# Patient Record
Sex: Female | Born: 1945 | Race: Black or African American | Hispanic: No | Marital: Single | Smoking: Former smoker
Health system: Southern US, Community
[De-identification: ages and names within clinical notes are randomized; demographics above are authoritative.]

## PROBLEM LIST (undated history)

## (undated) DIAGNOSIS — N841 Polyp of cervix uteri: Secondary | ICD-10-CM

## (undated) DIAGNOSIS — R221 Localized swelling, mass and lump, neck: Secondary | ICD-10-CM

## (undated) DIAGNOSIS — I2510 Atherosclerotic heart disease of native coronary artery without angina pectoris: Secondary | ICD-10-CM

## (undated) DIAGNOSIS — Z860100 Personal history of colon polyps, unspecified: Secondary | ICD-10-CM

## (undated) DIAGNOSIS — F4310 Post-traumatic stress disorder, unspecified: Secondary | ICD-10-CM

## (undated) DIAGNOSIS — I701 Atherosclerosis of renal artery: Secondary | ICD-10-CM

## (undated) DIAGNOSIS — I10 Essential (primary) hypertension: Secondary | ICD-10-CM

## (undated) DIAGNOSIS — G4733 Obstructive sleep apnea (adult) (pediatric): Secondary | ICD-10-CM

## (undated) DIAGNOSIS — D128 Benign neoplasm of rectum: Secondary | ICD-10-CM

## (undated) DIAGNOSIS — K429 Umbilical hernia without obstruction or gangrene: Secondary | ICD-10-CM

## (undated) DIAGNOSIS — E039 Hypothyroidism, unspecified: Secondary | ICD-10-CM

## (undated) DIAGNOSIS — F329 Major depressive disorder, single episode, unspecified: Secondary | ICD-10-CM

## (undated) DIAGNOSIS — R590 Localized enlarged lymph nodes: Secondary | ICD-10-CM

## (undated) DIAGNOSIS — N390 Urinary tract infection, site not specified: Secondary | ICD-10-CM

## (undated) DIAGNOSIS — E119 Type 2 diabetes mellitus without complications: Secondary | ICD-10-CM

## (undated) DIAGNOSIS — E785 Hyperlipidemia, unspecified: Secondary | ICD-10-CM

## (undated) DIAGNOSIS — G459 Transient cerebral ischemic attack, unspecified: Secondary | ICD-10-CM

## (undated) DIAGNOSIS — M549 Dorsalgia, unspecified: Secondary | ICD-10-CM

## (undated) DIAGNOSIS — F32A Depression, unspecified: Secondary | ICD-10-CM

## (undated) DIAGNOSIS — I639 Cerebral infarction, unspecified: Secondary | ICD-10-CM

## (undated) HISTORY — DX: Umbilical hernia without obstruction or gangrene: K42.9

## (undated) HISTORY — DX: Hypothyroidism, unspecified: E03.9

## (undated) HISTORY — DX: Essential (primary) hypertension: I10

## (undated) HISTORY — PX: UMBILICAL HERNIA REPAIR: SHX196

## (undated) HISTORY — DX: Post-traumatic stress disorder, unspecified: F43.10

## (undated) HISTORY — DX: Polyp of cervix uteri: N84.1

## (undated) HISTORY — DX: Obstructive sleep apnea (adult) (pediatric): G47.33

## (undated) HISTORY — DX: Cerebral infarction, unspecified: I63.9

## (undated) HISTORY — PX: ANKLE SURGERY: SHX546

## (undated) HISTORY — PX: APPENDECTOMY: SHX54

## (undated) HISTORY — DX: Atherosclerotic heart disease of native coronary artery without angina pectoris: I25.10

## (undated) HISTORY — DX: Localized enlarged lymph nodes: R59.0

## (undated) HISTORY — DX: Urinary tract infection, site not specified: N39.0

## (undated) HISTORY — PX: CARDIAC CATHETERIZATION: SHX172

## (undated) HISTORY — PX: CORONARY ANGIOPLASTY: SHX604

## (undated) HISTORY — DX: Atherosclerosis of renal artery: I70.1

## (undated) HISTORY — DX: Localized swelling, mass and lump, neck: R22.1

## (undated) HISTORY — DX: Benign neoplasm of rectum: D12.8

## (undated) HISTORY — DX: Hyperlipidemia, unspecified: E78.5

---
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
## 1996-02-05 HISTORY — PX: CORONARY STENT PLACEMENT: SHX1402

## 1998-09-14 ENCOUNTER — Ambulatory Visit (HOSPITAL_COMMUNITY): Admission: RE | Admit: 1998-09-14 | Discharge: 1998-09-14 | Payer: Self-pay | Admitting: Family Medicine

## 1998-09-14 ENCOUNTER — Encounter: Payer: Self-pay | Admitting: Family Medicine

## 1999-07-13 ENCOUNTER — Encounter: Payer: Self-pay | Admitting: Emergency Medicine

## 1999-07-13 ENCOUNTER — Inpatient Hospital Stay (HOSPITAL_COMMUNITY): Admission: EM | Admit: 1999-07-13 | Discharge: 1999-07-15 | Payer: Self-pay | Admitting: Emergency Medicine

## 1999-07-13 ENCOUNTER — Encounter: Payer: Self-pay | Admitting: Orthopedic Surgery

## 1999-09-07 ENCOUNTER — Encounter: Admission: RE | Admit: 1999-09-07 | Discharge: 1999-10-25 | Payer: Self-pay | Admitting: Orthopedic Surgery

## 2000-06-27 ENCOUNTER — Other Ambulatory Visit: Admission: RE | Admit: 2000-06-27 | Discharge: 2000-06-27 | Payer: Self-pay | Admitting: Family Medicine

## 2003-02-11 ENCOUNTER — Ambulatory Visit (HOSPITAL_COMMUNITY): Admission: RE | Admit: 2003-02-11 | Discharge: 2003-02-11 | Payer: Self-pay | Admitting: Family Medicine

## 2003-08-19 ENCOUNTER — Inpatient Hospital Stay (HOSPITAL_COMMUNITY): Admission: EM | Admit: 2003-08-19 | Discharge: 2003-08-22 | Payer: Self-pay | Admitting: Emergency Medicine

## 2003-09-07 ENCOUNTER — Encounter: Admission: RE | Admit: 2003-09-07 | Discharge: 2003-09-07 | Payer: Self-pay | Admitting: Internal Medicine

## 2003-09-21 ENCOUNTER — Encounter: Admission: RE | Admit: 2003-09-21 | Discharge: 2003-09-21 | Payer: Self-pay | Admitting: Internal Medicine

## 2003-10-16 ENCOUNTER — Emergency Department (HOSPITAL_COMMUNITY): Admission: EM | Admit: 2003-10-16 | Discharge: 2003-10-16 | Payer: Self-pay | Admitting: Emergency Medicine

## 2004-01-26 ENCOUNTER — Ambulatory Visit: Payer: Self-pay | Admitting: Internal Medicine

## 2004-01-26 ENCOUNTER — Inpatient Hospital Stay (HOSPITAL_COMMUNITY): Admission: AD | Admit: 2004-01-26 | Discharge: 2004-01-28 | Payer: Self-pay | Admitting: Internal Medicine

## 2004-02-15 ENCOUNTER — Ambulatory Visit: Payer: Self-pay | Admitting: Internal Medicine

## 2004-03-26 ENCOUNTER — Ambulatory Visit (HOSPITAL_COMMUNITY): Admission: RE | Admit: 2004-03-26 | Discharge: 2004-03-26 | Payer: Self-pay | Admitting: Cardiovascular Disease

## 2004-08-30 ENCOUNTER — Ambulatory Visit (HOSPITAL_COMMUNITY): Admission: RE | Admit: 2004-08-30 | Discharge: 2004-08-30 | Payer: Self-pay | Admitting: Internal Medicine

## 2004-08-30 ENCOUNTER — Ambulatory Visit: Payer: Self-pay | Admitting: Internal Medicine

## 2004-09-05 ENCOUNTER — Ambulatory Visit: Payer: Self-pay | Admitting: Internal Medicine

## 2004-09-05 ENCOUNTER — Ambulatory Visit (HOSPITAL_COMMUNITY): Admission: RE | Admit: 2004-09-05 | Discharge: 2004-09-05 | Payer: Self-pay | Admitting: Internal Medicine

## 2004-09-28 ENCOUNTER — Ambulatory Visit: Payer: Self-pay | Admitting: Internal Medicine

## 2004-10-02 ENCOUNTER — Ambulatory Visit: Payer: Self-pay | Admitting: Internal Medicine

## 2004-10-05 HISTORY — PX: COLONOSCOPY: SHX174

## 2004-10-24 ENCOUNTER — Encounter (INDEPENDENT_AMBULATORY_CARE_PROVIDER_SITE_OTHER): Payer: Self-pay | Admitting: *Deleted

## 2004-10-24 ENCOUNTER — Ambulatory Visit: Payer: Self-pay | Admitting: Internal Medicine

## 2004-10-24 DIAGNOSIS — Z86010 Personal history of colonic polyps: Secondary | ICD-10-CM | POA: Insufficient documentation

## 2004-10-24 DIAGNOSIS — D128 Benign neoplasm of rectum: Secondary | ICD-10-CM

## 2004-10-24 HISTORY — DX: Benign neoplasm of rectum: D12.8

## 2005-02-04 DIAGNOSIS — I701 Atherosclerosis of renal artery: Secondary | ICD-10-CM

## 2005-02-04 HISTORY — DX: Atherosclerosis of renal artery: I70.1

## 2005-02-04 HISTORY — PX: RENAL ARTERY STENT: SHX2321

## 2005-06-05 ENCOUNTER — Ambulatory Visit: Payer: Self-pay | Admitting: Internal Medicine

## 2005-06-05 ENCOUNTER — Encounter (INDEPENDENT_AMBULATORY_CARE_PROVIDER_SITE_OTHER): Payer: Self-pay | Admitting: Internal Medicine

## 2005-06-27 ENCOUNTER — Ambulatory Visit: Payer: Self-pay | Admitting: Obstetrics & Gynecology

## 2005-07-26 ENCOUNTER — Ambulatory Visit: Payer: Self-pay | Admitting: Internal Medicine

## 2005-11-18 ENCOUNTER — Encounter (INDEPENDENT_AMBULATORY_CARE_PROVIDER_SITE_OTHER): Payer: Self-pay | Admitting: Internal Medicine

## 2005-11-18 ENCOUNTER — Ambulatory Visit: Payer: Self-pay | Admitting: Internal Medicine

## 2005-11-18 LAB — CONVERTED CEMR LAB
Bilirubin Urine: NEGATIVE
Ketones, ur: NEGATIVE mg/dL
Nitrite: NEGATIVE
Protein, ur: 30 mg/dL — AB
Specific Gravity, Urine: 1.019 (ref 1.005–1.03)
TSH: 6.046 microintl units/mL — ABNORMAL HIGH (ref 0.350–5.50)
Urine Glucose: NEGATIVE mg/dL
Urobilinogen, UA: 0.2 (ref 0.0–1.0)
pH: 5.5 (ref 5.0–8.0)

## 2005-11-29 ENCOUNTER — Ambulatory Visit (HOSPITAL_COMMUNITY): Admission: RE | Admit: 2005-11-29 | Discharge: 2005-11-29 | Payer: Self-pay | Admitting: Internal Medicine

## 2005-12-03 ENCOUNTER — Ambulatory Visit: Payer: Self-pay | Admitting: Cardiology

## 2005-12-14 DIAGNOSIS — I701 Atherosclerosis of renal artery: Secondary | ICD-10-CM | POA: Insufficient documentation

## 2005-12-14 DIAGNOSIS — E039 Hypothyroidism, unspecified: Secondary | ICD-10-CM | POA: Insufficient documentation

## 2005-12-14 DIAGNOSIS — I10 Essential (primary) hypertension: Secondary | ICD-10-CM | POA: Insufficient documentation

## 2005-12-14 DIAGNOSIS — I2510 Atherosclerotic heart disease of native coronary artery without angina pectoris: Secondary | ICD-10-CM | POA: Insufficient documentation

## 2005-12-14 DIAGNOSIS — I25119 Atherosclerotic heart disease of native coronary artery with unspecified angina pectoris: Secondary | ICD-10-CM | POA: Insufficient documentation

## 2005-12-14 DIAGNOSIS — Z87891 Personal history of nicotine dependence: Secondary | ICD-10-CM | POA: Insufficient documentation

## 2005-12-14 DIAGNOSIS — Z8679 Personal history of other diseases of the circulatory system: Secondary | ICD-10-CM | POA: Insufficient documentation

## 2005-12-14 DIAGNOSIS — E785 Hyperlipidemia, unspecified: Secondary | ICD-10-CM | POA: Insufficient documentation

## 2005-12-14 DIAGNOSIS — N390 Urinary tract infection, site not specified: Secondary | ICD-10-CM | POA: Insufficient documentation

## 2005-12-20 ENCOUNTER — Inpatient Hospital Stay (HOSPITAL_COMMUNITY): Admission: EM | Admit: 2005-12-20 | Discharge: 2005-12-24 | Payer: Self-pay | Admitting: Emergency Medicine

## 2005-12-20 ENCOUNTER — Ambulatory Visit: Payer: Self-pay | Admitting: Cardiology

## 2006-01-13 ENCOUNTER — Encounter (INDEPENDENT_AMBULATORY_CARE_PROVIDER_SITE_OTHER): Payer: Self-pay | Admitting: Internal Medicine

## 2006-01-29 ENCOUNTER — Ambulatory Visit: Payer: Self-pay | Admitting: Cardiology

## 2006-02-03 ENCOUNTER — Ambulatory Visit: Payer: Self-pay | Admitting: Cardiology

## 2006-03-05 ENCOUNTER — Ambulatory Visit (HOSPITAL_BASED_OUTPATIENT_CLINIC_OR_DEPARTMENT_OTHER): Admission: RE | Admit: 2006-03-05 | Discharge: 2006-03-05 | Payer: Self-pay | Admitting: Cardiology

## 2006-03-11 ENCOUNTER — Ambulatory Visit: Payer: Self-pay | Admitting: Pulmonary Disease

## 2006-05-28 ENCOUNTER — Ambulatory Visit: Payer: Self-pay | Admitting: *Deleted

## 2006-05-28 LAB — CONVERTED CEMR LAB
ALT: 20 units/L (ref 0–40)
AST: 18 units/L (ref 0–37)
Albumin: 3.6 g/dL (ref 3.5–5.2)
Alkaline Phosphatase: 51 units/L (ref 39–117)
BUN: 13 mg/dL (ref 6–23)
Basophils Absolute: 0 10*3/uL (ref 0.0–0.1)
Basophils Relative: 0.2 % (ref 0.0–1.0)
Bilirubin, Direct: 0.1 mg/dL (ref 0.0–0.3)
CO2: 30 meq/L (ref 19–32)
Calcium: 9 mg/dL (ref 8.4–10.5)
Chloride: 105 meq/L (ref 96–112)
Cholesterol: 212 mg/dL (ref 0–200)
Creatinine, Ser: 1 mg/dL (ref 0.4–1.2)
Direct LDL: 148.4 mg/dL
Eosinophils Absolute: 0.1 10*3/uL (ref 0.0–0.6)
Eosinophils Relative: 2.3 % (ref 0.0–5.0)
GFR calc Af Amer: 73 mL/min
GFR calc non Af Amer: 61 mL/min
Glucose, Bld: 108 mg/dL — ABNORMAL HIGH (ref 70–99)
HCT: 36.1 % (ref 36.0–46.0)
HDL: 36.9 mg/dL — ABNORMAL LOW (ref >39.0)
Hemoglobin: 12.2 g/dL (ref 12.0–15.0)
Lymphocytes Relative: 46.7 % — ABNORMAL HIGH (ref 12.0–46.0)
MCHC: 33.8 g/dL (ref 30.0–36.0)
MCV: 84.8 fL (ref 78.0–100.0)
Monocytes Absolute: 0.3 10*3/uL (ref 0.2–0.7)
Monocytes Relative: 5.2 % (ref 3.0–11.0)
Neutro Abs: 2.8 10*3/uL (ref 1.4–7.7)
Neutrophils Relative %: 45.6 % (ref 43.0–77.0)
Platelets: 272 10*3/uL (ref 150–400)
Potassium: 3.6 meq/L (ref 3.5–5.1)
RBC: 4.26 M/uL (ref 3.87–5.11)
RDW: 14.1 % (ref 11.5–14.6)
Sodium: 141 meq/L (ref 135–145)
TSH: 7.96 microintl units/mL — ABNORMAL HIGH (ref 0.35–5.50)
Total Bilirubin: 0.9 mg/dL (ref 0.3–1.2)
Total CHOL/HDL Ratio: 5.7
Total Protein: 7 g/dL (ref 6.0–8.3)
Triglycerides: 125 mg/dL (ref 0–149)
VLDL: 25 mg/dL (ref 0–40)
WBC: 6.1 10*3/uL (ref 4.5–10.5)

## 2006-06-02 ENCOUNTER — Ambulatory Visit: Payer: Self-pay | Admitting: Hospitalist

## 2006-06-04 ENCOUNTER — Encounter (INDEPENDENT_AMBULATORY_CARE_PROVIDER_SITE_OTHER): Payer: Self-pay | Admitting: Internal Medicine

## 2006-06-06 ENCOUNTER — Encounter (INDEPENDENT_AMBULATORY_CARE_PROVIDER_SITE_OTHER): Payer: Self-pay | Admitting: Internal Medicine

## 2006-06-06 ENCOUNTER — Ambulatory Visit: Payer: Self-pay

## 2006-06-06 LAB — CONVERTED CEMR LAB
BUN: 11 mg/dL (ref 6–23)
CO2: 33 meq/L — ABNORMAL HIGH (ref 19–32)
Calcium: 9.1 mg/dL (ref 8.4–10.5)
Chloride: 105 meq/L (ref 96–112)
Creatinine, Ser: 0.9 mg/dL (ref 0.4–1.2)
GFR calc Af Amer: 83 mL/min
GFR calc non Af Amer: 68 mL/min
Glucose, Bld: 125 mg/dL — ABNORMAL HIGH (ref 70–99)
Potassium: 3.2 meq/L — ABNORMAL LOW (ref 3.5–5.1)
Sodium: 143 meq/L (ref 135–145)

## 2006-06-10 ENCOUNTER — Encounter: Admission: RE | Admit: 2006-06-10 | Discharge: 2006-06-10 | Payer: Self-pay | Admitting: Internal Medicine

## 2006-06-10 ENCOUNTER — Telehealth (INDEPENDENT_AMBULATORY_CARE_PROVIDER_SITE_OTHER): Payer: Self-pay | Admitting: Pharmacy Technician

## 2006-06-16 ENCOUNTER — Encounter (INDEPENDENT_AMBULATORY_CARE_PROVIDER_SITE_OTHER): Payer: Self-pay | Admitting: Internal Medicine

## 2006-06-16 DIAGNOSIS — R599 Enlarged lymph nodes, unspecified: Secondary | ICD-10-CM | POA: Insufficient documentation

## 2006-06-18 ENCOUNTER — Ambulatory Visit: Payer: Self-pay | Admitting: Cardiovascular Disease

## 2006-06-25 ENCOUNTER — Ambulatory Visit (HOSPITAL_COMMUNITY): Admission: RE | Admit: 2006-06-25 | Discharge: 2006-06-25 | Payer: Self-pay | Admitting: Internal Medicine

## 2006-06-25 ENCOUNTER — Encounter (INDEPENDENT_AMBULATORY_CARE_PROVIDER_SITE_OTHER): Payer: Self-pay | Admitting: Interventional Radiology

## 2006-06-25 ENCOUNTER — Encounter (INDEPENDENT_AMBULATORY_CARE_PROVIDER_SITE_OTHER): Payer: Self-pay | Admitting: Internal Medicine

## 2006-06-27 ENCOUNTER — Telehealth (INDEPENDENT_AMBULATORY_CARE_PROVIDER_SITE_OTHER): Payer: Self-pay | Admitting: Hospitalist

## 2006-07-07 ENCOUNTER — Telehealth (INDEPENDENT_AMBULATORY_CARE_PROVIDER_SITE_OTHER): Payer: Self-pay | Admitting: Pharmacy Technician

## 2006-07-11 ENCOUNTER — Encounter (INDEPENDENT_AMBULATORY_CARE_PROVIDER_SITE_OTHER): Payer: Self-pay | Admitting: Internal Medicine

## 2006-07-17 ENCOUNTER — Ambulatory Visit: Payer: Self-pay | Admitting: Cardiovascular Disease

## 2006-07-17 LAB — CONVERTED CEMR LAB
BUN: 14 mg/dL (ref 6–23)
CO2: 31 meq/L (ref 19–32)
Calcium: 9.2 mg/dL (ref 8.4–10.5)
Chloride: 103 meq/L (ref 96–112)
Creatinine, Ser: 0.8 mg/dL (ref 0.4–1.2)
GFR calc Af Amer: 95 mL/min
GFR calc non Af Amer: 78 mL/min
Glucose, Bld: 125 mg/dL — ABNORMAL HIGH (ref 70–99)
Potassium: 3.1 meq/L — ABNORMAL LOW (ref 3.5–5.1)
Sodium: 140 meq/L (ref 135–145)

## 2006-08-01 ENCOUNTER — Ambulatory Visit: Payer: Self-pay | Admitting: Internal Medicine

## 2006-08-01 ENCOUNTER — Encounter (INDEPENDENT_AMBULATORY_CARE_PROVIDER_SITE_OTHER): Payer: Self-pay | Admitting: Internal Medicine

## 2006-08-01 DIAGNOSIS — R221 Localized swelling, mass and lump, neck: Secondary | ICD-10-CM

## 2006-08-01 DIAGNOSIS — R220 Localized swelling, mass and lump, head: Secondary | ICD-10-CM | POA: Insufficient documentation

## 2006-08-03 ENCOUNTER — Encounter (INDEPENDENT_AMBULATORY_CARE_PROVIDER_SITE_OTHER): Payer: Self-pay | Admitting: Internal Medicine

## 2006-08-03 LAB — CONVERTED CEMR LAB: TSH: 7.935 microintl units/mL — ABNORMAL HIGH (ref 0.350–5.50)

## 2006-08-04 ENCOUNTER — Telehealth: Payer: Self-pay | Admitting: *Deleted

## 2006-08-21 ENCOUNTER — Telehealth (INDEPENDENT_AMBULATORY_CARE_PROVIDER_SITE_OTHER): Payer: Self-pay | Admitting: Pharmacy Technician

## 2006-10-13 ENCOUNTER — Telehealth (INDEPENDENT_AMBULATORY_CARE_PROVIDER_SITE_OTHER): Payer: Self-pay | Admitting: Pharmacy Technician

## 2006-10-15 ENCOUNTER — Ambulatory Visit: Payer: Self-pay | Admitting: Cardiovascular Disease

## 2006-10-15 LAB — CONVERTED CEMR LAB
ALT: 23 units/L (ref 0–35)
AST: 18 units/L (ref 0–37)
Albumin: 3.5 g/dL (ref 3.5–5.2)
Alkaline Phosphatase: 63 units/L (ref 39–117)
Bilirubin, Direct: 0.1 mg/dL (ref 0.0–0.3)
Cholesterol: 228 mg/dL (ref 0–200)
Direct LDL: 171.1 mg/dL
HDL: 28.3 mg/dL — ABNORMAL LOW (ref >39.0)
Total Bilirubin: 0.9 mg/dL (ref 0.3–1.2)
Total CHOL/HDL Ratio: 8.1
Total Protein: 7.3 g/dL (ref 6.0–8.3)
Triglycerides: 151 mg/dL — ABNORMAL HIGH (ref 0–149)
VLDL: 30 mg/dL (ref 0–40)

## 2006-12-05 ENCOUNTER — Ambulatory Visit: Payer: Self-pay

## 2007-01-26 ENCOUNTER — Ambulatory Visit: Payer: Self-pay | Admitting: Cardiovascular Disease

## 2007-01-26 LAB — CONVERTED CEMR LAB
ALT: 30 units/L (ref 0–35)
AST: 23 units/L (ref 0–37)
Albumin: 3.6 g/dL (ref 3.5–5.2)
Alkaline Phosphatase: 59 units/L (ref 39–117)
Bilirubin, Direct: 0.1 mg/dL (ref 0.0–0.3)
Cholesterol: 228 mg/dL (ref 0–200)
Direct LDL: 180.8 mg/dL
HDL: 30.5 mg/dL — ABNORMAL LOW (ref >39.0)
Total Bilirubin: 0.7 mg/dL (ref 0.3–1.2)
Total CHOL/HDL Ratio: 7.5
Total Protein: 7.5 g/dL (ref 6.0–8.3)
Triglycerides: 150 mg/dL — ABNORMAL HIGH (ref 0–149)
VLDL: 30 mg/dL (ref 0–40)

## 2007-02-25 ENCOUNTER — Telehealth (INDEPENDENT_AMBULATORY_CARE_PROVIDER_SITE_OTHER): Payer: Self-pay | Admitting: Internal Medicine

## 2007-02-27 ENCOUNTER — Emergency Department (HOSPITAL_COMMUNITY): Admission: EM | Admit: 2007-02-27 | Discharge: 2007-02-27 | Payer: Self-pay | Admitting: Emergency Medicine

## 2007-04-02 ENCOUNTER — Telehealth (INDEPENDENT_AMBULATORY_CARE_PROVIDER_SITE_OTHER): Payer: Self-pay | Admitting: Internal Medicine

## 2007-04-02 ENCOUNTER — Ambulatory Visit: Payer: Self-pay | Admitting: Cardiovascular Disease

## 2007-04-02 LAB — CONVERTED CEMR LAB
ALT: 23 units/L (ref 0–35)
AST: 17 units/L (ref 0–37)
Albumin: 3.5 g/dL (ref 3.5–5.2)
Alkaline Phosphatase: 54 units/L (ref 39–117)
BUN: 14 mg/dL (ref 6–23)
Bilirubin, Direct: 0.1 mg/dL (ref 0.0–0.3)
CO2: 32 meq/L (ref 19–32)
Calcium: 9 mg/dL (ref 8.4–10.5)
Chloride: 103 meq/L (ref 96–112)
Cholesterol: 210 mg/dL (ref 0–200)
Creatinine, Ser: 0.9 mg/dL (ref 0.4–1.2)
Direct LDL: 164 mg/dL
GFR calc Af Amer: 82 mL/min
GFR calc non Af Amer: 68 mL/min
Glucose, Bld: 130 mg/dL — ABNORMAL HIGH (ref 70–99)
HDL: 29.5 mg/dL — ABNORMAL LOW (ref >39.0)
Potassium: 3.3 meq/L — ABNORMAL LOW (ref 3.5–5.1)
Sodium: 141 meq/L (ref 135–145)
Total Bilirubin: 0.8 mg/dL (ref 0.3–1.2)
Total CHOL/HDL Ratio: 7.1
Total Protein: 7.4 g/dL (ref 6.0–8.3)
Triglycerides: 127 mg/dL (ref 0–149)
VLDL: 25 mg/dL (ref 0–40)

## 2007-04-24 ENCOUNTER — Telehealth (INDEPENDENT_AMBULATORY_CARE_PROVIDER_SITE_OTHER): Payer: Self-pay | Admitting: Pharmacy Technician

## 2007-05-07 ENCOUNTER — Encounter (INDEPENDENT_AMBULATORY_CARE_PROVIDER_SITE_OTHER): Payer: Self-pay | Admitting: Internal Medicine

## 2007-05-07 ENCOUNTER — Ambulatory Visit: Payer: Self-pay | Admitting: Hospitalist

## 2007-05-08 ENCOUNTER — Ambulatory Visit: Payer: Self-pay

## 2007-05-08 LAB — CONVERTED CEMR LAB
ALT: 21 units/L (ref 0–35)
AST: 17 units/L (ref 0–37)
Albumin: 4.4 g/dL (ref 3.5–5.2)
Alkaline Phosphatase: 77 units/L (ref 39–117)
BUN: 13 mg/dL (ref 6–23)
CO2: 25 meq/L (ref 19–32)
Calcium: 9.2 mg/dL (ref 8.4–10.5)
Chloride: 104 meq/L (ref 96–112)
Creatinine, Ser: 0.81 mg/dL (ref 0.40–1.20)
Glucose, Bld: 142 mg/dL — ABNORMAL HIGH (ref 70–99)
Potassium: 4.4 meq/L (ref 3.5–5.3)
Sodium: 140 meq/L (ref 135–145)
TSH: 8.496 microintl units/mL — ABNORMAL HIGH (ref 0.350–5.50)
Total Bilirubin: 0.4 mg/dL (ref 0.3–1.2)
Total Protein: 7.7 g/dL (ref 6.0–8.3)

## 2007-05-14 ENCOUNTER — Ambulatory Visit (HOSPITAL_COMMUNITY): Admission: RE | Admit: 2007-05-14 | Discharge: 2007-05-14 | Payer: Self-pay | Admitting: Internal Medicine

## 2007-05-19 ENCOUNTER — Telehealth (INDEPENDENT_AMBULATORY_CARE_PROVIDER_SITE_OTHER): Payer: Self-pay | Admitting: Pharmacy Technician

## 2007-06-14 ENCOUNTER — Encounter: Admission: RE | Admit: 2007-06-14 | Discharge: 2007-06-14 | Payer: Self-pay | Admitting: General Surgery

## 2007-06-23 ENCOUNTER — Encounter (INDEPENDENT_AMBULATORY_CARE_PROVIDER_SITE_OTHER): Payer: Self-pay | Admitting: Internal Medicine

## 2007-08-04 ENCOUNTER — Telehealth (INDEPENDENT_AMBULATORY_CARE_PROVIDER_SITE_OTHER): Payer: Self-pay | Admitting: Internal Medicine

## 2007-10-01 ENCOUNTER — Telehealth (INDEPENDENT_AMBULATORY_CARE_PROVIDER_SITE_OTHER): Payer: Self-pay | Admitting: Internal Medicine

## 2007-12-11 ENCOUNTER — Telehealth (INDEPENDENT_AMBULATORY_CARE_PROVIDER_SITE_OTHER): Payer: Self-pay | Admitting: *Deleted

## 2007-12-29 ENCOUNTER — Ambulatory Visit: Payer: Self-pay | Admitting: Cardiovascular Disease

## 2007-12-29 ENCOUNTER — Encounter (INDEPENDENT_AMBULATORY_CARE_PROVIDER_SITE_OTHER): Payer: Self-pay | Admitting: *Deleted

## 2008-01-15 ENCOUNTER — Encounter (INDEPENDENT_AMBULATORY_CARE_PROVIDER_SITE_OTHER): Payer: Self-pay | Admitting: *Deleted

## 2008-01-15 ENCOUNTER — Ambulatory Visit: Payer: Self-pay

## 2008-01-18 ENCOUNTER — Ambulatory Visit: Payer: Self-pay | Admitting: Cardiovascular Disease

## 2008-05-04 ENCOUNTER — Ambulatory Visit: Payer: Self-pay | Admitting: Internal Medicine

## 2008-05-04 ENCOUNTER — Encounter (INDEPENDENT_AMBULATORY_CARE_PROVIDER_SITE_OTHER): Payer: Self-pay | Admitting: *Deleted

## 2008-05-04 DIAGNOSIS — N898 Other specified noninflammatory disorders of vagina: Secondary | ICD-10-CM | POA: Insufficient documentation

## 2008-05-04 DIAGNOSIS — N841 Polyp of cervix uteri: Secondary | ICD-10-CM | POA: Insufficient documentation

## 2008-05-04 LAB — CONVERTED CEMR LAB
ALT: 20 units/L (ref 0–35)
AST: 16 units/L (ref 0–37)
Albumin: 4.1 g/dL (ref 3.5–5.2)
Alkaline Phosphatase: 67 units/L (ref 39–117)
BUN: 9 mg/dL (ref 6–23)
CO2: 27 meq/L (ref 19–32)
Calcium: 9.3 mg/dL (ref 8.4–10.5)
Chlamydia, DNA Probe: NEGATIVE
Chloride: 104 meq/L (ref 96–112)
Cholesterol: 194 mg/dL (ref 0–200)
Creatinine, Ser: 0.98 mg/dL (ref 0.40–1.20)
GC Probe Amp, Genital: NEGATIVE
GFR calc Af Amer: >60 mL/min (ref >60)
GFR calc non Af Amer: 58 mL/min — ABNORMAL LOW (ref >60)
Glucose, Bld: 109 mg/dL — ABNORMAL HIGH (ref 70–99)
HDL: 35 mg/dL — ABNORMAL LOW (ref >39)
LDL Cholesterol: 131 mg/dL — ABNORMAL HIGH (ref 0–99)
Potassium: 3.6 meq/L (ref 3.5–5.3)
Sodium: 140 meq/L (ref 135–145)
TSH: 8.544 microintl units/mL — ABNORMAL HIGH (ref 0.350–4.500)
Total Bilirubin: 0.4 mg/dL (ref 0.3–1.2)
Total CHOL/HDL Ratio: 5.5
Total Protein: 7.7 g/dL (ref 6.0–8.3)
Triglycerides: 138 mg/dL (ref <150)
VLDL: 28 mg/dL (ref 0–40)

## 2008-05-05 ENCOUNTER — Encounter (INDEPENDENT_AMBULATORY_CARE_PROVIDER_SITE_OTHER): Payer: Self-pay | Admitting: *Deleted

## 2008-05-05 LAB — CONVERTED CEMR LAB
Candida species: NEGATIVE
Gardnerella vaginalis: POSITIVE — AB
Trichomonal Vaginitis: NEGATIVE

## 2008-05-13 ENCOUNTER — Ambulatory Visit: Payer: Self-pay

## 2008-05-13 ENCOUNTER — Encounter (INDEPENDENT_AMBULATORY_CARE_PROVIDER_SITE_OTHER): Payer: Self-pay | Admitting: *Deleted

## 2008-05-17 ENCOUNTER — Ambulatory Visit: Payer: Self-pay | Admitting: Internal Medicine

## 2008-05-17 ENCOUNTER — Ambulatory Visit (HOSPITAL_COMMUNITY): Admission: RE | Admit: 2008-05-17 | Discharge: 2008-05-17 | Payer: Self-pay | Admitting: *Deleted

## 2008-05-17 LAB — CONVERTED CEMR LAB
OCCULT 1: NEGATIVE
OCCULT 2: NEGATIVE
OCCULT 3: NEGATIVE

## 2008-05-26 ENCOUNTER — Ambulatory Visit: Payer: Self-pay | Admitting: Internal Medicine

## 2008-05-27 ENCOUNTER — Inpatient Hospital Stay (HOSPITAL_COMMUNITY): Admission: EM | Admit: 2008-05-27 | Discharge: 2008-05-27 | Payer: Self-pay | Admitting: Emergency Medicine

## 2008-05-30 ENCOUNTER — Telehealth (INDEPENDENT_AMBULATORY_CARE_PROVIDER_SITE_OTHER): Payer: Self-pay | Admitting: *Deleted

## 2008-06-06 ENCOUNTER — Encounter (INDEPENDENT_AMBULATORY_CARE_PROVIDER_SITE_OTHER): Payer: Self-pay | Admitting: *Deleted

## 2008-06-06 ENCOUNTER — Ambulatory Visit: Payer: Self-pay | Admitting: Internal Medicine

## 2008-06-06 DIAGNOSIS — R5381 Other malaise: Secondary | ICD-10-CM | POA: Insufficient documentation

## 2008-06-06 DIAGNOSIS — R5383 Other fatigue: Secondary | ICD-10-CM

## 2008-06-06 LAB — CONVERTED CEMR LAB
HCT: 37.9 % (ref 36.0–46.0)
Hemoglobin: 12.3 g/dL (ref 12.0–15.0)
MCHC: 32.5 g/dL (ref 30.0–36.0)
MCV: 88.1 fL (ref 78.0–100.0)
Platelets: 275 10*3/uL (ref 150–400)
RBC: 4.3 M/uL (ref 3.87–5.11)
RDW: 14.1 % (ref 11.5–15.5)
Vitamin B-12: 733 pg/mL (ref 211–911)
WBC: 7.2 10*3/uL (ref 4.0–10.5)

## 2008-06-13 ENCOUNTER — Encounter (INDEPENDENT_AMBULATORY_CARE_PROVIDER_SITE_OTHER): Payer: Self-pay | Admitting: *Deleted

## 2008-06-22 ENCOUNTER — Ambulatory Visit: Payer: Self-pay | Admitting: Cardiology

## 2008-06-22 DIAGNOSIS — I10 Essential (primary) hypertension: Secondary | ICD-10-CM | POA: Insufficient documentation

## 2008-07-13 ENCOUNTER — Ambulatory Visit: Payer: Self-pay | Admitting: Obstetrics and Gynecology

## 2008-10-17 ENCOUNTER — Encounter (INDEPENDENT_AMBULATORY_CARE_PROVIDER_SITE_OTHER): Payer: Self-pay | Admitting: *Deleted

## 2008-12-12 ENCOUNTER — Emergency Department (HOSPITAL_COMMUNITY): Admission: EM | Admit: 2008-12-12 | Discharge: 2008-12-12 | Payer: Self-pay | Admitting: Family Medicine

## 2008-12-12 ENCOUNTER — Telehealth: Payer: Self-pay | Admitting: Internal Medicine

## 2008-12-21 ENCOUNTER — Ambulatory Visit: Payer: Self-pay | Admitting: Internal Medicine

## 2008-12-21 DIAGNOSIS — N8111 Cystocele, midline: Secondary | ICD-10-CM | POA: Insufficient documentation

## 2008-12-22 LAB — CONVERTED CEMR LAB
ALT: 19 units/L (ref 0–35)
AST: 14 units/L (ref 0–37)
Albumin: 4.2 g/dL (ref 3.5–5.2)
Alkaline Phosphatase: 56 units/L (ref 39–117)
BUN: 12 mg/dL (ref 6–23)
CO2: 29 meq/L (ref 19–32)
Calcium: 9 mg/dL (ref 8.4–10.5)
Chloride: 101 meq/L (ref 96–112)
Cholesterol: 167 mg/dL (ref 0–200)
Creatinine, Ser: 1.06 mg/dL (ref 0.40–1.20)
Glucose, Bld: 124 mg/dL — ABNORMAL HIGH (ref 70–99)
HDL: 33 mg/dL — ABNORMAL LOW (ref >39)
LDL Cholesterol: 103 mg/dL — ABNORMAL HIGH (ref 0–99)
Potassium: 3.8 meq/L (ref 3.5–5.3)
Sodium: 139 meq/L (ref 135–145)
TSH: 9.947 microintl units/mL — ABNORMAL HIGH (ref 0.350–4.5)
Total Bilirubin: 0.5 mg/dL (ref 0.3–1.2)
Total CHOL/HDL Ratio: 5.1
Total Protein: 7.2 g/dL (ref 6.0–8.3)
Triglycerides: 153 mg/dL — ABNORMAL HIGH (ref <150)
VLDL: 31 mg/dL (ref 0–40)

## 2008-12-28 ENCOUNTER — Telehealth: Payer: Self-pay | Admitting: Cardiovascular Disease

## 2009-01-06 ENCOUNTER — Ambulatory Visit: Payer: Self-pay | Admitting: Cardiovascular Disease

## 2009-01-06 ENCOUNTER — Encounter (INDEPENDENT_AMBULATORY_CARE_PROVIDER_SITE_OTHER): Payer: Self-pay

## 2009-01-12 ENCOUNTER — Ambulatory Visit (HOSPITAL_COMMUNITY): Admission: RE | Admit: 2009-01-12 | Discharge: 2009-01-12 | Payer: Self-pay | Admitting: Cardiovascular Disease

## 2009-01-12 ENCOUNTER — Ambulatory Visit: Payer: Self-pay | Admitting: Cardiovascular Disease

## 2009-01-16 ENCOUNTER — Telehealth (INDEPENDENT_AMBULATORY_CARE_PROVIDER_SITE_OTHER): Payer: Self-pay | Admitting: *Deleted

## 2009-02-09 ENCOUNTER — Ambulatory Visit: Payer: Self-pay | Admitting: Obstetrics and Gynecology

## 2009-02-09 ENCOUNTER — Encounter: Payer: Self-pay | Admitting: Internal Medicine

## 2009-05-17 ENCOUNTER — Ambulatory Visit: Payer: Self-pay

## 2009-05-17 ENCOUNTER — Encounter: Payer: Self-pay | Admitting: Cardiovascular Disease

## 2009-08-02 ENCOUNTER — Telehealth: Payer: Self-pay | Admitting: *Deleted

## 2009-08-11 ENCOUNTER — Ambulatory Visit: Payer: Self-pay | Admitting: Internal Medicine

## 2009-08-11 DIAGNOSIS — N949 Unspecified condition associated with female genital organs and menstrual cycle: Secondary | ICD-10-CM

## 2009-08-11 DIAGNOSIS — N925 Other specified irregular menstruation: Secondary | ICD-10-CM | POA: Insufficient documentation

## 2009-08-11 DIAGNOSIS — N938 Other specified abnormal uterine and vaginal bleeding: Secondary | ICD-10-CM | POA: Insufficient documentation

## 2009-08-14 ENCOUNTER — Encounter: Payer: Self-pay | Admitting: Internal Medicine

## 2009-08-16 ENCOUNTER — Ambulatory Visit: Payer: Self-pay | Admitting: Internal Medicine

## 2009-08-16 ENCOUNTER — Encounter: Payer: Self-pay | Admitting: Licensed Clinical Social Worker

## 2009-08-17 LAB — CONVERTED CEMR LAB
BUN: 14 mg/dL (ref 6–23)
CO2: 27 meq/L (ref 19–32)
Calcium: 9.1 mg/dL (ref 8.4–10.5)
Chloride: 104 meq/L (ref 96–112)
Cholesterol: 136 mg/dL (ref 0–200)
Creatinine, Ser: 0.92 mg/dL (ref 0.40–1.20)
Glucose, Bld: 138 mg/dL — ABNORMAL HIGH (ref 70–99)
HCT: 37 % (ref 36.0–46.0)
HDL: 35 mg/dL — ABNORMAL LOW (ref >39)
Hemoglobin: 11.5 g/dL — ABNORMAL LOW (ref 12.0–15.0)
LDL Cholesterol: 77 mg/dL (ref 0–99)
MCHC: 31.1 g/dL (ref 30.0–36.0)
MCV: 87.9 fL (ref >=78.0)
Platelets: 287 10*3/uL (ref 150–400)
Potassium: 3.8 meq/L (ref 3.5–5.3)
RBC: 4.21 M/uL (ref 3.87–5.11)
RDW: 14.4 % (ref 11.5–15.5)
Sodium: 142 meq/L (ref 135–145)
TSH: 4.863 microintl units/mL — ABNORMAL HIGH (ref 0.350–4.5)
Total CHOL/HDL Ratio: 3.9
Triglycerides: 119 mg/dL (ref <150)
VLDL: 24 mg/dL (ref 0–40)
WBC: 6 10*3/uL (ref 4.0–10.5)

## 2009-09-07 ENCOUNTER — Other Ambulatory Visit: Admission: RE | Admit: 2009-09-07 | Discharge: 2009-09-07 | Payer: Self-pay | Admitting: Obstetrics and Gynecology

## 2009-09-07 ENCOUNTER — Ambulatory Visit: Payer: Self-pay | Admitting: Obstetrics and Gynecology

## 2009-09-07 LAB — CONVERTED CEMR LAB: Pap Smear: NEGATIVE

## 2009-09-19 ENCOUNTER — Ambulatory Visit (HOSPITAL_COMMUNITY): Admission: RE | Admit: 2009-09-19 | Discharge: 2009-09-19 | Payer: Self-pay | Admitting: Family Medicine

## 2009-09-19 ENCOUNTER — Encounter (INDEPENDENT_AMBULATORY_CARE_PROVIDER_SITE_OTHER): Payer: Self-pay | Admitting: *Deleted

## 2009-10-12 ENCOUNTER — Ambulatory Visit: Payer: Self-pay | Admitting: Obstetrics & Gynecology

## 2009-12-20 ENCOUNTER — Encounter (INDEPENDENT_AMBULATORY_CARE_PROVIDER_SITE_OTHER): Payer: Self-pay | Admitting: *Deleted

## 2010-02-24 ENCOUNTER — Emergency Department (HOSPITAL_COMMUNITY)
Admission: EM | Admit: 2010-02-24 | Discharge: 2010-02-24 | Disposition: A | Discharge status: Other Acute Inpatient Hospital | Payer: Self-pay | Source: Home / Self Care | Admitting: Family Medicine

## 2010-02-24 ENCOUNTER — Emergency Department (HOSPITAL_COMMUNITY)
Admission: EM | Admit: 2010-02-24 | Discharge: 2010-02-24 | Payer: Self-pay | Source: Home / Self Care | Admitting: Emergency Medicine

## 2010-03-04 LAB — CONVERTED CEMR LAB
BUN: 9 mg/dL (ref 6–23)
Basophils Absolute: 0 10*3/uL (ref 0.0–0.1)
Basophils Relative: 0.7 % (ref 0.0–3.0)
CO2: 33 meq/L — ABNORMAL HIGH (ref 19–32)
Calcium: 8.7 mg/dL (ref 8.4–10.5)
Chloride: 101 meq/L (ref 96–112)
Creatinine, Ser: 0.8 mg/dL (ref 0.4–1.2)
Eosinophils Absolute: 0.3 10*3/uL (ref 0.0–0.7)
Eosinophils Relative: 4.6 % (ref 0.0–5.0)
GFR calc non Af Amer: 93.71 mL/min (ref >60)
Glucose, Bld: 115 mg/dL — ABNORMAL HIGH (ref 70–99)
HCT: 35.6 % — ABNORMAL LOW (ref 36.0–46.0)
Hemoglobin: 12 g/dL (ref 12.0–15.0)
INR: 1 (ref 0.8–1.0)
Lymphocytes Relative: 42.9 % (ref 12.0–46.0)
Lymphs Abs: 2.7 10*3/uL (ref 0.7–4.0)
MCHC: 33.7 g/dL (ref 30.0–36.0)
MCV: 88.3 fL (ref 78.0–100.0)
Monocytes Absolute: 0.5 10*3/uL (ref 0.1–1.0)
Monocytes Relative: 8.2 % (ref 3.0–12.0)
Neutro Abs: 2.9 10*3/uL (ref 1.4–7.7)
Neutrophils Relative %: 43.6 % (ref 43.0–77.0)
Platelets: 256 10*3/uL (ref 150.0–400.0)
Potassium: 3.3 meq/L — ABNORMAL LOW (ref 3.5–5.1)
Prothrombin Time: 10.2 s (ref 9.1–11.7)
RBC: 4.04 M/uL (ref 3.87–5.11)
RDW: 12.8 % (ref 11.5–14.6)
Sodium: 142 meq/L (ref 135–145)
WBC: 6.4 10*3/uL (ref 4.5–10.5)

## 2010-03-08 NOTE — Miscellaneous (Signed)
  Clinical Lists Changes  Medications: Changed medication from SYNTHROID 25 MCG TABS (LEVOTHYROXINE SODIUM) Take 1 tablet by mouth once a day to SYNTHROID 50 MCG  TABS (LEVOTHYROXINE SODIUM) Take 1 tablet by mouth once a day - Signed Rx of SYNTHROID 50 MCG  TABS (LEVOTHYROXINE SODIUM) Take 1 tablet by mouth once a day;  #31 x 3;  Signed;  Entered by: Peggye Pitt MD;  Authorized by: Peggye Pitt MD;  Method used: Telephoned to Orders: Added new Test order of T-TSH [phone]) - Signed

## 2010-03-08 NOTE — Assessment & Plan Note (Signed)
Summary: Social Work  Social Work.  Met with 65 year old single patient.  60 minutes.  Patient looking for health insurance options and drug assistance.  She does not qualify for any programs thru [facility].  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   She has multiple medical issues including history of stroke and CAD as well as hypertension, thyroid, and hyperlipidemia, renal stent.   She has her own home but has around $60,000 in debt from medical and hospitalization.   She is just making ends meet thru the daycare operation.  She has an adult daughter who can help her with applications and further decisionmaking.  However, she lives alone.   Today we explored the federal and state [employer] Option and reviewed the various coverages, deductibles and approximate monthly premiums.  The least expensive option was $276 per month for a $4,500 out of pocket deductible. She has the 866 number to apply for coverage however the monthly premium may be a barrier for her.   We also looked at her medication list and found that [facility] had everything except the Core[employer] thru [employer]. I explained to Ms. Lueth how to apply for [employer] and submit her financial information.  She also has the application.   Ms. Frontera would be eligible for early retirement social security in September and I've suggested she explore that option since she never applied for Dis[employer].  I've asked Ms. Souder to follow up with me in one month to make sure she can access resources.

## 2010-03-08 NOTE — Miscellaneous (Signed)
  Clinical Lists Changes 

## 2010-03-08 NOTE — Miscellaneous (Signed)
Summary: Orders Update:Thyroid u/s and bx//kg  Clinical Lists Changes    Pt scheduled for thyroid ultrasound and biospy for  Tues May 6th@ 1015 at [facility]  ph# [phone].  Pt instructed to hold her ASA three days prior to procedure and she will be going to the [address] location.  Orders faxed per Dr.Gherghe...................................................................Marland KitchenKrystal Eaton (AAMA)  June 04, 2006 1:15 PM <no value>

## 2010-03-08 NOTE — Progress Notes (Signed)
----   Converted from flag ---- ---- 08/02/2009 10:07 AM, Shon Hough wrote: Called patient yesterday and she sch an appointment for 08/11/2009 with [facility] Dr. Eben Burow and to see her PCP on 08/17/2009 Dr. Baltazar Apo.  Patient was really concerned and decided to just keep both appointments, and will call if anything gets worse.  ---- 07/31/2009 10:23 AM, Chinita Pester RN wrote:   ---- 07/28/2009 11:47 AM, Vassie Loll MD wrote: Ms Ousley called on June 24, complaining of vaginal spotting, no pain, no injury and she is not sexually active. Patient has a Pap smear done in May, 2010 and demonstrated no malignancy. She was seen by OB/GYN doctors due to cystocele w/o uterine prolapse and at that time was diagnosed with a cervical polyp. Patient will need to be seen on Monday for pelvic exam and to assess if she will need another OB/GYN referral. Please call patient with appointment details ASAP.  Thanks!!!!!! ------------------------------

## 2010-03-08 NOTE — Letter (Signed)
Summary: Cardiac Catheterization Instructions- Main Lab  [facility], Main Office  [address]   [place], [place] [address]   Phone: [phone]  Fax: [phone]     01/06/2009 MRN: [id number]  [name] [address] [place], [place]  [address]  Dear [name],   You are scheduled for Cardiac Catheterization on Thursday January 12, 2009 with Dr. Excell Seltzer.  Please arrive at the [facility] of [facility] at 10:00      a.m. on the day of your procedure.  1. DIET     _X___ Nothing to eat or drink after midnight except your medications with a sip of water.   2. MAKE SURE YOU TAKE YOUR ASPIRIN.  3. __X__ YOU MAY TAKE ALL of your remaining medications with a small amount of water.  4. Plan for one night stay - bring personal belongings (i.e. toothpaste, toothbrush, etc.)  5. Bring a current list of your medications and current insurance cards.  6. Must have a responsible person to drive you home.   7. Someone must be with you for the first 24 hours after you arrive home.  8. Please wear clothes that are easy to get on and off and wear slip-on shoes.  *Special note: Every effort is made to have your procedure done on time.  Occasionally there are emergencies that present themselves at the hospital that may cause delays.  Please be patient if a delay does occur.  If you have any questions after you get home, please call the office at the number listed above.  Julieta Gutting, RN, BSN

## 2010-03-08 NOTE — Progress Notes (Signed)
Summary: please verify medication so I can notify patient   Prescriptions: NORVASC 5 MG TABS (AMLODIPINE BESYLATE) Take 1 tablet by mouth once a day  #90 x 0   Entered and Authorized by:   Concepcion Elk   Signed by:   Eliseo Gum MD on 07/01/2006   Method used:   Samples Given   RxID:   [id number]   Patient Assist Medication Verification: Medication: Norvsc 5mg  Lot#    [id number] Exp Date: 1/12 Tech approval:NLS                Appended Document: please verify medication so I can notify patient Prescription/Samples picked up by: patient

## 2010-03-08 NOTE — Progress Notes (Signed)
Summary: Patient Assistance  Application mailed today for patient's synthroid and Norvasc today. Waiting on medication to arrive...................................................................Marland KitchenConcepcion Elk  April 24, 2007 10:36 AM

## 2010-03-08 NOTE — Progress Notes (Signed)
Summary: missed appt   Phone Note Call from Patient Call back at Home Phone [phone]   Caller: Patient Reason for Call: Talk to Nurse Summary of Call: pt missed appt yesterday... she thought today was the 23rd, pt states she needs to be seen, does not want Dr Excell Seltzer to drop her as a pt Initial call taken by: Migdalia Dk,  December 28, 2008 8:54 AM  Follow-up for Phone Call        This pt can be put on Dr Earmon Phoenix 01/06/09 schedule.  The pt has no showed x2, if the pt does not come to her next appointment then she may be discharged from the practice.  Please call and schedule this appointment. Follow-up by: Julieta Gutting, RN, BSN,  December 28, 2008 9:24 AM

## 2010-03-08 NOTE — Progress Notes (Signed)
Summary: Patient Assistance    Prescriptions: NORVASC 5 MG TABS (AMLODIPINE BESYLATE) Take 1 tablet by mouth once a day  #90 x 0   Entered and Authorized by:   Ned Grace MD   Signed by:   Ned Grace MD on 10/13/2006   Method used:   Samples Given   RxID:   [id number]   Patient Assist Medication Verification: Medication: Norvasc 5mg  Lot#    [id number] Exp Date: 01-12 Tech approval:NLS                   Call placed to patient with message that assistance medications are ready for pick-up. ..................................................................Marland KitchenConcepcion Elk  October 14, 2006 9:00 AM  Prescription/Samples picked up by: patient...................................................................Marland KitchenConcepcion Elk  December 25, 2006 9:01 AM

## 2010-03-08 NOTE — Assessment & Plan Note (Signed)
Summary: CHECKUP/ SB.   Vital Signs:  Patient Profile:   65 Years Old Female Height:     66 inches (167.64 cm) Weight:      211.5 pounds (96.14 kg) BMI:     34.26 Temp:     98.0 degrees F (36.67 degrees C) Pulse rate:   62 / minute BP sitting:   149 / 89  (right arm)  Pt. in pain?   no  Vitals Entered By: Filomena Jungling (May 07, 2007 9:33 AM)              Is Patient Diabetic? No Nutritional Status BMI of 25 - 29 = overweight  Does patient need assistance? Functional Status Self care Ambulation Normal     PCP:  Ardyth Harps  Chief Complaint:  check-up.  History of Present Illness: [name] id here today for a scheduled appointment. She had been worked up for what was thought to be a thyroid nodule and ended up being a Schwanomma. She has been starting to have difficulties with swallowing and notes she is snoring more. She does have a pretty significant lower cervical mass. She is wondering what the next step is.    Prior Medication List:  HYDROCHLOROTHIAZIDE 25 MG TABS (HYDROCHLOROTHIAZIDE) Take 1 tablet by mouth once a day NORVASC 5 MG TABS (AMLODIPINE BESYLATE) Take 1 tablet by mouth once a day ASPIRIN 81 MG TABS (ASPIRIN) Take 1 tablet by mouth once a day PRAVACHOL 20 MG TABS (PRAVASTATIN SODIUM) Take 1 tablet by mouth at bedtime SYNTHROID 50 MCG  TABS (LEVOTHYROXINE SODIUM) Take 1 tablet by mouth once a day LISINOPRIL-HYDROCHLOROTHIAZIDE 20-25 MG TABS (LISINOPRIL-HYDROCHLOROTHIAZIDE) Take 1 tablet by mouth once a day   Current Allergies: No known allergies     Risk Factors: Tobacco use:  quit    Year quit:  15-16 yrs ago   Review of Systems  The patient denies fever, weight loss, chest pain, dyspnea on exhertion, abdominal pain, melena, hematochezia, and severe indigestion/heartburn.     Physical Exam  General:     Well-developed,well-nourished,in no acute distress; alert,appropriate and cooperative throughout examination Neck:     No bruits. Large  sized lower cevical  mass towards the right side. Lungs:     Normal respiratory effort, chest expands symmetrically. Lungs are clear to auscultation, no crackles or wheezes. Heart:     Normal rate and regular rhythm. S1 and S2 normal without gallop, murmur, click, rub or other extra sounds.    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 Discussed with Dr. Aundria Rud.  Problem # 2:  RENAL ARTERY STENOSIS (ICD-440.1) Followed by Dr.Cooper. She is due for her 6 month renal US...will schedule today. Will also check renal function today. She is on an ACE-I.  Orders: Ultrasound (Ultrasound)   Problem # 3:  HYPOTHYROIDISM (ICD-244.9) TSH today.  Her updated medication list for this problem includes:    Synthroid 50 Mcg Tabs (Levothyroxine sodium) .Marland Kitchen... Take 1 tablet by mouth once a day  Orders: T-TSH ([id number])  Labs Reviewed: TSH: 7.935 (08/01/2006)    Chol: 210 (04/02/2007)   HDL: 29.5 (04/02/2007)   LDL: DEL (04/02/2007)   TG: 127 (04/02/2007)   Problem # 4:  HYPERTENSION (ICD-401.9) BP remains elevated. She started taking the norvasc just 2 weeks ago. Will not make any changes today. If her BP remains elevated at her return appointment in 1 month, could consider  increasing her norvasc to 10 mg.  Her updated medication list for this problem includes:    Hydrochlorothiazide 25 Mg Tabs (Hydrochlorothiazide) .Marland Kitchen... Take 1 tablet by mouth once a day    Norvasc 5 Mg Tabs (Amlodipine besylate) .Marland Kitchen... Take 1 tablet by mouth once a day    Lisinopril-hydrochlorothiazide 20-25 Mg Tabs (Lisinopril-hydrochlorothiazide) .Marland Kitchen... Take 1 tablet by mouth once a day  Orders: T-Comprehensive Metabolic Panel ([id number])  BP today: 149/89 Prior BP: 157/87 (08/01/2006)  Labs Reviewed: Creat: 0.9 (04/02/2007) Chol: 210 (04/02/2007)   HDL: 29.5 (04/02/2007)   LDL: DEL (04/02/2007)    TG: 127 (04/02/2007)   Problem # 5:  CORONARY ARTERY DISEASE (ICD-414.00) Stable.No chest pain or SOB. Followed by Dr.Cooper.  Her updated medication list for this problem includes:    Hydrochlorothiazide 25 Mg Tabs (Hydrochlorothiazide) .Marland Kitchen... Take 1 tablet by mouth once a day    Norvasc 5 Mg Tabs (Amlodipine besylate) .Marland Kitchen... Take 1 tablet by mouth once a day    Aspirin 81 Mg Tabs (Aspirin) .Marland Kitchen... Take 1 tablet by mouth once a day    Lisinopril-hydrochlorothiazide 20-25 Mg Tabs (Lisinopril-hydrochlorothiazide) .Marland Kitchen... Take 1 tablet by mouth once a day   Problem # 6:  HYPERLIPIDEMIA (ICD-272.4) Her pravastatin was changed to simvastatin in Feb/09 by Dr.Cooper.Checl repeat FLP next visit. Her updated medication list for this problem includes:    Simvastatin 40 Mg Tabs (Simvastatin) .Marland Kitchen... Take 1 tab by mouth at bedtime  Orders: T-Comprehensive Metabolic Panel [phone])  Labs Reviewed: Chol: 210 (04/02/2007)   HDL: 29.5 (04/02/2007)   LDL: DEL (04/02/2007)   TG: 127 (04/02/2007) SGOT: 17 (04/02/2007)   SGPT: 23 (04/02/2007)   Complete Medication List: 1)  Hydrochlorothiazide 25 Mg Tabs (Hydrochlorothiazide) .... Take 1 tablet by mouth once a day 2)  Norvasc 5 Mg Tabs (Amlodipine besylate) .... Take 1 tablet by mouth once a day 3)  Aspirin 81 Mg Tabs (Aspirin) .... Take 1 tablet by mouth once a day 4)  Simvastatin 40 Mg Tabs (Simvastatin) .... Take 1 tab by mouth at bedtime 5)  Synthroid 50 Mcg Tabs (Levothyroxine sodium) .... Take 1 tablet by mouth once a day 6)  Lisinopril-hydrochlorothiazide 20-25 Mg Tabs (Lisinopril-hydrochlorothiazide) .... Take 1 tablet by mouth once a day   Patient Instructions: 1)  Please schedule a follow-up appointment in 2 months.    ]

## 2010-03-08 NOTE — Assessment & Plan Note (Signed)
Summary: ACUTE-GROWTH ON NECK DR HAWTHRONE'S OFFICE FAXING ME OV'S/CFB...   Vital Signs:  Patient Profile:   65 Years Old Female Weight:      215.2 pounds (97.82 kg) Temp:     98.9 degrees F (37.17 degrees C) oral Pulse rate:   77 / minute BP sitting:   159 / 94  (right arm)  Pt. in pain?   no  Vitals Entered By: Krystal Eaton Duncan Dull) (June 02, 2006 9:42 AM)              Is Patient Diabetic? No Nutritional Status Normal  Have you ever been in a relationship where you felt threatened, hurt or afraid?No   Does patient need assistance? Functional Status Self care Ambulation Normal   Visit Type:  acute PCP:  Ardyth Harps  Chief Complaint:  enlarged neck ?goiter.  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 Urinary tract infection Tobacco abuse, hx of c/o goiter that was observed by Dr. Excell Seltzer [facility] cardiology). She thinks the goiter appeared last year but she did not pay attention to it. She could feel it when she turned her head or when she swallowed. No radiation of her neck. No familyh/o thyroid problem that she knows of.  She has problems with sleep (sleeps too much), does not drive too much b/c she is afraid that she might fall asleep. She had a sleep study b/c she snores a lot but was not considered to use a CPAP. Pt has low tolerance to cold.     Current Allergies (reviewed today): No known allergies     Risk Factors:  Tobacco use:  quit    Year quit:  15-16 yrs ago   Review of Systems       No pain at goiter site, but uncomfortable if she pushes on it.  No fever/chills/CP/back pain/swelling of legs/dysuria/abd pain/D/C Has N/acid reflux/palpitations yesterday   Physical Exam  General:     alert and  overweight-appearing.   Head:     normocephalic.   Eyes:     vision grossly intact and pupils equal.   Mouth:     denture,no gingival abnormalities and pharynx pink and moist.   Neck:     large thyroid noduleon the right and smaller one on the left; dense, non fluctuant, non painful Lungs:     normal respiratory effort, normal breath sounds, no crackles, and no wheezes.   Heart:     normal rate, regular rhythm, and no murmur.   Abdomen:     soft, non-tender, and normal bowel sounds.   Msk:     normal ROM.   Pulses:     R radial normal.   Extremities:     no edema Neurologic:     alert & oriented X3 and gait normal.   Skin:     turgor normal and color normal.   Psych:     Oriented X3, normally interactive, and good eye contact.      Impression & Recommendations:  Problem # 1:  HYPOTHYROIDISM (ICD-244.9) Pt has hypothyroidism and was supposed to be on Synthroid but says she did not know that she had this problem and obviously taking this medicine. I am going to start a low dose, 25 micrograms now. Her TSH recently checked at [facility] was  ~  7. She was also sent over here because of macroscopic thyroid goiter/nodules. There are at least 2 nodules: a bigger one on the right and a smaller one on the left. I am going to send her for a FNA of the right nodule.  Her updated medication list for this problem includes:    Synthroid 25 Mcg Tabs (Levothyroxine sodium) .Marland Kitchen... Take 1 tablet by mouth once a day  Future Orders: T-TSH ([id number]) ... 07/11/2006   Problem # 2:  HYPERTENSION (ICD-401.9) Pt's BP is high today, 159/94. I will repeat the measurement and if still high, we'll start low dose Norvasc and recheck her BP in 1 month.  On repeat, BP=154/90, so I will start Norvasc. She will try to arrange with [facility] for getting the Norvasc.  Her updated medication list for this problem includes:    Hydrochlorothiazide 25 Mg Tabs (Hydrochlorothiazide) .Marland Kitchen... Take 1 tablet by mouth  once a day    Norvasc 5 Mg Tabs (Amlodipine besylate) .Marland Kitchen... Take 1 tablet by mouth once a day    Lisinopril-hydrochlorothiazide 20-25 Mg Tabs (Lisinopril-hydrochlorothiazide) .Marland Kitchen... Take 1 tablet by mouth once a day   Medications Added to Medication List This Visit: 1)  Norvasc 5 Mg Tabs (Amlodipine besylate) .... Take 1 tablet by mouth once a day 2)  Synthroid 25 Mcg Tabs (Levothyroxine sodium) .... Take 1 tablet by mouth once a day 3)  Lisinopril-hydrochlorothiazide 20-25 Mg Tabs (Lisinopril-hydrochlorothiazide) .... Take 1 tablet by mouth once a day   Patient Instructions: 1)  Please schedule a follow-up appointment in 6 weeks for a thyroid check. 2)  Please follow your appointment for a biopsy of your thyroid. Prescriptions: NORVASC 5 MG TABS (AMLODIPINE BESYLATE) Take 1 tablet by mouth once a day  #30 x 5   Entered and Authorized by:   Carlus Pavlov MD   Signed by:   Carlus Pavlov MD on 06/02/2006   Method used:   Print then Give to Patient   RxID:   [id number] SYNTHROID 25 MCG TABS (LEVOTHYROXINE SODIUM) Take 1 tablet by mouth once a day  #30 x 1   Entered and Authorized by:   Carlus Pavlov MD   Signed by:   Carlus Pavlov MD on 06/02/2006   Method used:   Print then Give to Patient   RxID:   [id number]    Appended Document: ACUTE-GROWTH ON NECK DR HAWTHRONE'S OFFICE FAXING ME OV'S/CFB... Received call from radiology:  Ultrasound shows that the nodules are not in the thyroid.  They request permission to do CT of neck and chest with contrast.  If lymphadenopathy is confirmed, they will proceed to arrange a biopsy.  Permission granted.  Note: Creatinine = 0.9 on May 2.  Ulyess Mort

## 2010-03-08 NOTE — Miscellaneous (Signed)
Summary: HIPAA Restrictions  HIPAA Restrictions   Imported By: Florinda Marker 05/13/2008 14:47:48  _____________________________________________________________________  External Attachment:    Type:   Image     Comment:   External Document

## 2010-03-08 NOTE — Assessment & Plan Note (Signed)
Summary: CHECKUP/SB.   Vital Signs:  Patient profile:   65 year old female Height:      66 inches (167.64 cm) Weight:      214.3 pounds (97.41 kg) BMI:     34.71 Temp:     97.7 degrees F (36.50 degrees C) oral Pulse rate:   72 / minute BP sitting:   139 / 82  (right arm) Cuff size:   regular  Vitals Entered By: Krystal Eaton Duncan Dull) (May 04, 2008 11:00 AM) Is Patient Diabetic? No Pain Assessment Patient in pain? no      Nutritional Status BMI of > 30 = obese  Have you ever been in a relationship where you felt threatened, hurt or afraid?No   Does patient need assistance? Functional Status Self care Ambulation Normal Comments pt c/o increase appetite, vaginal discharge, not sleeping well, urine a different color   Primary Care Nycole Kawahara:  Ardyth Harps   History of Present Illness: Pt is 65 yo F w/ malignant HTN/ renal artery stenosis, HLD, CAD, who was last seen in our clinic 4/09, but had been seen by Dr. Excell Seltzer of [facility]  in interim, most recently on 01/22/08. Pt had myoview on 01/15/08 that showed a gated LVEF of 67% w/ no evidence of scar or ischemia. Pt on ASA 325, Lisinopril 20/25 HCTZ, Norvasc 5, Coreg 6.25 two times a day and Zocor 80 per there last note.  Pt presents with following issues: urinary dribbling (months, improved with muscle exercises), white vaginal discharge w/ odor (x1 week), denies itching, pain. No vaginal blood. No dysuria.  Last sexual activity was  ~ 15 years ago. Last PAP smear done many years ago. No weight loss. Pt actually reports increased appetite.   Pt taking medications as prescribed by Dr. Excell Seltzer, but it does not appear that she has been taking her synthroid as she states she takes just the medicines in her bag and this is the only one that is missing from our medication list.   Preventive Screening-Counseling & Management     Smoking Status: quit     Year Quit: 15-16 yrs ago  Allergies: No Known Drug Allergies  Review of  Systems       No weight loss (actually has increased appetite), no night sweats, no vaginal bleeding, pt does report increased flatulence and occasional nausea.  Physical Exam  General:  NAD Head:  [place]/AT Lungs:  normal respiratory effort.   Abdomen:  soft and non-tender.   Genitalia:  Vaginal exam done. White discharge in vaginal vault. No external lesions. No lesions on sides of vagina.  **2-3 mm red polypoid lesion protruding from cervical os along with small ulcer at 6 o'clock on cervix.**  Wet prep, HSV, G/C, and PAP sent off.  Bimanual exam non-revealing. No cervical motion tenderness. No uterine mass palpated. Neurologic:  alert & oriented X3 and cranial nerves II-XII intact.   Psych:  Appropriate   Impression & Recommendations:  Problem # 1:  CERVICAL POLYP (ICD-622.7)  Pt has not had PAP done in a number of years. No signs/ symptoms of cervical cancer - no weight loss, no vaginal bleeding. Will send pt to OB/GYN for cervical os polyp and await results of PAP and other studies.   Orders: Gynecologic Referral (Gyn) T-Pap Smear, Thin Prep (16109) T-Herpes Culture (Routine) ([id number])  Problem # 2:  HYPERTENSION (ICD-401.9) Pt hasn't been taking Norvasc for last couple of weeks but has been taking other meds and receives them from Dr. Excell Seltzer  of [facility]. Pt is just under goal today and recommended pt start back on Norvasc and hopefully she can receive this medication from [place] in our clinic.  Will check BMET given her meds and have removed HCTZ from her list as she is taking combo lisinopril-HCTZ.  The following medications were removed from the medication list:    Hydrochlorothiazide 25 Mg Tabs (Hydrochlorothiazide) .Marland Kitchen... Take 1 tablet by mouth once a day Her updated medication list for this problem includes:    Norvasc 5 Mg Tabs (Amlodipine besylate) .Marland Kitchen... Take 1 tablet by mouth once a day    Lisinopril-hydrochlorothiazide 20-25 Mg Tabs  (Lisinopril-hydrochlorothiazide) .Marland Kitchen... Take 1 tablet by mouth once a day    Coreg 6.25 Mg Tabs (Carvedilol) .Marland Kitchen... Take 1 tablet by mouth two times a day  BP today: 139/82 Prior BP: 149/89 (05/07/2007)  Labs Reviewed: K+: 4.4 (05/07/2007) Creat: : 0.81 (05/07/2007)   Chol: 210 (04/02/2007)   HDL: 29.5 (04/02/2007)   LDL: DEL (04/02/2007)   TG: 127 (04/02/2007)  Orders: T-Comprehensive Metabolic Panel ([id number])  Problem # 3:  HYPOTHYROIDISM (ICD-244.9) Pt not taking synthroid (or at least doesn't have it with her and says that she has all the medications that she takes with her, but not 100% sure). Provided pt with Synthroid Rx and checking TSH. Going to be difficult to interpret with pt off meds and will likely check at next appt if pt able to consistently take synthroid.  Her updated medication list for this problem includes:    Synthroid 50 Mcg Tabs (Levothyroxine sodium) .Marland Kitchen... Take 1 tablet by mouth once a day  Orders: T-TSH ([id number])  Problem # 4:  HYPERLIPIDEMIA (ICD-272.4) Pt fasting, so will check FLP as we don't have any results in about one year. Her updated medication list for this problem includes:    Zocor 80 Mg Tabs (Simvastatin) .Marland Kitchen... Take 1 tablet by mouth once a day  Labs Reviewed: SGOT: 17 (05/07/2007)   SGPT: 21 (05/07/2007)   HDL:29.5 (04/02/2007), 30.5 (01/26/2007)  LDL:DEL (04/02/2007), DEL (01/26/2007)  Chol:210 (04/02/2007), 228 (01/26/2007)  Trig:127 (04/02/2007), 150 (01/26/2007)  Her updated medication list for this problem includes:    Zocor 80 Mg Tabs (Simvastatin) .Marland Kitchen... Take 1 tablet by mouth once a day  Orders: T-Lipid Profile ([id number]) T-Comprehensive Metabolic Panel ([id number])  Problem # 5:  VAGINAL DISCHARGE (ICD-623.5)  White discharge most likely BV. Will await wet prep and then treat appropriately. Will also check for G/C, but unlikely given sexual history. Will check herpes culture with cervical ulceration. Will check HIV as pt  asking for this to be done and has vaginal complaints (although no sexual contact in 15 years).  Orders: T-Wet Prep by [facility] [phone]) T-Pap Smear, Thin Prep (02725) T-Chlamydia & GC Probe, Genital ([id number]) T-Herpes Culture (Routine) (36644-03474) T-HIV-1 (Screen) (25956)  Problem # 6:  Preventive Health Care (ICD-V70.0) PAP done today. Mammogram scheduled. Pt thinks she had colonoscopy around 2-3 years ago, but not sure. Gave pt hemoccult cards.  Complete Medication List: 1)  Norvasc 5 Mg Tabs (Amlodipine besylate) .... Take 1 tablet by mouth once a day 2)  [facility]vs Aspirin 325 Mg Tabs (Aspirin) .... Take 1 tablet by mouth once a day 3)  Zocor 80 Mg Tabs (Simvastatin) .... Take 1 tablet by mouth once a day 4)  Synthroid 50 Mcg Tabs (Levothyroxine sodium) .... Take 1 tablet by mouth once a day 5)  Lisinopril-hydrochlorothiazide 20-25 Mg Tabs (Lisinopril-hydrochlorothiazide) .... Take 1 tablet by mouth once a day  6)  Coreg 6.25 Mg Tabs (Carvedilol) .... Take 1 tablet by mouth two times a day  Other Orders: Mammogram (Screening) (Mammo) Future Orders: T-Hemoccult Card-Multiple (take home) (65784) ... 05/19/2008  Patient Instructions: 1)  Please schedule a follow-up appointment in 1 month. 2)  We will notify you of the results of your test today. We would like for you to see a gynecologist and will arrange an appointment. 3)  Please take your synthroid that has been sent over to [facility] 4)  Please take your other medications as prescribed. Please pick up your norvasc and take it as prescribed.  Prescriptions: SYNTHROID 50 MCG  TABS (LEVOTHYROXINE SODIUM) Take 1 tablet by mouth once a day  #31 x 11   Entered and Authorized by:   Linward Foster MD   Signed by:   Linward Foster MD on 05/04/2008   Method used:   Electronically to        [facility] (retail)       [address]       [place], [place]  [address]       Ph: [phone]        Fax: [phone]   RxID:   [id number] COREG 6.25 MG TABS (CARVEDILOL) Take 1 tablet by mouth two times a day  #60 x 0   Entered and Authorized by:   Linward Foster MD   Signed by:   Linward Foster MD on 05/04/2008   Method used:   Electronically to        [facility] (retail)       [address]       [place], [place]  [address]       Ph: [phone]       Fax: [phone]   RxID:   [id number]

## 2010-03-08 NOTE — Progress Notes (Signed)
Summary: ? Prolapsed Bladder  Phone Note From Other Clinic   Caller: Provider Summary of Call: Call from Dr. Audria Nine from Urgent Care.  Pt is there for a URI and is complaining of fullness and pressure on urination and defecation.  Would like for pt to get an appointment in the Clinics this week if possible.  Dr. Audria Nine was routed to [place] to schedule an appointment in the Clinics. Angelina Ok RN  December 12, 2008 2:31 PM  Initial call taken by: Angelina Ok RN,  December 12, 2008 2:31 PM  Follow-up for Phone Call        OK Follow-up by: Ulyess Mort MD,  December 12, 2008 3:04 PM

## 2010-03-08 NOTE — Progress Notes (Signed)
Summary: refill request/nls  Phone Note Refill Request  on February 25, 2007 11:02 AM  Refills Requested: Medication #1:  SYNTHROID 50 MCG  TABS Take 1 tablet by mouth once a day   Last Refilled: 01/06/2007  Follow-up for Phone Call        Refill approved-nurse to complete Follow-up by: Peggye Pitt MD,  February 26, 2007 10:49 AM  Additional Follow-up for Phone Call Additional follow up Details #1::        Rx called to pharmacy Additional Follow-up by: Concepcion Elk,  February 26, 2007 11:01 AM      Prescriptions: SYNTHROID 50 MCG  TABS (LEVOTHYROXINE SODIUM) Take 1 tablet by mouth once a day  #31 x 11   Entered and Authorized by:   Peggye Pitt MD   Signed by:   Peggye Pitt MD on 02/26/2007   Method used:   Telephoned to ...         RxID:   [id number]

## 2010-03-08 NOTE — Progress Notes (Signed)
Summary: Patient Assistance-Denial   Recieved a letter  regarding [name] Synthroid , application states that patient is Medicaid elligible. Will send letter to patient's home for her to apply for medicaid...................................................................Marland KitchenConcepcion Elk  May 19, 2007 11:21 AM

## 2010-03-08 NOTE — Miscellaneous (Signed)
Summary: Orders Update  Clinical Lists Changes  Problems: Added new problem of CERVICAL LYMPHADENOPATHY (ICD-785.6) Orders: Added new Test order of Ultrasound (Ultrasound) - Signed

## 2010-03-08 NOTE — Assessment & Plan Note (Signed)
Summary: RECK/EST/VS   Vital Signs:  Patient profile:   65 year old female Height:      66 inches (167.64 cm) Weight:      214.7 pounds (97.59 kg) BMI:     34.78 Temp:     97.2 degrees F (36.22 degrees C) oral Pulse rate:   63 / minute BP sitting:   145 / 84  (right arm)  Vitals Entered By: Filomena Jungling NT II (Jun 06, 2008 10:38 AM) CC: follow-up visit Is Patient Diabetic? No Pain Assessment Patient in pain? no      Nutritional Status BMI of > 30 = obese  Have you ever been in a relationship where you felt threatened, hurt or afraid?No   Does patient need assistance? Functional Status Self care Ambulation Normal   Primary Care Provider:  Ardyth Harps  CC:  follow-up visit.  History of Present Illness: Pt is 65 yo F w/ malignant HTN/ renal artery stenosis, HLD, CAD, who was last seen in our clinic by me 05/04/08. At that time, pelvic exam done which revealed 2 cm polyp on cervix. PAP negative. I had put in for a OB/GYN consult, but pt never called about it. Pt found to be + BV and given Rx for Flagyl. She took it and had resolution of white vaginal discharge. Pt has no other vaginal complaints - no tiching, bloody discharge. Pt not sexually active x 30 years.   Pt has new complaint of feeling fatigued during day despite adequate amount of sleep. Pt feels like she has decreased energy and has come close to falling a sleep whil watching after other's children. Pt never filled prescription for Synthroid at last visit. Pt denies any depression symptoms.  Otherwise, pt doing well and has no acute complaints. Pt referred for mammography and it came back BI-RADS 1.   Preventive Screening-Counseling & Management     Smoking Status: quit     Year Quit: 15-16 yrs ago  Allergies: No Known Drug Allergies  Review of Systems       No vaginal bleeding, wt loss or vaginal discharge.  Physical Exam  General:  alert.   Lungs:  normal respiratory effort, no crackles, and no  wheezes.   Heart:  normal rate, regular rhythm, no murmur, no gallop, and no rub.   Abdomen:  Soft, obese. Neurologic:  Non-focal Psych:  Appropriate.   Impression & Recommendations:  Problem # 1:  CERVICAL POLYP (ICD-622.7) Pt not seen yet by Ob/GYN, pt has not been called yet by them. Referral was delayed, so I have asked Mamie to follow-up on referal and make sure that pt able to go. I have communicated to pt the importance of making follow-up appt. The PAP smear was negative and pt has no systemic signs concerning for malignancy, but would like pt to be seen soon by specialist.  Problem # 2:  HYPERTENSION (ICD-401.9) Will go up on Norvasc to 10 mg daily. Will recheck on F/U.  Her updated medication list for this problem includes:    Norvasc 10 Mg Tabs (Amlodipine besylate) .Marland Kitchen... Take 1 tablet by mouth once a day    Lisinopril-hydrochlorothiazide 20-25 Mg Tabs (Lisinopril-hydrochlorothiazide) .Marland Kitchen... Take 1 tablet by mouth once a day    Coreg 6.25 Mg Tabs (Carvedilol) .Marland Kitchen... Take 1 tablet by mouth two times a day  BP today: 145/84 Prior BP: 139/82 (05/04/2008)  Labs Reviewed: K+: 3.6 (05/04/2008) Creat: : 0.98 (05/04/2008)   Chol: 194 (05/04/2008)   HDL: 35 (  05/04/2008)   LDL: 131 (05/04/2008)   TG: 138 (05/04/2008)  Problem # 3:  FATIGUE (ICD-780.79)  Denies depressive symptoms and appears to be getting enough sleep at night. Pt has been off of synthroid despite being hypothyroid. Have asked pt again to fill prescription. Will also check CBC and Vitamin B12 level, but most likely related to thyroid.  Orders: T-CBC No Diff ([id number]) T-Vitamin B12 (09811-91478)  Problem # 4:  VAGINAL DISCHARGE (ICD-623.5) Resolved w/ Flagyl prescription.  Problem # 5:  HYPOTHYROIDISM (ICD-244.9) Have asked pt again to have synthroid filled.  Her updated medication list for this problem includes:    Synthroid 50 Mcg Tabs (Levothyroxine sodium) .Marland Kitchen... Take 1 tablet by mouth once a  day  Complete Medication List: 1)  Norvasc 10 Mg Tabs (Amlodipine besylate) .... Take 1 tablet by mouth once a day 2)  [facility]vs Aspirin 325 Mg Tabs (Aspirin) .... Take 1 tablet by mouth once a day 3)  Zocor 80 Mg Tabs (Simvastatin) .... Take 1 tablet by mouth once a day 4)  Synthroid 50 Mcg Tabs (Levothyroxine sodium) .... Take 1 tablet by mouth once a day 5)  Lisinopril-hydrochlorothiazide 20-25 Mg Tabs (Lisinopril-hydrochlorothiazide) .... Take 1 tablet by mouth once a day 6)  Coreg 6.25 Mg Tabs (Carvedilol) .... Take 1 tablet by mouth two times a day  Patient Instructions: 1)  Please schedule a follow-up appointment in 2 months. 2)  Please see the gynecologist. We will schedule you an appointment soon. 3)  Please fill your synthroid prescription. 4)  We are increasing your dose of Norvasc from 5 to 10 mg a day. Please take two of your 5 mg tabs until they run out. Then please get the prescription for the 10 mg tabs filled. Prescriptions: NORVASC 10 MG TABS (AMLODIPINE BESYLATE) Take 1 tablet by mouth once a day  #30 x 3   Entered and Authorized by:   Linward Foster MD   Signed by:   Linward Foster MD on 06/06/2008   Method used:   Print then Give to Patient   RxID:   [id number]

## 2010-03-08 NOTE — Assessment & Plan Note (Signed)
Summary: ROV  Medications Added LISINOPRIL-HYDROCHLOROTHIAZIDE 20-25 MG TABS (LISINOPRIL-HYDROCHLOROTHIAZIDE) Take 1 tablet by mouth once a day      Allergies Added: NKDA  Visit Type:  Follow-up Primary Provider:  Ardyth Harps  CC:  Not sleeping good- Sob with walking and chest pain relief with nitro and asa.  History of Present Illness: This is a 65 year old woman with coronary artery disease and renal artery stenosis presenting for followup evaluation. She underwent cardiac catheterization in 2007 demonstrating severe diffuse right coronary artery stenosis with left to right collaterals. This was stable from her previous catheterization. She had mild nonobstructive disease in the LAD and left circumflex. She also underwent renal stenting at that time.  The patient complains of worsening chest pain. She has substernal chest pressure with exertion. She  denies resting symptoms. her chest pain is brought on at lower level activity that in the past. She periodically has chest discomfort with walking on level ground or with emotional stress. She describes the chest pain as a "pressure." This does not radiate to the arms jaw or back. There is no associated nausea, vomiting, or diaphoresis.  Current Medications (verified): 1)  Norvasc 10 Mg Tabs (Amlodipine Besylate) .... Take 1 Tablet By Mouth Once A Day 2)  Cvs Aspirin 325 Mg Tabs (Aspirin) .... Take 1 Tablet By Mouth Once A Day 3)  Zocor 80 Mg Tabs (Simvastatin) .... Take 1 Tablet By Mouth Once A Day 4)  Lisinopril-Hydrochlorothiazide 20-25 Mg Tabs (Lisinopril-Hydrochlorothiazide) .... Take 1 Tablet By Mouth Once A Day 5)  Carvedilol 12.5 Mg Tabs (Carvedilol) .... Take One Tablet By Mouth Twice A Day 6)  Levothyroxine Sodium 75 Mcg Tabs (Levothyroxine Sodium) .... Take 1 Tablet By Mouth Once A Day  Allergies (verified): No Known Drug Allergies  Past History:  Past medical history reviewed for relevance to current acute and chronic  problems.  Past Medical History: Reviewed history from 09/28/2008 and no changes required. HYPERTENSION, MALIGNANT, UNCONTROLLED (ICD-401.0) FATIGUE (ICD-780.79) PREVENTIVE HEALTH CARE (ICD-V70.0) CERVICAL POLYP (ICD-622.7) VAGINAL DISCHARGE (ICD-623.5) NECK MASS (ICD-784.2) CERVICAL LYMPHADENOPATHY (ICD-785.6) TOBACCO ABUSE, HX OF (ICD-V15.82) URINARY TRACT INFECTION (ICD-599.0) RENAL ARTERY STENOSIS (ICD-440.1) CEREBROVASCULAR ACCIDENT, HX OF (ICD-V12.50) HYPOTHYROIDISM (ICD-244.9) HYPERTENSION (ICD-401.9) HYPERLIPIDEMIA (ICD-272.4) CORONARY ARTERY DISEASE (ICD-414.00)  Review of Systems       Negative except as per HPI   Vital Signs:  Patient profile:   65 year old female Height:      64.5 inches Weight:      220.50 pounds BMI:     37.40 Pulse rate:   75 / minute Pulse rhythm:   regular Resp:     18 per minute BP sitting:   130 / 78  (left arm) Cuff size:   large  Vitals Entered By: Vikki Ports (January 06, 2009 2:13 PM)  Physical Exam  General:  Pt is alert and oriented, African American woman,  in no acute distress. HEENT: normal Neck: normal carotid upstrokes without bruits, JVP normal Lungs: CTA CV: RRR without murmur or gallop Abd: soft, NT, positive BS, no bruit, no organomegaly Ext: no clubbing, cyanosis, or edema. peripheral pulses 2+ and equal Skin: warm and dry without rash    EKG  Procedure date:  01/06/2009  Findings:      Normal sinus rhythm, T wave abnormality consider lateral ischemia, unchanged from previous tracings, heart rate 75 beats per minute.  Impression & Recommendations:  Problem # 1:  CORONARY ARTERY DISEASE (ICD-414.00) The patient has progressive angina, now with class III symptoms.  She has known coronary artery disease with last cardiac catheter in 2007. Because of her typical symptoms and known disease, I recommend proceed with cardiac catheterization to rule out progressive CAD. Risks and indications of the procedure  were reviewed with patient in detail she agrees to proceed. She will continue on her current medical program, which includes aspirin, beta-blockade, and an ACE inhibitor.  Her updated medication list for this problem includes:    Norvasc 10 Mg Tabs (Amlodipine besylate) .Marland Kitchen... Take 1 tablet by mouth once a day    Cvs Aspirin 325 Mg Tabs (Aspirin) .Marland Kitchen... Take 1 tablet by mouth once a day    Lisinopril-hydrochlorothiazide 20-25 Mg Tabs (Lisinopril-hydrochlorothiazide) .Marland Kitchen... Take 1 tablet by mouth once a day    Carvedilol 12.5 Mg Tabs (Carvedilol) .Marland Kitchen... Take one tablet by mouth twice a day  Orders: EKG w/ Interpretation (93000) Cardiac Catheterization (Cardiac Cath) TLB-BMP (Basic Metabolic Panel-BMET) (80048-METABOL) TLB-CBC Platelet - w/Differential (85025-CBCD) TLB-PT (Protime) (85610-PTP)  Problem # 2:  RENAL ARTERY STENOSIS (ICD-440.1) We'll perform abdominal aortic angiography at the time of her catheterization to assess for stent patency.  Problem # 3:  HYPERTENSION, MALIGNANT, UNCONTROLLED (ICD-401.0) Assessment: Unchanged  Her updated medication list for this problem includes:    Norvasc 10 Mg Tabs (Amlodipine besylate) .Marland Kitchen... Take 1 tablet by mouth once a day    [facility]vs Aspirin 325 Mg Tabs (Aspirin) .Marland Kitchen... Take 1 tablet by mouth once a day    Lisinopril-hydrochlorothiazide 20-25 Mg Tabs (Lisinopril-hydrochlorothiazide) .Marland Kitchen... Take 1 tablet by mouth once a day    Carvedilol 12.5 Mg Tabs (Carvedilol) .Marland Kitchen... Take one tablet by mouth twice a day  BP today: 130/78 Prior BP: 140/84 (12/21/2008)  Labs Reviewed: K+: 3.8 (12/21/2008) Creat: : 1.06 (12/21/2008)   Chol: 167 (12/21/2008)   HDL: 33 (12/21/2008)   LDL: 103 (12/21/2008)   TG: 153 (12/21/2008)  Patient Instructions: 1)  Your physician has requested that you have a cardiac catheterization.  Cardiac catheterization is used to diagnose and/or treat various heart conditions. Doctors may recommend this procedure for a number of different  reasons. The most common reason is to evaluate chest pain. Chest pain can be a symptom of coronary artery disease (CAD), and cardiac catheterization can show whether plaque is narrowing or blocking your heart's arteries. This procedure is also used to evaluate the valves, as well as measure the blood flow and oxygen levels in different parts of your heart.  For further information please visit [email or web address]  Please follow instruction sheet, as given. Prescriptions: LISINOPRIL-HYDROCHLOROTHIAZIDE 20-25 MG TABS (LISINOPRIL-HYDROCHLOROTHIAZIDE) Take 1 tablet by mouth once a day  #90 x 3   Entered by:   Julieta Gutting, RN, BSN   Authorized by:   Norva Karvonen, MD   Signed by:   Julieta Gutting, RN, BSN on 01/06/2009   Method used:   Electronically to        [phone] (retail)       [address]       [place], [place]  [address]       Ph: [phone]       Fax: [phone]   RxID:   [id number] NORVASC 10 MG TABS (AMLODIPINE BESYLATE) Take 1 tablet by mouth once a day  #90 x 3   Entered by:   Julieta Gutting, RN, BSN   Authorized by:   Norva Karvonen, MD   Signed by:   Julieta Gutting, RN, BSN on  01/06/2009   Method used:   Electronically to        [id number] (retail)       [address]       [place], [place]  [address]       Ph: [phone]       Fax: [phone]   RxID:   [id number]

## 2010-03-08 NOTE — Progress Notes (Signed)
Summary: Patient Assistance     Patient Assist Medication Verification: Medication: NOrvasc 2.5mg  Lot# [id number] Exp Date:01-12 Tech approval:NLS  patient instructed to take two pills once daily to equal (5mg ) ..................................................................Marland KitchenConcepcion Elk  April 02, 2007 11:31 AM

## 2010-03-08 NOTE — Progress Notes (Signed)
Summary: medication picked up   Prescriptions: SYNTHROID 25 MCG TABS (LEVOTHYROXINE SODIUM) Take 1 tablet by mouth once a day  #100 x 0   Entered and Authorized by:   Margarito Liner MD   Signed by:   Margarito Liner MD on 07/07/2006   Method used:   Samples Given   RxID:   [id number]   Patient Assist Medication Verification: Medication: Synthroid 0..025mg  Lot# [id number] Exp Date: 01/15/07 Tech approval: NLS Prescription/Samples picked up by: patient ..................................................................Marland KitchenConcepcion Elk  July 09, 2006 2:22 PM  Prescription/Samples picked up by: patient

## 2010-03-08 NOTE — Miscellaneous (Signed)
Summary: update med  Clinical Lists Changes  Medications: Added new medication of NITROSTAT 0.4 MG SUBL (NITROGLYCERIN) 1 tablet under tongue at onset of chest pain; you may repeat every 5 minutes for up to 3 doses. 

## 2010-03-08 NOTE — Consult Note (Signed)
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

## 2010-03-08 NOTE — Assessment & Plan Note (Signed)
Summary: ACUTE-VAGINAL SPOTTING /PER DR MADERA/[facility]   Vital Signs:  Patient profile:   65 year old female Height:      64.5 inches Weight:      216.1 pounds BMI:     36.65 Temp:     97.8 degrees F oral Pulse rate:   65 / minute BP sitting:   156 / 89  (right arm) Cuff size:   regular  Vitals Entered By: Theotis Barrio NT II (August 11, 2009 10:29 AM) CC: PATIENT STATES THAT SHE HAS HAD SOME VAGINAL BLEED-SPOTTING SINCE LAST WEEK. /MEDICATION REFILL Is Patient Diabetic? No Pain Assessment Patient in pain? no      Nutritional Status BMI of > 30 = obese  Does patient need assistance? Functional Status Self care Ambulation Normal Comments VAGINAL SPOTTING LAST WEEK /  MEDICATION REFILL   Primary Care Provider:  Melida Quitter MD  CC:  PATIENT STATES THAT SHE HAS HAD SOME VAGINAL BLEED-SPOTTING SINCE LAST WEEK. /MEDICATION REFILL.  History of Present Illness: Ms shader is 65 year old relatively healthy women with PMH as described in EMR is here today with cheif c/o vaginal spotting  She started noticing blood 2 weeks ago, small in amount, she does not have change any pads during a day, stopped spontaneously 2 days ago but she is scared. No change in appetite, weight loss, abdominal pain, change in bowel or urinary habits. She still has her uterus intact and this is first time since menopause that she has noticed bleeding.  her BP is somewhat high today but repeat BP was 142/88.  She needs her meds refilled.  No other complaints.  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 Take 1 Tablet By Mouth Once A Day 2)  Cvs Aspirin 325 Mg Tabs (Aspirin) .... Take 1 Tablet By Mouth Once A Day 3)  Zocor 80 Mg Tabs (Simvastatin) .... Take 1 Tablet By Mouth Once A Day 4)  Lisinopril-Hydrochlorothiazide 20-25 Mg Tabs (Lisinopril-Hydrochlorothiazide) .... Take 1 Tablet By Mouth Once A Day 5)  Carvedilol 12.5 Mg Tabs (Carvedilol) .... Take One Tablet By Mouth Twice A Day 6)  Levothyroxine Sodium 75 Mcg Tabs (Levothyroxine Sodium) .... Take 1 Tablet By Mouth Once A Day 7)  Potassium Chloride Cr 10 Meq Cr-Caps (Potassium Chloride) .... Take One Tablet By Mouth Daily  Current Medications (verified): 1)  Norvasc 10 Mg Tabs (Amlodipine Besylate) .... Take 1 Tablet By Mouth Once A Day 2)  Cvs Aspirin 325 Mg  Tabs (Aspirin) .... Take 1 Tablet By Mouth Once A Day 3)  Lisinopril-Hydrochlorothiazide 20-25 Mg Tabs (Lisinopril-Hydrochlorothiazide) .... Take 1 Tablet By Mouth Once A Day 4)  Carvedilol 12.5 Mg Tabs (Carvedilol) .... Take One Tablet By Mouth Twice A Day 5)  Levothyroxine Sodium 75 Mcg Tabs (Levothyroxine Sodium) .... Take 1 Tablet By Mouth Once A Day 6)  Potassium Chloride Cr 10 Meq Cr-Caps (Potassium Chloride) .... Take One Tablet By Mouth Daily 7)  Simvastatin 40 Mg Tabs (Simvastatin) .... Take 1 Tablet By Mouth Once A Day  Allergies (verified): No Known Drug Allergies  Past History:  Past Medical  History: Last updated: 10/10/2008 HYPERTENSION, MALIGNANT, UNCONTROLLED (ICD-401.0) FATIGUE (ICD-780.79) PREVENTIVE HEALTH CARE (ICD-V70.0) CERVICAL POLYP (ICD-622.7) VAGINAL DISCHARGE (ICD-623.5) NECK MASS (ICD-784.2) CERVICAL LYMPHADENOPATHY (ICD-785.6) TOBACCO ABUSE, HX OF (ICD-V15.82) URINARY TRACT INFECTION (ICD-599.0) RENAL ARTERY STENOSIS (ICD-440.1) CEREBROVASCULAR ACCIDENT, HX OF (ICD-V12.50) HYPOTHYROIDISM (ICD-244.9) HYPERTENSION (ICD-401.9) HYPERLIPIDEMIA (ICD-272.4) CORONARY ARTERY DISEASE (ICD-414.00)  Past Surgical History: Last updated: 10/10/08 Cardiac Cath [phone]  Family History: Last updated: 2008-10-10   Father died of Alzheimer disease. Mother has hypertension,  osteoarthritis, and is otherwise well. There is no history of stroke or cancer in the family.  Social History: Last updated: Oct 10, 2008  She lives in [place].  She does not smoke.      Risk Factors: Exercise: yes (08/11/2009)  Risk Factors: Smoking Status: quit (08/11/2009)  Family History: Reviewed history from October 10, 2008 and no changes required.   Father died of Alzheimer disease. Mother has hypertension,  osteoarthritis, and is otherwise well. There is no history of stroke or cancer in the family.  Social History: Reviewed history from 2008/10/10 and no changes required.  She lives in [place].  She does not smoke.    Does Patient Exercise:  yes  Review of Systems      See HPI  Physical Exam  Additional Exam:  Gen: AOx3, in no acute distress Eyes: PERRL, EOMI ENT:MMM, No erythema noted in posterior pharynx Neck: No JVD, No LAP Chest: CTAB with  good respiratory effort CVS: regular rhythmic rate, NO M/R/G, S1 S2 normal Abdo: soft,ND, BS+x4, Non tender and No hepatosplenomegaly EXT: No odema noted Neuro: Non focal, gait is normal Skin: no rashes noted.    Impression & Recommendations:  Problem # 1:  DYSFUNCTIONAL UTERINE BLEEDING (ICD-626.8) Assessment  New Gyn referral, transvaginal ultrasound to evaluate her uterus and sourse of bleeding. I will also check CBC to evaluate her HBG level. Orders: Gynecologic Referral (Gyn) Ultrasound (Ultrasound)Future Orders: T-CBC No Diff ([id number]) ... 08/14/2009  Problem # 2:  HYPERTENSION, MALIGNANT, UNCONTROLLED (ICD-401.0) Assessment: Unchanged Patient's repeat BP was 142/88. I will not change her meds today. Her updated medication list for this problem includes:    Norvasc 10 Mg Tabs (Amlodipine besylate) .Marland Kitchen... Take 1 tablet by mouth once a day    Lisinopril-hydrochlorothiazide 20-25 Mg Tabs (Lisinopril-hydrochlorothiazide) .Marland Kitchen... Take 1 tablet by mouth once a day    Carvedilol 12.5 Mg Tabs (Carvedilol) .Marland Kitchen... Take one tablet by mouth twice a day  BP today: 156/89 Prior BP: 130/78 (01/06/2009)  Labs Reviewed: K+: 3.3 (01/06/2009) Creat: : 0.8 (01/06/2009)   Chol: 167 (12/21/2008)   HDL: 33 (12/21/2008)   LDL: 103 (12/21/2008)   TG: 153 (12/21/2008)  Problem # 3:  PREVENTIVE HEALTH CARE (ICD-V70.0) Assessment: Comment Only Will check LDL.  Problem # 4:  HYPERLIPIDEMIA (ICD-272.4) Assessment: Comment Only Change simvastatin 80 to 40 mg according to the latest recs from FDA. The following medications were removed from  the medication list:    Zocor 80 Mg Tabs (Simvastatin) .Marland Kitchen... Take 1 tablet by mouth once a day Her updated medication list for this problem includes:    Simvastatin 40 Mg Tabs (Simvastatin) .Marland Kitchen... Take 1 tablet by mouth once a day  Future Orders: T-Lipid Profile ([id number]) ... 08/14/2009  Labs Reviewed: SGOT: 14 (12/21/2008)   SGPT: 19 (12/21/2008)   HDL:33 (12/21/2008), 35 (05/04/2008)  LDL:103 (12/21/2008), 131 (05/04/2008)  Chol:167 (12/21/2008), 194 (05/04/2008)  Trig:153 (12/21/2008), 138 (05/04/2008)  Complete Medication List: 1)  Norvasc 10 Mg Tabs (Amlodipine besylate) .... Take 1 tablet by mouth once a day 2)  Cvs Aspirin 325 Mg Tabs (Aspirin) .... Take 1  tablet by mouth once a day 3)  Lisinopril-hydrochlorothiazide 20-25 Mg Tabs (Lisinopril-hydrochlorothiazide) .... Take 1 tablet by mouth once a day 4)  Carvedilol 12.5 Mg Tabs (Carvedilol) .... Take one tablet by mouth twice a day 5)  Levothyroxine Sodium 75 Mcg Tabs (Levothyroxine sodium) .... Take 1 tablet by mouth once a day 6)  Potassium Chloride Cr 10 Meq Cr-caps (Potassium chloride) .... Take one tablet by mouth daily 7)  Simvastatin 40 Mg Tabs (Simvastatin) .... Take 1 tablet by mouth once a day  Other Orders: Future Orders: T-Basic Metabolic Panel [id number]) ... 08/14/2009 T-TSH [id number]) ... 08/14/2009  Patient Instructions: 1)  Please schedule a follow-up appointment in 3 months. 2)  Please schedule a follow-up appointment as needed. 3)  Limit your Sodium (Salt). 4)  It is important that you exercise regularly at least 20 minutes 5 times a week. If you develop chest pain, have severe difficulty breathing, or feel very tired , stop exercising immediately and seek medical attention. 5)  You need to lose weight. Consider a lower calorie diet and regular exercise.  6)  Take an Aspirin every day. 7)  Check your Blood Pressure regularly. If it is above:140/90 you should make an appointment. Prescriptions: SIMVASTATIN 40 MG TABS (SIMVASTATIN) Take 1 tablet by mouth once a day  #31 x 11   Entered and Authorized by:   Lars Mage MD   Signed by:   Lars Mage MD on 08/11/2009   Method used:   Print then Give to Patient   RxID:   [id number] CARVEDILOL 12.5 MG TABS (CARVEDILOL) Take one tablet by mouth twice a day  #60 x 6   Entered and Authorized by:   Lars Mage MD   Signed by:   Lars Mage MD on 08/11/2009   Method used:   Print then Give to Patient   RxID:   [id number] LEVOTHYROXINE SODIUM 75 MCG TABS (LEVOTHYROXINE SODIUM) Take 1 tablet by mouth once a day  #90 Not Speci x 3   Entered and Authorized by:   Lars Mage MD   Signed by:   Lars Mage MD on 08/11/2009    Method used:   Print then Give to Patient   RxID:   [id number] LISINOPRIL-HYDROCHLOROTHIAZIDE 20-25 MG TABS (LISINOPRIL-HYDROCHLOROTHIAZIDE) Take 1 tablet by mouth once a day  #90 Tablet x 11   Entered and Authorized by:   Lars Mage MD   Signed by:   Lars Mage MD on 08/11/2009   Method used:   Print then Give to Patient   RxID:   [id number] NORVASC 10 MG TABS (AMLODIPINE BESYLATE) Take 1 tablet by mouth once a day  #90 x 3   Entered and Authorized by:   Lars Mage MD   Signed by:   Lars Mage MD on 08/11/2009  Method used:   Print then Give to Patient   RxID:   [id number]  Process Orders Check Orders Results:     [facility]: ABN not required for this insurance Tests Sent for requisitioning (August 11, 2009 1:17 PM):     08/14/2009: [facility] -- T-Basic Metabolic Panel [id number] (signed)     08/14/2009: [facility] -- T-Lipid Profile [id number] (signed)     08/14/2009: [facility] -- T-CBC No Diff [[id number]] (signed)     08/14/2009: [facility] -- T-TSH [id number] (signed)    Prevention & Chronic Care Immunizations   Influenza vaccine: Not documented   Influenza vaccine deferral: Deferred  (08/11/2009)    Tetanus booster: Not documented   Td booster deferral: Deferred  (08/11/2009)    Pneumococcal vaccine: Not documented    H. zoster vaccine: Not documented   H. zoster vaccine deferral: Deferred  (08/11/2009)  Colorectal Screening   Hemoccult: Not documented   Hemoccult action/deferral: Deferred  (08/11/2009)    Colonoscopy: Not documented   Colonoscopy action/deferral: Deferred  (08/11/2009)  Other Screening   Pap smear: NEGATIVE FOR INTRAEPITHELIAL LESIONS OR MALIGNANCY.  (05/04/2008)    Mammogram: ASSESSMENT: Negative - BI-RADS 1^MS DIGITAL SCREENING  (05/17/2008)   Mammogram action/deferral: Deferred-2 yr interval  (08/11/2009)    DXA bone density  scan: Not documented   Smoking status: quit  (08/11/2009)  Lipids   Total Cholesterol: 167  (12/21/2008)   LDL: 103  (12/21/2008)   LDL Direct: 164.0  (04/02/2007)   HDL: 33  (12/21/2008)   Triglycerides: 153  (12/21/2008)    SGOT (AST): 14  (12/21/2008)   SGPT (ALT): 19  (12/21/2008)   Alkaline phosphatase: 56  (12/21/2008)   Total bilirubin: 0.5  (12/21/2008)    Lipid flowsheet reviewed?: Yes   Progress toward LDL goal: Unchanged  Hypertension   Last Blood Pressure: 156 / 89  (08/11/2009)   Serum creatinine: 0.8  (01/06/2009)   Serum potassium 3.3  (01/06/2009)    Hypertension flowsheet reviewed?: Yes   Progress toward BP goal: At goal  Self-Management Support :   Personal Goals (by the next clinic visit) :      Personal blood pressure goal: 140/90  (08/11/2009)     Personal LDL goal: 100  (08/11/2009)    Patient will work on the following items until the next clinic visit to reach self-care goals:     Medications and monitoring: take my medicines every day, weigh myself weekly  (08/11/2009)     Eating: drink diet soda or water instead of juice or soda, eat more vegetables, eat foods that are low in salt, eat baked foods instead of fried foods  (08/11/2009)     Activity: take a 30 minute walk every day  (08/11/2009)    Hypertension self-management support: Resources for patients handout, Written self-care plan  (08/11/2009)   Hypertension self-care plan printed.    Lipid self-management support: Resources for patients handout, Written self-care plan  (08/11/2009)   Lipid self-care plan printed.      Resource handout printed.   Process Orders Check Orders Results:     [facility]: ABN not required for this insurance Tests Sent for requisitioning (August 11, 2009 1:17 PM):     08/14/2009: [facility] -- T-Basic Metabolic Panel [id number] (signed)     08/14/2009: [facility] -- T-Lipid Profile [id number]  (signed)     08/14/2009: [facility] -- T-CBC No Diff [[id number]] (signed)  08/14/2009: [facility] -- T-TSH [phone] (signed)

## 2010-03-08 NOTE — Assessment & Plan Note (Signed)
Summary: urgent care-abd pain-defecating on herself per Dr. Mcpherson/[facility]b   Vital Signs:  Patient profile:   65 year old female Height:      64.5 inches Weight:      2220.3 pounds BMI:     376.58 Temp:     97.4 degrees F oral Pulse rate:   62 / minute BP sitting:   140 / 84  (right arm)  Vitals Entered By: Theotis Barrio NT II (December 21, 2008 10:24 AM) CC: nEED REFILLS, URGENT CARE  FOLLOWUP ? FEELS SLEEPY AND DROWINESS COULD MEDICINES BE THE CAUSE Is Patient Diabetic? No Pain Assessment Patient in pain? yes     Location: LEG Intensity: 0 Type: HURT Onset of pain  Intermittent Nutritional Status BMI of > 30 = obese  Have you ever been in a relationship where you felt threatened, hurt or afraid?No   Does patient need assistance? Functional Status Self care Ambulation Normal   Primary Care Provider:  Ardyth Harps  CC:  nEED REFILLS and URGENT CARE  FOLLOWUP ? FEELS SLEEPY AND DROWINESS COULD MEDICINES BE THE CAUSE.  History of Present Illness: 65 yo woman with PMH as detailed below who presents after an acute care visit where she was seen for some bladder/urinary complaints. The patient says that she has had the sensation of having to "hold" her lower abdomen with she urinates. I ask what happens if she does not hold it and she says she gets a "funny" feeling. She also says that she has pain with bowel movements. She also tells me that she felt something firm at the entrance of her vagina with wiping a few days ago but that it went away after that.   She says that she has also been having 2 weeks of "leaky" urine. She says that she has to wear a pad daily because she has dribbling when she walks, when she coughs, when she laughs. She says that she does not have an OBGYN that she follows with currently, but she did have 3 vaginal deliveries in the past.    Although patient presents with acute complaint, I notice that she was last seen 06-2008 in clinic and is followed  for:  Hypothyroidism: TSH was 8.5 in 04/2008. She tells me that she has been taking synthroid 50/day.   HTN: Taking meds as listed in med list and followed by Dr. Excell Seltzer of cardiology. Has a fu with Dr. Excell Seltzer 12-27-2008.  Hyperlipidemia: Lipid profile showed HDL 35, LDL 131 04/2008. At that time simvastatin was increased from 40MG  to 80MG /day.    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 Take 1 Tablet By Mouth Once A Day 2)  Cvs Aspirin 325 Mg Tabs (Aspirin) .... Take 1 Tablet By Mouth Once A Day  3)  Zocor 80 Mg Tabs (Simvastatin) .... Take 1 Tablet By Mouth Once A Day 4)  Synthroid 50 Mcg  Tabs (Levothyroxine Sodium) .... Take 1 Tablet By Mouth Once A Day 5)  Lisinopril-Hydrochlorothiazide 20-25 Mg Tabs (Lisinopril-Hydrochlorothiazide) .... Take 1 Tablet By Mouth Once A Day 6)  Carvedilol 12.5 Mg Tabs (Carvedilol) .... Take One Tablet By Mouth Twice A Day  Allergies (verified): No Known Drug Allergies  Review of Systems  The patient denies anorexia, fever, weight loss, weight gain, vision loss, decreased hearing, hoarseness, chest pain, syncope, dyspnea on exertion, peripheral edema, prolonged cough, headaches, hemoptysis, abdominal  pain, melena, hematochezia, severe indigestion/heartburn, hematuria, genital sores, muscle weakness, suspicious skin lesions, transient blindness, difficulty walking, depression, unusual weight change, abnormal bleeding, and enlarged lymph nodes.         See HPI regarding urinary incontinence and feeling of fullness.   Physical Exam  General:  alert, well-developed, well-nourished, and well-hydrated.   Head:  normocephalic and atraumatic.   Eyes:  vision grossly intact, pupils equal, pupils round, and pupils reactive to light.   Ears:  R ear normal and L ear normal.   Nose:  no external deformity.   Lungs:  no intercostal retractions, no accessory muscle use, normal breath sounds, no crackles, and no wheezes.   Heart:  normal rate, regular rhythm, no murmur, no gallop, and no rub.   Abdomen:  soft, non-tender, and normal bowel sounds.   Genitalia:  normal introitus, no external lesions, and no vaginal discharge.    Patient has small amount of urine that has leaked from urethra. Speculum exam shows an area of bulging in the anterior vaginal wall. Palpation of this area reveals that bladder is prolapsing through area of weakness in vaginal wall.  Neurologic:  alert & oriented X3, cranial nerves II-XII intact, and strength normal in all extremities.   Psych:  Oriented X3, memory intact for recent and remote, normally interactive, good eye contact, not anxious appearing, and not depressed appearing.     Impression & Recommendations:  Problem # 1:  CYSTOCELE WITHOUT MENTION UTERINE PROLAPSE MIDLN (ICD-618.01) Patient has bladder prolapsing into area of weakness in vaginal wall. This is causing discomfort as well as urinary incontinence. I have referred to gynecology to have this further evaluated and treated.   Orders: Gynecologic Referral (Gyn)  Problem # 2:  HYPERTENSION, MALIGNANT, UNCONTROLLED (ICD-401.0) She is seeing her cardiologist in a few days, and she tells me that he is primarily  managing her HTN. I will not make any changes today.   **Creatinine, K+ WNL today.  Her updated medication list for this problem includes:    Norvasc 10 Mg Tabs (Amlodipine besylate) .Marland Kitchen... Take 1 tablet by mouth once a day    Lisinopril-hydrochlorothiazide 20-25 Mg Tabs (Lisinopril-hydrochlorothiazide) .Marland Kitchen... Take 1 tablet by mouth once a day    Carvedilol 12.5 Mg Tabs (Carvedilol) .Marland Kitchen... Take one tablet by mouth twice a day  Problem # 3:  HYPOTHYROIDISM (ICD-244.9)  TSH was elevated at last check. I do not see where this was rechecked. Checking TSH today.  **TSH has increased slightly. I will ask patient to increase synthroid to 75/day and repeat thyroid function in 6-8 weeks.   Her updated medication list for this problem includes:    Synthroid 50 Mcg Tabs (Levothyroxine sodium) .Marland Kitchen... Take 1 tablet by mouth once a day  Orders: T-TSH ([id number])  Problem # 4:  HYPERLIPIDEMIA (ICD-272.4) Zocor increased from 40 to 80 at last visit. Rechecking lipid  panel and Cmet today. Patient is fasting.   **LDL has decreased from 131 to 103. LFTs WNL. Will continue current regimen for now and recheck in future.   Her updated medication list for this problem includes:    Zocor 80 Mg Tabs (Simvastatin) .Marland Kitchen... Take 1 tablet by mouth once a day  Orders: T-Comprehensive Metabolic Panel [id number]) T-Lipid Profile ([id number])  Complete Medication List: 1)  Norvasc 10 Mg Tabs (Amlodipine besylate) .... Take 1 tablet by mouth once a day 2)  Cvs Aspirin 325 Mg Tabs (Aspirin) .... Take 1 tablet by mouth once a day 3)  Zocor 80 Mg Tabs (Simvastatin) .... Take 1 tablet by mouth once a day 4)  Synthroid 50 Mcg Tabs (Levothyroxine sodium) .... Take 1 tablet by mouth once a day 5)  Lisinopril-hydrochlorothiazide 20-25 Mg Tabs (Lisinopril-hydrochlorothiazide) .... Take 1 tablet by mouth once a day 6)  Carvedilol 12.5 Mg Tabs (Carvedilol) .... Take one tablet by mouth twice a day  Patient  Instructions: 1)  We will give you an appointment with the OBGYN clinic and will contact you with this. 2)  We will contact you with yuor labs results. 3)  Please call the clinic with any questions or concerns.  Process Orders Check Orders Results:     [facility]: ABN not required for this insurance Tests Sent for requisitioning (December 22, 2008 4:09 PM):     12/21/2008: [facility] -- T-Comprehensive Metabolic Panel [[id number]] (signed)     12/21/2008: [facility] -- T-Lipid Profile [id number] (signed)     12/21/2008: [facility] -- T-TSH [phone] (signed)    Prevention & Chronic Care Immunizations   Influenza vaccine: Not documented    Tetanus booster: Not documented    Pneumococcal vaccine: Not documented    H. zoster vaccine: Not documented  Colorectal Screening   Hemoccult: Not documented    Colonoscopy: Not documented  Other Screening   Pap smear: NEGATIVE FOR INTRAEPITHELIAL LESIONS OR MALIGNANCY.  (05/04/2008)    Mammogram: ASSESSMENT: Negative - BI-RADS 1^MS DIGITAL SCREENING  (05/17/2008)    DXA bone density scan: Not documented   Smoking status: quit  (12/21/2008)  Lipids   Total Cholesterol: 194  (05/04/2008)   LDL: 131  (05/04/2008)   LDL Direct: 164.0  (04/02/2007)   HDL: 35  (05/04/2008)   Triglycerides: 138  (05/04/2008)    SGOT (AST): 16  (05/04/2008)   SGPT (ALT): 20  (05/04/2008) CMP ordered    Alkaline phosphatase: 67  (05/04/2008)   Total bilirubin: 0.4  (05/04/2008)  Hypertension   Last Blood Pressure: 140 / 84  (12/21/2008)   Serum creatinine: 0.98  (05/04/2008)   Serum potassium 3.6  (05/04/2008) CMP ordered   Self-Management Support :    Patient will work on the following items until the next clinic visit to reach self-care goals:     Medications and monitoring: take my medicines every day, bring all of my medications to every visit  (12/21/2008)      Eating: eat more vegetables, eat foods that are low in salt, eat baked foods instead of fried foods, eat fruit for snacks and desserts  (12/21/2008)     Activity: take a 30 minute walk every day  (12/21/2008)    Hypertension self-management support: Not documented    Lipid self-management support: Not documented

## 2010-03-08 NOTE — Miscellaneous (Signed)
Summary: [employer]  [employer]   Imported By: Florinda Marker 06/13/2008 14:23:18  _____________________________________________________________________  External Attachment:    Type:   Image     Comment:   External Document

## 2010-03-08 NOTE — Progress Notes (Signed)
Summary: Synthroid increase  Phone Note Outgoing Call   Call placed by: Angelina Ok RN,  August 04, 2006 9:32 AM Call placed to: Patient Summary of Call: Call to pt to inform of need to increase dosage of Synthroid from to 50 micrograms daily per oredre of Dr. Ardyth Harps.  Pt voiced understanding of.  Prescription for Synthroid 50 micrograms 1 by mouth daily  # 31 with 3 refills called to [facility] at [phone] per order of Dr. Ardyth Harps. Initial call taken by: Angelina Ok RN,  August 04, 2006 9:35 AM

## 2010-03-08 NOTE — Progress Notes (Signed)
Summary: patient assistance  Phone Note Refill Request        Prescriptions: NORVASC 5 MG TABS (AMLODIPINE BESYLATE) Take 1 tablet by mouth once a day  #90 x 0   Entered and Authorized by:   Concepcion Elk   BA.,CPht II   Signed by:   Linward Foster MD on 06/02/2008   Method used:   Samples Given   RxID:   [id number]   Patient Assist Medication Verification: Medication: Norvasc 5mg  Lot#  [id number] Exp Date:02/13 Tech approval:NLS

## 2010-03-08 NOTE — Progress Notes (Signed)
   Patient Assistance forms for Norvasc 5mg  and Synthroid mailed today...................................................................Marland KitchenConcepcion Elk  Jun 10, 2006 4:12 PM

## 2010-03-08 NOTE — Consult Note (Signed)
Summary: EN & T : Dr. Ezzard Standing  EN & T : Dr. Ezzard Standing   Imported By: Florinda Marker 07/22/2007 14:58:52  _____________________________________________________________________  External Attachment:    Type:   Image     Comment:   External Document

## 2010-03-08 NOTE — Progress Notes (Signed)
Summary: Patient Assistance    Prescriptions: NORVASC 5 MG TABS (AMLODIPINE BESYLATE) Take 1 tablet by mouth once a day  #90 x 0   Entered and Authorized by:   Chauncey Reading DO   Signed by:   Chauncey Reading DO on 10/01/2007   Method used:   Samples Given   RxID:   [id number]   Patient Assist Medication Verification: Medication: NOrvasc 5mg  Lot# [id number] Exp Date:01-12 Tech approval:NLS

## 2010-03-08 NOTE — Letter (Signed)
Summary: Appointment - Missed  [facility], Main Office  [address]   [place], [place] [address]   Phone: [phone]  Fax: [phone]     October 17, 2008 MRN: [id number]   HAIDE KLINKER [address] [place], [place]  [address]   Dear Ms. SELVY,  Our records indicate you missed your appointment on September 29, 2008 with Dr. Excell Seltzer. It is very important that we reach you to reschedule this appointment. We look forward to participating in your health care needs. Please contact us at the number listed above at your earliest convenience to reschedule this appointment. Any scheduler answering the phone will be able to help you with this appointment.     Sincerely,  Lela Laverta [place] Scheduling Team

## 2010-03-08 NOTE — Progress Notes (Signed)
Summary: Patient Assistance   Prescriptions: NORVASC 5 MG TABS (AMLODIPINE BESYLATE) Take 1 tablet by mouth once a day  #90 x 0   Entered and Authorized by:   Manning Charity MD   Signed by:   Manning Charity MD on 08/21/2006   Method used:   Samples Given   RxID:   [id number]   Patient Assist Medication Verification: Medication: NOrvasc 5mg  Lot# [id number] Exp Date:01/12 Tech approval:NLS               Call placed to patient with message that assistance medications are ready for pick-up. ..................................................................Marland KitchenConcepcion Elk  September 02, 2006 2:52 PM  Prescription/Samples picked up by: patient (Daughter)...................................................................Marland KitchenConcepcion Elk  October 14, 2006 10:12 AM

## 2010-03-08 NOTE — Miscellaneous (Signed)
Summary: [employer]  [employer]   Imported By: Florinda Marker 06/07/2008 14:35:17  _____________________________________________________________________  External Attachment:    Type:   Image     Comment:   External Document

## 2010-03-08 NOTE — Progress Notes (Signed)
Summary: Patient Assistance  Phone Note Refill Request        Prescriptions: NORVASC 5 MG TABS (AMLODIPINE BESYLATE) Take 1 tablet by mouth once a day  #90 x 0   Entered and Authorized by:   Concepcion Elk   A.A.S.,CPht II   Signed by:   Peggye Pitt MD on 08/04/2007   Method used:   Samples Given   RxID:   [id number]   Patient Assist Medication Verification: Medication: Norvasc 5mg  Lot#  [id number] Exp Date:01-12 Tech approval:NLS

## 2010-03-08 NOTE — Progress Notes (Signed)
Summary: refills  Phone Note Refill Request Call back at [phone] Message from:  Pharmacy on [facility]  Pharmacy received a message from patient stating prescriptions were to be called in over the weekend.  They have not received a request and do not know what medications are needed  Initial call taken by: Burnard Leigh,  January 16, 2009 11:39 AM  Follow-up for Phone Call        Pt. states she doesn't need any med to be refilled. Pharmacy notified. Follow-up by: Vikki Ports,  January 17, 2009 4:02 PM

## 2010-03-08 NOTE — Miscellaneous (Signed)
  Clinical Lists Changes  Medications: Added new medication of SYNTHROID 25 MCG TABS (LEVOTHYROXINE SODIUM) Take 1 tablet by mouth once a day

## 2010-03-08 NOTE — Miscellaneous (Signed)
Summary: Orders Update  Clinical Lists Changes  Orders: Added new Test order of Renal Artery Duplex (Renal Artery Duplex) - Signed 

## 2010-03-08 NOTE — Assessment & Plan Note (Signed)
Summary: ACUTE-TO DISCUSS TEST RESULTS OKAY TO OVERBOOK IF ??? PER DR ...   Vital Signs:  Patient Profile:   65 Years Old Female Height:     66 inches Weight:      218.04 pounds BMI:     35.32 Temp:     97.8 degrees F oral Pulse rate:   72 / minute BP sitting:   157 / 87  (right arm)  Pt. in pain?   no  Vitals Entered By: Angelina Ok RN (August 01, 2006 9:38 AM)              Is Patient Diabetic? No Nutritional Status BMI of > 30 = obese  Have you ever been in a relationship where you felt threatened, hurt or afraid?No   Does patient need assistance? Functional Status Self care Ambulation Normal   PCP:  Ardyth Harps  Chief Complaint:  Results.  History of Present Illness: Mrs. Oborn is a 65 y/o woman here to discuss results of her biopsy. It has been quite difficult to trace why and when the biopsy was done as I have not seen her in quite some time. It appears she saw Dr. Elvera Lennox in clinic who believed she a thyroid nodule and sent her for an FNA; when radiology performed the [place], they discovered it was actually in her cervical area. A subsequent CT scan has shown a 1.7 cm nodule in the lower cervical/upper mediastinal area. This was biopsied and path returned a Schwanomma. She does have significant enlargement in her neck area that causes her to have some difficulty swallowing. No hearing problems or respiratory difficulty.  Current Allergies: No known allergies      Review of Systems  The patient denies fever, weight loss, chest pain, dyspnea on exhertion, abdominal pain, and melena.     Physical Exam  General:     Well-developed,well-nourished,in no acute distress; alert,appropriate and cooperative throughout examination Neck:     Enlargement of lower neck area, mostly on the right side. Lungs:     Normal respiratory effort, chest expands symmetrically. Lungs are clear to auscultation, no crackles or wheezes. Heart:     Normal rate and regular rhythm. S1 and S2  normal without gallop, murmur, click, rub or other extra sounds.    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 Given her issues with swallowing, I believe surgical resection is indicated. However, patient does not have insurance. Have asked her to speak with Jaynee Eagles today to see if she qualifies for [employer]. If so will write a referral letter for her.  Problem # 2:  HYPOTHYROIDISM (ICD-244.9) Will check TSH today and adjust as necessary.  Her updated medication list for this problem includes:    Synthroid 25 Mcg Tabs (Levothyroxine sodium) .Marland Kitchen... Take 1 tablet by mouth once a day  Orders: T-TSH ([id number]) Surgical Referral (Surgery)  Labs Reviewed: TSH: 7.96 (05/28/2006)    Chol: 212 (05/28/2006)   HDL: 36.9 (05/28/2006)   LDL: DEL (05/28/2006)   TG: 125 (05/28/2006)   Problem # 3:  HYPERTENSION (ICD-401.9) Managed by Dr. Copper at [facility] cardiology.  Her updated medication list for this problem includes:    Hydrochlorothiazide 25 Mg Tabs (Hydrochlorothiazide) .Marland Kitchen... Take 1 tablet by mouth once a day    Norvasc 5 Mg Tabs (Amlodipine besylate) .Marland Kitchen... Take 1 tablet by mouth once a day    Lisinopril-hydrochlorothiazide 20-25 Mg Tabs (Lisinopril-hydrochlorothiazide) .Marland Kitchen... Take 1 tablet by mouth  once a day   Complete Medication List: 1)  Hydrochlorothiazide 25 Mg Tabs (Hydrochlorothiazide) .... Take 1 tablet by mouth once a day 2)  Norvasc 5 Mg Tabs (Amlodipine besylate) .... Take 1 tablet by mouth once a day 3)  Aspirin 81 Mg Tabs (Aspirin) .... Take 1 tablet by mouth once a day 4)  Pravachol 20 Mg Tabs (Pravastatin sodium) .... Take 1 tablet by mouth at bedtime 5)  Synthroid 25 Mcg Tabs (Levothyroxine sodium) .... Take 1 tablet by mouth once a day 6)  Lisinopril-hydrochlorothiazide 20-25 Mg Tabs (Lisinopril-hydrochlorothiazide) .... Take 1 tablet by mouth once a day   Patient Instructions: 1)  Please schedule a  follow-up appointment in 1 month. 2)  You will see Jaynee Eagles today for assistance with [employer]. 3)  After you have been approved, we will send you to see a surgeon for removal of your mass.

## 2010-03-08 NOTE — Progress Notes (Signed)
Summary: patient Assistance  Phone Note Refill Request      Additional Follow-up for Phone Call Additional follow up Details #2::    viewed refill request, meds given from samples. No further action needed.     Prescriptions: NORVASC 5 MG TABS (AMLODIPINE BESYLATE) Take 1 tablet by mouth once a day  #90 x 0   Entered and Authorized by:   Concepcion Elk   BA.,CPht II   Signed by:   Linward Foster MD on 12/14/2007   Method used:   Samples Given   RxID:   [id number]   Patient Assist Medication Verification: Medication:norvasc 5mg  Lot# [id number] Exp Date:02/12 Tech approval:NLS

## 2010-03-08 NOTE — Assessment & Plan Note (Signed)
Summary: eph  Medications Added CARVEDILOL 12.5 MG TABS (CARVEDILOL) Take one tablet by mouth twice a day        Visit Type:  Follow-up Primary Provider:  Ardyth Harps  CC:  no cardiac complaints.  History of Present Illness: This is a 65 year old African American female patient, who was recently hospitalized for recurrent chest pain, felt secondary to musculoskeletal problems. She was treated with nonsteroidal anti-inflammatories, and is doing better.  The she's been home she denies any recurrent chest pain, shortness of breath, and dyspnea on exertion, dizziness, or presyncope.  Current Medications (verified): 1)  Norvasc 10 Mg Tabs (Amlodipine Besylate) .... Take 1 Tablet By Mouth Once A Day 2)  Cvs Aspirin 325 Mg Tabs (Aspirin) .... Take 1 Tablet By Mouth Once A Day 3)  Zocor 80 Mg Tabs (Simvastatin) .... Take 1 Tablet By Mouth Once A Day 4)  Synthroid 50 Mcg  Tabs (Levothyroxine Sodium) .... Take 1 Tablet By Mouth Once A Day 5)  Lisinopril-Hydrochlorothiazide 20-25 Mg Tabs (Lisinopril-Hydrochlorothiazide) .... Take 1 Tablet By Mouth Once A Day 6)  Coreg 6.25 Mg Tabs (Carvedilol) .... 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    Urinary tract infection    Tobacco abuse, hx of     (12/14/2005)  Review of Systems       she is having knee problems in which she has trouble walking, after she's been sitting for a long time. This is her right knee. All other systems negative  Vital Signs:  Patient profile:   65 year old female Height:      64.5 inches Weight:      215 pounds BMI:     36.47 Pulse rate:   60 / minute Pulse rhythm:   regular BP sitting:   150 / 90  (left  arm)  Vitals Entered By: Jacquelin Hawking, CMA (Jun 22, 2008 9:46 AM)  Physical Exam  General:   Well-nournished, in no acute distress. Neck: No JVD, HJR, Bruit, or thyroid enlargement Lungs: No tachypnea, clear without wheezing, rales, or rhonchi Cardiovascular: RRR, PMI not displaced, heart sounds normal, no murmurs, gallops, bruit, thrill, or heave. Abdomen: BS normal. Soft without organomegaly, masses, lesions or tenderness. Extremities: without cyanosis, clubbing or edema. Good distal pulses bilateral SKin: Warm, no lesions or rashes  Musculoskeletal: No deformities Neuro: no focal signs    Impression & Recommendations:  Problem # 1:  CORONARY ARTERY DISEASE (ICD-414.00)  patient had recent admission for chest pain, which was felt to be musculoskeletal. Cardiac enzymes were negative. She has not had any recurrence. Her updated medication list for this problem includes:    Norvasc 10 Mg Tabs (Amlodipine besylate) .Marland Kitchen... Take 1 tablet by mouth once a day    Cvs Aspirin 325 Mg Tabs (Aspirin) .Marland Kitchen... Take 1 tablet by mouth once a day    Lisinopril-hydrochlorothiazide 20-25 Mg Tabs (Lisinopril-hydrochlorothiazide) .Marland Kitchen... Take 1 tablet by mouth once a day    Coreg 6.25 Mg Tabs (Carvedilol) .Marland Kitchen... Take 1 tablet by mouth two times a day  Her updated medication list for this problem includes:    Norvasc 10 Mg Tabs (Amlodipine besylate) .Marland KitchenMarland KitchenMarland KitchenMarland Kitchen  Take 1 tablet by mouth once a day    Cvs Aspirin 325 Mg Tabs (Aspirin) .Marland Kitchen... Take 1 tablet by mouth once a day    Lisinopril-hydrochlorothiazide 20-25 Mg Tabs (Lisinopril-hydrochlorothiazide) .Marland Kitchen... Take 1 tablet by mouth once a day    Carvedilol 12.5 Mg Tabs (Carvedilol) .Marland Kitchen... 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 I will increase her Coreg to 12.5 mg b.i.d. Her updated medication list for this problem includes:     Norvasc 10 Mg Tabs (Amlodipine besylate) .Marland Kitchen... Take 1 tablet by mouth once a day    Cvs Aspirin 325 Mg Tabs (Aspirin) .Marland Kitchen... Take 1 tablet by mouth once a day    Lisinopril-hydrochlorothiazide 20-25 Mg Tabs (Lisinopril-hydrochlorothiazide) .Marland Kitchen... Take 1 tablet by mouth once a day    Coreg 6.25 Mg Tabs (Carvedilol) .Marland Kitchen... Take 1 tablet by mouth two times a day  Her updated medication list for this problem includes:    Norvasc 10 Mg Tabs (Amlodipine besylate) .Marland Kitchen... Take 1 tablet by mouth once a day    Cvs Aspirin 325 Mg Tabs (Aspirin) .Marland Kitchen... Take 1 tablet by mouth once a day    Lisinopril-hydrochlorothiazide 20-25 Mg Tabs (Lisinopril-hydrochlorothiazide) .Marland Kitchen... Take 1 tablet by mouth once a day    Carvedilol 12.5 Mg Tabs (Carvedilol) .Marland Kitchen... Take one tablet by mouth twice a day  Patient Instructions: 1)  She is take Coreg 6.25 mg 2 twice a day until she runs out and then will prescribe Coreg 12.5 mg , one tablet twice a day.  2)  She should make an appointment to see Dr. Excell Seltzer back in 3-4 months. Prescriptions: CARVEDILOL 12.5 MG TABS (CARVEDILOL) Take one tablet by mouth twice a day  #60 x 6   Entered by:   Dossie Arbour, RN, BSN   Authorized by:   Marletta Lor, PA-C   Signed by:   Dossie Arbour, RN, BSN on 06/22/2008   Method used:   Electronically to        [facility] (retail)       [address]       [place], [place]  [address]       Ph: [phone]       Fax: [phone]   RxID:   [id number]

## 2010-03-08 NOTE — Letter (Signed)
Summary: Colonoscopy Letter  [facility]  [address] [place], [place] [address]   Phone: [phone]  Fax: [phone]      September 19, 2009 MRN: [id number]   [facility] [address] [place], [place]  [address]   Dear Ms. HEAD,   According to your medical record, it is time for you to schedule a Colonoscopy. The American Cancer Society recommends this procedure as a method to detect early colon cancer. Patients with a family history of colon cancer, or a personal history of colon polyps or inflammatory bowel disease are at increased risk.  This letter has beeen generated based on the recommendations made at the time of your procedure. If you feel that in your particular situation this may no longer apply, please contact our office.  Please call our office at [phone] to schedule this appointment or to update your records at your earliest convenience.  Thank you for cooperating with [place] to provide you with the very best care possible.   Sincerely,   Iva Boop, M.D.  [facility] [phone]

## 2010-03-08 NOTE — Letter (Signed)
Summary: [facility]: Appt[facility] Notice  [facility]   Imported By: Shon Hough 08/25/2009 16:00:17  _____________________________________________________________________  External Attachment:    Type:   Image     Comment:   External Document

## 2010-04-02 ENCOUNTER — Telehealth: Payer: Self-pay | Admitting: Cardiovascular Disease

## 2010-04-12 NOTE — Progress Notes (Signed)
Summary:  leg pain  Phone Note Call from Patient   Caller: Patient [phone] Reason for Call: Talk to Nurse Summary of Call: pt having having with r leg, x 1 month, seen in er had ekg                          Follow-up for Phone Call        Pt seen in ER 02/24/10 Diagnosed with Sciatica and given Medrol and Meloxicam.  Venous duplex was neg for DVT.  Pt was instructed to f/u with outpatient clinic.  Pt last seen by Dr Excell Seltzer 01/06/09.  I spoke with the pt and when she sits down she has trouble getting back up.  The pt can barely stand on her right leg.  The pt does c/o pain in her thigh and calf of right leg and swelling below her knee.  I made the pt aware that she needs to contact the [facility] for further evaluation of these symptoms.  Pt agreed with plan.  I also made the pt aware that she is overdue for a cardiac check-up.  The pt has no cardiac complaints.  Appt arranged with Dr Excell Seltzer on 04/30/10 at 2:30.  Follow-up by: Julieta Gutting, RN, BSN,  April 02, 2010 1:24 PM

## 2010-04-14 ENCOUNTER — Encounter: Payer: Self-pay | Admitting: Cardiovascular Disease

## 2010-04-20 LAB — POCT PREGNANCY, URINE: Preg Test, Ur: NEGATIVE

## 2010-04-22 LAB — POCT URINALYSIS DIP (DEVICE)
Bilirubin Urine: NEGATIVE
Glucose, UA: NEGATIVE mg/dL
Ketones, ur: NEGATIVE mg/dL
Nitrite: NEGATIVE
Protein, ur: NEGATIVE mg/dL
Specific Gravity, Urine: <=1.005 (ref 1.005–1.030)
Urobilinogen, UA: 0.2 mg/dL (ref 0.0–1.0)
pH: 5 (ref 5.0–8.0)

## 2010-04-30 ENCOUNTER — Ambulatory Visit: Payer: Self-pay | Admitting: Cardiovascular Disease

## 2010-05-09 LAB — POCT URINALYSIS DIP (DEVICE)
Bilirubin Urine: NEGATIVE
Glucose, UA: NEGATIVE mg/dL
Ketones, ur: NEGATIVE mg/dL
Nitrite: NEGATIVE
Specific Gravity, Urine: 1.01 (ref 1.005–1.030)
Urobilinogen, UA: 0.2 mg/dL (ref 0.0–1.0)
pH: 6 (ref 5.0–8.0)

## 2010-05-16 LAB — POCT I-STAT, CHEM 8
BUN: 12 mg/dL (ref 6–23)
Calcium, Ion: 1.09 mmol/L — ABNORMAL LOW (ref 1.12–1.32)
Chloride: 104 mEq/L (ref 96–112)
Creatinine, Ser: 0.9 mg/dL (ref 0.4–1.2)
Glucose, Bld: 121 mg/dL — ABNORMAL HIGH (ref 70–99)
HCT: 37 % (ref 36.0–46.0)
Hemoglobin: 12.6 g/dL (ref 12.0–15.0)
Potassium: 3.1 mEq/L — ABNORMAL LOW (ref 3.5–5.1)
Sodium: 141 mEq/L (ref 135–145)
TCO2: 26 mmol/L (ref 0–100)

## 2010-05-16 LAB — CBC
HCT: 36.4 % (ref 36.0–46.0)
Hemoglobin: 12 g/dL (ref 12.0–15.0)
MCHC: 33 g/dL (ref 30.0–36.0)
MCV: 86.6 fL (ref 78.0–100.0)
Platelets: 278 10*3/uL (ref 150–400)
RBC: 4.2 MIL/uL (ref 3.87–5.11)
RDW: 13.4 % (ref 11.5–15.5)
WBC: 8.2 10*3/uL (ref 4.0–10.5)

## 2010-05-16 LAB — DIFFERENTIAL
Basophils Absolute: 0.1 10*3/uL (ref 0.0–0.1)
Basophils Relative: 1 % (ref 0–1)
Eosinophils Absolute: 0.2 10*3/uL (ref 0.0–0.7)
Eosinophils Relative: 3 % (ref 0–5)
Lymphocytes Relative: 38 % (ref 12–46)
Lymphs Abs: 3.2 10*3/uL (ref 0.7–4.0)
Monocytes Absolute: 0.6 10*3/uL (ref 0.1–1.0)
Monocytes Relative: 7 % (ref 3–12)
Neutro Abs: 4.2 10*3/uL (ref 1.7–7.7)
Neutrophils Relative %: 51 % (ref 43–77)

## 2010-05-16 LAB — CK TOTAL AND CKMB (NOT AT ARMC)
CK, MB: 2.8 ng/mL (ref 0.3–4.0)
Relative Index: 1.7 (ref 0.0–2.5)
Total CK: 164 U/L (ref 7–177)

## 2010-05-16 LAB — POCT CARDIAC MARKERS
CKMB, poc: 1.4 ng/mL (ref 1.0–8.0)
Myoglobin, poc: 47.7 ng/mL (ref 12–200)
Troponin i, poc: <0.05 ng/mL (ref 0.00–0.09)

## 2010-05-16 LAB — CARDIAC PANEL(CRET KIN+CKTOT+MB+TROPI)
CK, MB: 2.6 ng/mL (ref 0.3–4.0)
Relative Index: 2 (ref 0.0–2.5)
Total CK: 131 U/L (ref 7–177)
Troponin I: 0.01 ng/mL (ref 0.00–0.06)

## 2010-05-16 LAB — TROPONIN I: Troponin I: 0.02 ng/mL (ref 0.00–0.06)

## 2010-05-16 LAB — PROTIME-INR
INR: 1 (ref 0.00–1.49)
Prothrombin Time: 13 seconds (ref 11.6–15.2)

## 2010-06-19 NOTE — Assessment & Plan Note (Signed)
[facility]                            CARDIOLOGY OFFICE NOTE   HEILEY, SHAIKH                        MRN:          [id number]  DATE:01/18/2008                            DOB:          1945-10-22    REASON FOR VISIT:  Followup CAD and recent stress test.   HISTORY OF PRESENT ILLNESS:  [name] was seen in followup at the  [facility]iology Office on January 18, 2008.  She is a 65 year old  woman with malignant hypertension, renal artery stenosis, and coronary  artery disease.  She was seen on January 07, 2008 and at that time  complained of increased substernal chest pressure with physical activity  as well as emotional stress.  I was concerned about her symptoms  representing myocardial ischemia and she therefore underwent a Myoview  stress scan.  Of note, at that time her blood pressure was out of  control because of medications non-adherence.  Her Myoview scan was  completed on January 15, 2008 and showed a gated LVEF of 67% with no  evidence of scar or ischemia.  She has subsequently improved now back on  her medications.  She has had a few occasions of chest pain, but they  have been much less than before.  She denies dyspnea, edema, orthopnea,  or PND.   CURRENT MEDICATIONS:  Include  1. Aspirin 325 mg daily.  2. Lisinopril HCT 20/25 mg daily.  3. Norvasc 5 mg daily.  4. Carvedilol 6.25 mg twice daily.  5. Simvastatin 80 mg daily.   ALLERGIES:  NKDA.   PHYSICAL EXAMINATION:  GENERAL:  She is alert and oriented, in no acute  distress.  VITAL SIGNS:  Weight is 218 pounds, blood pressure 140/90, heart rate  68, and respiratory rate 12.  HEENT:  Normal.  NECK:  Normal carotid upstrokes.  No bruits.  JVP normal.  LUNGS:  Clear bilaterally.  HEART:  Regular rate and rhythm.  No murmurs or gallops.  ABDOMEN:  Soft, nontender, and no organomegaly.  EXTREMITIES:  No clubbing, cyanosis, or edema.   ASSESSMENT:  1. Coronary artery  disease with class II angina at present.  Continue      medical therapy.  Normal stress nuclear study as above.  I am going      to increase her carvedilol to 12.5 mg twice daily with hopes of      better blood pressure control and less angina.  2. Hypertension.  Blood pressure control is much better.  We will      double Coreg to 12.5 mg twice daily.  3. Renal artery stenosis as per previous note, her a last renal      ultrasound in April 2009 showed patent stents.  We will followup in      next Spring.   Overall, [name] is improved.  I would like to see her back in 6  months.     Veverly Fells. Excell Seltzer, MD  Electronically Signed    MDC/MedQ  DD: 01/22/2008  DT: 01/22/2008  Job #: [id number]   cc:   Patrcia Dolly  [facility]

## 2010-06-19 NOTE — Assessment & Plan Note (Signed)
[facility]                            CARDIOLOGY OFFICE NOTE   Teresa, [name]                        MRN:          [id number]  DATE:04/02/2007                            DOB:          09/13/1945    [name] was seen in follow-up at the [facility] Cardiology office on  April 02, 2007.  [name] is a very nice 65 year old woman with  hypertension and renal artery stenosis as well as coronary artery  disease.  She had bilateral renal stenting in 2007.  She has been doing  relatively well.  [name] has joined the [facility] and has been trying to  exercise more frequently.  She has also been walking regularly.  She has  had no chest pain, dyspnea, edema, orthopnea, PND or palpitations.  She  was unable to get Norvasc from her pharmacy so she has not been taking  this for the last 3 weeks.  Otherwise, she has been doing better about  taking her medications regularly.   MEDICATIONS:  1. Aspirin 325 mg daily.  2. Lisinopril/HCT 20/25 mg daily.  3. K-Dur 20 mEq daily (not currently taking).  4. Coreg 6.25 mg b.i.d.  5. Norvasc 5 mg daily (not currently taking).  6. Levothyroxine 50 mcg daily.  7. Simvastatin 40 mg at bedtime.   ALLERGIES:  No known drug allergies.   EXAM:  The patient is alert and oriented.  She is in no acute distress.  Weight is 208, blood pressure 160/90, heart rate 54, respiratory rate  16.  HEENT:  Normal.  NECK:  Normal carotid upstrokes with bilateral carotid bruits.  JVP is  normal.  LUNGS:  Clear to auscultation bilaterally.  HEART:  Regular rate and rhythm without murmurs or gallops.  ABDOMEN:  Soft, nontender, no organomegaly.  EXTREMITIES:  No clubbing, cyanosis or edema.  Peripheral pulses are 2+  and equal throughout   EKG shows sinus bradycardia with first-degree AV block and a nonspecific  T-wave abnormality.   ASSESSMENT:  1. Renal artery stenosis, status post bilateral renal stenting.  Most      recent  renal ultrasound from October 2008 showed normal bilateral      kidney size with stable renal artery stenosis in the range of 1-59%      status post stenting.  Continue surveillance with 65-month      ultrasound in April or May of this year.  If this ultrasound is      stable, she can follow up yearly thereafter.  2. Malignant hypertension.  Her blood pressure has been difficult to      control despite multiple medications.  Her blood pressure of late      has been better-controlled and I think with resuming Norvasc she      should be able to achieve goal blood pressure once again.  We have      given her a prescription for this and she is going to try to get      this filled at [facility] and should continue her Coreg, lisinopril  and hydrochlorothiazide as well.  We will check her basic metabolic      panel since she is on multiple antihypertensive medications.  3. Coronary artery disease remains asymptomatic with no angina.      Continue aspirin and simvastatin as well as medical therapy as      outlined above.   For follow-up, I would like to see [name] back in 6 months.     Veverly Fells. Excell Seltzer, MD  Electronically Signed    MDC/MedQ  DD: 04/02/2007  DT: 04/03/2007  Job #: [id number]   cc:   Peggye Pitt, M.D.

## 2010-06-19 NOTE — Assessment & Plan Note (Signed)
[facility]                            CARDIOLOGY OFFICE NOTE   SHANAVIA, MAKELA                        MRN:          [id number]  DATE:07/17/2006                            DOB:          [birth year]    [name] returns for followup at th [facility] Cardiology office on July 17, 2006.  She is a 65 year old woman with malignant hypertension, renal  artery stenosis and coronary artery disease.  I saw her last on May 14th  and increased her antihypertensive regimen at that point.  At the time  of her office visit she had a blood pressure of 170/90, which was  actually improved from her previous visit where she was greater than  200/100.  She had been started on a combination of carvedilol,  lisinopril and hydrochlorothiazide.  I elected to increase her  medications with a doubling of her carvedilol dose and the addition of  Norvasc.  She has done well with this change and is feeling better  symptomatically.  She has a better energy level.  She continues to have  some nausea with walking but as she continues to walk her symptoms  improve.  She is not having any typical angina.  She denies chest pain.  She does have some exertional dyspnea that is chronic.  She is very  worried about a thyroid biopsy that was recently done and has not gotten  the results from that study yet.   CURRENT MEDICINES:  1. Aspirin 325 mg daily.  2. Lisinopril/HCT 20/25 mg daily.  3. Pravastatin 25 mg at bedtime which she has run out of.  4. Coreg 6.25 mg twice daily.  5. Norvasc 5 mg daily.   ALLERGIES:  NKDA.   PHYSICAL EXAMINATION:  The patient is alert and oriented.  She is in no  acute distress.  Weight is 214 pounds, blood pressure 142/70, heart rate  64, respiratory rate is 16.  HEENT:  Normal.  NECK:  Normal carotid upstrokes without bruits, jugular venous pressure  is normal.  LUNGS:  Clear to auscultation bilaterally.  HEART:  Regular rate and rhythm without  murmurs or gallops.  ABDOMEN:  Soft, nontender.  No abdominal bruits, no masses, no rebound  or guarding.  EXTREMITIES:  No cyanosis, clubbing or edema.  Peripheral pulses are 2+  and equal throughout.   ASSESSMENT:  Ms. Melder is currently stable from a cardiovascular  standpoint.  Her cardiac problems are as follows:  1. Malignant hypertension.  Her blood pressure is much better      controlled on her current therapy.  Will continue without changes      at this point.  There is some room to increase both the Coreg and      her Norvasc for improvement in blood pressure control.  2. Renal artery stenosis.  She underwent renal ultrasound and it was a      difficult study.  She has had bilateral renal artery stenting.  She      will require regular followup of her creatine, which has been  normal in the past and was recently checked in May and was 0.9.      Will also follow her blood pressure carefully.  As long as we are      able to control her blood pressure I would not be inclined to      restudy her with angiography unless she has problems.  3. Coronary artery disease.  She has diffuse small vessel disease.      She was recently cathed in November of 2007.  Medical therapy was      recommended and she continues with that medical regimen.  4. Dyslipidemia.  We had increased her Pravachol from 20 to 40 mg      daily, although she is not currently taking it.  We have asked her      to restart Pravachol and will recheck lipids and LFTs in 12 weeks.      I would like to see her back in 12 weeks for followup as well.     Veverly Fells. Excell Seltzer, MD  Electronically Signed    MDC/MedQ  DD: 07/17/2006  DT: 07/17/2006  Job #: [id number]   cc:   D. Oley Balm III, M.D.

## 2010-06-19 NOTE — Discharge Summary (Signed)
[name], [name]                 ACCOUNT NO.:  [id number]   MEDICAL RECORD NO.:  [id number]          PATIENT TYPE:  INP   LOCATION:  3731                         FACILITY:  [facility]   PHYSICIAN:  Veverly Fells. Excell Seltzer, MD  DATE OF BIRTH:  [birth year]   DATE OF ADMISSION:  05/26/2008  DATE OF DISCHARGE:  05/27/2008                               DISCHARGE SUMMARY   PRIMARY CARE PHYSICIAN:  Peggye Pitt, MD   DISCHARGING DIAGNOSES:  1. Chest pain, negative cardiac workup this admission by EKG and      serial cardiac markers, chest pain felt to be musculoskeletal.  The      patient being treated with NSAID therapy.  Follow up outpatient.  2. Malignant hypertension.  The patient on good control on medical      regimen, blood pressure stable here.   PAST MEDICAL HISTORY:  1. Coronary artery disease, status post bare-metal stent to the      circumflex in 1998.  2. Renal artery stenosis, status post bilateral stents.  3. Hypertension.  4. Lipids.  5. CHF with preserved EF.  6. Stroke versus TIA in 2005.   [name] is a 65 year old African American female with past medical  history as stated above, who presented with complaints of chest pain x2  days.  The patient presented to [facility].  CT of the  abdomen, chest, and pelvis was __________ and that was not helpful in  resolving her pain.  EKG showed sinus rhythm with nonspecific ST-T wave  abnormalities.  Cardiac markers, however, were negative.  The patient  was admitted for observation.  She was found to be mildly hypokalemic  with potassium of 3.1, which was treated.  Continued her home  medications.  Dr. Excell Seltzer in to see the patient on the same date.  Later  in the morning, the patient without further complaints of discomfort,  being discharged home to follow up outpatient.   She will continue her medications including:  1. Lisinopril and HCTZ 20/25.  2. Norvasc 5.  3. Carvedilol 6.25 b.i.d.  4. Simvastatin 80.  5. Aspirin 325.  6. Nitroglycerin p.r.n.  7. We instructed her to use Motrin or ibuprofen 600 mg every 6 hours      x3 days.  She can take this with food.   Follow up with her primary care physician if no improvement.  I have  scheduled her to follow up with Dr. Earmon Phoenix PA or nurse practitioner on  Jun 22, 2008, at 10 a.m.   DURATION OF DISCHARGE/ENCOUNTER:  Less than 30 minutes.       Dorian Pod, ACNP      Veverly Fells. Excell Seltzer, MD  Electronically Signed    MB/MEDQ  D:  05/27/2008  T:  05/27/2008  Job:  [id number]   cc:   Peggye Pitt, M.D.

## 2010-06-19 NOTE — H&P (Signed)
[name], [name]                 ACCOUNT NO.:  [id number]   MEDICAL RECORD NO.:  [id number]          PATIENT TYPE:  INP   LOCATION:  3731                         FACILITY:  [facility]   PHYSICIAN:  Wendi Snipes, MD DATE OF BIRTH:  07/08/1945   DATE OF ADMISSION:  05/26/2008  DATE OF DISCHARGE:                              HISTORY & PHYSICAL   CARDIOLOGIST:  Veverly Fells. Excell Seltzer, M.D.   PRIMARY CARE PHYSICIAN:  Peggye Pitt, M.D.   CHIEF COMPLAINTS:  Chest pain.   HISTORY OF PRESENT ILLNESS:  This is a 65 year old African American  female with history of coronary artery disease, status post PCI in 1998,  here with chest pain the last 2 days.  The patient stated that her  symptoms started the day before admission in the afternoon while at  rest.  Pain is described as a severe ache in the middle upper chest that  is associated with deep breaths.  She also reports a dry cough and one  episode of nausea and vomiting in the afternoon yesterday.  She denies  any recent exertional angina and nitroglycerin was not helpful in  resolving this pain.  She came to the ED for further evaluation as her  pain began to radiate to her back.  While in the ED, she received a CT  of the abdomen, chest and pelvis which was unremarkable and her pain  improved greatly.   PAST MEDICAL HISTORY:  1. Coronary artery disease, status post bare metal stent to the      circumflex artery in 1998.  2. For renal artery stenosis, status post bilateral stents.  3. Hypertension.  4. Hyperlipidemia.  5. History of congestive heart failure with preserved ejection      fraction.  6. Stroke versus TIA in 2005.   ALLERGIES:  NO KNOWN DRUG ALLERGIES.   MEDICATIONS ON ADMISSION:  1. Lisinopril/hydrochlorothiazide 20/25 mg daily.  2. Norvasc 5 mg daily.  3. Zocor 40 mg daily.  4. Aspirin 325 mg daily.  5. Coreg 6.25 mg twice daily.  6. Nitroglycerin as needed.   SOCIAL HISTORY:  She lives in [place].  She  does not smoke.   FAMILY HISTORY:  Family history is reviewed, noncontributory.   REVIEW OF SYSTEMS:  All 14 systems reviewed were negative except as  mentioned in detail in HPI.   PHYSICAL EXAMINATION:  VITAL SIGNS:  Blood pressure is 119/63,  respiratory rate 16, pulse 90.  She is afebrile.  Oxygen saturation  98%  on 2 liters nasal cannula.  GENERAL:  She is a 65 year old obese African American female appearing  stated age, in no acute distress.  HEENT: Moist mucous membranes.  Pupils equal, round, react to light and  accommodation.  Anicteric sclerae.  NECK:  No jugular venous distention.  No thyromegaly.  CARDIOVASCULAR:  Regular rate and rhythm.  No murmurs, rubs or gallops.  LUNGS:  Clear to auscultation bilaterally.  ABDOMEN:  Nontender, nondistended.  Positive bowel sounds.  No masses.  EXTREMITIES:  No clubbing, cyanosis, edema.  NEUROLOGIC:  Alert and oriented x3.  Cranial nerves  II-XII are grossly  intact.  No focal neurologic deficits.  SKIN:  Warm, dry and intact.  No  rashes.  PSYCHIATRIC:  Psych and affect are appropriate.   RADIOLOGY:  A CT of chest, abdomen and pelvis showed no acute aortic  vascular abnormality with patent renal artery stents and iliofemoral  atherosclerosis.  EKG showed normal sinus rhythm with a rate of 86 with  nonspecific ST-T wave abnormalities.   LABORATORY DATA:  Hematocrit 37.  Her potassium is 3.1.  Her creatinine  0.9.  Her troponin is 0.02.  Her CK-MB is 2.8.   ASSESSMENT/PLAN:  This is a 65 year old African American female with  coronary disease here with pleuritic chest pain.  1. Chest pain is very atypical for angina and may represent some form      of chest wall inflammation.  Will admit her to rule out myocardial      infarction with serial enzymes.  Will continue current medications      and check serial EKGs in the event that she has more chest pain.  I      believe that if she does rule out, she likely can be discharged       home without any further risk stratification and close followup.      Will consider treating her symptoms with NSAIDs for a very short      course.  Otherwise will continue her current medication.  2. Hypertension.  Her blood pressures currently at goal.  Will      continue her current hypertensive regimen.  3. Hyperlipidemia.  Will continue current statin therapy.      Wendi Snipes, MD  Electronically Signed     BHH/MEDQ  D:  05/27/2008  T:  05/27/2008  Job:  [id number]

## 2010-06-19 NOTE — Assessment & Plan Note (Signed)
NAME:  [name], [name]                 ACCOUNT NO.:  [id number]   MEDICAL RECORD NO.:  [id number]          PATIENT TYPE:  WOC   LOCATION:  [facility]         FACILITY:  [facility]   PHYSICIAN:  Caren Griffins, CNM       DATE OF BIRTH:  08/03/1945   DATE OF SERVICE:  09/07/2009                                  CLINIC NOTE   REASON FOR VISIT:  Referral from [facility] for  postmenopausal bleeding.   HISTORY:  This is a 65 year old G3, P3 who has been menopausal for 13  years and first had vaginal bleeding about 1-1/2 months ago, which she  describes as spotting.  Then in early July 2011, she had spotting which  developed into what she thought was menstrual-like bleeding for a couple  of days, needing a pad and then some more spotting.  She is not sexually  active.  She does not remember when her last Pap smear was, but more  than a year ago and as far as she knows, had no abnormal Pap smears.  She was evaluated by Dr. Garfield Cornea on August 11, 2009, and it is noted that she  was to get a pelvic ultrasound, but she did not get a pelvic ultrasound.  Also of note, on her exam in January 2011, she was thought to have a  cervical polyp at 7 o'clock position and plan was to attempt removal of  that at a followup visit, but there was no followup visit until today.  Her other issues are combination stress and urge urinary incontinence  has been ongoing and that she is needing to wear a pad for.  She has  tried Kegel exercises with very little benefit.  She denies any weight  loss, general malaise, they are systemic symptoms.  She does have  chronic hypertension, hyperlipidemia, and hypothyroidism which are being  followed at [facility].   OBJECTIVE:  VITAL SIGNS:  Temperature 98.6, pulse 61, blood pressure  130/80, weight 213.7.  GENERAL:  WN, WD, in NAD.  Appears stated age.  PELVIC:  No external genitalia lesions.  Cystocele is present without  evidence of  prolapse.  Vagina is pale and smooth.  There is scant  mucousy discharge.  Bimanual exam reveals posterior cervix with  nodularity on the posterior lip.  Due to body habitus, unable to  determine contour and mobility of uterus and there does not seem to be  any uterine or adnexal tenderness or masses.   PROCEDURE NOTE:  After explaining the purpose of procedure endometrial  biopsy, complications, and side effects, the patient agrees to proceed.  Time-out was done.  She was then placed in dorsal lithotomy position and  cervix brought into the view with speculum.  There is apparently fixed,  smooth, pink nodule, protruding from the external os, about 1 cm in  size, also some nodularity at 5-6 o'clock, and this is friable during  the Pap smear.  Uterus was cleansed with Betadine and a single tooth  tenaculum was placed on the anterior cervix to stabilize the uterus.  The uterus sounded to 8.5 cm and 2 passes with a  Pipelle were done to  obtain endometrial tissue, and the patient tolerated that procedure very  well though she did have some cramping and was given ibuprofen 800 mg.   IMPRESSION AND PLAN:  Postmenopausal bleeding and cervical lesion,  possible polyp.  Pap smear and endometrial biopsy done.  She is  scheduled for a pelvic ultrasound and when she comes back in 2 weeks, we  will  review the results of both the ultrasound and the Pap and biopsy and  also reevaluate the cervical lesion that may be a polyp.  In addition,  she is considering whether she may be interested in surgical anterior,  posterior repair.           ______________________________  Caren Griffins, CNM     DP/MEDQ  D:  09/07/2009  T:  09/08/2009  Job:  [id number]

## 2010-06-19 NOTE — Assessment & Plan Note (Signed)
[facility]                            CARDIOLOGY OFFICE NOTE   [name], [name]                        MRN:          [id number]  DATE:12/29/2007                            DOB:          1945/05/16    REASON FOR VISIT:  Hypertension and coronary artery disease.   HISTORY OF PRESENT ILLNESS:  [name] is a 65 year old woman with  malignant hypertension, renal artery stenosis, and coronary artery  disease.  She has severe stenosis throughout a small but dominant right  coronary artery that is collateralized from the left coronary artery.  She has nonobstructive stenosis in the left main LAD and left  circumflex.  Her coronary findings have been previously stable over  several years and her last cardiac catheterization was in November 2007.  She also has undergone bilateral renal stenting for severe renal artery  stenosis and her most recent renal ultrasound in April 2009 showed  stable velocities with less than 60% stenosis bilaterally.   [name] has run out of her medications and has not been able to obtain  them because of cost issues.  She has experienced substernal chest  pressure with certain activities and with emotional stress.  She has had  no resting chest pressure in the absence of emotional stressors.  She  denies edema, headache, vision changes, dyspnea, orthopnea, or PND.   MEDICATIONS:  Aspirin 325 mg daily and Norvasc 5 mg daily.  She has  stopped taking simvastatin, Coreg, lisinopril, and hydrochlorothiazide,  all of which she ran out of last month.   ALLERGIES:  NKDA.   PHYSICAL EXAMINATION:  GENERAL:  The patient is alert and oriented.  No  acute distress.  VITAL SIGNS:  Weight is 215 pounds, blood pressure 118/110, heart rate  66, respiratory rate 16.  HEENT:  Normal.  NECK:  Normal carotid upstrokes.  No bruits.  JVP normal.  LUNGS:  Clear bilaterally.  HEART:  Regular rate and rhythm with an S4 gallop.  ABDOMEN:  Soft,  obese, nontender.  No organomegaly.  EXTREMITIES:  No clubbing, cyanosis. or edema.  SKIN:  Warm and dry without rash.   EKG shows sinus rhythm with LVH and T-wave abnormality suggestive of  anterolateral ischemia.   ASSESSMENT:  1. Coronary artery disease with class II-III angina.  I am concerned      about her increased angina.  It may very well be related to her      chronic coronary artery disease in the setting of elevated blood      pressure.  We will start her back on all of her home medications.      She was given prescriptions for Coreg, lisinopril, and      hydrochlorothiazide, all of which can be obtained in generic form      for low cost.  Also, we will check an exercise Myoview stress scan      to rule out significant ischemia.  2. Malignant hypertension.  Discussion as above.  The patient will be      placed back on her medications and we  will reassess her blood      pressure at the time of her stress test.  3. Renal artery stenosis.  Stable findings but patent stents      bilaterally.  She will be due for a renal ultrasound next spring.  4. Followup.  I would like to see her back in 6 months.  We will      follow up with her after this as soon as the results of her stress      study her back for review.     Veverly Fells. Excell Seltzer, MD  Electronically Signed    MDC/MedQ  DD: 01/07/2008  DT: 01/08/2008  Job #: [id number]   cc:   Chauncey Reading, D.O.

## 2010-06-19 NOTE — Group Therapy Note (Signed)
NAME:  [name], [name] NO.:  [id number]   MEDICAL RECORD NO.:  [id number]          PATIENT TYPE:  WOC   LOCATION:  [facility]                   FACILITY:  [facility]   PHYSICIAN:  Allie Bossier, MD        DATE OF BIRTH:  1945/07/10   DATE OF SERVICE:                                  CLINIC NOTE   Ms. Smet is a 65 year old lady who was referred here by the [facility] for evaluation of a cervical lesion.  They recently  saw her and did a Pap smear.  Those results are not available yet.  Looking back in her records here, she was evaluated for this same  problem by Dr. Perlie Gold in 2007.  At that time he described nabothian  cyst at the 7 o'clock position.  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  She also has a tinier 5 mm  cyst at the 12 o'clock position.  These are both benign, and I have  counseled the patient to just please keep in mind and remind any  providers that she sees that she has the cysts and that they are benign.  Thank you very much for your referral.      Allie Bossier, MD     MCD/MEDQ  D:  07/13/2008  T:  07/13/2008  Job:  [id number]

## 2010-06-19 NOTE — Assessment & Plan Note (Signed)
[facility]                            CARDIOLOGY OFFICE NOTE   LASHAYA, KIENITZ                        MRN:          [id number]  DATE:10/15/2006                            DOB:          [birth year]    Chrishonda Hesch was seen in followup at [facility] cardiology office on  October 15, 2006.  Ms Antigua is a 65 year old woman with malignant  hypertension, renal artery stenosis and coronary artery disease.  She  has had prior bilateral renal stenting.  Her blood pressure has improved  with adherence to antihypertensive therapy.  Overall, Ms Sitton is doing  relatively well at present.  Her activity level is good but she is not  participating in regular exercise.  She has had occasional fleeting  chest pain but does not have any exertional symptoms.  She has had no  symptoms of congestive heart failure as she specifically denies  orthopnea, PND or edema.   CURRENT MEDICATIONS:  1. Aspirin 325 mg daily.  2. Lisinopril/hydrochlorothiazide 20/25 mg daily.  3. K-Dur 20 mEq daily.  4. Pravastatin 40 mg at bedtime.  5. Coreg 6.25 mg twice daily.  6. Norvasc 5 mg daily.  7. Levothyroxine 50 mcg daily.   PHYSICAL EXAMINATION:  She is alert and oriented in no acute distress.  Weight is 215 pounds.  Blood pressure is 140/70, heart rate 64,  respiratory rate 16.  HEENT:  Normal.  NECK:  Normal carotid upstrokes without bruits.  Jugular venous pressure  is normal.  LUNGS:  Clear to auscultation bilaterally.  HEART:  Regular rate and rhythm without murmurs or gallops.  ABDOMEN:  Soft, nontender.  No organomegaly.  No bruits.  BACK:  There is no CVA tenderness.  EXTREMITIES:  No clubbing, cyanosis or edema.  Peripheral pulses 2+ and  equal throughout.   EKG shows normal sinus rhythm with LVH and T wave changes likely  secondary to hypertrophy.   ASSESSMENT:  Ms Veno is currently stable from a cardiovascular  standpoint.  Her cardiac histories are as follows:  1.  Coronary artery disease.  She is stable without angina.  Should      continue on her current therapy without changes.  She remains on      antiplatelet therapy with aspirin.  See below for discussion of      secondary risk reduction.  2. Hypertension.  She is on multi-drug therapy with good control at      present.  Will continue without changes.  3. Renal artery stenosis status post renal stenting.  Her last renal      ultrasound was of difficult quality.  Will follow her renal      arteries by watching her blood pressure closely and monitoring      creatinine every 6 months.  4. Dyslipidemia.  She has been on and off of pravastatin since it was      increased back in May.  At her followup visit in 3 months, will      repeat her LFTs and lipid panel.   FOLLOWUP:  I would like to  see Ms Kupfer back in 3 months for lipids and  LFTs as well as a basic metabolic panel.     Veverly Fells. Excell Seltzer, MD  Electronically Signed    MDC/MedQ  DD: 10/19/2006  DT: 10/20/2006  Job #: [id number]

## 2010-06-19 NOTE — Assessment & Plan Note (Signed)
[facility]                                 ON-CALL NOTE   CHARISH, SCHROEPFER                        MRN:          [id number]  DATE:06/06/2006                            DOB:          [birth year]    I received a call concerning abnormal lab work on [name].  Potassium  of 3.2.  I tried several times to get in touch with [name] by phone,  eventually reaching her at 8:00 p.m. on Jun 06, 2006.  Discussed her low  potassium count with her.  Evidently, she has been recently started on a  diuretic for blood pressure control.  I informed her that we needed to  get some potassium started on her.  She stated she had some old  potassium at home that she had been on in the past but had been  discontinued.  We checked the expiration date; the potassium was still  good.  It was Klor-Con 20 mEq.  I asked her to take two of the 20 mEq  tablets tonight and then resume one tablet daily, 20 mEq.  She is  already scheduled for blood work on May 6, per patient's report, at  which time she will need to have a BMET drawn.  She states she would  keep this appointment.      Dorian Pod, ACNP       Teresa Buckles. Bensimhon, MD    MB/MedQ  DD: 06/06/2006  DT: 06/06/2006  Job #: [id number]

## 2010-06-19 NOTE — Assessment & Plan Note (Signed)
[facility]                            CARDIOLOGY OFFICE NOTE   MALAISHA, SILLIMAN                        MRN:          [id number]  DATE:06/18/2006                            DOB:          1945-09-11    SUBJECTIVE:  [name] presents today to establish care.  She is a  65 year old woman who has been followed in the past by Dr. Rollene Rotunda, and was recently seen by Tereso Newcomer, P.A.-C.  She presents  for followup of malignant hypertension, coronary artery disease and  renovascular disease, status post bilateral renal artery stenting in  November 2007, by Dr. Salvadore Farber.  [name] has undergone a renal  ultrasound study to follow up her stents.  This study was technically  difficult but it demonstrated normal and symmetric kidney size.  She has  at least 1%-59% bilateral renal artery stenosis, based on the Doppler  velocities.  The renal ostia were difficult to visualize bilaterally.   She continues to have elevated blood pressure readings at home; however,  they are better than they had been in the past.  Mr. Tereso Newcomer had  started her on medical therapy for her hypertension, and she seems to be  doing better overall.  She has not had any recent chest pain, dyspnea,  edema or other complaints.  She has also undergone a recent Myoview  stress study that demonstrated normal perfusion without any areas of  inducible ischemia.   CURRENT MEDICATIONS:  1. Aspirin 325 mg daily.  2. Carvedilol 3.125 mg twice daily.  3. Lisinopril/hydrochlorothiazide 20/25 mg daily.  4. K-Dur 20 mEq daily.  5. Pravastatin 20 mg at bedtime.   ALLERGIES:  No known drug allergies.   PHYSICAL EXAMINATION:  GENERAL:  The patient is alert and oriented.  She  is in no acute distress.  VITAL SIGNS:  Weight 215 pounds, blood pressure 170/90, heart rate 64.  HEENT:  Normal.  NECK:  Normal carotid upstrokes without bruits.  Jugular venous pressure  is  normal.  LUNGS:  Clear to auscultation bilaterally.  HEART:  A regular rate and rhythm without murmurs or gallops.  ABDOMEN:  Soft, nontender.  No organomegaly.  EXTREMITIES:  No clubbing, cyanosis or edema.  Peripheral pulses are 2+  and equal throughout.   ASSESSMENT:  [name] is currently stable from a cardiovascular  standpoint.  Her cardiac issues are as follows:  1. Coronary artery disease:  She is on a reasonable regimen with      aspirin, Pravachol and now multiple antihypertensive medications.      We will recheck her lipids and liver function tests in eight to 12      weeks, and make medication adjustments accordingly.  2. Renal artery stenosis:  She has at least mild to moderate recurrent      renal artery stenosis, based on her Doppler velocities and renal to      aortic ratios.  I think a reasonable plan would be to increase her      antihypertensive medication and see her back in one  month, to see      how her blood pressure is doing.  If she has persistently elevated      blood pressure, we may need to perform a repeat aortogram and      selective renal angiogram.  3. Hypertension:  I will increase her Coreg to 6.25 mg b.i.d. and      start her on Norvasc 5 mg daily.  She should continue on her      Lisinopril/hydrochlorothiazide at her current dose.  I will be      hopeful that we can get her blood pressure under good control with      a combination of these medicines.  If she remains hypertensive, I      think she will require a re-look renal angiogram, because her renal      arterial ultrasound was technically challenging.   FOLLOWUP:  I will see Ms. Kaas back in one month or sooner if any new  problems arise.     Veverly Fells. Excell Seltzer, MD     MDC/MedQ  DD: 06/18/2006  DT: 06/18/2006  Job #: [id number]

## 2010-06-22 NOTE — Assessment & Plan Note (Signed)
[facility]                            CARDIOLOGY OFFICE NOTE   [name], [name]                        MRN:          [id number]  DATE:01/29/2006                            DOB:          Aug 18, 1945    REASON FOR PRESENTATION:  Evaluate patient with coronary disease and  hypertension.   HISTORY OF PRESENT ILLNESS:  The patient is a 65 year old [place]  American female who presents for followup of the above. She has had some  atypical chest discomfort. Some of this happened just last night. It was  a right-sided fleeting discomfort. She could pick up her right breast  and the pain was improved. She has not had any new substernal chest  pressure, neck discomfort, or arm discomfort. She has not had any  palpitations, or having pre-syncope or syncope. She has not had any PND  or orthopnea. She is fatigued. I questioned her grandson and she snores  loudly, though he has not witnessed apnea.   PAST MEDICAL HISTORY:  1. Hypertension.  2. Hyperlipidemia.  3. Coronary artery disease (status post stenting of a circumflex      vessel 10 years ago. The most recent catheterization in 2005,      demonstrated the left main 30%, distal stenosis; the left anterior      descending artery had 60% focal stenosis before small second      diagonal, 50% after a small third diagonal. The circumflex had a      previously placed stent with 30% to 40% in-stent re-stenosis. The      right coronary artery is diffusely diseased with the long 95%      proximal mid-stenosis. This was managed medically. The ejection      fraction of 60%.)  4. Renal artery stenosis (90% left renal artery stenosis, 80% right      renal artery stenosis. The patient had bilateral stenting by Dr.      Samule Ohm with 0% residual in November of 2007.)   ALLERGIES:  None.   MEDICATIONS:  1. Pravastatin 20 mg daily.  2. Aspirin 325 mg daily.  3. Metoprolol 25 mg b.i.d.  4. Hydrochlorothiazide 25 mg  daily.   REVIEW OF SYSTEMS:  As stated in the HPI and otherwise negative for  other systems.   PHYSICAL EXAMINATION:  The patient is in no distress. Blood pressure is  160/100, heart rate 56 and regular.  HEENT: Eyes unremarkable. Pupils equal, round, and reactive to light.  Fundi not visualized. Oral mucosa is unremarkable.  NECK: No jugular venous distention, waveform within normal limits.  Carotid upstroke brisk and symmetrical. No bruits, no thyromegaly.  LYMPHATICS: No cervical, axillary or inguinal adenopathy.  LUNGS: Clear to auscultation bilaterally.  BACK: No costovertebral angle tenderness.  CHEST: Unremarkable.  HEART: PMI not displaced or sustained. S1, S2 within normal limits. No  S3. No S4. No murmurs.  ABDOMEN: Flat, positive bowel sounds, normal in frequency and pitch. No  bruits. No rebounds. No guarding. No midline pulsatile mass. No  hepatomegaly, splenomegaly.  SKIN: No rashes, no nodules.  EXTREMITIES: 2+ pulses  throughout, bilateral femoral bruits.  NEURO: Oriented to person, place and time. Cranial nerves II-XII grossly  intact. Motor grossly intact.   ASSESSMENT/PLAN:  1. Hypertension. Her blood pressure is still difficult to control,      though she has had stenting of her renal arteries. At this point, I      am going to add lisinopril/hydrochlorothiazide and stop the      hydrochlorothiazide. This will be 20/25. She is going to get a BMET      on the 31st. She has had normal renal function prior to this. She      understands angioedema or cough side effects.  2. In addition, I am going to set her up for a sleep study as she may      have sleep apnea. She is fatigued. This could contribute to her      difficult to control hypertension.  3. Coronary disease. We are going to continue with secondary risk      reduction. She is back on the Plavix, which she was given at the      time of her stent. She will continue with this. She needs fasting      lipid  profile, but will defer her to her primary care doctors. If I      find that she has not had one, I will go ahead and order it the      next time I see her.  4. Followup. Again, will be checking her renal function. I would like      to see her back in 6 months or sooner if there are any problems.     Rollene Rotunda, MD, [facility]  Electronically Signed    JH/MedQ  DD: 01/29/2006  DT: 01/29/2006  Job #: [id number]

## 2010-06-22 NOTE — Assessment & Plan Note (Signed)
[facility]                              CARDIOLOGY OFFICE NOTE   KIMBRELY, BUCKEL                        MRN:          [id number]  DATE:12/03/2005                            DOB:          1945/11/08    PRIMARY:  Peggye Pitt, M.D.   REASON FOR PRESENTATION:  Evaluate patient with coronary disease and ongoing  cardiovascular risk factors.   HISTORY OF PRESENT ILLNESS:  The patient is a 65 year old African-American  female with coronary disease.  She was recently followed by [facility] but  was discharged from their practice because of financial matters.  She has  been followed at [facility] in the outpatient clinic.  She gets along fairly  well.  She recently stopped Plavix because of cost.  She is referred now to  discuss whether she needs this medication and to consider other therapies.  She was recently given a prescription for Pravastatin but has yet to get  this filled.  Her anginal symptom 10 years ago and in 2005, when she had her  last catheterization, seemed to be nausea with exertion.  She is not getting  that.  She will occasionally get nausea at rest with a water brash  sensation.  She gets some heartburn as well.  She does get some shortness of  breath with activities such as walking with garbage cans to the curb.  This  seems to be a slowly progressive thing rather than an acute problem.  There  is no resting shortness of breath, no PND or orthopnea.  She does not have  any palpitations, presyncope or syncope.   It is clear that the patient is pleasant and wants to think about lifestyle  changes, but currently she does not watch her diet or exercise.   PAST MEDICAL HISTORY:  1. Hypertension since 1989.  2. Hyperlipidemia.  3. Coronary artery disease (Status post stenting to a circumflex vessel      about 10 years ago.  A most recent catheterization in 2005 demonstrated      the left main 30% distal stenosis, the LAD had a  60% focal stenosis      before a small second diagonal, a 50% after a small third diagonal,      circumflex had a previously placed stent with 30-40% in stent      restenosis.  The right coronary artery was diffusely diseased with long      95% proximal and mid stenosis.  This was managed medically.  The EF was      60%.  An abdominal aortogram demonstrated 70-80% bilateral renal artery      stenosis.  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   ALLERGIES:  NONE.   MEDICATIONS:  1. Norvasc 10 mg a day.  2. Hydrochlorothiazide.  3. Aspirin 325 mg daily.   SOCIAL HISTORY:  She works as a Patent examiner.  She has 3 children.  She  lives with her grandson.  She stopped smoking cigarettes after a 2 pack per  day 15 year history.  She quit in 1994.   FAMILY HISTORY:  Noncontributory for early coronary artery disease.   REVIEW OF SYSTEMS:  As stated in the HPI, positive for mild cough, mild  joint pains.  Negative for other systems.   PHYSICAL EXAMINATION:  The patient is in no distress.  Blood pressure  176/100, heart rate 84 and regular, weight 218 pounds, body mass index 37.  HEENT:  Eyelids unremarkable, pupils equal, round and react to light, fundi  within normal limits, oral mucosa unremarkable.  NECK:  No jugular venous distention, wave form within normal limits.  Carotid upstroke brisk and symmetric, no bruits, no thyromegaly.  LYMPHATICS:  No cervical, axillary or inguinal adenopathy.  LUNGS:  Clear to auscultation bilaterally.  BACK:  No costovertebral angle tenderness.  CHEST:  Unremarkable.  HEART:  The PMI not displaced or sustained, S1 and S2 within normal limits.  No S3, no S4, no murmurs.  ABDOMEN:  Obese, positive bowel sounds normal in frequency and pitch, no  bruits, no rebound, no guarding, no midline pulses or masses, no  hepatomegaly, no splenomegaly.  SKIN:  No rashes, no nodules.  EXTREMITIES:  Pulses 2+  throughout, no edema, no cyanosis, no clubbing.  NEURO:  Oriented to person, place and time.  Cranial nerves II-XII grossly  intact, motor grossly intact.   EKG:  Sinus rhythm, rate 84, axis within normal limits, intervals within  normal limits, diffuse T wave flattening nonspecific.   ASSESSMENT AND PLAN:  1. Coronary disease.  The patient has coronary disease as described below.      However, she does not have any active symptoms consistent with unstable      angina.  She needs very aggressive medical management and risk      reduction.  We had a long discussion (greater than 1/2 hour) regarding      these things.  I would suggest the Plavix.  I would suggest the other      medicines as listed.  I will also add metoprolol as below.  2. Obesity.  I had a long discussion regarding diet and exercise and      hopefully she can comply with this.  We gave her some information.  3. Dyslipidemia.  I agree with the Pravastatin with a goal LDL less than      100 and HDL in the 50's.  4. Hypertension.  I am going to add metoprolol 25 mg twice daily.  This is      a $4 drug as well.      Therefore, the only expensive medication she would have would be      Plavix.  5. Follow up.  I would like to see her back in about 3 months or sooner if      needed.    ______________________________  Rollene Rotunda, MD, [facility]    JH/MedQ  DD: 12/03/2005  DT: 12/03/2005  Job #: [id number]   cc:   Peggye Pitt, M.D.

## 2010-06-22 NOTE — Discharge Summary (Signed)
NAME:  [name], [name]                           ACCOUNT NO.:  [id number]   MEDICAL RECORD NO.:  [id number]                   PATIENT TYPE:  INP   LOCATION:  5524                                 FACILITY:  [facility]   PHYSICIAN:  Pramod P. Pearlean Brownie, MD                 DATE OF BIRTH:  1945/07/31   DATE OF ADMISSION:  08/19/2003  DATE OF DISCHARGE:  08/22/2003                                 DISCHARGE SUMMARY   DISCHARGE DIAGNOSES:  1. Small left posterior frontal infarction, secondary to small vessel     disease.  2. Hypertension.  3. Hypercholesterolemia.  4. Elevated hemoglobin A1c, question type 2 diabetes.  5. History of cardiac stent.  6. History of open reduction, internal fixation of left ankle fracture.   DISCHARGE MEDICATIONS:  1. Aspirin 325 mg daily.  2. Lotrel 10/5 mg daily.  3. Hydrochlorothiazide 25 mg daily.   LABORATORY DATA:  Homocystine 6.8, cholesterol 182, triglycerides 178, HDL  28, LDL 118.  Hemoglobin A1c of 6.5.  CBC normal.  Differential normal.  Coagulation studies normal.  Chemistry normal except for a glucose elevated  at 110.  Liver function tests normal.   STUDIES PERFORMED:  1. A CT of the head on admission showed a small bilateral basal ganglia     lacune which appears old.  2. A chest x-ray shows mildly prominent heart size.  No active disease.  3. MRI of the brain shows a 1.0 to 1.5 cm acute stroke in the high left     parietal cortical region near the vertex.  It is felt to be embolic in     nature per the radiologist.  4. MRA of the brain shows normal intracranial MRA of the large and medium-     sized vessels.  5. MRA of the neck shows carotid bifurcations widely patent with only     minimal atherosclerotic irregularity.  No dissection or lesion that would     expect to result in embolic disease.  The brachiocephalic vessel origins     are not very well evaluated.  6. Carotid Doppler is normal.  7. A 2-D echocardiogram has been completed.  The  results are pending.  8. A trans-cranial Doppler was cancelled.  9. Electrocardiogram shows a normal sinus rhythm with nonspecific T-wave     abnormality.  No old tracing to compare.   HISTORY OF PRESENT ILLNESS:  The patient is a 65 year old right-handed  African-American female who awoke on the morning of admission at 6:30 a.m.  At 7 a.m. she noted the sudden onset of right leg numbness and weakness.  She notes that she was dragging her leg for most of the day.  At 9:30 a.m.  she had the onset of right arm heaviness.  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  with the complaints of chest pain.  Her symptoms in the  morning began with brief episodes of substernal chest pain that did not  persist.  Her true complaint was not listed until the emergency room  physician saw her at 9 p.m.  Neurology was consulted at 9:35 p.m. for a  possible stroke.  A CT of the head showed an old stroke with no acute  hemorrhage or acute abnormalities.  She was admitted for further workup.   HOSPITAL COURSE:  An MRI did reveal an acute infarction.  It was felt to be  secondary to small vessel  disease per Dr. Janalyn Shy P. Sethi.  She has a  history of hypertension and her blood glucose and cholesterol were found to  be borderline.  She has a history of smoking, but quit five years ago.  Other workup was negative for possible etiology.  Her symptoms resolved, and  the patient was stable to return home.  She will need followup for risk factor disease modification.   CONDITION ON DISCHARGE:  Alert and oriented x3.  No cranial nerve deficits.  There is some mild right leg weakness, but can walk safely.  She can balance  on each foot individually.  Her sensation is normal.  Her chest is clear.  Her heart rate is regular.  No aphasia.   DISCHARGE PLAN:  1. Discharge home.  May return to work.  2. Aspirin 325 mg for secondary stroke  prophylaxis.  3. Consider starting a statin.  Her total LDL is less than 100.  Will adjust     her diet and follow up the clinic and add medicine if needed.  4. Elevated hemoglobin A1c needs followup for evaluation for type 2 diabetes     and treatment as appropriate.  5. Continue hypertension control.  6. Follow up with Dr. Pearlean Brownie in two to three months.      [name], N.P.                         Pramod P. Pearlean Brownie, MD    SB/MEDQ  D:  08/22/2003  T:  08/22/2003  Job:  [id number]   cc:   Pramod P. Pearlean Brownie, MD  Fax: [phone]   [facility]   Internal Medicine Teaching Service - [facility]

## 2010-06-22 NOTE — Discharge Summary (Signed)
[name], [name]                 ACCOUNT NO.:  [id number]   MEDICAL RECORD NO.:  [id number]          PATIENT TYPE:  INP   LOCATION:  6524                         FACILITY:  [facility]   PHYSICIAN:  Rollene Rotunda, MD, FACCDATE OF BIRTH:  [birth year]   DATE OF ADMISSION:  12/20/2005  DATE OF DISCHARGE:  12/24/2005                           DISCHARGE SUMMARY - REFERRING   DISCHARGE DIAGNOSIS:  1. Acute coronary syndrome without evidence of progressive coronary artery      disease by cardiac catheterization.  2. Progressive renal artery stenosis status post bilateral renal artery      stenting.  3. Hypertension.  4. Hypokalemia.  5. History ass noted below.   PROCEDURE PERFORMED:  Cardiac catheterization with selective renal angiogram  with bilateral renal artery stenting by Dr. Samule Ohm on December 23, 2005.   SUMMARY OF HISTORY:  [name] is a 65 year old African American female who  presents with a 7-day history of exertional shortness of breath and nausea  and mid scapular discomfort.  These were also associated with diaphoresis  and vomiting.  The symptoms have been occurring after meals but also with  walking and lasts about 5 minutes, resolving with rest.  She feels it is  similar to her angina equivalent.  On the day of admission she took  sublingual nitroglycerin with immediate relief and she fell off to sleep.  However, when she woke up she called our office and was told to come to the  emergency room.  EKG showed some new changes.  It was felt that she should  be admitted and undergo cardiac catheterization.   PAST MEDICAL HISTORY:  Her past medical history is notable for hypertension,  hyperlipidemia and known bilateral renal artery stenosis with peripheral  vascular disease, obesity, remote tobacco use, CVA versus TIA in 2005,  coronary artery disease with stenting to the circumflex in 1998.  Last cath  in December 2005.   LABORATORY:  Chest x-ray on the 16th showed mild  cardiac prominence, no  acute abnormalities.  The admission weight was 213.9 pounds.  H&H 13.2 and  38.4, normal indices, platelets of 316, WBCs 7.5. Prior to discharge H&H was  11.8, 35.0, normal indices, platelets 282, WBCs 7.6.  Admission PTT was 77,  PT 13.3, INR 1.0, I-stat  showed a sodium of 141, potassium 3.0, BUN 13,  creatinine 1.0. C-met showed a potassium of 2.8, normal LFTs.  Chemistry  prior to discharge showed a sodium 140, potassium 4.4, BUN 10, creatinine  0.9, glucose 122.  CK MBs and relative index and troponins were negative for  myocardial infarction x3.  Fasting lipids showed a total cholesterol 165,  triglycerides 174, HDL 32, LDL 98.  TSH was elevated 11.941.  However, free  T4 was 6.7 and T3 was 154.2.  EKG show sinus bradycardia, normal sinus  rhythm, normal axis, nonspecific ST-T wave changes as well as lateral T-wave  inversion.   HOSPITAL COURSE:  [name] was admitted to [facility] by Dr.  Antoine Poche and Ward Givens, nurse practitioner.  She was placed on her home  medications as  well as IV heparin. Overnight she felt better without any  further discomfort.  She had ruled out for myocardial infarction.  Her  potassium had been replaced.  It was felt that she should undergo cardiac  catheterization for further evaluation of her symptoms.  Pastoral care  offered support.  Cardiac catheterization performed on December 23, 2005 by  Dr. Gala Romney revealed an old proximal 90% RCA with good left-to-right  collaterals.  No in-stent restenosis to the circumflex, 30% distal left  main, 50% mid LAD.  EF was 65% without wall motion abnormalities.  Dr.  Samule Ohm took a look at the renal.  She had a 90% left renal artery stenosis,  80% right renal artery stenosis.  She received bilateral stenting reducing  this to 0%.  Dr. Samule Ohm recommended Plavix for 30 days, aspirin for life and  to add an ACE inhibitor the day after the procedure and to follow-up blood  pressures  at home.  Postprocedure Dr. Samule Ohm felt that her catheterization  site was intact.  By December 24, 2005 Dr. Antoine Poche felt that the patient  was stable for discharge.  Catheterization site was intact.  Kidney function  was okay.  Blood pressure remained elevated between 140s and 150s.  However,  Dr. Antoine Poche noted per his palm there was one erroneous reading of 109  systolically.  These were to be all where he was   DISPOSITION:  Ms. Haese is asked to maintain low salt fat cholesterol diet.  Activities and wound care per supplemental discharge sheet.  She was asked  to check her blood pressure twice a day, write these down and to bring all  her medications and blood pressure readings to all appointments.  Her  Norvasc was decreased to 5 mg daily.  She is asked to continue on HCTZ 25 mg  daily, aspirin 325 daily, Plavix 75 mg daily for 30 days, then discontinue.  Lopressor 25 mg b.i.d., Pravachol 20 mg nightly, nitroglycerin 0.4 as  needed.  She was also given potassium supplement at 20 mg daily given her  hypokalemia and being on HCTZ.  She will follow up with Dr. Antoine Poche in a  week on December 31, 2005 at 1:30.  Just prior to this appointment she will  have a B-met drawn at 1 o'clock to reassess her kidney function and  potassium.  Discharge time greater than 30 minutes.      Joellyn Rued, PA-C      Rollene Rotunda, MD, [facility]  Electronically Signed    EW/MEDQ  D:  12/24/2005  T:  12/24/2005  Job:  [id number]   cc:   Rollene Rotunda, MD, [facility]  Salvadore Farber, MD  Peggye Pitt, M.D.

## 2010-06-22 NOTE — H&P (Signed)
[place]  Patient:    [name], [name]                       MRN: [id number] Adm. Date:  07/13/99 Attending:  Kennieth Rad, M.D.                         History and Physical  CHIEF COMPLAINT:  Painfully deformed left ankle.  HISTORY OF PRESENT ILLNESS:  This is a 65 year old who had turned her ankle outside but caught herself since without falling and developed severe pain and swelling of her left ankle.  The patient came to [facility] for treatment.  PAST MEDICAL HISTORY:   High blood pressure and angina.  Surgery for placement of a stent.  HABITS:  Last smoked three years ago.  FAMILY HISTORY:  High blood pressure and diabetes in her mother.  REVIEW OF SYSTEMS:  The patient has been doing quite well up until today.  MEDICATIONS:  The patient had been on antihypertensive medication but ran out several weeks ago and has not had it since.  Also, she had been using sublingual nitroglycerin of which she also ran out.  ALLERGIES:  None known.  PHYSICAL EXAMINATION:  VITAL SIGNS:  Temperature 98.5, pulse 85, respirations 18, blood pressure 162/90.  HEENT:  Normocephalic.  Eyes: Conjunctivae and sclerae clear.  NECK:  Supple.  CHEST:  Clear.  CARDIAC:  S1 and S2 regular.  EXTREMITIES:  Left ankle diffusely swollen and deformed.  Pulses intact. Tender medial and lateral compartment of the ankle, sluggish toe movement.  LABORATORY DATA:  X-ray revealed bimalleolar fracture of the left ankle.  IMPRESSION:  Bimalleolar fracture of the left ankle. DD:  07/13/99 TD:  07/13/99 Job: [id number]

## 2010-06-22 NOTE — Cardiovascular Report (Signed)
NAME:  [name], [name]                 ACCOUNT NO.:  [id number]   MEDICAL RECORD NO.:  [id number]          PATIENT TYPE:  INP   LOCATION:  6524                         FACILITY:  [facility]   PHYSICIAN:  Salvadore Farber, MD  DATE OF BIRTH:  1945-10-25   DATE OF PROCEDURE:  12/23/2005  DATE OF DISCHARGE:                              CARDIAC CATHETERIZATION   PROCEDURE:  Left heart catheterization, left ventriculography, coronary  angiography, selective bilateral renal angiography, stenting of both the  left and the right renal artery.   INDICATIONS:  Ms. Cranmer is a  65 year old woman with coronary artery disease  status post bare metal stenting of the circumflex in 1998 and long segment  disease of a small right coronary artery.  She also has bilateral renal  artery stenoses which were 60% in February 2006.  She now presents with  diastolic heart failure, manifested as 7 days of exertional dyspnea,  accompanied by mid scapular pain.  On admission, she was profoundly  hypertensive with a blood pressure 188/182 despite compliance with  substantial doses of three medications.  During hospitalization, she is  remained profoundly hypertensive with blood pressure as high as 220/110.  She was referred for diagnostic angiography and renal angiography and  potential coronary and/or renal intervention.   PROCEDURAL TECHNIQUE:  Informed consent was obtained.  Specifically,  informed consent was obtained for possible renal intervention depending on  findings of the study.  Under 1% Lidocaine local anesthesia, a 5-French  sheath was placed in the right common femoral artery using modified  Seldinger technique.  Diagnostic angiography and ventriculography were  performed using JL-4 and AL-1 catheters for the native coronaries, and  pigtail catheter for left heart catheterization and ventriculography.  The  pigtail catheter was then pulled back down to the suprarenal abdominal  aorta.  Abdominal  aortography was performed by power injection.  This  demonstrated critical stenosis of the ostium of the left renal artery and a  severe stenosis of the right renal artery.  Decision was made to proceed to  bilateral renal inner revascularization with a goal of improving her  hypertension and in turn improving her diastolic heart failure.   Additional heparin was given to achieve an ACT of greater than 225 seconds.  Sheath was up sized to 6-French.  A 6-French LIMA guide was advanced over  wire and engaged in the left renal artery using a no-touch technique.  The  lesion was dilated using a 5x 15 mm aviator balloon.  After this dilation,  it was clear that the vessel was more than 6 mm in diameter.  I therefore  removed guide and wire and up sized the sheath to 7-French over a wire.  I  then used a 7-French LIMA guide and selectively engaged the left renal  artery.  I rewired it with the stabilizer wire without difficulty.  I then  stented it using a 6.5x15 mm Herculink stent deployed at 12 atmospheres.  I  pulled it back to the ostium and further inflated it to 14 atmospheres.  Repeat angiography demonstrated no residual stenosis,  no dissection, and  normal flow to the renal parenchyma.   Attention was then turned to the right renal artery.  The guide was  positioned just outside the right renal artery and the stabilizer wire  passed beyond the stenosis without difficulty.  I then directly stented the  lesion using a 7x15 mm Herculink stent deployed a 14 atmospheres.  Repeat  angiography demonstrated no residual stenosis, no dissection, and normal  flow to the renal parenchyma.   The arteriotomy was then closed using an AngioSeal device.  Complete  hemostasis was obtained.  The patient was then transferred to the holding  room in stable condition having tolerated the procedure well.   COMPLICATIONS:  None.   FINDINGS:  1. LV:  170/4/10.  EF 65% without regional wall motion  abnormality.  2. No aortic stenosis or mitral regurgitation.  3. Left main:  30% stenosis distally.  4. LAD:  Moderate-sized vessel giving rise to three small diagonals.      There is a focal 50% stenosis at in the LAD at the site of origin of      the second diagonal.  5. Circumflex:  Fairly large vessel giving rise to a large OM.  There is a      widely patent stent in the mid vessel with no in-stent restenosis.  6. RCA:  Small though dominant vessel.  There is a long 90% stenosis      proximally and a 2 mm vessel.  The distal vessel was small and felt      fairly robust left-to-right collaterals.  This is unchanged from 2005.  7. Renal arteries:  Single vessels bilaterally.  The right had an 80%      stenosis stented to no residual.  The left had a 90% stenosis stented      to no residual.   IMPRESSION/PLAN:  The patient has moderate nonobstructive coronary disease  and preserved left ventricular systolic function.  Due to her diastolic  heart failure in the setting of malignant hypertension, I proceeded to  stenting of both renal arteries.  She should be maintained on Plavix for 30  days.  Aspirin should be continued for life.  Will plan on initiation of ACE  inhibitor tomorrow assuming renal function remained stable.  She should  check her blood pressure daily at home.      Salvadore Farber, MD  Electronically Signed     WED/MEDQ  D:  12/23/2005  T:  12/23/2005  Job:  [id number]   cc:   Peggye Pitt, M.D.  Rollene Rotunda, MD, [facility]

## 2010-06-22 NOTE — Discharge Summary (Signed)
[name], [name]                 ACCOUNT NO.:  [id number]   MEDICAL RECORD NO.:  [id number]          PATIENT TYPE:  INP   LOCATION:  4735                         FACILITY:  [facility]   PHYSICIAN:  Nanetta Batty, M.D.   DATE OF BIRTH:  September 13, 1945   DATE OF ADMISSION:  01/26/2004  DATE OF DISCHARGE:  01/28/2004                                 DISCHARGE SUMMARY   DISCHARGE DIAGNOSES:  1.  Coronary artery disease, status post catheterization done by Dr. Gery Pray      at [facility].  2.  Hypertension.  3.  Hyperlipidemia.  4.  Hypothyroidism.  5.  Obesity.  6.  Diabetes mellitus type 2.  7.  History of tobacco abuse.   DISCHARGE MEDICATIONS:  1.  Protonix 40 mg p.o. daily.  2.  Aspirin 81 mg p.o. daily.  3.  Zocor 80 mg p.o. daily.  4.  HCTZ 25 mg p.o. daily.  5.  Metoprolol 25 mg p.o. b.i.d.  6.  Potassium chloride 30 mEq p.o. daily.  7.  Nitroglycerin 0.4 mg p.o. every 5 minutes x3 p.r.n. for chest pain.  8.  Plavix 75 mg p.o. daily.  9.  Zetia 10 mg p.o. daily.   PROCEDURES:  Cardiac catheterization on January 28, 2004.   IMPRESSION:  Cardiac catheterization was hemodynamic with aortic systolic  pressure 227, diastolic pressure 125, less than __________, systolic  pressure 227, and diastolic pressure 23.  Elective coronary angiography  showed one left main with 30% distal stenosis.  To LAD, several small  diagonal branches 60% with a focal stenosis just before the second small  diagonal branch and a 50% just after the third small diagonal branch.  In  left circumflex, previously placed stent was widely patent with at most 30  or 40% in-stent restenosis and right coronary artery appeared to be  diffusely diseased vessel with a long 95% stenosis in the proximal mid-  portion, as well as in the distal portion and the crux and in the PD and  PLA.  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  Distal abdominal  aortography showed that her renal artery stenosis moderate infrarenal  abdominal aortic atherosclerotic changes, now with no aneurysm dilatation or  significant stenosis.   HISTORY AND PHYSICAL:  [name] is a 65 year old African-American lady with  a past medical history significant for coronary artery disease, status post  stent placement in 1998 by Dr. Nicki Guadalajara secondary to 95% stenosis of  circumflex artery and also at that time the patient had RCA with 30-40%  narrowing.   PAST MEDICAL HISTORY:  The patient had CVA, hypertension, and  hyperlipidemia.  She presented to the [facility] because of one episode of chest  pain, 5/10 in intensity, substernal, radiated to the back, associated with  nausea and palpitations.  The patient denied any diaphoresis or shortness of  breath.  She had a similar episode of palpitations and chest pain in 1998  when she underwent previous catheterization and stent of the circumflex  artery.   PHYSICAL EXAMINATION:  VITAL SIGNS:  Heart rate 68, blood pressure 189/103  on the right and 187/105 on the left, temperature 98.4, respiratory rate 20;  O2 saturation 98% on room air.  GENERAL:  In no acute distress.  EYES:  PERRLA, extraocular movements intact.  ENT:  Clear oropharynx.  NECK:  No jugular venous distention, no bruits.  RESPIRATORY:  Clear to auscultation bilaterally.  CARDIOVASCULAR:  Regular rate and rhythm, no murmurs, no rubs, no gallops.  GASTROINTESTINAL:  Bowel sounds present.  ABDOMEN:  Diffusely tender to palpation.  EXTREMITIES:  No edema.  MUSCULOSKELETAL:  Intact.  NEUROLOGIC:  Nonfocal neurologic deficits and psychologically oriented x3.   LABORATORY DATA ON ADMISSION:  Sodium 140, potassium 3.4, chloride 103,  bicarb 30, BUN 8, creatinine 0.9, glucose 118.  White blood cells 6,  hemoglobin 13.2, hematocrit 38.9, platelets 309,000.  EKG showed normal  sinus rhythm, normal axis, PR interval 208, QRS 88,  T-wave inversion in V4-  V6.   IMPRESSION:  First degree heart block and lateral ischemia.   HOSPITAL COURSE:  Problem 1. Chest pain, palpitations, and inferior ischemia  on EKG.  The patient received aspirin, was placed on nitroglycerin drip, and  we consulted cardiology at [facility], Dr. Allyson Sabal.  The patient underwent  cardiac catheterization.  Please see the cardiac catheterization report.   Problem 2. Hypertension.  The patient was on HCTZ at home.  Because of high  blood pressure, she was started on nitroglycerin drip.  Her blood pressure  improved and she was discharged to home on HCTZ, and metoprolol, to followup  with her primary care doctor in [facility] for further assessment and plan.  She  would probably need stent placement on the renal artery for bilateral renal  artery stenosis.   Problem 3. Hyperlipidemia.  The patient was previously on Zocor.  Fasting  lipid panel on admission showed levels of total cholesterol 213,  triglycerides 163, HDL 36, LDL 144.  She will continue Zocor at 80 mg p.o.  daily.  She will follow up in [facility] with primary care doctor for further  assessment and plan.   Problem 4. First degree heart block.  Was stable and asymptomatic.  The  patient was admitted to telemetry.  She will follow up with primary care  doctor for further assessment and plan.   Problem 5. History of hypothyroidism.  We checked the TSH at this admission  which showed a value of 4.6, 24, and free T4 1.01.  Both of them being  within normal limits, we did not see that the patient needs treatment.  She  will follow up with primary care physician.   Problem 6. Hypercoagulable state.  Because of multiple atherosclerotic  plaques, both on coronaries and aorta with past history of CVA, we checked  homocysteine levels which were low at 4.88.  We also checked  antiphospholipid antibodies which were negative and anticardiolipin antibodies which were negative.  The patient will follow up  with primary  care physician for further assessment and plan.   Problem 7. Diabetes mellitus.  During hospitalization, we checked the  hemoglobin A1C and it was 6.5.  She is relatively well controlled on diet.  She will follow up with primary care physician for further assessment and  treatment.   Problem 8. History of smoking.  We encouraged the patient to continue  abstaining from smoking and she will follow up with her primary care doctor.   DISCHARGE LABORATORY DATA:  Hemoglobin  11.1, hematocrit 32.6, platelets  290,000, white blood cells 6.9.  Sodium 139, potassium 3.5, chloride 106,  BUN 9, creatinine 0.9, glucose 124.      DA/MEDQ  D:  02/14/2004  T:  02/14/2004  Job:  [id number]

## 2010-06-22 NOTE — Op Note (Signed)
[place]. [facility]  Patient:    [name], [name]                        MRN: [id number] Proc. Date: 07/13/99 Adm. Date:  [id number] Disc. Date: [id number] Attending:  Wende Mott                           Operative Report  PREOPERATIVE DIAGNOSIS:  Bimalleolar fracture, left ankle.  POSTOPERATIVE DIAGNOSIS:  Bimalleolar fracture, left ankle.  PROCEDURE:  Open reduction and internal fixation of bimalleolar fracture, left ankle, and application of short-leg cast.  SURGEON:  Kennieth Rad, M.D.  ASSISTANT:  ANESTHESIA:  General.  DESCRIPTION OF PROCEDURE:  The patient was taken to the operating room after given adequate preoperative medication and given general anesthesia and intubated.  The left lower leg and ankle were prepped with DuraPrep and draped in a sterile manner.  Tourniquet and Bovie used for hemostasis.  A C-arm was used to visualize fracture reduction.  A lateral incision was made over the left ankle, going through the skin and subcutaneous tissues down to the fracture site.  Copious irrigation was done.  The subperiosteum was elevated from the fracture site.  The fracture was reduced, followed by placement of a seven-hole tubular plate and placement of six screws across the fracture site. Anatomic reduction was possible and the fracture was good and stable.  next, a cochlear incision was made over the medial malleolus, going through the skin and subcutaneous tissues down through the soft tissue.  The soft tissue was periosteal elevated from the fracture site.  The fracture site was explored. The ankle joint was explored.  Copious irrigation was done.  Reduction of the medial malleolar fracture was done and stabilized with a towel clamp.  Two cannulated screws were placed across the fracture site, holding it in anatomic and stable position.  Further copious irrigation was done.  Wound closure was then done.  Wound closure was then  done with 2-0 Vicryl for the subcutaneous and skin staples for the skin for both medial and lateral wounds.  A compressive dressing was applied and application of a short-leg cast.  The patient tolerated the procedure quite well and went to the recovery room in stable and satisfactory condition. DD:  07/13/99 TD:  07/17/99 Job: [id number]

## 2010-06-22 NOTE — Procedures (Signed)
NAME:  [name], [name]                 ACCOUNT NO.:  [id number]   MEDICAL RECORD NO.:  [id number]          PATIENT TYPE:  OUT   LOCATION:  SLEEP CENTER                 [facility]ILITY[facility]  [facility]   PHYSICIAN:  Barbaraann Share, MD,FCCPDATE OF BIRTH:  1945-09-04   DATE OF STUDY:                            NOCTURNAL POLYSOMNOGRAM   LOCATION:  Sleep lab.   REFERRING PHYSICIAN:  Rollene Rotunda, MD, [facility].   INDICATIONS FOR THE STUDY:  Hypersomnia with sleep apnea.   EPWORTH SLEEPINESS SCORE:  21.   SLEEP ARCHITECTURE:  The patient had a total sleep time of 434 minutes  with no slow wave sleep and decreased REM.  Sleep onset latency was  normal at 3.5 minutes, and REM onset was normal at 70 minutes.  Sleep  efficiency was excellent at 94%.   RESPIRATORY DATA:  The patient was found to have 134 hypopneas and 72  obstructive apneas for a Respiratory Disturbance Index of 29 events per  hour.  The events were clearly worse during REM and were associated with  moderate snoring throughout.  The patient did not meet split-night  criteria secondary to the majority of her events occurring after 12  midnight.   OXYGEN DATA:  There was O2 desaturation as low as 86% with the patient's  obstructive events.   CARDIAC DATA:  Occasional PVCs was noted, but nothing of clinical  significance.   MOVEMENT-PARASOMNIA:  Rare leg jerks without clinical significance.   IMPRESSIONS-RECOMMENDATIONS:  1. Moderate obstructive sleep apnea/hypopnea syndrome with a      Respiratory Disturbance Index of 29 events per hour and O2      desaturation as low as 86%.  The events were worse during REM and      moderate snoring noted throughout.  The patient did not meet split-      night criteria secondary to the majority of her events occurring      after 12 midnight.  Treatment for this degree of sleep apnea should      focus primarily on weight loss alone, if      applicable, upper airway surgery, oral appliance, and also  CPAP.  2. Occasional premature ventricular contraction noted.      Barbaraann Share, MD,FCCP  Diplomate, American Board of Sleep  Medicine  Electronically Signed     [facility]/MEDQ  D:  03/12/2006 16:11:55  T:  03/12/2006 21:21:05  Job:  [id number]

## 2010-06-22 NOTE — H&P (Signed)
NAME:  [name], [name]                           ACCOUNT NO.:  [id number]   MEDICAL RECORD NO.:  [id number]                   PATIENT TYPE:  EMS   LOCATION:  [facility]                                 FACILITY:  [facility]   PHYSICIAN:  Deanna Artis. Sharene Skeans, M.D.           DATE OF BIRTH:  Jan 07, 1946   DATE OF ADMISSION:  08/19/2003  DATE OF DISCHARGE:                                HISTORY & PHYSICAL   CHIEF COMPLAINT:  Dragging right leg.   HISTORY OF PRESENT ILLNESS:  This 65 year old right-handed African American  woman awakened at 06:30 with onset of right leg numbness and weakness at  07:00. She was dragging her leg most of the day. Around 9:30 she had right  arm heaviness. She resisted coming to the emergency room until her daycare  was closed and then arrived around 19:23 with complaints of chest pain. Her  symptoms this morning began with brief episodes of substernal chest pain  that did not persist. Therefore, her true complaint was not elicited until  the emergency physician saw her at 21:00. I was consulted at 21:35 to  evaluate her for possible stroke. A cranial CT was done at 21:42.  The  patient's symptoms largely subsided during the past couple of hours.   PAST MEDICAL HISTORY:  A sixteen year history of hypertension. No diabetes  or cholesterol problems. The patient quit smoking five years ago. She had a  cardiac stent placed at that time for angina.   PAST SURGICAL HISTORY:  Open reduction, internal fixation of left ankle  fracture.   MEDICATIONS:  1. Lotrel 5/10 one per day.  2. Hydrochlorothiazide 25 mg per day.  3. Aspirin 325 mg per day.   DRUG ALLERGIES:  None.   FAMILY HISTORY:  Father died of Alzheimer disease. Mother has hypertension,  osteoarthritis, and is otherwise well. There is no history of stroke or  cancer in the family.   SOCIAL HISTORY:  The patient lives alone. She has a daycare center. There  are many family members, including siblings and children in  this area. She  does not use alcohol or drugs.   PHYSICAL EXAMINATION:  VITAL SIGNS: Blood pressure 190/107, resting pulse  65, respirations 24, temperature 98.7, O2 saturation 100%.  ENT: No bruits, meningismus, or infection.  LUNGS: Clear.  HEART: No murmurs. Pulses normal.  ABDOMEN: Soft, bowel sounds normal. The abdomen is somewhat protuberant. No  hepatosplenomegaly.  EXTREMITIES: Unremarkable for edema, cyanosis, or altered tone.  NEUROLOGIC: Mental status--awake and alert. No dysphagia or dyspraxia.  Cranial nerves--round and reactive pupils. Fundi normal. Visual fields full  to double simultaneous stimuli. Symmetrical facial strength. Midline tongue  and uvula. Air conduction greater than bone conduction bilaterally. Motor  exam--normal strength, tone, and mass. Good fine motor movements. No  pronator drift. Sensory--intact to primary and cortical modalities.  Cerebellar exam--good finger-to-nose and rapid repetitive movements. Gait is  slightly broad based, slightly  externally rotated, her right foot but she  did not drag it.  Deep tendon reflexes are diminished. The patient has  bilateral flexor plantar responses.   IMPRESSION:  1. Transient ischemic attack, left brain, 435.8. CT scan of the brain is     normal.  2. Hypertension, severe, 441.00.  3. Obesity, 278.00.  4. Status post cardiac stent.   PLAN:  MRI of brain, MRA intracranial and extracranial. Labs to look for  diabetes, hypercholesterolemia, homocystinuria. Carotid Doppler, 2-D  echocardiogram which will have to be done next week. We will gently treat  her blood pressure, continue her aspirin, consider switching to Aggrenox.  She may be able to go home within a day.                                                Deanna Artis. Sharene Skeans, M.D.    [facility]  D:  08/19/2003  T:  08/19/2003  Job:  [id number]

## 2010-06-22 NOTE — Assessment & Plan Note (Signed)
[name]                            CARDIOLOGY OFFICE NOTE   Teresa, [name]                        MRN:          [id number]  DATE:05/28/2006                            DOB:          1945/04/24    CARDIOLOGIST:  She used to be followed by Dr. Antoine Poche, however he has  discharged her from his practice.  She will be new to Dr. Tonny Bollman.   PRIMARY CARE PHYSICIAN:  She is trying to re-establish herself at [facility].   HISTORY OF PRESENT ILLNESS:  [name] is a 65 year old female patient  previously followed by Dr. Antoine Poche with a history of coronary artery  disease, status post bare metal stenting in the circumflex in 1998.  Her  last cardiac catheterization was done in November 2007 secondary to  chest pain in the setting of profound hypertension.  At that time she  had an EF of 65%, 30% distal left main stenosis, 50% stenosis of the LAD  at the origin of the second diagonal, circumflex with a widely patent  stent in the mid vessel, RCA with long 90% stenosis proximally in a 2-mm  vessel and the distal vessel filled with robust left-to-right  collaterals.  This was unchanged from 2005.  She had bilateral renal  artery stenosis at that time with 80% on the right and 90% on the left.  Both of these renal arteries were stented with no residual stenosis.  Her coronary disease was treated medically.  She has actually seen Dr.  Antoine Poche back once since that admission.  He saw her in December 2007.  At that time he was going to switch her to Lisinopril/HCT 20 mg/25 mg a  day.  The patient has missed several appointments since that time and  due to that she has been discharged from Dr. Jenene Slicker practice.  She  did finally make a follow-up appointment today with me.   The patient notes a recent complaint of nausea with walking.  She said  she had this prior to her stenting some 10 years ago, however at  that  time she would walk and get nauseated and symptoms became so severe that  she had to stop and rest and it improved with rest.  Currently she  describes symptoms of nausea with walking when she first starts to walk.  She actually just got back to walking recently.  The nausea goes away  within the first minute or two of walking and she feels fine throughout  the rest of her walk.  By the time she finishes her walk, she feels  great.  She does note some shortness of breath when she first starts  out.  She denies any chest discomfort, arm or jaw pain.  She denies any  syncope or near syncope.  She denies orthopnea or paroxysmal nocturnal  dyspnea.  She does note some palpitations with initiation of her  activity.  She denies any edema.   MEDICATIONS:  She is unsure about what she is taking at home really.  Our list includes-  1. Hydrochlorothiazide 25 mg daily.  2. Metoprolol 25 mg twice daily.  3. Aspirin 325 mg twice daily.  4. Prilosec 20 mg daily.  5. Nitroglycerin p.r.n. chest pain.   ALLERGIES:  NO KNOWN DRUG ALLERGIES.   SOCIAL HISTORY:  She denies any tobacco abuse.  She does drink quite a  bit of caffeine.  She says that she does not eat much salt but does  admit to eating out at fast food restaurants from time to time.   FAMILY HISTORY:  Noncontributory for early coronary artery disease.   REVIEW OF SYSTEMS:  Please see HPI.  She apparently has a history of  hemorrhoids.  She says that she had colonoscopy done a couple of months  ago.  I have a listing in the chart that she had a colonoscopy done two  years ago with a polyp removal.  She cannot really remember if her  colonoscopy was two months or two years ago.  She does note some  hemorrhoidal bleeding at times.  This is blood just on the tissue.  She  denies any black, tarry stools.  She denies headache or blurry vision.  She denies any facial numbness, facial drooping, monocular blindness,  unilateral weakness,  or difficulty with speech.  The rest of the review  of systems were negative.   PHYSICAL EXAMINATION:  GENERAL:  She is a well-developed, well-nourished  female in no acute distress.  VITAL SIGNS:  Blood pressure 189/105.  Repeat blood pressure by me is  220/110 in the left, 208/108 on the right.  HEAD:  Normocephalic, atraumatic.  EYES:  PERRLA, EOMI.  Sclerae clear.  NECK:  Without JVD.  Without lymphadenopathy.  ENDOCRINE:  With positive thyromegaly noted.  CARDIAC:  Normal S1, S2.  Regular rate and rhythm without murmurs.  LUNGS:  Clear to auscultation without wheezing, rhonchi, or rales.  ABDOMEN:  Soft and nontender with normal bowel sounds.  No  hepatosplenomegaly.  EXTREMITIES:  Without edema.  Calves are soft and nontender.  SKIN:  Warm and dry.  NEUROLOGIC:  She is alert and oriented x3.  Cranial nerves II through  XII grossly intact.   Electrocardiogram reveals sinus bradycardia with a heart rate of 55,  normal axis, nonspecific ST/T wave changes.  She does have suggestion of  T-wave inversion in leads 5 and 6 but when compared to previous tracings  it appears that those changes were there previously, although slightly  more prominent on today's EKG.   IMPRESSION:  1. Malignant hypertension.  2. Exertional shortness of breath and nausea.  3. Coronary artery disease.      a.     Status post stenting to the circumflex 1998.      b.     Stable anatomy at recent cardiac catheterization November       2007 with chronic 90% stenosis in the proximal RCA with left-to-       right collaterals.  4. Renal vascular disease, status post bilateral renal artery stenting      November 2007 by Dr. Samule Ohm.  5. Preserved LV function with an EF of 65%.  6. Chronic diastolic congestive heart failure.  7. Hyperlipidemia.  8. Goiter.      a.     I do note that she had a TSH of 11.941 when she was      hospitalized in November 2007.  Free T4 was 6.7, T3 was 154.2.  9. Ex-smoker.   10.Obesity.  11.History of CVA versus TIA  in 2005.   PLAN:  I spent approximately an hour and a half to two hours in the  office with the patient today trying to get her blood pressure down.  We  gave her Clonidine 0.1 mg x2.  Eventually by the time she left the  office her blood pressure was 172/100.  I question her compliance with  her medications, really, especially given her history of missing several  appointments.  I have gone over with her today extensively how to take  her medicines.  At this point in time I have discontinued her  hydrochlorothiazide and her metoprolol.  I have prescribed to her  Carvedilol 3.125 mg twice a day as well as Lisinopril/HCT 20 mg/25 mg a  day.  I also want her to continue on aspirin 325 mg daily.  She is  unsure whether or not she is taking Pravastatin.  I prescribed for her  Pravastatin 20 mg nightly.  In looking through the notes, it looked as  though she was going to stay on Plavix.  I am not exactly sure why she  is off of this.  The discharge notes indicated she was to take Plavix  for 30 days but when she saw Dr. Antoine Poche in mid-December it appears as  though he wanted her to stay on the Plavix.  She has a hard time  affording her medications.  We will not restart that medication at this  time.  I have written out for her a list of the medications she should  take on a daily basis.  She did note to me today that she thought that  that was a lot of medications.  I explained to her that it is going to  take probably 4 medications to treat her blood pressure to get it down  to where it should be, and I have explained to her the dangers of not  treating this.   She does describe to me some exertional nausea and shortness of breath.  She says that she just got back to exercising which she has not done for  quite some time.  I wonder if some of this may be secondary to her  hypertension that is uncontrolled.  She does have a history of coronary   disease.  To start with, I have recommended we set her up for an  adenosine Myoview to rule out significant ischemia.  I expect that she  is probably going to have some inferior ischemia given her history of  90% proximal stenosis with left-to-right collaterals.  We will make sure  that she does not have any other significant ischemia or high-risk scan.   As noted above, her hypertension is uncontrolled.  It has been over six  months since her bilateral renal artery intervention.  I think at this  point in time we will proceed with renal artery ultrasound to ensure  that her renal arteries remain patent.   In addition to the above workup, I have recommended we get a CMET, CBC,  and TSH level today.  She appears to have a goiter on exam.  I have  asked her to follow up with her primary care physician further for this  and to notify them that we did drew a TSH today.  We will get a BMET in a week to follow up on her renal function and potassium given the change  in her blood pressure medication.  Will also get follow-up lipids and  LFTs in  about six weeks.   I discussed with Dr. Excell Seltzer the possibility of him following this  patient in the future and he agreed to accept her.  I will bring her  back in follow up with Dr. Excell Seltzer in the next 2-3 weeks for  further recommendations after the above testing is completed.  The  patient will return to the office in one week for blood pressure check  with the nurse.      Tereso Newcomer, PA-C  Electronically Signed      Veverly Fells. Excell Seltzer, MD  Electronically Signed   SW/MedQ  DD: 05/28/2006  DT: 05/28/2006  Job #: [id number]

## 2010-06-22 NOTE — Discharge Summary (Signed)
[facility]  Patient:    [name], [name]                        MRN: [id number] Adm. Date:  [id number] Disc. Date: [id number] Attending:  Wende Mott                           Discharge Summary  ADMITTING DIAGNOSES: 1. Bimalleolar fracture, left ankle. 2. History of heart disease, high blood pressure.  DISCHARGE DIAGNOSES: 1. Bimalleolar fracture, left ankle. 2. History of heart disease, high blood pressure.  COMPLICATIONS:  None.  INFECTIONS:  None.  OPERATION:  Left ankle open reduction and internal fixation.  HISTORY OF PRESENT ILLNESS:  This is a 65 year old who had twisted her ankle on the day of admission and has sustained an injury to her left ankle, came to [facility] emergency room for treatment, and was found to have bimalleolar fracture of the left ankle.  PHYSICAL EXAMINATION:  Pertinent physical exam of the left ankle reveals it to be tender, swollen, and slightly deformed.  Pulses intact.  LABORATORY DATA:  X-rays revealed bimalleolar fracture of the left ankle.  HOSPITAL COURSE:  Patient underwent ORIF of the left ankle and application of short leg cast, started on physical therapy postoperative and was able to be nonweightbearing on the left side.  Pain was brought under control.  Patient was able to be discharged on Percocet one p.o. q.4h. p.r.n. for pain, ice packs, elevation, nonweightbearing with the use of crutches, left leg.  She is to see Dr. Daphine Deutscher, her medical doctor, in two weeks, and also continue her Norvasc 10 mg daily and K-Dur 20 mEq daily.  Patient was able to be discharged in stable and satisfactory condition and to return to the office in a two-week period. DD:  08/02/99 TD:  08/03/99 Job: [id number]

## 2010-06-22 NOTE — Cardiovascular Report (Signed)
NAMEALLYN, BARTELSON                 ACCOUNT NO.:  [id number]   MEDICAL RECORD NO.:  [id number]          PATIENT TYPE:  OIB   LOCATION:  2899                         FACILITY:  [facility]   PHYSICIAN:  Nanetta Batty, M.D.   DATE OF BIRTH:  27-May-1945   DATE OF PROCEDURE:  DATE OF DISCHARGE:                              CARDIAC CATHETERIZATION   ABDOMINAL AORTOGRAM AND SELECTIVE BILATERAL RENAL ANGIOGRAMS   [name] is a 64 year old mildly overweight African-American female with  history of CAD and PCOD.  Other problems include uncontrolled hypertension,  hyperlipidemia.  Renal Dopplers did not suggest renal artery stenosis which  was physiologically systemic, however, the patient does have resistant  hypertension and moderate bilateral renal artery stenosis by angiogram  during her cath in December 2005.  She presents now for selective  angiography and pressure measurements to determine whether or not she is a  PTA and stent candidate in order to better control her blood pressure and  allow treatment with an ACE or an ARB.   PROCEDURE DESCRIPTION:  The patient was brought to the sixth floor [facility] Angiographic suite in the postabsorptive state.  She was premedicated with p.o. Valium, and IV Nubain.  Her right groin was  prepped and shaved in the usual sterile fashion.  One percent Xylocaine was  used for local anesthesia.  A #6 French sheath was inserted into the right  femoral artery using standard Seldinger technique.  A #6 [place] Tennis  Racquet catheter was used for midstream and distal abdominal aortography.  A  #6 [place] IMA guide catheter was used for selective right renal artery  angiography with simultaneous pressure measurements using an #014 stabilizer  wire and a #4 French 90 cm long inflow catheter.  The patient did receive  2500 units of heparin intravenously prior to crossing the origin of the  renal artery.  A double renal curve #6 French short  guide catheter was used  to perform the same procedure on the left renal artery.  Visipaque dye was  used for the entirety of the case.  Retrograde aortic pressure was monitored  in the case.   ANGIOGRAPHIC RESULTS:  1.  Abdominal aortogram.      1.  Renal arteries--60% bilateral renal artery stenosis.  There was a 15          mm simultaneous gradient measured across both renal arteries.      2.  Infrarenal abdominal aorta; moderate atherosclerotic changes.  2.  Right lower extremity;      1.  50-70% long segmental proximal right common iliac artery stenosis.  3.  Left lower extremity;      1.  40% segmental proximal left common iliac artery stenosis.   IMPRESSION:  [name] has moderate, though not severe bilateral renal artery  stenosis.  I do not think that these are physiologically significant given  the low gradient.  I suspect that it will be okay to treat her with an ACE  or an ARB for blood pressure control.  The angiographic findings correlate  with the Doppler  findings and thus we will follow her with serial duplex  ultrasound of her renal arteries.   __________  measured at 160.  The patient received 20 mg of Apresoline  intravenously for blood pressure control prior to sheath removal.  Pressure  was held in the groin to achieve hemostasis.  The patient left the lab in  stable  condition.  She will be discharged home on and we will see her as an  outpatient.  She will started on Diovan 160 mg daily and her renal function  will be assessed in approximately one week after which I will see her back  in the office in followup.  She left the lab in stable condition.      JB/MEDQ  D:  03/26/2004  T:  03/26/2004  Job:  [id number]   cc:   Sixth Floor Emergency Angiographic Suite   [facility]  [address] [place] [place], [place] [address]   Nicki Guadalajara, M.D.  [address]  [place], [place] [address]  Fax: [phone]

## 2010-06-22 NOTE — Group Therapy Note (Signed)
NAME:  [name], SIMCOE NO.:  [id number]   MEDICAL RECORD NO.:  [id number]          PATIENT TYPE:  WOC   LOCATION:  [facility]                   FACILITY:  [facility]   PHYSICIAN:  Dorthula Perfect, MD     DATE OF BIRTH:  1945-12-16   DATE OF SERVICE:  06/27/2005                                    CLINIC NOTE   HISTORY OF PRESENT ILLNESS:  This 65 year old black female, gravida 3, para  3, whose last menstrual period was approximately two years ago was referred  from the [facility] for evaluation of a possible  lesion on her cervix.  She was seen by the internal medicine folks Jun 05, 2005 complaining of lower abdominal and suprapubic pain.  This has  completely gone away.  While there, a pelvic exam revealed some small  nodules on the cervix.  She was thought to have some uterine prolapse.  She  was treated with metronidazole for bacterial vaginosis.  She had a Pap smear  done, and the report I have seen shows it to be negative.  There was no  evidence of atypia.  She also had a GC and Chlamydia culture, and I have  been told that the report in the computer is negative.   PAST MEDICAL HISTORY:  1.  Approximately five years ago or so she had one stent placed in a      coronary artery.  2.  She was thought to have had a mini stroke last year sometime and was      hospitalized for a week.  Sometime after that, she was started on daily      Plavix which she continues to take.   REVIEW OF SYSTEMS:  She has minimal genitourinary symptoms.  She does have  occasional leakage of urine when she has a full bladder, but this is pretty  much controlled if she remembers to go to the bathroom on a much more  regular basis.  She has no GI complaints.   PHYSICAL EXAMINATION:  ABDOMEN:  Somewhat obese, soft, and nontender.  No  masses are palpable.  PELVIC:  External genitalia and BUS glands were normal.  Vaginal vault was  epithelialized as was the cervix.   She did not have any significant  descensus, and as a matter of fact, during the exam, the cervix did not  descend.  The cervix was slightly retroflexed.  Exposing the cervix with the  speculum reveals a 9 to 10-mm cyst at approximately the 7 o'clock position.  She also has a couple of very small endocervical hypertrophic areas, each of  which was no more than a millimeter in size.  Because her Pap smear is  completely negative, I think these are benign findings on the cervix.   DIAGNOSIS:  Cervical cyst.   DISPOSITION:  The patient is reassured.  If the cervical cyst would get  larger (which I doubt) and then it needs to be removed with, say  electrocautery, the patient would have to have her Plavix discontinued for a  week.  I have  explained  the reasons why to the patient.  I believe the potential problem  by coming off Plavix for a week would be far greater than removing this  small benign cyst.           ______________________________  Dorthula Perfect, MD     ER/MEDQ  D:  06/27/2005  T:  06/28/2005  Job:  [id number]   cc:   [facility]

## 2010-06-22 NOTE — H&P (Signed)
NAMEJAYCI, [name]                 ACCOUNT NO.:  [id number]   MEDICAL RECORD NO.:  [id number]          PATIENT TYPE:  INP   LOCATION:  3742                         FACILITY:  [facility]   PHYSICIAN:  Nicolasa Ducking, ANP DATE OF BIRTH:  09/08/1945   DATE OF ADMISSION:  12/20/2005  DATE OF DISCHARGE:                                HISTORY & PHYSICAL   PRIMARY CARE PHYSICIAN:  Dr. Peggye Pitt.   PRIMARY CARDIOLOGIST:  Dr. Rollene Rotunda.   PATIENT PROFILE:  A 65 year old African American female with a prior history  of CAD, who presents with a 7-day history of exertional dyspnea, nausea, and  mid scapular discomfort.   PROBLEMS:  1. Coronary artery disease.      a.     Status post percutaneous transluminal angioplasty on November 01, 1996, with placement of a 3.0 x 25-mm NIR bare metal stent.      b.     On January 26, 2004, cardiac catheterization.  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  Ejection fraction was 60% at that time.  The patient was       medically managed.  2. Hypertension since 1989.  3. Hyperlipidemia.  4. History of bilateral renal artery stenosis and peripheral vascular      disease.      a.     In February of 2006, peripheral angiography revealing 60%       bilateral renal artery stenosis.  There was 60% to 70% stenosis of the       proximal right common iliac artery.  5. Obesity.  6. Remote tobacco abuse, previous 30-pack-year history, quitting in 1994.  7. History of cardiovascular accident versus transient ischemic attack      (the patient says mini stroke) in 2005.   HISTORY OF PRESENT ILLNESS:  A 65 year old [place] American female with a  history of CAD status post PTA of left circumflex on November 01, 1996.  She last saw Dr.  Antoine Poche on December 03, 2005, and was doing well at that  time and continued to do well until approximately 7 days ago, when she began  to notice dyspnea on exertion associated with nausea, as well as daily 5 out  of 10 mid scapular back pain associated with nausea, dyspnea, and 1 episode  with diaphoresis and vomiting.  The symptoms have generally been occurring  after meals, but also occurred 1 day while walking across her living room,  generally lasted about 5 minutes, and resolved with rest.  Her symptoms are  exactly similar to her previous anginal equivalent, which includes shortness  of breath and nausea.  Today, she was at home doing homework (she is  studying for an early childhood education certificate) and developed 7 out  of 10  mid scapular pain with nausea and shortness of breath, similar to her  angina in 1998.  She took sublingual nitroglycerin with immediate relief.  She apparently fell off to sleep, but when she woke up, she called our  office around 5:00 and was advised to come in to the ED.  She is currently  pain free; however, ECG shows new Q-wave inversion in leads 2, 3, AFV, V5,  and V6.   ALLERGIES:  NO KNOWN DRUG ALLERGIES.   CURRENT HOME MEDICATIONS:  1. Norvasc 10 mg daily.  2. Hydrochlorothiazide 25 mg daily.  3. Aspirin 325 mg daily.  4. Lopressor 25 mg b.i.d.  5. Plavix 75 mg daily.  6. Pravachol 20 mg daily.   FAMILY HISTORY:  Noncontributory for early CAD.   SOCIAL HISTORY:  She lives in [place] with her grandson.  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  She had 3 children.  One died at age 60 from a  homicide.  She has a 30-pack-year history of tobacco abuse, quitting in  1994, previously smoking approximately 2 packs a day for 15 years.  She  rarely drinks alcohol on the holidays.  She denies any drug use.  She does  not routinely exercise.   REVIEW OF SYSTEMS:  Positive for  mid scapular discomfort, as well as dyspnea  on exertion, nausea, vomiting on 1 episode, as well as 1 episode of  diaphoresis in association with the other symptoms.  Otherwise, all systems  reviewed and negative.   PHYSICAL EXAMINATION:  VITAL SIGNS:  Temperature 98.0, heart rate 60,  respirations 18, blood pressure 188/102, pulse oximeter 99% on room air.  GENERAL:  Pleasant African American female in no acute distress.  Awake,  alert, and oriented x3.  NECK:  Obese, no bruits.  LUNGS:  Respirations regular and unlabored.  Clear to auscultation.  CARDIAC:  Regular, S1, S2.  No S3, S4, or murmurs.  ABDOMEN:  Obese, soft, nontender, and nondistended.  Bowel sounds present  x4.  EXTREMITIES:  Warm and dry without clubbing, cyanosis, or edema.  Dorsalis  pedis and posterior tibial pulses 1+ and equal bilaterally.  No femoral  bruits are appreciated.  HEENT:  Atraumatic, normocephalic.  NEURO:  Grossly intact, nonfocal.  SKIN:  Warm and dry without lesions or masses.   LABORATORY DATA:  Sodium 141, potassium 3.0, chloride 102, CO2 33.9, BUN 13,  creatinine 1.0, glucose 99.  Other lab work is pending.  EKG shows sinus  bradycardia rate of 56 beats per minute.  She has new Q-wave inversion in  leads 2, 3, AVF, V5, and V6.   ASSESSMENT/PLAN:  1. Unstable angina/coronary artery disease.  The patient presents with a 7-      day history of rest and exertional symptoms including mid scapular back      discomfort, nausea, and dyspnea, as well as 1 episode of vomiting and      diaphoresis, all similar to her previous anginal equivalent.  We plan      to admit her and cycle cardiac markers.  We will continue her aspirin,      beta blocker, statin, and Plavix.  We will add IV heparin and      nitroglycerin paste, as she has had some symptoms at rest at home.  I      will plan on left heart cardiac catheterization on Monday. 2. Hypertension.  Blood pressure is markedly  elevated.  We will  continue      her home medications and add hydralazine 25 mg t.i.d.  No ACE      inhibitors secondary to documented bilateral renal artery stenosis.  We      will discuss with Dr. Samule Ohm or Dr. Excell Seltzer, whoever performs her      catheterization, the possibility of renal angiography during her heart      catheterization to reevaluate renal artery stenosis, which was last      evaluated in February of 2006.  3. Hyperlipidemia.  Continue Pravachol.  Check lipids and liver function      tests.  4. Obesity.  We will obtain a nutrition consult.  5. Hypokalemia.  The patient is on hydrochlorothiazide at home.  We will      replace her potassium tonight and also add 40 mEq daily.      Nicolasa Ducking, ANP     CB/MEDQ  D:  12/20/2005  T:  12/21/2005  Job:  [id number]

## 2010-06-22 NOTE — Cardiovascular Report (Signed)
[name], [name]                 ACCOUNT NO.:  [id number]   MEDICAL RECORD NO.:  [id number]          PATIENT TYPE:  INP   LOCATION:                               FACILITY:  [facility]   PHYSICIAN:  Nanetta Batty, M.D.   DATE OF BIRTH:  12-Oct-1945   DATE OF PROCEDURE:  DATE OF DISCHARGE:                              CARDIAC CATHETERIZATION   [name] is a 65 year old, moderately overweight, African American female  who underwent PCI stenting of her circ obtuse marginal using a NIR 3.0 x 25  stent, November 01, 1996.  She had noncritical disease in her other  vessels.  Her other problems include hypertension, hyperlipidemia, and  diabetes.  She has had recurrent chest pain similar to her pre PCI symptoms  and was placed on IV heparin and nitro.  Her point-of-care markers were  negative.  She presents now for diagnostic coronary angiography.   PROCEDURE DESCRIPTION:  The patient was brought to the second floor [facility] in the post absorptive state.  She was premedicated  with p.o. Valium, IV Versed, and morphine.  Her right groin was prepped and  shaved in the usual sterile fashion.  Xylocaine 1% was used for local  anesthesia.  A 6-French sheath was inserted into the right femoral artery  using standard Seldinger technique.  A 6-French right and left Judkins  diagnostic catheters as well as a 6-French pigtail catheter were used for  selective coronary angiography, left ventriculography, distal abdominal  aortography, selective right and left renal artery angiography.  Visipaque  dye was used for the entirety blood pressure well recorded.   HEMODYNAMICS:  1.  Aortic systolic pressure 227, diastolic pressure 125.  2.  Left ventricle systolic pressure 227 and diastolic pressure 23.   SELECTIVE CORONARY ANGIOGRAPHY:  1.  Left main:  There is a 30% tapered distal stenosis.  2.  LAD:  The LAD gave off several small diagonal branches.  There is a 60%      fairly focal  stenosis just before the second small diagonal branch and a      50% just after the third small diagonal branch.  3.  Left circumflex:  The previously placed stent was widely patent with at      most 30 or 40% in-stent restenosis.  4.  Right coronary artery:  This appeared to be a diffusely diseased vessel      with a long 95% stenosis in the proximal mid portion as well as in the      distal portion at the crux and in the PD and PLA.  It did give off an      anomalous circumflex with three small posterolateral branches that had      80% proximal stenosis.   LEFT VENTRICULOGRAPHY:  ROA left ventriculogram was performed using 25 cc of  Visipaque dye at 12 cc/sec.  The overall LVEF was estimated at greater than  60% without wall motion abnormalities.   DISTAL ABDOMINAL AORTOGRAPHY:  Distal abdominal aortogram was performed  using 25 cc of Visipaque dye at  20 cc/sec.  There appeared to be bilateral  renal artery stenosis.  There was moderate infrarenal abdominal aortic  atherosclerotic changes; however, there was no aneurysmal dilatation or  significant stenosis.   SELECTIVE RIGHT AND LEFT RENAL ARTERY ANGIOGRAPHY:  This was performed using  a long right Judkins catheter.  There is approximately 70-80% bilateral  renal artery stenosis with some post-stenotic dilatation in the right renal  artery.   IMPRESSION:  1.  [name] has widely patent marginal stent with high grade disease and an      anomalous circumflex and what appears to be a co-dominant right.  2.  She also has systemic hypertension most likely multifactorial but renal      vascular etiology can not be ruled out given her bilateral renal artery      stenosis.   She will most likely require bilateral renal artery PT and stenting.  Her  medicines will be adjusted and her films will be reviewed with my  colleagues.  There is the possibility that she may require complex RCA  intervention.  The ACT was measured and the sheaths  were removed.  Pressure  on the groin achieved hemostasis.  The patient left the lab in a stable  condition.      JB/MEDQ  D:  01/27/2004  T:  01/28/2004  Job:  [id number]   cc:   Second Floor, Cardiac Cath Lab [facility]

## 2010-07-06 ENCOUNTER — Ambulatory Visit (INDEPENDENT_AMBULATORY_CARE_PROVIDER_SITE_OTHER): Payer: Self-pay | Admitting: Cardiovascular Disease

## 2010-07-06 ENCOUNTER — Encounter: Payer: Self-pay | Admitting: Cardiovascular Disease

## 2010-07-06 DIAGNOSIS — E785 Hyperlipidemia, unspecified: Secondary | ICD-10-CM

## 2010-07-06 DIAGNOSIS — E7800 Pure hypercholesterolemia, unspecified: Secondary | ICD-10-CM

## 2010-07-06 DIAGNOSIS — I2510 Atherosclerotic heart disease of native coronary artery without angina pectoris: Secondary | ICD-10-CM

## 2010-07-06 DIAGNOSIS — I701 Atherosclerosis of renal artery: Secondary | ICD-10-CM

## 2010-07-06 NOTE — Patient Instructions (Signed)
Your physician wants you to follow-up in: 6 months with  Dr Theodoro Parma will receive a reminder letter in the mail two months in advance. If you don't receive a letter, please call our office to schedule the follow-up appointment.  Your physician recommends that you return for lab work --fasting BMP/CBC/LIVER/LIPID/HGBA1C   414.01  401.1

## 2010-07-06 NOTE — Progress Notes (Signed)
HPI:  This is a 65 year old woman presented for followup evaluation of coronary artery disease and renal artery stenosis. She has undergone stenting of the left circumflex is noted to have chronic total occlusion of the small right coronary artery. The patient underwent cardiac catheterization in December 2004 recurrent angina with class III symptoms at that time. Her cardiac catheterization demonstrated patency of the left mainstem, total occlusion of the right coronary artery with left to right collaterals, diffuse nonobstructive disease of the LAD, and patency of the stent in the left circumflex with no obstructive disease in that region. Her left ventricular function is normal with an ejection fraction estimated at 65-70% by catheterization. Medical therapy was recommended.  The patient is doing well at present. She has occasional fleeting chest pains but no recurrent exertional chest tightness or discomfort. She also has mild dyspnea with exertion, unchanged over time. The patient denies edema, orthopnea, PND, or palpitations. She reports compliance with her medical program.  Outpatient Encounter Prescriptions as of 07/06/2010  Medication Sig Dispense Refill  . amLODipine (NORVASC) 10 MG tablet Take 10 mg by mouth daily.        Marland Kitchen aspirin 325 MG tablet Take 325 mg by mouth daily.        . carvedilol (COREG) 12.5 MG tablet Take 12.5 mg by mouth 2 (two) times daily with a meal.        . lisinopril-hydrochlorothiazide (PRINZIDE,ZESTORETIC) 20-12.5 MG per tablet Take 1 tablet by mouth daily.        . nitroGLYCERIN (NITROSTAT) 0.4 MG SL tablet Place 0.4 mg under the tongue every 5 (five) minutes as needed.        . simvastatin (ZOCOR) 40 MG tablet Take 40 mg by mouth at bedtime.        Marland Kitchen levothyroxine (SYNTHROID, LEVOTHROID) 75 MCG tablet Take 75 mcg by mouth daily.        . potassium chloride (KLOR-CON) 10 MEQ CR tablet Take 10 mEq by mouth daily.          No Known Allergies  Past Medical History    Diagnosis Date  . Hypertension   . Cervical polyp   . Vaginal discharge   . Neck mass   . Cervical lymphadenopathy   . Urinary tract infection   . Renal artery stenosis   . Cerebrovascular accident   . Hypothyroidism   . Hypertension   . Hyperlipidemia   . Coronary artery disease     ROS: Negative except as per HPI  BP 159/84  Pulse 84  Ht 5\' 4"  (1.626 m)  Wt 212 lb (96.163 kg)  BMI 36.39 kg/m2  PHYSICAL EXAM: Pt is alert and oriented, overweight woman in NAD HEENT: normal Neck: JVP - normal, carotids 2+= without bruits Lungs: CTA bilaterally CV: RRR without murmur or gallop Abd: soft, NT, Positive BS, no hepatomegaly Ext: no C/C/E, distal pulses intact and equal Skin: warm/dry no rash  ASSESSMENT AND PLAN:

## 2010-07-09 NOTE — Assessment & Plan Note (Addendum)
The patient is stable without angina. I have recommended continuation of her current medical program without changes.I reviewed the patient's lab work and she has had no recent laboratory studies performed. She complains of generalized fatigue and also is concerned about diabetes as she has been noted to have elevated blood glucose in the past and has a very strong family history of diabetes. Will check a CBC, metabolic panel, lipid panel, and hemoglobin A1c.

## 2010-07-09 NOTE — Assessment & Plan Note (Signed)
Her latest renal duplex was reviewed from April 2011. This demonstrated wide patency of the right renal artery stent. She has normal bilateral kidney size. Will continue with clinical followup and she is now several years out from stenting and has patency of her stent. I do not think she requires further duplex evaluations unless there is a change in her clinical status.

## 2010-07-09 NOTE — Assessment & Plan Note (Signed)
Last LDL was 77. The patient is on simvastatin 40 mg. We'll follow up lipids.

## 2010-07-11 ENCOUNTER — Other Ambulatory Visit (INDEPENDENT_AMBULATORY_CARE_PROVIDER_SITE_OTHER): Payer: Self-pay | Admitting: *Deleted

## 2010-07-11 DIAGNOSIS — I2510 Atherosclerotic heart disease of native coronary artery without angina pectoris: Secondary | ICD-10-CM

## 2010-07-11 DIAGNOSIS — I10 Essential (primary) hypertension: Secondary | ICD-10-CM

## 2010-07-11 LAB — BASIC METABOLIC PANEL
BUN: 12 mg/dL (ref 6–23)
CO2: 33 mEq/L — ABNORMAL HIGH (ref 19–32)
Calcium: 8.9 mg/dL (ref 8.4–10.5)
Chloride: 101 mEq/L (ref 96–112)
Creatinine, Ser: 1 mg/dL (ref 0.4–1.2)
GFR: 72.92 mL/min (ref >60.00)
Glucose, Bld: 116 mg/dL — ABNORMAL HIGH (ref 70–99)
Potassium: 3.3 mEq/L — ABNORMAL LOW (ref 3.5–5.1)
Sodium: 140 mEq/L (ref 135–145)

## 2010-07-11 LAB — HEPATIC FUNCTION PANEL
ALT: 20 U/L (ref 0–35)
AST: 18 U/L (ref 0–37)
Albumin: 4 g/dL (ref 3.5–5.2)
Alkaline Phosphatase: 59 U/L (ref 39–117)
Bilirubin, Direct: 0.1 mg/dL (ref 0.0–0.3)
Total Bilirubin: 0.4 mg/dL (ref 0.3–1.2)
Total Protein: 7.4 g/dL (ref 6.0–8.3)

## 2010-07-11 LAB — LIPID PANEL
Cholesterol: 250 mg/dL — ABNORMAL HIGH (ref 0–200)
HDL: 39.4 mg/dL (ref >39.00)
Total CHOL/HDL Ratio: 6
Triglycerides: 181 mg/dL — ABNORMAL HIGH (ref 0.0–149.0)
VLDL: 36.2 mg/dL (ref 0.0–40.0)

## 2010-07-11 LAB — CBC WITH DIFFERENTIAL/PLATELET
Basophils Absolute: 0 10*3/uL (ref 0.0–0.1)
Basophils Relative: 0.7 % (ref 0.0–3.0)
Eosinophils Absolute: 0.2 10*3/uL (ref 0.0–0.7)
Eosinophils Relative: 4 % (ref 0.0–5.0)
HCT: 35.9 % — ABNORMAL LOW (ref 36.0–46.0)
Hemoglobin: 12 g/dL (ref 12.0–15.0)
Lymphocytes Relative: 48.8 % — ABNORMAL HIGH (ref 12.0–46.0)
Lymphs Abs: 2.7 10*3/uL (ref 0.7–4.0)
MCHC: 33.4 g/dL (ref 30.0–36.0)
MCV: 86.4 fl (ref 78.0–100.0)
Monocytes Absolute: 0.3 10*3/uL (ref 0.1–1.0)
Monocytes Relative: 6.3 % (ref 3.0–12.0)
Neutro Abs: 2.2 10*3/uL (ref 1.4–7.7)
Neutrophils Relative %: 40.2 % — ABNORMAL LOW (ref 43.0–77.0)
Platelets: 263 10*3/uL (ref 150.0–400.0)
RBC: 4.15 Mil/uL (ref 3.87–5.11)
RDW: 14.1 % (ref 11.5–14.6)
WBC: 5.5 10*3/uL (ref 4.5–10.5)

## 2010-07-11 LAB — LDL CHOLESTEROL, DIRECT: Direct LDL: 196.8 mg/dL

## 2010-07-11 LAB — HEMOGLOBIN A1C: Hgb A1c MFr Bld: 7.4 % — ABNORMAL HIGH (ref 4.6–6.5)

## 2010-07-23 ENCOUNTER — Telehealth: Payer: Self-pay | Admitting: Cardiovascular Disease

## 2010-07-23 NOTE — Telephone Encounter (Signed)
Pt aware of results by phone.  The pt said she ran out of simvastatin about a month ago.  I instructed the pt to get this medication filled and restart for cholesterol management.  I reviewed the foods that the pt should avoid (carbohydrates) to improve her glucose levels. The pt is followed by the outpatient clinic at [facility] and will call and arrange an appointment to follow-up on her labs.  The pt also has a potassium supplement listed on her medication list but she does not take this medication and does not want to restart.  The pt will increase her potassium rich foods in the diet.  A copy of labs will be mailed to the pt to follow-up with PCP.

## 2010-07-23 NOTE — Telephone Encounter (Signed)
Test results

## 2010-10-25 ENCOUNTER — Telehealth: Payer: Self-pay | Admitting: Cardiovascular Disease

## 2010-10-25 ENCOUNTER — Other Ambulatory Visit: Payer: Self-pay | Admitting: Internal Medicine

## 2010-10-25 LAB — INFLUENZA A AND B ANTIGEN (CONVERTED LAB)
Inflenza A Ag: NEGATIVE
Influenza B Ag: NEGATIVE

## 2010-10-25 NOTE — Telephone Encounter (Signed)
hctz 20-12.5, amlodipine 10mg , carvedilol 12.5, simvastatin 40mg , [facility]

## 2010-10-26 ENCOUNTER — Telehealth: Payer: Self-pay | Admitting: Cardiovascular Disease

## 2010-10-26 MED ORDER — LISINOPRIL-HYDROCHLOROTHIAZIDE 20-12.5 MG PO TABS: 1.0000 | ORAL_TABLET | Freq: Every day | ORAL | Status: DC

## 2010-10-26 MED ORDER — SIMVASTATIN 40 MG PO TABS: 40.0000 mg | ORAL_TABLET | Freq: Every day | ORAL | Status: DC

## 2010-10-26 MED ORDER — AMLODIPINE BESYLATE 10 MG PO TABS: 10.0000 mg | ORAL_TABLET | Freq: Every day | ORAL | Status: DC

## 2010-10-26 MED ORDER — CARVEDILOL 12.5 MG PO TABS: 12.5000 mg | ORAL_TABLET | Freq: Two times a day (BID) | ORAL | Status: DC

## 2010-10-26 NOTE — Telephone Encounter (Signed)
Pharmacy calling wanting to clarify the dosage of pt lisinopril. Please return call to discuss/clarify.

## 2010-10-30 MED ORDER — LISINOPRIL-HYDROCHLOROTHIAZIDE 20-12.5 MG PO TABS: 1.0000 | ORAL_TABLET | Freq: Every day | ORAL | Status: DC

## 2010-12-16 ENCOUNTER — Emergency Department (HOSPITAL_COMMUNITY)
Admission: EM | Admit: 2010-12-16 | Discharge: 2010-12-16 | Disposition: A | Discharge status: Home / Self Care | Payer: No Typology Code available for payment source | Attending: Emergency Medicine | Admitting: Emergency Medicine

## 2010-12-16 ENCOUNTER — Emergency Department (HOSPITAL_COMMUNITY): Payer: No Typology Code available for payment source

## 2010-12-16 ENCOUNTER — Encounter (HOSPITAL_COMMUNITY): Payer: Self-pay | Admitting: Emergency Medicine

## 2010-12-16 ENCOUNTER — Emergency Department (HOSPITAL_COMMUNITY): Payer: Self-pay

## 2010-12-16 DIAGNOSIS — M542 Cervicalgia: Secondary | ICD-10-CM | POA: Insufficient documentation

## 2010-12-16 DIAGNOSIS — Y92410 Unspecified street and highway as the place of occurrence of the external cause: Secondary | ICD-10-CM | POA: Insufficient documentation

## 2010-12-16 DIAGNOSIS — I2510 Atherosclerotic heart disease of native coronary artery without angina pectoris: Secondary | ICD-10-CM | POA: Insufficient documentation

## 2010-12-16 DIAGNOSIS — R51 Headache: Secondary | ICD-10-CM | POA: Insufficient documentation

## 2010-12-16 DIAGNOSIS — S161XXA Strain of muscle, fascia and tendon at neck level, initial encounter: Secondary | ICD-10-CM

## 2010-12-16 DIAGNOSIS — I10 Essential (primary) hypertension: Secondary | ICD-10-CM | POA: Insufficient documentation

## 2010-12-16 DIAGNOSIS — S0100XA Unspecified open wound of scalp, initial encounter: Secondary | ICD-10-CM | POA: Insufficient documentation

## 2010-12-16 DIAGNOSIS — E039 Hypothyroidism, unspecified: Secondary | ICD-10-CM | POA: Insufficient documentation

## 2010-12-16 DIAGNOSIS — S139XXA Sprain of joints and ligaments of unspecified parts of neck, initial encounter: Secondary | ICD-10-CM | POA: Insufficient documentation

## 2010-12-16 DIAGNOSIS — S7000XA Contusion of unspecified hip, initial encounter: Secondary | ICD-10-CM | POA: Insufficient documentation

## 2010-12-16 DIAGNOSIS — S7002XA Contusion of left hip, initial encounter: Secondary | ICD-10-CM

## 2010-12-16 DIAGNOSIS — R079 Chest pain, unspecified: Secondary | ICD-10-CM | POA: Insufficient documentation

## 2010-12-16 MED ORDER — OXYCODONE-ACETAMINOPHEN 5-325 MG PO TABS
1.0000 | ORAL_TABLET | ORAL | Status: DC | PRN
Start: 2010-12-16 — End: 2010-12-25

## 2010-12-16 MED ORDER — DIAZEPAM 5 MG PO TABS: 5.0000 mg | ORAL_TABLET | Freq: Four times a day (QID) | ORAL | Status: DC | PRN

## 2010-12-16 MED ORDER — ONDANSETRON HCL 4 MG/2ML IJ SOLN
4.0000 mg | Freq: Once | INTRAMUSCULAR | Status: AC
  Administered 2010-12-16: 4 mg via INTRAVENOUS
  Filled 2010-12-16: qty 2

## 2010-12-16 MED ORDER — FENTANYL CITRATE 0.05 MG/ML IJ SOLN
50.0000 ug | Freq: Once | INTRAMUSCULAR | Status: AC
  Administered 2010-12-16: 50 ug via INTRAVENOUS
  Filled 2010-12-16: qty 2

## 2010-12-16 MED ORDER — SODIUM CHLORIDE 0.9 % IV SOLN
INTRAVENOUS | Status: DC
  Administered 2010-12-16: 15:00:00 via INTRAVENOUS
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
## 2010-12-16 NOTE — ED Provider Notes (Signed)
7 PM patient alert ambulatory Glasgow Coma Score 15 feels ready to go home.  Doug Sou, MD 12/16/10 1902

## 2010-12-16 NOTE — ED Notes (Signed)
Pt transported to and from xray via stretcher with tech. c collar remains intact. C/o nausea after pain meds, MD notified
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
## 2010-12-16 NOTE — ED Notes (Signed)
Pt given information about medication and told to follow up with medical MD.
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
## 2010-12-16 NOTE — ED Notes (Signed)
Pt c/o nausea, given gingerale and crackers. Tolerating without difficulty. Awaiting disposition, no acute distress noted

## 2010-12-18 ENCOUNTER — Encounter: Payer: Self-pay | Admitting: Internal Medicine

## 2010-12-18 ENCOUNTER — Ambulatory Visit (INDEPENDENT_AMBULATORY_CARE_PROVIDER_SITE_OTHER): Payer: No Typology Code available for payment source | Admitting: Internal Medicine

## 2010-12-18 ENCOUNTER — Telehealth: Payer: Self-pay | Admitting: Cardiovascular Disease

## 2010-12-18 DIAGNOSIS — Z0000 Encounter for general adult medical examination without abnormal findings: Secondary | ICD-10-CM | POA: Insufficient documentation

## 2010-12-18 DIAGNOSIS — E1151 Type 2 diabetes mellitus with diabetic peripheral angiopathy without gangrene: Secondary | ICD-10-CM | POA: Insufficient documentation

## 2010-12-18 DIAGNOSIS — E785 Hyperlipidemia, unspecified: Secondary | ICD-10-CM

## 2010-12-18 DIAGNOSIS — Z8679 Personal history of other diseases of the circulatory system: Secondary | ICD-10-CM

## 2010-12-18 DIAGNOSIS — I2510 Atherosclerotic heart disease of native coronary artery without angina pectoris: Secondary | ICD-10-CM

## 2010-12-18 DIAGNOSIS — E119 Type 2 diabetes mellitus without complications: Secondary | ICD-10-CM

## 2010-12-18 DIAGNOSIS — I10 Essential (primary) hypertension: Secondary | ICD-10-CM

## 2010-12-18 DIAGNOSIS — E039 Hypothyroidism, unspecified: Secondary | ICD-10-CM

## 2010-12-18 DIAGNOSIS — I701 Atherosclerosis of renal artery: Secondary | ICD-10-CM

## 2010-12-18 LAB — COMPLETE METABOLIC PANEL WITH GFR
ALT: 13 U/L (ref 0–35)
AST: 13 U/L (ref 0–37)
Albumin: 4.1 g/dL (ref 3.5–5.2)
Alkaline Phosphatase: 54 U/L (ref 39–117)
BUN: 13 mg/dL (ref 6–23)
CO2: 28 mEq/L (ref 19–32)
Calcium: 9.2 mg/dL (ref 8.4–10.5)
Chloride: 103 mEq/L (ref 96–112)
Creat: 0.94 mg/dL (ref 0.50–1.10)
GFR, Est African American: 75 mL/min/{1.73_m2}
GFR, Est Non African American: 65 mL/min/{1.73_m2}
Glucose, Bld: 102 mg/dL — ABNORMAL HIGH (ref 70–99)
Potassium: 3.6 mEq/L (ref 3.5–5.3)
Sodium: 141 mEq/L (ref 135–145)
Total Bilirubin: 0.4 mg/dL (ref 0.3–1.2)
Total Protein: 6.9 g/dL (ref 6.0–8.3)

## 2010-12-18 LAB — POCT GLYCOSYLATED HEMOGLOBIN (HGB A1C): Hemoglobin A1C: 6.5

## 2010-12-18 NOTE — Assessment & Plan Note (Signed)
S/P stent right renal artery, which shown to be patent as of April 2011. Check renal function today, and no further duplex imaging at this time.

## 2010-12-18 NOTE — Patient Instructions (Signed)
Please follow up in 3 months. Please continue to take all medications as prescribed.

## 2010-12-18 NOTE — Assessment & Plan Note (Signed)
Lipids are at goal. Continue statin medication and check LFTs today. No side effects reported from medication.
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
## 2010-12-18 NOTE — Assessment & Plan Note (Signed)
Lab Results  Component Value Date   HGBA1C 6.5 12/18/2010   HGBA1C 7.4* 07/11/2010   CREATININE 1.0 07/11/2010   CHOL 250* 07/11/2010   HDL 39.40 07/11/2010   TRIG 454.0* 07/11/2010    Last eye exam and foot exam: No results found for this basename: HMDIABEYEEXA, HMDIABFOOTEX    Assessment: Diabetes control: controlled Progress toward goals: at goal Barriers to meeting goals: no barriers identified  Plan: Diabetes treatment: Diet and lifestyle modification.Recheck A1C in 3 months to re-evaluate and determine if patient needs to be started on medication such as metformin. Refer to: none Instruction/counseling given: discussed the need for weight loss and discussed diet

## 2010-12-18 NOTE — Assessment & Plan Note (Signed)
Lab Results  Component Value Date   NA 140 07/11/2010   K 3.3* 07/11/2010   CL 101 07/11/2010   CO2 33* 07/11/2010   BUN 12 07/11/2010   CREATININE 1.0 07/11/2010    BP Readings from Last 3 Encounters:  12/18/10 133/79  12/16/10 109/62  07/06/10 159/84    Assessment: Hypertension control:  controlled  Progress toward goals:  unchanged Barriers to meeting goals:  no barriers identified  Plan: Hypertension treatment:  continue current medications
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
## 2010-12-18 NOTE — Telephone Encounter (Signed)
Left message to call back  

## 2010-12-18 NOTE — Assessment & Plan Note (Addendum)
Flu shot today if not already done Mammogram scheduled Defer PAP smear until next office visit Colonoscopy deferred until patient gets orange card

## 2010-12-18 NOTE — Assessment & Plan Note (Addendum)
Stable without angina. Continue current medication with ASA, Bblocker, Statin, and control risk factors such as HTN and DM.

## 2010-12-18 NOTE — Assessment & Plan Note (Signed)
Lab Results  Component Value Date   TSH 4.863* 08/16/2009   Slightly elevated, will recheck thyroid function tests and adjust medication as needed. Currently asymptomatic.

## 2010-12-19 LAB — TSH: TSH: 4.424 u[IU]/mL (ref 0.350–4.500)

## 2010-12-19 LAB — T4, FREE: Free T4: 1.05 ng/dL (ref 0.80–1.80)

## 2010-12-21 ENCOUNTER — Encounter: Payer: Self-pay | Admitting: Nurse Practitioner

## 2010-12-21 ENCOUNTER — Ambulatory Visit (INDEPENDENT_AMBULATORY_CARE_PROVIDER_SITE_OTHER): Payer: No Typology Code available for payment source | Admitting: Nurse Practitioner

## 2010-12-21 DIAGNOSIS — I2510 Atherosclerotic heart disease of native coronary artery without angina pectoris: Secondary | ICD-10-CM

## 2010-12-21 DIAGNOSIS — I10 Essential (primary) hypertension: Secondary | ICD-10-CM

## 2010-12-21 NOTE — Assessment & Plan Note (Signed)
Blood pressure is ok today. No change in her medicines.

## 2010-12-21 NOTE — Patient Instructions (Addendum)
Stay on your current medicines. Use your pain medicine as needed. Use tylenol as needed. May try Alleve very sparingly.  Call us for any problems.   You need to have your regular follow up appointment with Dr. Excell Seltzer in December.  Call the [facility] office at [phone] if you have any questions, problems or concerns.
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
## 2010-12-25 ENCOUNTER — Encounter: Payer: Self-pay | Admitting: Internal Medicine

## 2010-12-25 ENCOUNTER — Ambulatory Visit (INDEPENDENT_AMBULATORY_CARE_PROVIDER_SITE_OTHER): Payer: No Typology Code available for payment source | Admitting: Internal Medicine

## 2010-12-25 DIAGNOSIS — M79609 Pain in unspecified limb: Secondary | ICD-10-CM

## 2010-12-25 MED ORDER — BACLOFEN 10 MG PO TABS: 10.0000 mg | ORAL_TABLET | Freq: Three times a day (TID) | ORAL | Status: DC

## 2010-12-25 MED ORDER — OXYCODONE-ACETAMINOPHEN 5-325 MG PO TABS: 1.0000 | ORAL_TABLET | ORAL | Status: AC | PRN
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
## 2011-01-01 ENCOUNTER — Telehealth: Payer: Self-pay | Admitting: Licensed Clinical Social Worker

## 2011-01-01 ENCOUNTER — Other Ambulatory Visit: Payer: Self-pay | Admitting: Internal Medicine

## 2011-01-01 DIAGNOSIS — Z1231 Encounter for screening mammogram for malignant neoplasm of breast: Secondary | ICD-10-CM

## 2011-01-01 NOTE — Telephone Encounter (Signed)
Patient inquiring about physical therapy referral.

## 2011-01-01 NOTE — Telephone Encounter (Signed)
See previous phone note.  

## 2011-01-01 NOTE — Telephone Encounter (Signed)
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  She told me she had already called [facility] and they encouraged her to visit there during walk-in hours.  She said that she was planning to go this afternoon.  Encouraged her to go there for assessment.   Also inquiring about physical therapy and I told her I would remind nursing about referral and that you are waiting for call on that.

## 2011-01-05 ENCOUNTER — Emergency Department (HOSPITAL_COMMUNITY): Payer: No Typology Code available for payment source

## 2011-01-05 ENCOUNTER — Encounter: Payer: Self-pay | Admitting: Internal Medicine

## 2011-01-05 ENCOUNTER — Encounter (HOSPITAL_COMMUNITY): Payer: Self-pay | Admitting: *Deleted

## 2011-01-05 ENCOUNTER — Emergency Department (HOSPITAL_COMMUNITY)
Admission: EM | Admit: 2011-01-05 | Discharge: 2011-01-05 | Disposition: A | Discharge status: Home / Self Care | Payer: No Typology Code available for payment source | Attending: Emergency Medicine | Admitting: Emergency Medicine

## 2011-01-05 DIAGNOSIS — I2510 Atherosclerotic heart disease of native coronary artery without angina pectoris: Secondary | ICD-10-CM | POA: Insufficient documentation

## 2011-01-05 DIAGNOSIS — Z8673 Personal history of transient ischemic attack (TIA), and cerebral infarction without residual deficits: Secondary | ICD-10-CM | POA: Insufficient documentation

## 2011-01-05 DIAGNOSIS — M62838 Other muscle spasm: Secondary | ICD-10-CM | POA: Insufficient documentation

## 2011-01-05 DIAGNOSIS — M549 Dorsalgia, unspecified: Secondary | ICD-10-CM

## 2011-01-05 DIAGNOSIS — I10 Essential (primary) hypertension: Secondary | ICD-10-CM | POA: Insufficient documentation

## 2011-01-05 MED ORDER — CYCLOBENZAPRINE HCL 10 MG PO TABS: 5.0000 mg | ORAL_TABLET | Freq: Two times a day (BID) | ORAL | Status: AC | PRN

## 2011-01-05 MED ORDER — CYCLOBENZAPRINE HCL 10 MG PO TABS: 5.0000 mg | ORAL_TABLET | Freq: Once | ORAL | Status: DC

## 2011-01-05 MED ORDER — CYCLOBENZAPRINE HCL 10 MG PO TABS
5.0000 mg | ORAL_TABLET | Freq: Once | ORAL | Status: AC
  Administered 2011-01-05: 10 mg via ORAL
  Filled 2011-01-05: qty 1

## 2011-01-05 MED ORDER — OXYCODONE-ACETAMINOPHEN 5-325 MG PO TABS: 1.0000 | ORAL_TABLET | ORAL | Status: AC | PRN

## 2011-01-05 MED ORDER — OXYCODONE-ACETAMINOPHEN 5-325 MG PO TABS
1.0000 | ORAL_TABLET | Freq: Once | ORAL | Status: AC
  Administered 2011-01-05: 1 via ORAL
  Filled 2011-01-05: qty 1

## 2011-01-05 MED ORDER — OXYCODONE-ACETAMINOPHEN 5-325 MG PO TABS: 1.0000 | ORAL_TABLET | Freq: Once | ORAL | Status: DC
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
## 2011-01-05 NOTE — ED Notes (Signed)
ZOX:[id number]<AV> Expected date:01/05/11<BR> Expected time: 3:58 PM<BR> Means of arrival:Ambulance<BR> Comments:<BR> Swollen neck

## 2011-01-05 NOTE — ED Provider Notes (Signed)
Medical screening examination/treatment/procedure(s) were performed by non-physician practitioner and as supervising physician I was immediately available for consultation/collaboration.  Donnetta Hutching, MD 01/05/11 2234

## 2011-01-05 NOTE — Progress Notes (Signed)
Patient was seen in [facility] ER on 01/05/11. PA paged me with a request for [facility] follow up appointment. Pt was seen for severe back pain- but wasn't needed to be admitted.  Please set an appt for her as soon as possible.

## 2011-01-07 NOTE — Progress Notes (Signed)
Appointment set for 12/5

## 2011-01-09 ENCOUNTER — Ambulatory Visit (INDEPENDENT_AMBULATORY_CARE_PROVIDER_SITE_OTHER): Payer: No Typology Code available for payment source | Admitting: Internal Medicine

## 2011-01-09 ENCOUNTER — Encounter: Payer: Self-pay | Admitting: Internal Medicine

## 2011-01-09 DIAGNOSIS — M5380 Other specified dorsopathies, site unspecified: Secondary | ICD-10-CM

## 2011-01-09 DIAGNOSIS — E119 Type 2 diabetes mellitus without complications: Secondary | ICD-10-CM

## 2011-01-09 DIAGNOSIS — I10 Essential (primary) hypertension: Secondary | ICD-10-CM

## 2011-01-09 DIAGNOSIS — M62830 Muscle spasm of back: Secondary | ICD-10-CM | POA: Insufficient documentation

## 2011-01-09 NOTE — Assessment & Plan Note (Signed)
Patient was confused that she was told that she has diabetes was and then she was told she does not have diabetes. I looked at the chart and discussed with her that since her hemoglobin A1c got back to normal without any diabetic medications- she is not defined is a diabetic as of now. Although we will recheck her and will see as planned in 3 months from last check on 12/18/2010- and if her hemoglobin A1c is less than 6.5 there will be reassuring.

## 2011-01-09 NOTE — Assessment & Plan Note (Signed)
Lab Results  Component Value Date   NA 141 12/18/2010   K 3.6 12/18/2010   CL 103 12/18/2010   CO2 28 12/18/2010   BUN 13 12/18/2010   CREATININE 0.94 12/18/2010   CREATININE 1.0 07/11/2010    BP Readings from Last 3 Encounters:  01/09/11 160/104  01/05/11 169/84  12/25/10 120/72    Assessment: Hypertension control:  moderately elevated  Progress toward goals:  deteriorated Barriers to meeting goals:  Pain and stress.  Plan: Hypertension treatment:  continue current medications. Reevaluate once her pain is resolved.
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
## 2011-01-09 NOTE — Patient Instructions (Signed)
Please make followup appointment as needed.  Followup with physical therapy and follow their recommendations for exercise. For now take Flexeril and Percocet in minimal amounts as do interfere with your work. While you are working, if you have more pain- try to use Tylenol and Aleve together 2-3 times a day. Try to move around as much as you can- but don't do any extreme physical activity. If you pain gets really bad call us to make an early appointment.

## 2011-01-09 NOTE — Assessment & Plan Note (Signed)
As described in history of present illness, she is on Percocet which she takes about one pill a day, Flexeril which she takes one pill at night, and baclofen which she's not sure about the dosing. She says that Percocet makes her sleepy and so she was to avoid using more of it. She does not request further prescription today. I advised her to take when necessary Tylenol and Aleve when she is working- as she works at [employer]- so that it would not make her sleepy, and use Percocet when she is home. I advised her not to take Aleve for long period of time considering her CAD and hypertension. She will follow with physical therapy with whom she has last appointment on 01/23/2011. She verbalized understanding of the plan. She will make an appointment as needed.

## 2011-01-23 ENCOUNTER — Emergency Department (HOSPITAL_COMMUNITY)
Admission: EM | Admit: 2011-01-23 | Discharge: 2011-01-23 | Disposition: A | Discharge status: Home / Self Care | Payer: No Typology Code available for payment source | Attending: Emergency Medicine | Admitting: Emergency Medicine

## 2011-01-23 ENCOUNTER — Emergency Department (HOSPITAL_COMMUNITY): Payer: No Typology Code available for payment source

## 2011-01-23 ENCOUNTER — Telehealth: Payer: Self-pay | Admitting: *Deleted

## 2011-01-23 ENCOUNTER — Encounter (HOSPITAL_COMMUNITY): Payer: Self-pay | Admitting: Emergency Medicine

## 2011-01-23 ENCOUNTER — Ambulatory Visit
Payer: No Typology Code available for payment source | Attending: Internal Medicine | Admitting: Rehabilitative and Restorative Service Providers"

## 2011-01-23 DIAGNOSIS — M545 Low back pain, unspecified: Secondary | ICD-10-CM | POA: Insufficient documentation

## 2011-01-23 DIAGNOSIS — Z79899 Other long term (current) drug therapy: Secondary | ICD-10-CM | POA: Insufficient documentation

## 2011-01-23 DIAGNOSIS — E876 Hypokalemia: Secondary | ICD-10-CM | POA: Insufficient documentation

## 2011-01-23 DIAGNOSIS — M2550 Pain in unspecified joint: Secondary | ICD-10-CM | POA: Insufficient documentation

## 2011-01-23 DIAGNOSIS — Z87891 Personal history of nicotine dependence: Secondary | ICD-10-CM | POA: Insufficient documentation

## 2011-01-23 DIAGNOSIS — M25569 Pain in unspecified knee: Secondary | ICD-10-CM | POA: Insufficient documentation

## 2011-01-23 DIAGNOSIS — I2510 Atherosclerotic heart disease of native coronary artery without angina pectoris: Secondary | ICD-10-CM | POA: Insufficient documentation

## 2011-01-23 DIAGNOSIS — E039 Hypothyroidism, unspecified: Secondary | ICD-10-CM | POA: Insufficient documentation

## 2011-01-23 DIAGNOSIS — T502X5A Adverse effect of carbonic-anhydrase inhibitors, benzothiadiazides and other diuretics, initial encounter: Secondary | ICD-10-CM | POA: Insufficient documentation

## 2011-01-23 DIAGNOSIS — M549 Dorsalgia, unspecified: Secondary | ICD-10-CM

## 2011-01-23 DIAGNOSIS — IMO0001 Reserved for inherently not codable concepts without codable children: Secondary | ICD-10-CM | POA: Insufficient documentation

## 2011-01-23 DIAGNOSIS — M79606 Pain in leg, unspecified: Secondary | ICD-10-CM

## 2011-01-23 LAB — POCT I-STAT, CHEM 8
BUN: 7 mg/dL (ref 6–23)
Calcium, Ion: 1.14 mmol/L (ref 1.12–1.32)
Chloride: 102 mEq/L (ref 96–112)
Creatinine, Ser: 1 mg/dL (ref 0.50–1.10)
Glucose, Bld: 108 mg/dL — ABNORMAL HIGH (ref 70–99)
HCT: 39 % (ref 36.0–46.0)
Hemoglobin: 13.3 g/dL (ref 12.0–15.0)
Potassium: 3.1 mEq/L — ABNORMAL LOW (ref 3.5–5.1)
Sodium: 143 mEq/L (ref 135–145)
TCO2: 30 mmol/L (ref 0–100)

## 2011-01-23 LAB — D-DIMER, QUANTITATIVE: D-Dimer, Quant: 0.8 ug/mL-FEU — ABNORMAL HIGH (ref 0.00–0.48)

## 2011-01-23 MED ORDER — ONDANSETRON 4 MG PO TBDP
8.0000 mg | ORAL_TABLET | Freq: Once | ORAL | Status: AC
  Administered 2011-01-23: 8 mg via ORAL

## 2011-01-23 MED ORDER — POTASSIUM CHLORIDE 20 MEQ/15ML (10%) PO LIQD
40.0000 meq | Freq: Once | ORAL | Status: AC
  Administered 2011-01-23: 40 meq via ORAL
  Filled 2011-01-23: qty 30

## 2011-01-23 MED ORDER — ENOXAPARIN SODIUM 100 MG/ML ~~LOC~~ SOLN
100.0000 mg | SUBCUTANEOUS | Status: AC
  Administered 2011-01-23: 100 mg via SUBCUTANEOUS
  Filled 2011-01-23: qty 1

## 2011-01-23 MED ORDER — ONDANSETRON 4 MG PO TBDP
ORAL_TABLET | ORAL | Status: AC
  Administered 2011-01-23: 8 mg via ORAL
  Filled 2011-01-23: qty 2

## 2011-01-23 MED ORDER — IBUPROFEN 800 MG PO TABS
800.0000 mg | ORAL_TABLET | Freq: Once | ORAL | Status: AC
  Administered 2011-01-23: 800 mg via ORAL
  Filled 2011-01-23: qty 1

## 2011-01-23 NOTE — ED Notes (Signed)
Pt ambulated with a steady gait; VSS; no signs of distress; skin warm and dry; respirations even and unlabored. Pt reports she will follow d/c instructions and has no questions at this time.

## 2011-01-23 NOTE — Progress Notes (Signed)
ANTICOAGULATION CONSULT NOTE - Initial Consult  Pharmacy Consult for  lovenox Indication: r/o DVT  No Known Allergies  Patient Measurements: Height: 5\' 5"  (165.1 cm) Weight: 211 lb (95.709 kg) IBW/kg (Calculated) : 57   Vital Signs: Temp: 98.6 F (37 C) (12/19 1706) Temp src: Oral (12/19 1706) BP: 179/75 mmHg (12/19 1706) Pulse Rate: 62  (12/19 1706)  Labs:  [facility] 01/23/11 2124  HGB 13.3  HCT 39.0  PLT --  APTT --  LABPROT --  INR --  HEPARINUNFRC --  CREATININE 1.00  CKTOTAL --  CKMB --  TROPONINI --   Estimated Creatinine Clearance: 65.9 ml/min (by C-G formula based on Cr of 1).  Medical History: Past Medical History  Diagnosis Date  . Hypertension   . Cervical polyp   . Vaginal discharge   . Neck mass   . Cervical lymphadenopathy   . Urinary tract infection   . Renal artery stenosis   . Cerebrovascular accident   . Hypothyroidism   . Hypertension   . Hyperlipidemia   . Coronary artery disease     Prior stent of the lCX with chronic total occlusion of a small RCA. Last cath in 2004 showing patent stent, nonobstructive disease and total RCA. EF is 65 to 70%. She has been managed medically.   . Renal artery stenosis     Assessment: 65 yo female with R leg swelling and pain and concern for DVT to start lovenox   Plan:  -Will give lovenox 100mg   q12h and follow CBC every 3 days  Benny Lennert 01/23/2011,10:21 PM

## 2011-01-23 NOTE — ED Notes (Signed)
Reassessed pt.  States pain is increaseing d/t lack of pain meds.

## 2011-01-23 NOTE — Telephone Encounter (Signed)
Agree that it is not in pts best interest to order MRI or Doppler without eval first . To UC or ER.

## 2011-01-23 NOTE — ED Notes (Signed)
Returned from xray
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
## 2011-01-23 NOTE — ED Notes (Signed)
Patient transported to X-ray 
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
## 2011-01-23 NOTE — Telephone Encounter (Signed)
[name] at [facility] calls to say pt is c/o pain in R leg along with, swelling of R leg and foot, warmer to touch than L, very tender to touch, denies discoloration. She states she feels pt needs to be evaluated for DVT and possibly needs mri to R leg/ hip. Spoke w/ dr Midwife, will advise that pt be sent to ED or urg care asap. Do not wait until tomorrow. She repeats back instructions and call is ended

## 2011-01-24 ENCOUNTER — Ambulatory Visit (HOSPITAL_COMMUNITY)
Admission: RE | Admit: 2011-01-24 | Discharge: 2011-01-24 | Disposition: A | Discharge status: Home / Self Care | Payer: No Typology Code available for payment source | Source: Ambulatory Visit | Attending: Internal Medicine | Admitting: Internal Medicine

## 2011-01-24 DIAGNOSIS — M7989 Other specified soft tissue disorders: Secondary | ICD-10-CM

## 2011-01-24 DIAGNOSIS — M79609 Pain in unspecified limb: Secondary | ICD-10-CM | POA: Insufficient documentation

## 2011-01-24 DIAGNOSIS — R609 Edema, unspecified: Secondary | ICD-10-CM

## 2011-01-24 NOTE — Progress Notes (Signed)
*  PRELIMINARY RESULTS*  RLEV Duplex has been performed.  No obvious evidence of deep vein thrombosis involving the right lower extremity. No obvious evidence of a Baker's Cyst on the right.   [name] 01/24/2011, 10:41 AM

## 2011-01-25 ENCOUNTER — Ambulatory Visit: Payer: Self-pay | Admitting: Cardiovascular Disease

## 2011-01-25 ENCOUNTER — Ambulatory Visit (INDEPENDENT_AMBULATORY_CARE_PROVIDER_SITE_OTHER): Payer: No Typology Code available for payment source | Admitting: Internal Medicine

## 2011-01-25 ENCOUNTER — Encounter: Payer: Self-pay | Admitting: Internal Medicine

## 2011-01-25 VITALS — BP 150/74 | HR 56 | Temp 98.0°F | Wt 209.8 lb

## 2011-01-25 DIAGNOSIS — F32A Depression, unspecified: Secondary | ICD-10-CM | POA: Insufficient documentation

## 2011-01-25 DIAGNOSIS — I2510 Atherosclerotic heart disease of native coronary artery without angina pectoris: Secondary | ICD-10-CM

## 2011-01-25 DIAGNOSIS — F329 Major depressive disorder, single episode, unspecified: Secondary | ICD-10-CM

## 2011-01-25 MED ORDER — ROSUVASTATIN CALCIUM 10 MG PO TABS: 10.0000 mg | ORAL_TABLET | Freq: Every day | ORAL | Status: DC

## 2011-01-25 MED ORDER — FLUOXETINE HCL 10 MG PO CAPS: 10.0000 mg | ORAL_CAPSULE | Freq: Every day | ORAL | Status: DC
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
## 2011-01-25 NOTE — Patient Instructions (Addendum)
Please, start medication for depression : Fluoxetine 10 mg one tablet before bedtime. Please, call Ms. Georges Mouse, Child psychotherapist as was discussed. Please, call with any questions/concerns. Please, follow up in 4-6 weeks.  Depression is a strong emotion of feeling unhappy that can last for weeks, months, or even longer. Depression causes problems with the ability to function in life. It upsets your:   Relationships.   Sleep.   Eating habits.   Work habits.  HOME CARE  Take all medicine as told by your doctor.   Talk with a therapist, counselor, or friend.   Eat a healthy diet.   Exercise regularly.   Do not drink alcohol or use drugs.  GET HELP RIGHT AWAY IF: You start to have thoughts about hurting yourself or others. MAKE SURE YOU:  Understand these instructions.   Will watch your condition.   Will get help right away if you are not doing well or get worse.  Document Released: 02/23/2010 Document Revised: 10/03/2010 Document Reviewed: 02/23/2010 [facility] Patient Information 2012 [place], [place]  Merry Christmas and Happy New Year!!!!

## 2011-01-30 ENCOUNTER — Ambulatory Visit: Payer: No Typology Code available for payment source | Admitting: Physical Therapy

## 2011-01-30 NOTE — ED Provider Notes (Signed)
Medical screening examination/treatment/procedure(s) were performed by non-physician practitioner and as supervising physician I was immediately available for consultation/collaboration.   Manuelita Moxon E Jaques Mineer, MD 01/30/11 0750 

## 2011-02-01 ENCOUNTER — Ambulatory Visit (HOSPITAL_COMMUNITY): Payer: No Typology Code available for payment source

## 2011-02-04 ENCOUNTER — Ambulatory Visit: Payer: No Typology Code available for payment source | Admitting: Rehabilitative and Restorative Service Providers"

## 2011-02-06 ENCOUNTER — Ambulatory Visit: Payer: No Typology Code available for payment source | Attending: Internal Medicine | Admitting: Physical Therapy

## 2011-02-06 DIAGNOSIS — M545 Low back pain, unspecified: Secondary | ICD-10-CM | POA: Insufficient documentation

## 2011-02-06 DIAGNOSIS — IMO0001 Reserved for inherently not codable concepts without codable children: Secondary | ICD-10-CM | POA: Insufficient documentation

## 2011-02-06 DIAGNOSIS — M2550 Pain in unspecified joint: Secondary | ICD-10-CM | POA: Insufficient documentation

## 2011-02-08 ENCOUNTER — Telehealth: Payer: Self-pay | Admitting: Licensed Clinical Social Worker

## 2011-02-08 NOTE — Telephone Encounter (Signed)
Patient said she was doing well on medication and reported no side effects. Patient was referred to me back in November and I had given her information for [facility].  She promised to f/u but she never did. Called patient again today and explained options for mental health counseling.  Explained that she can go via walk-in basis to either [facility] or [facility].  She has a choice and they will serve uninsureds.  The patient said she knew where both places were and would follow-up and call me as to how she did.   In the meantime, Dr. Denton Meek has prescribed Prozac to address Depression.

## 2011-02-13 ENCOUNTER — Ambulatory Visit: Payer: No Typology Code available for payment source | Admitting: Rehabilitation

## 2011-02-20 ENCOUNTER — Ambulatory Visit: Payer: No Typology Code available for payment source | Admitting: Rehabilitative and Restorative Service Providers"

## 2011-02-21 ENCOUNTER — Encounter: Payer: Self-pay | Admitting: Cardiovascular Disease

## 2011-02-21 ENCOUNTER — Ambulatory Visit (INDEPENDENT_AMBULATORY_CARE_PROVIDER_SITE_OTHER): Payer: No Typology Code available for payment source | Admitting: Cardiovascular Disease

## 2011-02-21 DIAGNOSIS — E785 Hyperlipidemia, unspecified: Secondary | ICD-10-CM

## 2011-02-21 DIAGNOSIS — I2510 Atherosclerotic heart disease of native coronary artery without angina pectoris: Secondary | ICD-10-CM

## 2011-02-21 DIAGNOSIS — I10 Essential (primary) hypertension: Secondary | ICD-10-CM

## 2011-02-21 MED ORDER — CARVEDILOL 25 MG PO TABS: 25.0000 mg | ORAL_TABLET | Freq: Two times a day (BID) | ORAL | Status: DC

## 2011-02-21 MED ORDER — NITROGLYCERIN 0.4 MG SL SUBL: 0.4000 mg | SUBLINGUAL_TABLET | SUBLINGUAL | Status: DC | PRN

## 2011-02-21 NOTE — Assessment & Plan Note (Signed)
She was recently started on Crestor 10 mg but continues to take simvastatin. I recommended that she discontinue the simvastatin and stay on a Crestor. She should have repeat lipids and LFTs in about 3 months. She is being followed by her primary care physician.
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
## 2011-02-21 NOTE — Assessment & Plan Note (Signed)
Blood pressure control is suboptimal. 2 of her antihypertensive medication bottles are empty today, but she tells me she just ran out of them. I recommended that she increase her carvedilol 25 mg twice daily and continue on her current dose of lisinopril/hydrochlorothiazide.

## 2011-02-21 NOTE — Patient Instructions (Signed)
Your physician wants you to follow-up in: 6 MONTHS.  You will receive a reminder letter in the mail two months in advance. If you don't receive a letter, please call our office to schedule the follow-up appointment.  Your physician has recommended you make the following change in your medication: STOP Simvastatin, INCREASE Carvedilol to 25mg  take one by mouth twice a day  Dr Excell Seltzer recommends that you have an x-ray to evaluate your chest and ribs.  This should be done in the Radiology department at [facility].

## 2011-02-21 NOTE — Assessment & Plan Note (Addendum)
The patient is stable without angina the present time. She will continue on antiplatelet therapy with aspirin. See below for adjustments in her blood pressure medication.  With respect to the bony prominence on her physical exam, will check a chest x-ray with focus on the ribs.

## 2011-02-25 ENCOUNTER — Ambulatory Visit: Payer: No Typology Code available for payment source

## 2011-02-26 ENCOUNTER — Encounter (HOSPITAL_COMMUNITY): Payer: Self-pay | Admitting: Emergency Medicine

## 2011-02-26 ENCOUNTER — Emergency Department (HOSPITAL_COMMUNITY): Payer: No Typology Code available for payment source

## 2011-02-26 ENCOUNTER — Emergency Department (HOSPITAL_COMMUNITY)
Admission: EM | Admit: 2011-02-26 | Discharge: 2011-02-26 | Disposition: A | Discharge status: Home / Self Care | Payer: No Typology Code available for payment source | Attending: Emergency Medicine | Admitting: Emergency Medicine

## 2011-02-26 DIAGNOSIS — E039 Hypothyroidism, unspecified: Secondary | ICD-10-CM | POA: Insufficient documentation

## 2011-02-26 DIAGNOSIS — I2510 Atherosclerotic heart disease of native coronary artery without angina pectoris: Secondary | ICD-10-CM | POA: Insufficient documentation

## 2011-02-26 DIAGNOSIS — R222 Localized swelling, mass and lump, trunk: Secondary | ICD-10-CM

## 2011-02-26 DIAGNOSIS — R220 Localized swelling, mass and lump, head: Secondary | ICD-10-CM | POA: Insufficient documentation

## 2011-02-26 DIAGNOSIS — Z8679 Personal history of other diseases of the circulatory system: Secondary | ICD-10-CM | POA: Insufficient documentation

## 2011-02-26 DIAGNOSIS — M25519 Pain in unspecified shoulder: Secondary | ICD-10-CM | POA: Insufficient documentation

## 2011-02-26 DIAGNOSIS — I10 Essential (primary) hypertension: Secondary | ICD-10-CM | POA: Insufficient documentation

## 2011-02-26 DIAGNOSIS — Z79899 Other long term (current) drug therapy: Secondary | ICD-10-CM | POA: Insufficient documentation

## 2011-02-26 NOTE — ED Notes (Signed)
Pt c/o knot on right side of collar bone noticed about a week ago
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
## 2011-02-26 NOTE — ED Provider Notes (Signed)
Medical screening examination/treatment/procedure(s) were performed by non-physician practitioner and as supervising physician I was immediately available for consultation/collaboration.  Graesyn Schreifels R. Santhosh Gulino, MD 02/26/11 2328 

## 2011-02-27 ENCOUNTER — Encounter: Payer: Self-pay | Admitting: Rehabilitative and Restorative Service Providers"

## 2011-02-28 ENCOUNTER — Ambulatory Visit (HOSPITAL_COMMUNITY)
Admission: RE | Admit: 2011-02-28 | Discharge: 2011-02-28 | Disposition: A | Discharge status: Home / Self Care | Payer: No Typology Code available for payment source | Source: Ambulatory Visit | Attending: Internal Medicine | Admitting: Internal Medicine

## 2011-02-28 DIAGNOSIS — Z1231 Encounter for screening mammogram for malignant neoplasm of breast: Secondary | ICD-10-CM

## 2011-04-16 ENCOUNTER — Ambulatory Visit (INDEPENDENT_AMBULATORY_CARE_PROVIDER_SITE_OTHER): Payer: No Typology Code available for payment source | Admitting: Internal Medicine

## 2011-04-16 ENCOUNTER — Encounter: Payer: Self-pay | Admitting: Internal Medicine

## 2011-04-16 DIAGNOSIS — E785 Hyperlipidemia, unspecified: Secondary | ICD-10-CM

## 2011-04-16 DIAGNOSIS — I10 Essential (primary) hypertension: Secondary | ICD-10-CM

## 2011-04-16 DIAGNOSIS — Z79899 Other long term (current) drug therapy: Secondary | ICD-10-CM

## 2011-04-16 DIAGNOSIS — E039 Hypothyroidism, unspecified: Secondary | ICD-10-CM

## 2011-04-16 DIAGNOSIS — E119 Type 2 diabetes mellitus without complications: Secondary | ICD-10-CM

## 2011-04-16 LAB — POCT GLYCOSYLATED HEMOGLOBIN (HGB A1C): Hemoglobin A1C: 6.5

## 2011-04-16 LAB — GLUCOSE, CAPILLARY: Glucose-Capillary: 140 mg/dL — ABNORMAL HIGH (ref 70–99)

## 2011-04-16 MED ORDER — ROSUVASTATIN CALCIUM 10 MG PO TABS: 10.0000 mg | ORAL_TABLET | Freq: Every day | ORAL | Status: DC

## 2011-04-16 MED ORDER — LISINOPRIL-HYDROCHLOROTHIAZIDE 20-12.5 MG PO TABS: 1.0000 | ORAL_TABLET | Freq: Two times a day (BID) | ORAL | Status: DC

## 2011-04-16 NOTE — Assessment & Plan Note (Signed)
Last LDL was at goal, check fasting lipid today

## 2011-04-16 NOTE — Assessment & Plan Note (Signed)
Patient still complains of right thigh pain, would like another referral to physical therapy to continue work on her gait. Referral provided

## 2011-04-16 NOTE — Assessment & Plan Note (Signed)
Patient's blood pressures elevated this is secondary to noncompliance with lisinopril/hydrochlorothiazide, prescription given we'll reevaluate blood pressure at next visit

## 2011-04-16 NOTE — Patient Instructions (Signed)
A prescription for your medication lisinopril-hydrochlorothiazide has been sent to the pharmacy please take as instructed for your blood pressure

## 2011-04-16 NOTE — Assessment & Plan Note (Signed)
Patient is borderline diabetic with an A1c of 6.5 today, will start medical therapy until her A1c is above 7, will recheck A1c in 3 months, and encourage diet and exercise for now

## 2011-04-16 NOTE — Assessment & Plan Note (Addendum)
Taking Synthroid, check TSH today  Addendum: TSH came back elevated above 35, will increase Synthroid to 125 mcg, and recheck TSH at next visit
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
## 2011-04-18 ENCOUNTER — Other Ambulatory Visit: Payer: Self-pay

## 2011-04-22 ENCOUNTER — Other Ambulatory Visit (INDEPENDENT_AMBULATORY_CARE_PROVIDER_SITE_OTHER): Payer: No Typology Code available for payment source

## 2011-04-22 DIAGNOSIS — E039 Hypothyroidism, unspecified: Secondary | ICD-10-CM

## 2011-04-22 DIAGNOSIS — E119 Type 2 diabetes mellitus without complications: Secondary | ICD-10-CM

## 2011-04-22 LAB — LIPID PANEL
Cholesterol: 152 mg/dL (ref 0–200)
HDL: 30 mg/dL — ABNORMAL LOW (ref >39)
LDL Cholesterol: 102 mg/dL — ABNORMAL HIGH (ref 0–99)
Total CHOL/HDL Ratio: 5.1 Ratio
Triglycerides: 101 mg/dL (ref <150)
VLDL: 20 mg/dL (ref 0–40)

## 2011-04-22 LAB — BASIC METABOLIC PANEL
BUN: 13 mg/dL (ref 6–23)
CO2: 30 mEq/L (ref 19–32)
Calcium: 9 mg/dL (ref 8.4–10.5)
Chloride: 104 mEq/L (ref 96–112)
Creat: 0.99 mg/dL (ref 0.50–1.10)
Glucose, Bld: 114 mg/dL — ABNORMAL HIGH (ref 70–99)
Potassium: 3.7 mEq/L (ref 3.5–5.3)
Sodium: 141 mEq/L (ref 135–145)

## 2011-04-23 LAB — TSH: TSH: 31.676 u[IU]/mL — ABNORMAL HIGH (ref 0.350–4.500)

## 2011-04-23 MED ORDER — LEVOTHYROXINE SODIUM 125 MCG PO TABS: 125.0000 ug | ORAL_TABLET | Freq: Every day | ORAL | Status: DC

## 2011-04-23 NOTE — Progress Notes (Signed)
Addended by: Darnelle Maffucci on: 04/23/2011 11:30 AM   Modules accepted: Orders

## 2011-04-30 ENCOUNTER — Ambulatory Visit
Payer: No Typology Code available for payment source | Attending: Internal Medicine | Admitting: Rehabilitative and Restorative Service Providers"

## 2011-04-30 DIAGNOSIS — M545 Low back pain, unspecified: Secondary | ICD-10-CM | POA: Insufficient documentation

## 2011-04-30 DIAGNOSIS — M2550 Pain in unspecified joint: Secondary | ICD-10-CM | POA: Insufficient documentation

## 2011-04-30 DIAGNOSIS — IMO0001 Reserved for inherently not codable concepts without codable children: Secondary | ICD-10-CM | POA: Insufficient documentation

## 2011-06-10 NOTE — Progress Notes (Signed)
Addended by: Neomia Dear on: 06/10/2011 04:05 PM   Modules accepted: Orders

## 2011-06-14 ENCOUNTER — Other Ambulatory Visit: Payer: Self-pay | Admitting: *Deleted

## 2011-06-14 DIAGNOSIS — E039 Hypothyroidism, unspecified: Secondary | ICD-10-CM

## 2011-06-18 MED ORDER — LEVOTHYROXINE SODIUM 125 MCG PO TABS: 125.0000 ug | ORAL_TABLET | Freq: Every day | ORAL | Status: DC

## 2011-09-19 ENCOUNTER — Ambulatory Visit (INDEPENDENT_AMBULATORY_CARE_PROVIDER_SITE_OTHER): Payer: Self-pay | Admitting: Internal Medicine

## 2011-09-19 ENCOUNTER — Encounter: Payer: Self-pay | Admitting: Internal Medicine

## 2011-09-19 VITALS — BP 179/93 | HR 61 | Temp 97.6°F | Ht 65.0 in | Wt 211.1 lb

## 2011-09-19 DIAGNOSIS — E785 Hyperlipidemia, unspecified: Secondary | ICD-10-CM

## 2011-09-19 DIAGNOSIS — Z8719 Personal history of other diseases of the digestive system: Secondary | ICD-10-CM | POA: Insufficient documentation

## 2011-09-19 DIAGNOSIS — Z79899 Other long term (current) drug therapy: Secondary | ICD-10-CM

## 2011-09-19 DIAGNOSIS — K469 Unspecified abdominal hernia without obstruction or gangrene: Secondary | ICD-10-CM

## 2011-09-19 DIAGNOSIS — I10 Essential (primary) hypertension: Secondary | ICD-10-CM

## 2011-09-19 DIAGNOSIS — K429 Umbilical hernia without obstruction or gangrene: Secondary | ICD-10-CM | POA: Insufficient documentation

## 2011-09-19 DIAGNOSIS — N390 Urinary tract infection, site not specified: Secondary | ICD-10-CM

## 2011-09-19 LAB — GLUCOSE, CAPILLARY: Glucose-Capillary: 104 mg/dL — ABNORMAL HIGH (ref 70–99)

## 2011-09-19 LAB — POCT GLYCOSYLATED HEMOGLOBIN (HGB A1C): Hemoglobin A1C: 6.5

## 2011-09-19 MED ORDER — NITROFURANTOIN MACROCRYSTAL 100 MG PO CAPS: 100.0000 mg | ORAL_CAPSULE | Freq: Two times a day (BID) | ORAL | Status: AC

## 2011-09-19 NOTE — Progress Notes (Signed)
Patient ID: TALISHA ERBY, female   DOB: November 04, 1945, 66 y.o.   MRN: [id number]  Subjective:   Patient ID: BURNETTA KOHLS female   DOB: 02/10/1945 66 y.o.   MRN: [id number]  HPI: Ms.Krystalynn L Tramontana is a 66 y.o. woman with PMH of HTN, HLD, CVA and Renal artery stenosis,  Who presents to the clinic for follow up visit.  1. UTI Patient reports dysuria, urinary frequency and urgency for a few days. Denies fever, chills and night sweats. Denies any other discomfort.   2. Abdominal hernia  Patient reports that she has had abdominal hernia evaluated by her surgeon 3-4 years ago. No intervention suggested. She reports that she noticed more prominence of hernia at times especially around her umbilical area with mild intermittent abdominal pain. Denies bowel changes or bloody stools. She states that her hernia is actually better today.  3. HTN Patient is noted to have high BP at 179/93 today. She reports medical compliance with all of her medications.  She does not check her BP at home and does not know her baseline.  Review of Systems:  Constitutional:  Denies fever, chills, diaphoresis, appetite change and fatigue.   HEENT:  Denies congestion, sore throat, rhinorrhea, sneezing, mouth sores, trouble swallowing, neck pain   Respiratory:  Denies SOB, DOE, cough, and wheezing.   Cardiovascular:  Denies palpitations and leg swelling.   Gastrointestinal:  Denies nausea, vomiting, abdominal pain, diarrhea, constipation, blood in stool and abdominal distention.   Genitourinary:  Positive for dysuria, urgency, frequency, denies hematuria, flank pain and difficulty urinating.   Musculoskeletal:  Denies myalgias, back pain, joint swelling, arthralgias and gait problem.   Skin:  Denies pallor, rash and wound.   Neurological:  Denies dizziness, seizures, syncope, weakness, light-headedness, numbness and headaches.    .     Past Medical History  Diagnosis Date  . Hypertension   . Cervical polyp   . Vaginal  Discharge   . Neck mass   . Cervical lymphadenopathy   . Urinary tract infection   . Renal artery stenosis   . Cerebrovascular accident   . Hypothyroidism   . Hypertension   . Hyperlipidemia   . Coronary artery disease     Prior stent of the lCX with chronic total occlusion of a small RCA. Last cath in 2004 showing patent stent, nonobstructive disease and total RCA. EF is 65 to 70%. She has been managed medically.   . Renal artery stenosis    Current Outpatient Prescriptions  Medication Sig Dispense Refill  . amLODipine (NORVASC) 10 MG tablet Take 1 tablet (10 mg total) by mouth daily.  30 tablet  12  . aspirin 325 MG tablet Take 325 mg by mouth daily.        . baclofen (LIORESAL) 10 MG tablet Take 10 mg by mouth 3 (three) times daily.       . carvedilol (COREG) 25 MG tablet Take 25 mg by mouth 2 (two) times daily with a meal.      . FLUoxetine (PROZAC) 10 MG capsule Take 10 mg by mouth daily.      Marland Kitchen levothyroxine (SYNTHROID, LEVOTHROID) 125 MCG tablet Take 1 tablet (125 mcg total) by mouth daily.  30 tablet  0  . lisinopril-hydrochlorothiazide (PRINZIDE) 20-12.5 MG per tablet Take 1 tablet by mouth 2 (two) times daily.  60 tablet  11  . nitroGLYCERIN (NITROSTAT) 0.4 MG SL tablet Place 0.4 mg under the tongue every 5 (five) minutes as needed. Chest  pain      . rosuvastatin (CRESTOR) 10 MG tablet Take 1 tablet (10 mg total) by mouth at bedtime.  30 tablet  10  . nitrofurantoin (MACRODANTIN) 100 MG capsule Take 1 capsule (100 mg total) by mouth 2 (two) times daily.  14 capsule  0   Family History  Problem Relation Age of Onset  . Hypertension Mother   . Osteoarthritis Mother   . Alzheimer's disease Father   . Cancer Neg Hx   . Stroke Neg Hx    History   Social History  . Marital Status: Single    Spouse Name: N/A    Number of Children: N/A  . Years of Education: N/A   Social History Main Topics  . Smoking status: Former Smoker    Quit date: 02/04/1993  . Smokeless tobacco:  None  . Alcohol Use: Yes     Wine on special occasions  . Drug Use: No  . Sexually Active: None   Other Topics Concern  . None   Social History Narrative  . None   Review of Systems: See HPI  Objective:  Physical Exam: Filed Vitals:   09/19/11 1604  BP: 179/93  Pulse: 61  Temp: 97.6 F (36.4 C)  TempSrc: Oral  Height: 5\' 5"  (1.651 m)  Weight: 211 lb 1.6 oz (95.754 kg)   General: alert, well-developed, and cooperative to examination.  Head: normocephalic and atraumatic.  Eyes: vision grossly intact, pupils equal, pupils round, pupils reactive to light, no injection and anicteric.  Mouth: pharynx pink and moist, no erythema, and no exudates.  Neck: supple, full ROM, no thyromegaly, no JVD, and no carotid bruits.  Lungs: normal respiratory effort, no accessory muscle use, normal breath sounds, no crackles, and no wheezes. Heart: normal rate, regular rhythm, no murmur, no gallop, and no rub.  Abdomen: soft, non-tender, normal bowel sounds, no distention, no guarding, no rebound tenderness, no hepatomegaly, and no splenomegaly.  No obvious hernia noted. Msk: no joint swelling, no joint warmth, and no redness over joints.  Pulses: 2+ DP/PT pulses bilaterally Extremities: No cyanosis, clubbing, edema Neurologic: alert & oriented X3, cranial nerves II-XII intact, strength normal in all extremities, sensation intact to light touch, and gait normal.  Skin: turgor normal and no rashes.  Psych: Oriented X3, memory intact for recent and remote, normally interactive, good eye contact, not anxious appearing, and not depressed appearing.   Assessment & Plan:

## 2011-09-19 NOTE — Patient Instructions (Addendum)
1. Please pick up your antibiotics for urinary tract infection 2. Follow up in one week

## 2011-09-19 NOTE — Assessment & Plan Note (Signed)
The clinic manifestation is consistent with the UTI.  Small leukocyte noted in urine dipstick at the office.  - will send for UA - treat her with Macrodantin

## 2011-09-19 NOTE — Assessment & Plan Note (Signed)
Self reported abdominal hernia with ? Surgical evaluation 3 years ago with no intervention suggested.  No s/s incarceration noted.   - patient states that she can find her surgeon's information at home - instructed her to call her surgeon's office for a follow up visit. -patient is instructed to go to ED or come back to the clinic of she experience severe abdominal pain or bloody stools.

## 2011-09-19 NOTE — Assessment & Plan Note (Addendum)
High BP at the clinic today. I have checked her BP trend and notice that she has had gradual elevated BP for months. She is already on multiple antihypertensive medications including HCTZ, ACEI, Coreg and CCB.  I would need more time to figure out why her BP are elevated. 1. I will need to call her Pharmacy to check her medical compliance and ask her to bring all of her bottles as well during next visit.  2. If she is indeed compliant, given her known history of renal artery stenosis, I will check her BMP and try to max her medical treatment.  3. If she remains hypertensive, we will need further evaluation of her renal artery stenosis.   Addendum: Discussed with Dr. Kem Kays  1. Will keep her on BB and CCB, and stop her ACEI/HCTZ combo if her BP remains high in one week. Will consider  Chlorthalidone 25 mg po daily for diuretics, and continue her lisinopril at 20 mg daily 2. If above med does not achieve optimal BP control, will consider change BB from coreg to Labetalol since the latter has more Alpha than Beta Blockade effect, we can start from 100 mg po BID and titrate up to 300 mg po BID if  needed to achieve optimal effect.  3. If above medications can not achieve optimal BP control, will consider Clonidine.  4. Patient will  Need close follow up with BMP checks during the period of medication titration.    4. I will instruct her to buy a BP monitor and record her BP at least daily at home ,and follow up with me in one week.  5. She will need to bring all of her medications bottle with her.

## 2011-09-20 LAB — URINALYSIS, ROUTINE W REFLEX MICROSCOPIC
Bilirubin Urine: NEGATIVE
Glucose, UA: NEGATIVE mg/dL
Hgb urine dipstick: NEGATIVE
Ketones, ur: NEGATIVE mg/dL
Nitrite: NEGATIVE
Protein, ur: NEGATIVE mg/dL
Specific Gravity, Urine: 1.012 (ref 1.005–1.030)
Urobilinogen, UA: 0.2 mg/dL (ref 0.0–1.0)
pH: 6 (ref 5.0–8.0)

## 2011-09-20 LAB — URINALYSIS, MICROSCOPIC ONLY
Bacteria, UA: NONE SEEN
Casts: NONE SEEN
Crystals: NONE SEEN
Squamous Epithelial / LPF: NONE SEEN

## 2011-10-28 ENCOUNTER — Ambulatory Visit (INDEPENDENT_AMBULATORY_CARE_PROVIDER_SITE_OTHER): Payer: Self-pay | Admitting: Cardiovascular Disease

## 2011-10-28 ENCOUNTER — Encounter: Payer: Self-pay | Admitting: Cardiovascular Disease

## 2011-10-28 VITALS — BP 103/70 | HR 67 | Ht 64.5 in | Wt 211.1 lb

## 2011-10-28 DIAGNOSIS — I10 Essential (primary) hypertension: Secondary | ICD-10-CM

## 2011-10-28 DIAGNOSIS — I2510 Atherosclerotic heart disease of native coronary artery without angina pectoris: Secondary | ICD-10-CM

## 2011-10-28 NOTE — Progress Notes (Signed)
HPI:  66 year-old woman presenting for followup evaluation. She has hypertension, renal atherosclerosis, and coronary artery disease status post PCI of the left circumflex and known chronic occlusion of the right coronary artery.  Overall she is doing well. She denies chest pain, chest pressure, edema, palpitations, lightheadedness, or syncope. She has stable exertional dyspnea with moderate level activity. She has no dyspnea with her normal activities.  Outpatient Encounter Prescriptions as of 10/28/2011  Medication Sig Dispense Refill  . amLODipine (NORVASC) 10 MG tablet Take 1 tablet (10 mg total) by mouth daily.  30 tablet  12  . aspirin 325 MG tablet Take 325 mg by mouth daily.        . carvedilol (COREG) 25 MG tablet Take 25 mg by mouth 2 (two) times daily with a meal.      . FLUoxetine (PROZAC) 10 MG capsule Take 10 mg by mouth daily.      Marland Kitchen lisinopril-hydrochlorothiazide (PRINZIDE) 20-12.5 MG per tablet Take 1 tablet by mouth 2 (two) times daily.  60 tablet  11  . nitroGLYCERIN (NITROSTAT) 0.4 MG SL tablet Place 0.4 mg under the tongue every 5 (five) minutes as needed. Chest pain      . rosuvastatin (CRESTOR) 10 MG tablet Take 1 tablet (10 mg total) by mouth at bedtime.  30 tablet  10  . levothyroxine (SYNTHROID, LEVOTHROID) 125 MCG tablet Take 1 tablet (125 mcg total) by mouth daily.  30 tablet  0  . DISCONTD: baclofen (LIORESAL) 10 MG tablet Take 10 mg by mouth 3 (three) times daily.         No Known Allergies  Past Medical History  Diagnosis Date  . Hypertension   . Cervical polyp   . Vaginal Discharge   . Neck mass   . Cervical lymphadenopathy   . Urinary tract infection   . Renal artery stenosis   . Cerebrovascular accident   . Hypothyroidism   . Hypertension   . Hyperlipidemia   . Coronary artery disease     Prior stent of the lCX with chronic total occlusion of a small RCA. Last cath in 2004 showing patent stent, nonobstructive disease and total RCA. EF is 65 to  70%. She has been managed medically.   . Renal artery stenosis     ROS: Negative except as per HPI  BP 103/70  Pulse 67  Ht 5' 4.5" (1.638 m)  Wt 95.763 kg (211 lb 1.9 oz)  BMI 35.68 kg/m2  PHYSICAL EXAM: Pt is alert and oriented, NAD HEENT: normal Neck: JVP - normal, carotids 2+= without bruits Lungs: CTA bilaterally CV: RRR without murmur or gallop Abd: soft, NT, Positive BS, no hepatomegaly Ext: no C/C/E, distal pulses intact and equal Skin: warm/dry no rash  EKG:  Sinus rhythm 59 beats per minute, T-wave abnormality consider lateral ischemia. No significant change from previous tracing 12/21/2010.  ASSESSMENT AND PLAN: 1. Coronary atherosclerosis, native vessel. She remains on aspirin for antiplatelet therapy, beta blocker, and statin drug. She will continue her same medications. I would like to see her back in 12 months. She's having no anginal symptoms.  2. Essential hypertension. This is by far the best blood pressure reading I have seen on Ms. Bracamonte. She will continue her same medications. She reports compliance with her antihypertensive drugs.  3. Hyperlipidemia. Lipids from earlier this year showed cholesterol 152, LDL 102, and HDL 30. She remains on Crestor 10 mg daily.  Tonny Bollman 10/28/2011 4:21 PM

## 2011-10-28 NOTE — Patient Instructions (Addendum)
Your physician wants you to follow-up in: 1 YEAR.  You will receive a reminder letter in the mail two months in advance. If you don't receive a letter, please call our office to schedule the follow-up appointment.  Your physician recommends that you continue on your current medications as directed. Please refer to the Current Medication list given to you today.  

## 2011-11-01 ENCOUNTER — Encounter: Payer: Self-pay | Admitting: Internal Medicine

## 2011-11-12 ENCOUNTER — Other Ambulatory Visit: Payer: Self-pay | Admitting: Cardiovascular Disease

## 2011-11-13 NOTE — Telephone Encounter (Signed)
..   Requested Prescriptions   Pending Prescriptions Disp Refills  . amLODipine (NORVASC) 10 MG tablet [Pharmacy Med Name: AMLODIPINE 10MG      TAB] 30 tablet 6    Sig: TAKE ONE TABLET BY MOUTH EVERY DAY

## 2011-12-10 ENCOUNTER — Encounter: Payer: Self-pay | Admitting: Radiation Oncology

## 2012-01-16 ENCOUNTER — Ambulatory Visit: Payer: Self-pay | Admitting: Radiation Oncology

## 2012-01-22 ENCOUNTER — Encounter: Payer: Self-pay | Admitting: Radiation Oncology

## 2012-01-22 ENCOUNTER — Ambulatory Visit (INDEPENDENT_AMBULATORY_CARE_PROVIDER_SITE_OTHER): Payer: Self-pay | Admitting: Radiation Oncology

## 2012-01-22 VITALS — BP 142/80 | HR 60 | Temp 97.1°F | Ht 65.0 in | Wt 214.4 lb

## 2012-01-22 DIAGNOSIS — Z79899 Other long term (current) drug therapy: Secondary | ICD-10-CM

## 2012-01-22 DIAGNOSIS — I10 Essential (primary) hypertension: Secondary | ICD-10-CM

## 2012-01-22 DIAGNOSIS — J069 Acute upper respiratory infection, unspecified: Secondary | ICD-10-CM

## 2012-01-22 DIAGNOSIS — E119 Type 2 diabetes mellitus without complications: Secondary | ICD-10-CM

## 2012-01-22 DIAGNOSIS — E039 Hypothyroidism, unspecified: Secondary | ICD-10-CM

## 2012-01-22 LAB — TSH: TSH: 3.77 u[IU]/mL (ref 0.350–4.500)

## 2012-01-22 LAB — POCT GLYCOSYLATED HEMOGLOBIN (HGB A1C): Hemoglobin A1C: 6.8

## 2012-01-22 LAB — GLUCOSE, CAPILLARY: Glucose-Capillary: 131 mg/dL — ABNORMAL HIGH (ref 70–99)

## 2012-01-22 MED ORDER — LEVOTHYROXINE SODIUM 125 MCG PO TABS: 125.0000 ug | ORAL_TABLET | Freq: Every day | ORAL | Status: DC

## 2012-01-22 MED ORDER — METFORMIN HCL 500 MG PO TABS: 500.0000 mg | ORAL_TABLET | Freq: Every day | ORAL | Status: DC

## 2012-01-22 NOTE — Assessment & Plan Note (Addendum)
   Lab Results  Component Value Date   HGBA1C 6.8 01/22/2012   HGBA1C 6.5 09/19/2011   HGBA1C 6.5 04/16/2011     Assessment:  Diabetes control: good control (HgbA1C at goal)  Progress toward A1C goal:     Comments: Patient's A1c has been 6.5 for approximately the last year, however it has since increased as of today's visit, and oral anti-hypoglycemic therapy will be initiated at this time.   Plan:  Medications:  Metformin 500 mg every morning  Other plans: repeat A1c in 3 months and make any necessary changes to anti-hyperglycemic regimen at that time

## 2012-01-22 NOTE — Assessment & Plan Note (Addendum)
Patient's signs and symptoms today (weight gain, fatigue, bradycardia, etc.) are consistent with hypothyroidism. Patient admits to noncompliance with her Synthroid therapy. Suspect TSH will be elevated, however no changes in Synthroid dose are warranted at this time as patient has been noncompliant. -Check TSH -Synthroid 125 mcg every morning before breakfast -If TSH elevated, Followup in one month (otherwise followup in 3 months)

## 2012-01-22 NOTE — Assessment & Plan Note (Signed)
BP Readings from Last 3 Encounters:  01/22/12 142/80  10/28/11 103/70  09/19/11 179/93    Lab Results  Component Value Date   NA 141 04/22/2011   K 3.7 04/22/2011   CREATININE 0.99 04/22/2011    Assessment:  Blood pressure control: controlled  Progress toward BP goal:  at goal  Comments: approximately at goal, and although borderline-high no changes in medications are warranted today as patient is acutely ill with viral URI. Suspect patient's lack of compliance with synthroid may also be contributory.  Plan:  Medications:  continue current medications  Educational resources provided: brochure  Self management tools provided: home blood pressure logbook

## 2012-01-22 NOTE — Progress Notes (Signed)
   Patient: [name]   MRN: [id number]  DOB: March 01, 1945  PCP: Elfredia Nevins, MD   Subjective:    HPI: [name] is a 66 y.o. female with a PMHx of hypothyroidism, type 2 diabetes mellitus, who presents to clinic today for routine followup. The patient complains of recent weight gain, fatigue, dry skin, and mild depressive symptoms. She admits to not regularly taking her Synthroid, and expresses some confusion as to what this medication is for. She states she's been taking all of her other medications as prescribed. Her only other complaints today are of nasal congestion and rhinorrhea for the last few days. She denies fever or chills. Denies chest pain or shortness of breath. Denies any other complaints, states she's otherwise been feeling well.  Review of Systems: Per HPI.   Current Outpatient Medications: Current Outpatient Prescriptions  Medication Sig Dispense Refill  . amLODipine (NORVASC) 10 MG tablet TAKE ONE TABLET BY MOUTH EVERY DAY  30 tablet  6  . aspirin 325 MG tablet Take 325 mg by mouth daily.        . carvedilol (COREG) 25 MG tablet Take 25 mg by mouth 2 (two) times daily with a meal.      . FLUoxetine (PROZAC) 10 MG capsule Take 10 mg by mouth daily.      Marland Kitchen levothyroxine (SYNTHROID, LEVOTHROID) 125 MCG tablet Take 1 tablet (125 mcg total) by mouth daily.  30 tablet  0  . lisinopril-hydrochlorothiazide (PRINZIDE) 20-12.5 MG per tablet Take 1 tablet by mouth 2 (two) times daily.  60 tablet  11        . nitroGLYCERIN (NITROSTAT) 0.4 MG SL tablet Place 0.4 mg under the tongue every 5 (five) minutes as needed. Chest pain      . rosuvastatin (CRESTOR) 10 MG tablet Take 1 tablet (10 mg total) by mouth at bedtime.  30 tablet  10     No Known Allergies  Past Medical History  Diagnosis Date  . Hypertension   . Cervical polyp   . Vaginal discharge   . Neck mass   . Cervical lymphadenopathy   . Urinary tract infection   . Renal artery stenosis   . Cerebrovascular  accident   . Hypothyroidism   . Hypertension   . Hyperlipidemia   . Coronary artery disease     Prior stent of the lCX with chronic total occlusion of a small RCA. Last cath in 2004 showing patent stent, nonobstructive disease and total RCA. EF is 65 to 70%. She has been managed medically.   . Renal artery stenosis     Past Surgical History  Procedure Date  . Cardiac catheterization 2005/2006/2007     Objective:    Physical Exam: Filed Vitals:   01/22/12 1023  BP: 142/80  Pulse: 60  Temp: 97.1 F (36.2 C)      General: Vital signs reviewed and noted. Well-developed, well-nourished, in no acute distress; alert, appropriate and cooperative throughout examination.  Head: Normocephalic, atraumatic.   Lungs:  Normal respiratory effort. Clear to auscultation BL without crackles or wheezes.  Heart: Slightly bradycardic, ~50-55bpm. S1 and S2 normal without gallop, rubs.  Abdomen:  BS normoactive. Soft, Nondistended, non-tender.  No masses or organomegaly.  Extremities: No pretibial edema. Dry skin on bilateral LE.     Assessment/ Plan:

## 2012-01-22 NOTE — Assessment & Plan Note (Signed)
Patient complaints regarding this issue are minimal. Denies fever or chills. She was instructed to return to clinic if her symptoms worsen, and otherwise to address the issue with supportive care.

## 2012-01-22 NOTE — Patient Instructions (Addendum)
.Levothyroxine tablets What is this medicine? LEVOTHYROXINE (lee voe thye ROX een) is a thyroid hormone. This medicine can improve symptoms of thyroid deficiency such as slow speech, lack of energy, weight gain, hair loss, dry skin, and feeling cold. It also helps to treat goiter (an enlarged thyroid gland). It is also used to treat some kinds of thyroid cancer along with surgery and other medicines. This medicine may be used for other purposes; ask your health care provider or pharmacist if you have questions. What should I tell my health care provider before I take this medicine? They need to know if you have any of these conditions: -angina -blood clotting problems -diabetes -dieting or on a weight loss program -fertility problems -heart disease -high levels of thyroid hormone -pituitary gland problem -previous heart attack -an unusual or allergic reaction to levothyroxine, thyroid hormones, other medicines, foods, dyes, or preservatives -pregnant or trying to get pregnant -breast-feeding How should I use this medicine? Take this medicine by mouth with plenty of water. It is best to take on an empty stomach, at least 30 minutes before or 2 hours after food. Follow the directions on the prescription label. Take at the same time each day. Do not take your medicine more often than directed. Contact your pediatrician regarding the use of this medicine in children. While this drug may be prescribed for children and infants as young as a few days of age for selected conditions, precautions do apply. For infants, you may crush the tablet and place in a small amount of (5-10 ml or 1 to 2 teaspoonfuls) of water, breast milk, or non-soy based infant formula. Do not mix with soy-based infant formula. Give as directed. Overdosage: If you think you have taken too much of this medicine contact a poison control center or emergency room at once. NOTE: This medicine is only for you. Do not share this  medicine with others. What if I miss a dose? If you miss a dose, take it as soon as you can. If it is almost time for your next dose, take only that dose. Do not take double or extra doses. What may interact with this medicine? -amiodarone -antacids -anti-thyroid medicines -calcium supplements -carbamazepine -cholestyramine -colestipol -digoxin -female hormones, including contraceptive or birth control pills -iron supplements -ketamine -liquid nutrition products like Ensure -medicines for colds and breathing difficulties -medicines for diabetes -medicines for mental depression -medicines or herbals used to decrease weight or appetite -phenobarbital or other barbiturate medications -phenytoin -prednisone or other corticosteroids -rifabutin -rifampin -soy isoflavones -sucralfate -theophylline -warfarin This list may not describe all possible interactions. Give your health care provider a list of all the medicines, herbs, non-prescription drugs, or dietary supplements you use. Also tell them if you smoke, drink alcohol, or use illegal drugs. Some items may interact with your medicine. What should I watch for while using this medicine? Be sure to take this medicine with plenty of fluids. Some tablets may cause choking, gagging, or difficulty swallowing from the tablet getting stuck in your throat. Most of these problems disappear if the medicine is taken with the right amount of water or other fluids. Do not switch brands of this medicine unless your health care professional agrees with the change. Ask questions if you are uncertain. You will need regular exams and occasional blood tests to check the response to treatment. If you are receiving this medicine for an underactive thyroid, it may be several weeks before you notice an improvement. Check with your doctor or  health care professional if your symptoms do not improve. It may be necessary for you to take this medicine for the rest  of your life. Do not stop using this medicine unless your doctor or health care professional advises you to. This medicine can affect blood sugar levels. If you have diabetes, check your blood sugar as directed. You may lose some of your hair when you first start treatment. With time, this usually corrects itself. If you are going to have surgery, tell your doctor or health care professional that you are taking this medicine. What side effects may I notice from receiving this medicine? Side effects that you should report to your doctor or health care professional as soon as possible: -allergic reactions like skin rash, itching or hives, swelling of the face, lips, or tongue -chest pain -excessive sweating or intolerance to heat -fast or irregular heartbeat -nervousness -skin rash or hives -swelling of ankles, feet, or legs -tremors Side effects that usually do not require medical attention (report to your doctor or health care professional if they continue or are bothersome): -changes in appetite -changes in menstrual periods -diarrhea -hair loss -headache -trouble sleeping -weight loss This list may not describe all possible side effects. Call your doctor for medical advice about side effects. You may report side effects to FDA at 1-800-FDA-1088. Where should I keep my medicine? Keep out of the reach of children. Store at room temperature between 15 and 30 degrees C (59 and 86 degrees F). Protect from light and moisture. Keep container tightly closed. Throw away any unused medicine after the expiration date. NOTE: This sheet is a summary. It may not cover all possible information. If you have questions about this medicine, talk to your doctor, pharmacist, or health care provider.  2013, [employer] Standard. (04/29/2008 2:28:07 PM)  Hypothyroidism The thyroid is a large gland located in the lower front of your neck. The thyroid gland helps control metabolism. Metabolism is how your  body handles food. It controls metabolism with the hormone thyroxine. When this gland is underactive (hypothyroid), it produces too little hormone.  CAUSES These include:   Absence or destruction of thyroid tissue.  Goiter due to iodine deficiency.  Goiter due to medications.  Congenital defects (since birth).  Problems with the pituitary. This causes a lack of TSH (thyroid stimulating hormone). This hormone tells the thyroid to turn out more hormone. SYMPTOMS  Lethargy (feeling as though you have no energy)  Cold intolerance  Weight gain (in spite of normal food intake)  Dry skin  Coarse hair  Menstrual irregularity (if severe, may lead to infertility)  Slowing of thought processes Cardiac problems are also caused by insufficient amounts of thyroid hormone. Hypothyroidism in the newborn is cretinism, and is an extreme form. It is important that this form be treated adequately and immediately or it will lead rapidly to retarded physical and mental development. DIAGNOSIS  To prove hypothyroidism, your caregiver may do blood tests and ultrasound tests. Sometimes the signs are hidden. It may be necessary for your caregiver to watch this illness with blood tests either before or after diagnosis and treatment. TREATMENT  Low levels of thyroid hormone are increased by using synthetic thyroid hormone. This is a safe, effective treatment. It usually takes about four weeks to gain the full effects of the medication. After you have the full effect of the medication, it will generally take another four weeks for problems to leave. Your caregiver may start you on low doses. If you  have had heart problems the dose may be gradually increased. It is generally not an emergency to get rapidly to normal. HOME CARE INSTRUCTIONS   Take your medications as your caregiver suggests. Let your caregiver know of any medications you are taking or start taking. Your caregiver will help you with dosage  schedules.  As your condition improves, your dosage needs may increase. It will be necessary to have continuing blood tests as suggested by your caregiver.  Report all suspected medication side effects to your caregiver. SEEK MEDICAL CARE IF: Seek medical care if you develop:  Sweating.  Tremulousness (tremors).  Anxiety.  Rapid weight loss.  Heat intolerance.  Emotional swings.  Diarrhea.  Weakness. SEEK IMMEDIATE MEDICAL CARE IF:  You develop chest pain, an irregular heart beat (palpitations), or a rapid heart beat. MAKE SURE YOU:   Understand these instructions.  Will watch your condition.  Will get help right away if you are not doing well or get worse. Document Released: 01/21/2005 Document Revised: 04/15/2011 Document Reviewed: 09/11/2007 [facility] Patient Information 2013 [place], [place].

## 2012-03-08 ENCOUNTER — Other Ambulatory Visit: Payer: Self-pay | Admitting: Cardiovascular Disease

## 2012-04-08 ENCOUNTER — Telehealth: Payer: Self-pay | Admitting: *Deleted

## 2012-04-08 NOTE — Telephone Encounter (Signed)
Message from [facility] asking for authorization to change from Mylan ( generic for levothyroxin ) to Sandoz.  This med is a Narrow Therapeutic Index drug so needs your approval. You will also need to inform the patient of the change.

## 2012-04-15 NOTE — Telephone Encounter (Signed)
I authorize this change. Spoke with pt by telephone this AM and informed her of this change as well.

## 2012-05-16 ENCOUNTER — Other Ambulatory Visit: Payer: Self-pay | Admitting: Internal Medicine

## 2012-05-28 ENCOUNTER — Ambulatory Visit: Payer: Self-pay | Admitting: Radiation Oncology

## 2012-06-02 ENCOUNTER — Ambulatory Visit (INDEPENDENT_AMBULATORY_CARE_PROVIDER_SITE_OTHER): Payer: No Typology Code available for payment source | Admitting: Radiation Oncology

## 2012-06-02 ENCOUNTER — Encounter: Payer: Self-pay | Admitting: Radiation Oncology

## 2012-06-02 VITALS — BP 136/83 | HR 56 | Temp 97.6°F | Ht 65.5 in | Wt 214.0 lb

## 2012-06-02 DIAGNOSIS — I10 Essential (primary) hypertension: Secondary | ICD-10-CM

## 2012-06-02 DIAGNOSIS — E039 Hypothyroidism, unspecified: Secondary | ICD-10-CM

## 2012-06-02 DIAGNOSIS — E785 Hyperlipidemia, unspecified: Secondary | ICD-10-CM

## 2012-06-02 DIAGNOSIS — E119 Type 2 diabetes mellitus without complications: Secondary | ICD-10-CM

## 2012-06-02 LAB — GLUCOSE, CAPILLARY: Glucose-Capillary: 124 mg/dL — ABNORMAL HIGH (ref 70–99)

## 2012-06-02 LAB — BASIC METABOLIC PANEL
BUN: 8 mg/dL (ref 6–23)
CO2: 29 mEq/L (ref 19–32)
Calcium: 9.1 mg/dL (ref 8.4–10.5)
Chloride: 104 mEq/L (ref 96–112)
Creat: 0.73 mg/dL (ref 0.50–1.10)
Glucose, Bld: 118 mg/dL — ABNORMAL HIGH (ref 70–99)
Potassium: 3.6 mEq/L (ref 3.5–5.3)
Sodium: 141 mEq/L (ref 135–145)

## 2012-06-02 LAB — LIPID PANEL
Cholesterol: 181 mg/dL (ref 0–200)
HDL: 31 mg/dL — ABNORMAL LOW (ref >39)
LDL Cholesterol: 107 mg/dL — ABNORMAL HIGH (ref 0–99)
Total CHOL/HDL Ratio: 5.8 Ratio
Triglycerides: 217 mg/dL — ABNORMAL HIGH (ref <150)
VLDL: 43 mg/dL — ABNORMAL HIGH (ref 0–40)

## 2012-06-02 LAB — POCT GLYCOSYLATED HEMOGLOBIN (HGB A1C): Hemoglobin A1C: 6.4

## 2012-06-02 MED ORDER — CARVEDILOL 25 MG PO TABS: 12.5000 mg | ORAL_TABLET | Freq: Two times a day (BID) | ORAL | Status: DC

## 2012-06-02 MED ORDER — LISINOPRIL-HYDROCHLOROTHIAZIDE 20-12.5 MG PO TABS: 1.0000 | ORAL_TABLET | Freq: Two times a day (BID) | ORAL | Status: DC

## 2012-06-02 NOTE — Patient Instructions (Addendum)
Begin taking your new dose of coreg. Continue all other medications as previously prescribed.   Have a great day. We will have you return in 6 months for follow-up.

## 2012-06-02 NOTE — Assessment & Plan Note (Signed)
Patient has not been regularly taking a statin medication. Checking lipid panel today.

## 2012-06-02 NOTE — Progress Notes (Signed)
Subjective:    [name], female    DOB: 1945/04/16, 67 y.o.   MRN: [id number]  HPI Patient is a 67 year old woman with PMH significant for type 2 diabetes mellitus, hypertension, hypothyroidism, who presents to clinic today for followup on her diabetes mellitus after starting low-dose metformin on her previous visit.   T2DM: No symptoms of hypoglycemia after starting metformin therapy. Patient denies any abdominal pain or other side effects.  HTN: Patient states that she ran out of her lisinopril-HCTZ approximately one month ago, but has been taking her Norvasc and Coreg daily as prescribed.  Hypothyroidism: Patient has been fully compliant with Synthroid therapy. Patient admits to continued fatigue, however denies any other symptoms concerning for depression.  No other symptoms concerning for hyper/hypothyroidism.  Review of Systems  Constitutional: Positive for fatigue. Negative for fever, chills, activity change and appetite change.  HENT: Negative.   Respiratory: Negative for cough and shortness of breath.   Cardiovascular: Positive for leg swelling. Negative for chest pain.  Gastrointestinal: Negative for nausea, vomiting, abdominal pain and diarrhea.  Endocrine: Negative.   Genitourinary: Negative for dysuria and hematuria.  Musculoskeletal: Negative.   Skin: Negative.   Allergic/Immunologic: Negative.   Neurological: Negative.   Hematological: Negative.   Psychiatric/Behavioral: Negative for suicidal ideas, sleep disturbance and dysphoric mood.  All other systems reviewed and are negative.   Current  is aOutpatient Medications: Current Outpatient Prescriptions  Medication Sig Dispense Refill  . amLODipine (NORVASC) 10 MG tablet TAKE ONE TABLET BY MOUTH EVERY DAY  30 tablet  6  . aspirin 325 MG tablet Take 325 mg by mouth daily.        . carvedilol (COREG) 25 MG tablet Take 25 mg by mouth 2 (two) times daily with a meal.      . carvedilol (COREG) 25 MG  tablet TAKE ONE TABLET BY MOUTH TWICE DAILY WITH MEALS  60 tablet  11  . FLUoxetine (PROZAC) 10 MG capsule Take 10 mg by mouth daily.      Marland Kitchen levothyroxine (SYNTHROID, LEVOTHROID) 125 MCG tablet Take 1 tablet (125 mcg total) by mouth daily.  30 tablet  5  . lisinopril-hydrochlorothiazide (PRINZIDE,ZESTORETIC) 20-12.5 MG per tablet TAKE ONE TABLET BY MOUTH TWICE DAILY  60 tablet  0  . metFORMIN (GLUCOPHAGE) 500 MG tablet Take 1 tablet (500 mg total) by mouth daily with breakfast.  60 tablet  11  . nitroGLYCERIN (NITROSTAT) 0.4 MG SL tablet Place 0.4 mg under the tongue every 5 (five) minutes as needed. Chest pain      . rosuvastatin (CRESTOR) 10 MG tablet Take 1 tablet (10 mg total) by mouth at bedtime.  30 tablet  10   No current facility-administered medications for this visit.    Allergies: No Known Allergies   Past Medical History: Past Medical History  Diagnosis Date  . Hypertension   . Cervical polyp   . Vaginal discharge   . Neck mass   . Cervical lymphadenopathy   . Urinary tract infection   . Renal artery stenosis   . Cerebrovascular accident   . Hypothyroidism   . Hypertension   . Hyperlipidemia   . Coronary artery disease     Prior stent of the lCX with chronic total occlusion of a small RCA. Last cath in 2004 showing patent stent, nonobstructive disease and total RCA. EF is 65 to 70%. She has been managed medically.   . Renal artery stenosis     Past Surgical History: Past  Surgical History  Procedure Laterality Date  . Cardiac catheterization  2005/2006/2007    Family History: Family History  Problem Relation Age of Onset  . Hypertension Mother   . Osteoarthritis Mother   . Alzheimer's disease Father   . Cancer Neg Hx   . Stroke Neg Hx     Social History: History   Social History  . Marital Status: Single    Spouse Name: N/A    Number of Children: N/A  . Years of Education: N/A   Occupational History  . Not on file.   Social History Main Topics   . Smoking status: Former Smoker    Quit date: 02/04/1993  . Smokeless tobacco: Not on file  . Alcohol Use: Yes     Comment: Wine on special occasions  . Drug Use: No  . Sexually Active: Not on file   Other Topics Concern  . Not on file   Social History Narrative  . No narrative on file     Vital Signs: Blood pressure 136/83, pulse 56, temperature 97.6 F (36.4 C), temperature source Oral, height 5' 5.5" (1.664 m), weight 214 lb (97.07 kg), SpO2 99.00%.      Objective:   Physical Exam  Constitutional: She is oriented to person, place, and time. She appears well-developed and well-nourished.  HENT:  Head: Normocephalic and atraumatic.  Eyes: Conjunctivae are normal. Pupils are equal, round, and reactive to light.  Neck: Normal range of motion. Neck supple.  Cardiovascular: Normal rate and regular rhythm.   Pulmonary/Chest: Effort normal.  Abdominal: Soft. Bowel sounds are normal.  Musculoskeletal: She exhibits edema (1+ bilateral pitting edema in lower extremities).  Neurological: She is alert and oriented to person, place, and time.  Skin: Skin is warm and dry.  Psychiatric: She has a normal mood and affect. Her behavior is normal.          Assessment & Plan:

## 2012-06-02 NOTE — Assessment & Plan Note (Signed)
Lab Results  Component Value Date   HGBA1C 6.4 06/02/2012   HGBA1C 6.8 01/22/2012   HGBA1C 6.5 09/19/2011     Assessment: Diabetes control: good control (HgbA1C at goal) Progress toward A1C goal:  at goal Comments: Well-controlled on low-dose metformin.  Plan: Medications:  continue current medications Home glucose monitoring: Frequency:   Timing:   Instruction/counseling given: discussed the need for weight loss Educational resources provided: brochure;handout Self management tools provided:   Other plans:

## 2012-06-02 NOTE — Assessment & Plan Note (Addendum)
Well controlled on Synthroid. The patient's symptoms of fatigue and bradycardia are more likely secondary to carvedilol given her normal TSH on her previous visit in 01/2012. - Continue Synthroid 125 mcg daily  Lab Results  Component Value Date   TSH 3.770 01/22/2012

## 2012-06-02 NOTE — Assessment & Plan Note (Addendum)
BP Readings from Last 3 Encounters:  06/02/12 136/83  01/22/12 142/80  10/28/11 103/70    Lab Results  Component Value Date   NA 141 04/22/2011   K 3.7 04/22/2011   CREATININE 0.99 04/22/2011    Assessment: Blood pressure control: controlled Progress toward BP goal:  at goal Comments: Patient's complaints of fatigue and bradycardia are likely secondary to carvedilol given her normal TSH and the temporal course of her complaints coinciding with carvedilol being increased to 25 mg twice a day. Depression unlikely as patient denies any other depressive symptoms. Pt's lower extremity edema has been present for approximately the same period of time that she has been noncompliant with her HCTZ, which is the likely etiology of the issue. Norvasc could be contributing, but pt has been on this med consistently for at least 2 years without previous symptoms of peripheral edema, thus it will be continued at this time.   Plan: Medications:  Decrease carvedilol to 12.5 mg twice a day. Resume lisinopril-HCTZ 20-12.5 mg. Cont norvasc 10mg  daily.  Educational resources provided:   Self management tools provided: home blood pressure logbook Other plans: Check BMP prior to having patient resume lisinopril-HCTZ.

## 2012-06-03 LAB — MICROALBUMIN / CREATININE URINE RATIO
Creatinine, Urine: 156.9 mg/dL
Microalb Creat Ratio: 11.9 mg/g (ref 0.0–30.0)
Microalb, Ur: 1.86 mg/dL (ref 0.00–1.89)

## 2012-06-08 NOTE — Progress Notes (Signed)
Case discussed with Dr. Emory McTyre  at the time of the visit, immediately after the resident saw the patient.  I reviewed the resident's history and exam and pertinent patient test results.  I agree with the assessment, diagnosis and plan of care documented in the resident's note. 

## 2012-08-13 ENCOUNTER — Other Ambulatory Visit: Payer: Self-pay

## 2012-10-06 ENCOUNTER — Ambulatory Visit: Payer: Self-pay | Admitting: Internal Medicine

## 2012-10-13 ENCOUNTER — Other Ambulatory Visit: Payer: Self-pay | Admitting: *Deleted

## 2012-10-13 DIAGNOSIS — J069 Acute upper respiratory infection, unspecified: Secondary | ICD-10-CM

## 2012-10-13 DIAGNOSIS — E039 Hypothyroidism, unspecified: Secondary | ICD-10-CM

## 2012-10-13 DIAGNOSIS — E119 Type 2 diabetes mellitus without complications: Secondary | ICD-10-CM

## 2012-10-13 DIAGNOSIS — I10 Essential (primary) hypertension: Secondary | ICD-10-CM

## 2012-10-13 MED ORDER — LEVOTHYROXINE SODIUM 125 MCG PO TABS: 125.0000 ug | ORAL_TABLET | Freq: Every day | ORAL | Status: DC

## 2012-10-13 NOTE — Telephone Encounter (Signed)
Patient has upcoming appointment on Friday 10/16/12.  I called her to see if she has enough to get to that appointment and she reports she is out.  I told her I would refill the medication for a one month supply but that I really need her to come in and see me so we can recheck her and adjust the medication as necessary.

## 2012-10-16 ENCOUNTER — Encounter: Payer: Self-pay | Admitting: Internal Medicine

## 2012-10-16 ENCOUNTER — Ambulatory Visit (INDEPENDENT_AMBULATORY_CARE_PROVIDER_SITE_OTHER): Payer: Medicare Other | Admitting: Internal Medicine

## 2012-10-16 VITALS — BP 165/90 | HR 59 | Temp 97.6°F | Ht 65.5 in | Wt 209.7 lb

## 2012-10-16 DIAGNOSIS — F32A Depression, unspecified: Secondary | ICD-10-CM

## 2012-10-16 DIAGNOSIS — I10 Essential (primary) hypertension: Secondary | ICD-10-CM

## 2012-10-16 DIAGNOSIS — E119 Type 2 diabetes mellitus without complications: Secondary | ICD-10-CM

## 2012-10-16 DIAGNOSIS — F3289 Other specified depressive episodes: Secondary | ICD-10-CM

## 2012-10-16 DIAGNOSIS — Z23 Encounter for immunization: Secondary | ICD-10-CM

## 2012-10-16 DIAGNOSIS — G4733 Obstructive sleep apnea (adult) (pediatric): Secondary | ICD-10-CM

## 2012-10-16 DIAGNOSIS — E785 Hyperlipidemia, unspecified: Secondary | ICD-10-CM

## 2012-10-16 DIAGNOSIS — Z0000 Encounter for general adult medical examination without abnormal findings: Secondary | ICD-10-CM

## 2012-10-16 DIAGNOSIS — R0683 Snoring: Secondary | ICD-10-CM | POA: Insufficient documentation

## 2012-10-16 DIAGNOSIS — E039 Hypothyroidism, unspecified: Secondary | ICD-10-CM

## 2012-10-16 DIAGNOSIS — F329 Major depressive disorder, single episode, unspecified: Secondary | ICD-10-CM

## 2012-10-16 HISTORY — DX: Obstructive sleep apnea (adult) (pediatric): G47.33

## 2012-10-16 LAB — GLUCOSE, CAPILLARY: Glucose-Capillary: 124 mg/dL — ABNORMAL HIGH (ref 70–99)

## 2012-10-16 LAB — POCT GLYCOSYLATED HEMOGLOBIN (HGB A1C): Hemoglobin A1C: 6.2

## 2012-10-16 LAB — TSH: TSH: 6.061 u[IU]/mL — ABNORMAL HIGH (ref 0.350–4.500)

## 2012-10-16 MED ORDER — FLUOXETINE HCL 10 MG PO CAPS: 10.0000 mg | ORAL_CAPSULE | Freq: Every day | ORAL | Status: DC

## 2012-10-16 MED ORDER — ROSUVASTATIN CALCIUM 10 MG PO TABS: 10.0000 mg | ORAL_TABLET | Freq: Every day | ORAL | Status: DC

## 2012-10-16 NOTE — Assessment & Plan Note (Signed)
Patient reports depression, anhendonia, lack of concentration, lack of energy, and sleep disturbances.  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  She denies any SI. Will also check TSH and refer for splint night eval of OSA.

## 2012-10-16 NOTE — Assessment & Plan Note (Addendum)
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 Addendum 10/21/12: last colonoscopy in 2006, patient needs colonoscopy and will refer to GI, on last colonoscopy Dr. Leone Payor reported she may be a candidate for CT colonoscopy. Patient has had history of neg pap smears in 2007, 2011. Is now 67 years of age.  Pap screening was discussed and patient instructed she is at a much lower risk of cervical cancer at her age with no reported history of positives, although she is a former smoker.  She declined Pap.

## 2012-10-16 NOTE — Assessment & Plan Note (Signed)
Lab Results  Component Value Date   HGBA1C 6.2 10/16/2012   HGBA1C 6.4 06/02/2012   HGBA1C 6.8 01/22/2012     Assessment: Diabetes control: good control (HgbA1C at goal) Progress toward A1C goal:  at goal Comments: Well controlled.  Plan: Medications:  continue current medications Metformin 500mg  QAM. Home glucose monitoring: Frequency:   Timing:   Instruction/counseling given: reminded to get eye exam, discussed foot care and discussed the need for weight loss Educational resources provided:   Self management tools provided:   Other plans: Patient is doing very well on Metformin, reports she has never had DM eye exam-will refer.

## 2012-10-16 NOTE — Patient Instructions (Signed)
1.  Continue to take your medications as prescribed. Remember to break the Coreg dose in half and take half in the AM and half in the PM. 2.  Start taking Prozac once a day 2.  Make sure you bring your medications and take them before your next visit, we will also review the records I requested and talk about Colonoscopy and Sleep Study.  Fluoxetine capsules or tablets (Depression/Mood Disorders) What is this medicine? FLUOXETINE (floo OX e teen) belongs to a class of drugs known as selective serotonin reuptake inhibitors (SSRIs). It helps to treat mood problems such as depression, obsessive compulsive disorder, and panic attacks. It can also treat certain eating disorders. This medicine may be used for other purposes; ask your health care provider or pharmacist if you have questions. What should I tell my health care provider before I take this medicine? They need to know if you have any of these conditions: -bipolar disorder or mania -diabetes -glaucoma -liver disease -psychosis -seizures -suicidal thoughts or history of attempted suicide -an unusual or allergic reaction to fluoxetine, other medicines, foods, dyes, or preservatives -pregnant or trying to get pregnant -breast-feeding How should I use this medicine? Take this medicine by mouth with a glass of water. Follow the directions on the prescription label. You can take this medicine with or without food. Take your medicine at regular intervals. Do not take it more often than directed. Do not stop taking this medicine suddenly except upon the advice of your doctor. Stopping this medicine too quickly may cause serious side effects or your condition may worsen. A special MedGuide will be given to you by the pharmacist with each prescription and refill. Be sure to read this information carefully each time. Talk to your pediatrician regarding the use of this medicine in children. While this drug may be prescribed for children as young as 7  years for selected conditions, precautions do apply. Overdosage: If you think you have taken too much of this medicine contact a poison control center or emergency room at once. NOTE: This medicine is only for you. Do not share this medicine with others. What if I miss a dose? If you miss a dose, skip the missed dose and go back to your regular dosing schedule. Do not take double or extra doses. What may interact with this medicine? Do not take fluoxetine with any of the following medications: -other medicines containing fluoxetine, like Sarafem or Symbyax -cisapride -linezolid -MAOIs like Carbex, Eldepryl, Marplan, Nardil, and Parnate -methylene blue (injected into a vein) -pimozide -thioridazine This medicine may also interact with the following medications: -alcohol -aspirin and aspirin-like medicines -carbamazepine -certain medicines for depression, anxiety, or psychotic disturbances -certain medicines for migraine headaches like almotriptan, eletriptan, frovatriptan, naratriptan, rizatriptan, sumatriptan, zolmitriptan -digoxin -diuretics -fentanyl -flecainide -furazolidone -isoniazid -lithium -medicines for sleep -medicines that treat or prevent blood clots like warfarin, enoxaparin, and dalteparin -NSAIDs, medicines for pain and inflammation, like ibuprofen or naproxen -phenytoin -procarbazine -propafenone -rasagiline -ritonavir -supplements like St. John's wort, kava kava, valerian -tramadol -tryptophan -vinblastine This list may not describe all possible interactions. Give your health care provider a list of all the medicines, herbs, non-prescription drugs, or dietary supplements you use. Also tell them if you smoke, drink alcohol, or use illegal drugs. Some items may interact with your medicine. What should I watch for while using this medicine? Tell your doctor if your symptoms do not get better or if they get worse. Visit your doctor or health care professional for  regular checks on your progress. Because it may take several weeks to see the full effects of this medicine, it is important to continue your treatment as prescribed by your doctor. Patients and their families should watch out for new or worsening thoughts of suicide or depression. Also watch out for sudden changes in feelings such as feeling anxious, agitated, panicky, irritable, hostile, aggressive, impulsive, severely restless, overly excited and hyperactive, or not being able to sleep. If this happens, especially at the beginning of treatment or after a change in dose, call your health care professional. Bonita Quin may get drowsy or dizzy. Do not drive, use machinery, or do anything that needs mental alertness until you know how this medicine affects you. Do not stand or sit up quickly, especially if you are an older patient. This reduces the risk of dizzy or fainting spells. Alcohol may interfere with the effect of this medicine. Avoid alcoholic drinks. Your mouth may get dry. Chewing sugarless gum or sucking hard candy, and drinking plenty of water may help. Contact your doctor if the problem does not go away or is severe. This medicine may affect blood sugar levels. If you have diabetes, check with your doctor or health care professional before you change your diet or the dose of your diabetic medicine. What side effects may I notice from receiving this medicine? Side effects that you should report to your doctor or health care professional as soon as possible: -allergic reactions like skin rash, itching or hives, swelling of the face, lips, or tongue -breathing problems -confusion -fast or irregular heart rate, palpitations -flu-like fever, chills, cough, muscle or joint aches and pains -seizures -suicidal thoughts or other mood changes -tremors -trouble sleeping -unusual bleeding or bruising -unusually tired or weak -vomiting Side effects that usually do not require medical attention (report to  your doctor or health care professional if they continue or are bothersome): -blurred vision -change in sex drive or performance -diarrhea -dry mouth -flushing -headache -increased or decreased appetite -nausea -sweating This list may not describe all possible side effects. Call your doctor for medical advice about side effects. You may report side effects to FDA at 1-800-FDA-1088. Where should I keep my medicine? Keep out of the reach of children. Store at room temperature between 15 and 30 degrees C (59 and 86 degrees F). Throw away any unused medicine after the expiration date. NOTE: This sheet is a summary. It may not cover all possible information. If you have questions about this medicine, talk to your doctor, pharmacist, or health care provider.  2013, [employer]. (06/07/2011 7:31:49 PM)

## 2012-10-16 NOTE — Assessment & Plan Note (Signed)
Patient reports fatigue, daytime somnolence, reports she is scared to drive long distances by herself.  Admits to snoring.  Reports she had a sleep study in the past but does not know results.  Records were requested from the sleep center.  Records returned after patient had left which revealed Moderate OSA.  Will refer to splint night sleep study.

## 2012-10-16 NOTE — Assessment & Plan Note (Signed)
Patient with LDL slightly above goal, has not been taking crestor regularly and prescription had expired. Will recheck in a few months and patient encouraged to take Crestor as prescribed.

## 2012-10-16 NOTE — Progress Notes (Signed)
Patient ID: BRITANEE VANBLARCOM, female   DOB: 1945/04/22, 67 y.o.   MRN: [id number]   Subjective:   Patient ID: PALMER SHOREY female   DOB: October 22, 1945 67 y.o.   MRN: [id number]  HPI: Ms.Khamora L Jolley is a 67 y.o. female with a PMH of well-controlled DM, HTN, HLD, CAD with stent placement, hypothyroidism, depression. She presents today for regular followup. DM- she does not check her blood sugars at home. She has been well controlled with metformin 500 mg every morning. She denies any symptoms of hypoglycemia. HTN-she reports that she ran out of some of her medications and requested refills but has not picked them up. She reports she only took Norvasc this morning. She denies any problems with her medications. She does not have a working blood pressure cuff at home.  Patient reports she's been taking her Synthroid as prescribed. She does admit increased fatigue, daytime somnolence, increased depression, anhedonia, lack of energy, decreased concentration, occasional insomnia. She reports she does not drive long distances for fear of falling asleep. She admits a prior sleep study but does not know the results.  She has an appointment with Dr. Excell Seltzer for followup of CAD on October 1.     Past Medical History  Diagnosis Date  . Hypertension   . Cervical polyp   . Vaginal discharge   . Neck mass   . Cervical lymphadenopathy   . Urinary tract infection   . Renal artery stenosis   . Cerebrovascular accident   . Hypothyroidism   . Hypertension   . Hyperlipidemia   . Coronary artery disease     Prior stent of the lCX with chronic total occlusion of a small RCA. Last cath in 2004 showing patent stent, nonobstructive disease and total RCA. EF is 65 to 70%. She has been managed medically.   . Renal artery stenosis    Current Outpatient Prescriptions  Medication Sig Dispense Refill  . amLODipine (NORVASC) 10 MG tablet TAKE ONE TABLET BY MOUTH EVERY DAY  30 tablet  6  . aspirin 325 MG tablet Take 325  mg by mouth daily.        Marland Kitchen levothyroxine (SYNTHROID, LEVOTHROID) 125 MCG tablet Take 1 tablet (125 mcg total) by mouth daily.  30 tablet  0  . lisinopril-hydrochlorothiazide (PRINZIDE,ZESTORETIC) 20-12.5 MG per tablet Take 1 tablet by mouth 2 (two) times daily.  60 tablet  5  . metFORMIN (GLUCOPHAGE) 500 MG tablet Take 1 tablet (500 mg total) by mouth daily with breakfast.  60 tablet  11  . carvedilol (COREG) 25 MG tablet Take 0.5 tablets (12.5 mg total) by mouth 2 (two) times daily with a meal.  60 tablet  5  . FLUoxetine (PROZAC) 10 MG capsule Take 10 mg by mouth daily.      . nitroGLYCERIN (NITROSTAT) 0.4 MG SL tablet Place 0.4 mg under the tongue every 5 (five) minutes as needed. Chest pain      . rosuvastatin (CRESTOR) 10 MG tablet Take 1 tablet (10 mg total) by mouth at bedtime.  30 tablet  10   No current facility-administered medications for this visit.   Family History  Problem Relation Age of Onset  . Hypertension Mother   . Osteoarthritis Mother   . Alzheimer's disease Father   . Cancer Neg Hx   . Stroke Neg Hx    History   Social History  . Marital Status: Single    Spouse Name: N/A    Number of Children: N/A  .  Years of Education: N/A   Social History Main Topics  . Smoking status: Former Smoker    Quit date: 02/04/1993  . Smokeless tobacco: None  . Alcohol Use: Yes     Comment: Wine on special occasions  . Drug Use: No  . Sexual Activity: None   Other Topics Concern  . None   Social History Narrative  . None   Review of Systems: Review of Systems  Constitutional: Negative for fever, chills and weight loss.  HENT: Negative for congestion.   Eyes: Negative for blurred vision.  Respiratory: Negative for cough and shortness of breath.   Cardiovascular: Negative for chest pain (has not needed NTG in over a month) and leg swelling.  Gastrointestinal: Negative for heartburn, nausea, vomiting, diarrhea and constipation.  Genitourinary: Negative for dysuria.   Musculoskeletal: Negative for myalgias and falls.  Skin: Negative for rash.  Neurological: Negative for dizziness, weakness and headaches.  Psychiatric/Behavioral: Positive for depression. Negative for suicidal ideas and memory loss. The patient has insomnia. The patient is not nervous/anxious.     Objective:  Physical Exam: Filed Vitals:   10/16/12 1029 10/16/12 1114  BP: 169/87 165/90  Pulse: 60 59  Temp: 97.6 F (36.4 C)   TempSrc: Oral   Height: 5' 5.5" (1.664 m)   Weight: 209 lb 11.2 oz (95.119 kg)   SpO2: 99%   Physical Exam  Nursing note and vitals reviewed. Constitutional: She is oriented to person, place, and time and well-developed, well-nourished, and in no distress. No distress.  HENT:  Head: Normocephalic and atraumatic.  Eyes: Conjunctivae are normal.  Cardiovascular: Normal rate, regular rhythm, normal heart sounds and intact distal pulses.   No murmur heard. Pulmonary/Chest: Effort normal and breath sounds normal. No respiratory distress. She has no wheezes. She has no rales.  Abdominal: Soft. Bowel sounds are normal. She exhibits no distension. There is no tenderness.  Musculoskeletal: She exhibits no edema.  Neurological: She is alert and oriented to person, place, and time.  Skin: Skin is warm and dry. She is not diaphoretic.  Psychiatric: Affect and judgment normal.     Assessment & Plan:   See Problem Based Assessment and Plan

## 2012-10-16 NOTE — Assessment & Plan Note (Addendum)
BP Readings from Last 3 Encounters:  10/16/12 165/90  06/02/12 136/83  01/22/12 142/80    Lab Results  Component Value Date   NA 141 06/02/2012   K 3.6 06/02/2012   CREATININE 0.73 06/02/2012    Assessment: Blood pressure control: mildly elevated Progress toward BP goal:  deteriorated Comments: Patient only took amlodipine this morning.  Of note she has history of RAS, stented in 2012.  Plan: Medications:  continue current medications Amlodipine 10mg  QD, Coreg 12.5 mg BID, Lisinopril 20-12.5mg  BID. Educational resources provided:   Self management tools provided:   Other plans: Patient has been taking Coreg as single dose, instructed to break in half and take in morning and at night.  Encouraged to buy replacement BP monitoring and check at night QOD. Has upcoming appointment with Dr. Excell Seltzer at first of the month, but will have patient return in 2-3 weeks to recheck BP, and follow up screening issues.

## 2012-10-16 NOTE — Assessment & Plan Note (Signed)
Patient reports compliance with Synthroid, also having increased fatigue, depression, and daytime somnolence.  This could be due to depression/OSA but will recheck TSH.

## 2012-10-18 NOTE — Progress Notes (Signed)
I saw and evaluated the patient.  I personally confirmed the key portions of the history and exam documented by Dr. Hoffman and I reviewed pertinent patient test results.  The assessment, diagnosis, and plan were formulated together and I agree with the documentation in the resident's note. 

## 2012-10-21 NOTE — Addendum Note (Signed)
Addended by: Carlynn Purl C on: 10/21/2012 04:41 PM   Modules accepted: Orders

## 2012-10-22 ENCOUNTER — Encounter: Payer: Self-pay | Admitting: Internal Medicine

## 2012-10-29 ENCOUNTER — Ambulatory Visit: Payer: Self-pay | Admitting: Internal Medicine

## 2012-10-30 ENCOUNTER — Ambulatory Visit: Payer: Self-pay | Admitting: Internal Medicine

## 2012-11-02 ENCOUNTER — Ambulatory Visit: Payer: Self-pay | Admitting: Internal Medicine

## 2012-11-04 ENCOUNTER — Ambulatory Visit: Payer: Self-pay | Admitting: Cardiovascular Disease

## 2012-11-08 ENCOUNTER — Other Ambulatory Visit: Payer: Self-pay | Admitting: Internal Medicine

## 2012-11-09 NOTE — Telephone Encounter (Signed)
Pt has an appt scheduled 10/23 per [facility].

## 2012-11-11 ENCOUNTER — Other Ambulatory Visit: Payer: Self-pay | Admitting: Internal Medicine

## 2012-11-12 ENCOUNTER — Other Ambulatory Visit: Payer: Self-pay | Admitting: Cardiovascular Disease

## 2012-11-12 ENCOUNTER — Telehealth: Payer: Self-pay | Admitting: *Deleted

## 2012-11-12 NOTE — Telephone Encounter (Signed)
Dr Mikey Bussing wants pt to come in to clinic to check urine spec - pt has requested Macrodantin x 2 and was denied. Talked with pt - pt is requesting NTG SL tab which Dr Excell Seltzer does Rx. Pt will call Dr Earmon Phoenix office for refills on NTG. Dr Mikey Bussing aware. Stanton Kidney Lekia Nier RN 11/12/12 3:10PM

## 2012-11-13 ENCOUNTER — Encounter (HOSPITAL_BASED_OUTPATIENT_CLINIC_OR_DEPARTMENT_OTHER): Payer: Self-pay

## 2012-11-17 ENCOUNTER — Other Ambulatory Visit: Payer: Self-pay | Admitting: Internal Medicine

## 2012-11-25 ENCOUNTER — Ambulatory Visit: Payer: Self-pay | Admitting: Internal Medicine

## 2012-11-26 ENCOUNTER — Ambulatory Visit: Payer: Self-pay | Admitting: Internal Medicine

## 2012-12-08 ENCOUNTER — Ambulatory Visit (HOSPITAL_BASED_OUTPATIENT_CLINIC_OR_DEPARTMENT_OTHER): Payer: No Typology Code available for payment source | Attending: Internal Medicine

## 2012-12-25 ENCOUNTER — Other Ambulatory Visit: Payer: Self-pay | Admitting: Internal Medicine

## 2012-12-28 ENCOUNTER — Ambulatory Visit: Payer: Self-pay | Admitting: Internal Medicine

## 2012-12-28 NOTE — Addendum Note (Signed)
Addended by: Neomia Dear on: 12/28/2012 07:13 PM   Modules accepted: Orders

## 2013-01-20 ENCOUNTER — Ambulatory Visit (INDEPENDENT_AMBULATORY_CARE_PROVIDER_SITE_OTHER): Payer: Medicare Other | Admitting: Cardiovascular Disease

## 2013-01-20 ENCOUNTER — Encounter: Payer: Self-pay | Admitting: Cardiovascular Disease

## 2013-01-20 VITALS — BP 160/80 | HR 61 | Ht 64.0 in | Wt 201.0 lb

## 2013-01-20 DIAGNOSIS — I10 Essential (primary) hypertension: Secondary | ICD-10-CM

## 2013-01-20 DIAGNOSIS — I2510 Atherosclerotic heart disease of native coronary artery without angina pectoris: Secondary | ICD-10-CM

## 2013-01-20 DIAGNOSIS — E785 Hyperlipidemia, unspecified: Secondary | ICD-10-CM

## 2013-01-20 DIAGNOSIS — R0989 Other specified symptoms and signs involving the circulatory and respiratory systems: Secondary | ICD-10-CM

## 2013-01-20 MED ORDER — ROSUVASTATIN CALCIUM 10 MG PO TABS: 10.0000 mg | ORAL_TABLET | Freq: Every day | ORAL | Status: DC

## 2013-01-20 MED ORDER — AMLODIPINE BESYLATE 10 MG PO TABS: ORAL_TABLET | ORAL | Status: DC

## 2013-01-20 NOTE — Patient Instructions (Signed)
Your physician recommends that you continue on your current medications as directed. Please refer to the Current Medication list given to you today.  Your physician has requested that you have a carotid duplex. This test is an ultrasound of the carotid arteries in your neck. It looks at blood flow through these arteries that supply the brain with blood. Allow one hour for this exam. There are no restrictions or special instructions.  Your physician wants you to follow-up in: 1 YEAR with Dr Excell Seltzer.  You will receive a reminder letter in the mail two months in advance. If you don't receive a letter, please call our office to schedule the follow-up appointment.

## 2013-01-20 NOTE — Progress Notes (Addendum)
HPI:  67 year old woman presenting for followup evaluation. She is followed for hypertension, renal artery stenosis, and coronary artery disease status post PCI left circumflex. She underwent right renal artery stenting in 2007. Her last renal arterial duplex in 2011 demonstrating continued patency of her stent site. Renal function has been normal with most recent creatinine 0.7 in April of this year.  From a symptomatic perspective she is doing fine. She's been diagnosed with type 2 diabetes. She's lost 10 pounds through diet. She's not engaged in any regular exercise. The patient denies substernal chest pain or pressure. She did have some problems with acid reflux but these have resolved. She denies shortness of breath, edema, or palpitations. She ran out of amlodipine about 2 months ago. She's been on all of her other medications.  Outpatient Encounter Prescriptions as of 01/20/2013  Medication Sig  . aspirin 325 MG tablet Take 325 mg by mouth daily.    . carvedilol (COREG) 25 MG tablet Take 0.5 tablets (12.5 mg total) by mouth 2 (two) times daily with a meal.  . FLUoxetine (PROZAC) 10 MG capsule Take 1 capsule (10 mg total) by mouth daily.  Marland Kitchen levothyroxine (SYNTHROID, LEVOTHROID) 125 MCG tablet TAKE ONE TABLET BY MOUTH ONCE DAILY  . lisinopril-hydrochlorothiazide (PRINZIDE,ZESTORETIC) 20-12.5 MG per tablet Take 1 tablet by mouth 2 (two) times daily.  . metFORMIN (GLUCOPHAGE) 500 MG tablet Take 1 tablet (500 mg total) by mouth daily with breakfast.  . NITROSTAT 0.4 MG SL tablet DISSOLVE ONE TABLET UNDER THE TONGUE EVERY 5 MINUTES AS NEEDED FOR CHEST PAIN.  DO NOT EXCEED A TOTAL OF 3 DOSES IN 15 MINUTES NOW  . amLODipine (NORVASC) 10 MG tablet TAKE ONE TABLET BY MOUTH EVERY DAY  . rosuvastatin (CRESTOR) 10 MG tablet Take 1 tablet (10 mg total) by mouth at bedtime.    No Known Allergies  Past Medical History  Diagnosis Date  . Hypertension   . Cervical polyp   . Vaginal discharge   .  Neck mass   . Cervical lymphadenopathy   . Urinary tract infection   . Renal artery stenosis   . Cerebrovascular accident   . Hypothyroidism   . Hypertension   . Hyperlipidemia   . Coronary artery disease     Prior stent of the lCX with chronic total occlusion of a small RCA. Last cath in 2004 showing patent stent, nonobstructive disease and total RCA. EF is 65 to 70%. She has been managed medically.   . Renal artery stenosis   . Tubular adenoma of rectum 10/24/04   ROS: Positive for 10 pound weight loss, otherwise Negative except as per HPI  BP 160/80  Pulse 61  Ht 5\' 4"  (1.626 m)  Wt 201 lb (91.173 kg)  BMI 34.48 kg/m2  PHYSICAL EXAM: Pt is alert and oriented, NAD HEENT: normal Neck: JVP - normal, carotids 2+= with a right carotid bruit Lungs: CTA bilaterally CV: RRR with grade 2/6 early systolic ejection murmur at the right upper sternal border Abd: soft, NT, Positive BS, no hepatomegaly Ext: no C/C/E Skin: warm/dry no rash  EKG:  Normal sinus rhythm 61 beats per minute, nonspecific T wave abnormality. Moderate voltage criteria for LVH.  ASSESSMENT AND PLAN: 1. Coronary artery disease, native vessel. The patient is stable without symptoms of angina. She will continue on her current medical program.  2. Hypertension with suboptimal control. The patient ran out of amlodipine 2 months ago. We will start her back on this medication. She  should continue on lisinopril, hydrochlorothiazide, and carvedilol.  3. Renal artery stenosis. Continue with clinical followup. Creatinine has been stable. Blood pressure control is essentially unchanged.  4. Hyperlipidemia. The patient is on Crestor 10 mg. Lipids from April 2014 were reviewed. Cholesterol is 181, triglycerides 217, HDL 31, and LDL 107. I'm  not sure that she has been compliant with her cholesterol lowering medication. Importance of medication adherence was stressed.  Overall the patient is stable from a cardiac perspective. I  encouraged her about her weight loss efforts. Stressed the fact that weight loss will help with blood pressure control, diabetes control, and hyperlipidemia. I will see her back in one year unless problems arise.  Tonny Bollman 01/20/2013 5:23 PM  Received request for surgical clearance for umbilical hernia repair. Pt just seen 3 months ago and stable from cardiac perspective without anginal symptoms. She can proceed with surgery at low risk of cardiac complications. No further CV testing indicated. Please call if any assistance needed with her perioperative cardiac management.  Tonny Bollman 05/04/2013 10:48 PM

## 2013-01-29 ENCOUNTER — Ambulatory Visit (HOSPITAL_COMMUNITY): Payer: Medicare Other | Attending: Cardiology

## 2013-01-29 ENCOUNTER — Encounter: Payer: Self-pay | Admitting: Cardiology

## 2013-01-29 DIAGNOSIS — I658 Occlusion and stenosis of other precerebral arteries: Secondary | ICD-10-CM | POA: Insufficient documentation

## 2013-01-29 DIAGNOSIS — Z87891 Personal history of nicotine dependence: Secondary | ICD-10-CM | POA: Insufficient documentation

## 2013-01-29 DIAGNOSIS — R0989 Other specified symptoms and signs involving the circulatory and respiratory systems: Secondary | ICD-10-CM | POA: Insufficient documentation

## 2013-01-29 DIAGNOSIS — I6529 Occlusion and stenosis of unspecified carotid artery: Secondary | ICD-10-CM | POA: Insufficient documentation

## 2013-01-29 DIAGNOSIS — I739 Peripheral vascular disease, unspecified: Secondary | ICD-10-CM | POA: Insufficient documentation

## 2013-01-29 DIAGNOSIS — I2510 Atherosclerotic heart disease of native coronary artery without angina pectoris: Secondary | ICD-10-CM | POA: Insufficient documentation

## 2013-01-29 DIAGNOSIS — Z8673 Personal history of transient ischemic attack (TIA), and cerebral infarction without residual deficits: Secondary | ICD-10-CM | POA: Insufficient documentation

## 2013-01-29 DIAGNOSIS — I10 Essential (primary) hypertension: Secondary | ICD-10-CM | POA: Insufficient documentation

## 2013-03-29 ENCOUNTER — Other Ambulatory Visit: Payer: Self-pay | Admitting: *Deleted

## 2013-03-29 DIAGNOSIS — E039 Hypothyroidism, unspecified: Secondary | ICD-10-CM

## 2013-03-29 DIAGNOSIS — E119 Type 2 diabetes mellitus without complications: Secondary | ICD-10-CM

## 2013-03-29 DIAGNOSIS — J069 Acute upper respiratory infection, unspecified: Secondary | ICD-10-CM

## 2013-03-29 DIAGNOSIS — I10 Essential (primary) hypertension: Secondary | ICD-10-CM

## 2013-03-29 MED ORDER — NITROGLYCERIN 0.4 MG SL SUBL: SUBLINGUAL_TABLET | SUBLINGUAL | Status: DC

## 2013-03-29 MED ORDER — METFORMIN HCL 500 MG PO TABS: 500.0000 mg | ORAL_TABLET | Freq: Every day | ORAL | Status: DC

## 2013-03-29 NOTE — Telephone Encounter (Signed)
Sorry, refill was for metformin not nitrostat. I called pharmacy and told them to keep Nitro on file but don't fill now.

## 2013-04-23 ENCOUNTER — Ambulatory Visit: Payer: Self-pay | Admitting: Internal Medicine

## 2013-04-23 ENCOUNTER — Encounter: Payer: Self-pay | Admitting: Internal Medicine

## 2013-04-23 ENCOUNTER — Ambulatory Visit (INDEPENDENT_AMBULATORY_CARE_PROVIDER_SITE_OTHER): Payer: Medicare Other | Admitting: Internal Medicine

## 2013-04-23 VITALS — BP 118/73 | HR 64 | Temp 98.2°F | Ht 65.5 in | Wt 209.5 lb

## 2013-04-23 DIAGNOSIS — I10 Essential (primary) hypertension: Secondary | ICD-10-CM

## 2013-04-23 DIAGNOSIS — Z23 Encounter for immunization: Secondary | ICD-10-CM

## 2013-04-23 DIAGNOSIS — E119 Type 2 diabetes mellitus without complications: Secondary | ICD-10-CM

## 2013-04-23 DIAGNOSIS — F3289 Other specified depressive episodes: Secondary | ICD-10-CM

## 2013-04-23 DIAGNOSIS — F329 Major depressive disorder, single episode, unspecified: Secondary | ICD-10-CM

## 2013-04-23 DIAGNOSIS — K429 Umbilical hernia without obstruction or gangrene: Secondary | ICD-10-CM

## 2013-04-23 DIAGNOSIS — G4733 Obstructive sleep apnea (adult) (pediatric): Secondary | ICD-10-CM

## 2013-04-23 DIAGNOSIS — E039 Hypothyroidism, unspecified: Secondary | ICD-10-CM

## 2013-04-23 DIAGNOSIS — F32A Depression, unspecified: Secondary | ICD-10-CM

## 2013-04-23 DIAGNOSIS — E785 Hyperlipidemia, unspecified: Secondary | ICD-10-CM

## 2013-04-23 LAB — POCT GLYCOSYLATED HEMOGLOBIN (HGB A1C): Hemoglobin A1C: 6.6

## 2013-04-23 LAB — LIPID PANEL
Cholesterol: 123 mg/dL (ref 0–200)
HDL: 28 mg/dL — ABNORMAL LOW
LDL Cholesterol: 64 mg/dL (ref 0–99)
Total CHOL/HDL Ratio: 4.4 ratio
Triglycerides: 153 mg/dL — ABNORMAL HIGH
VLDL: 31 mg/dL (ref 0–40)

## 2013-04-23 LAB — TSH: TSH: 1.848 u[IU]/mL (ref 0.350–4.500)

## 2013-04-23 LAB — GLUCOSE, CAPILLARY: Glucose-Capillary: 142 mg/dL — ABNORMAL HIGH (ref 70–99)

## 2013-04-23 NOTE — Assessment & Plan Note (Signed)
Reports compliance with synthroid 140mcg- stressed importance. TSH mildly elevated last time, still have some fatigue. -Repeat TSH if again high will increase synthroid

## 2013-04-23 NOTE — Assessment & Plan Note (Signed)
Lipid Panel     Component Value Date/Time   CHOL 181 06/02/2012 0947   TRIG 217* 06/02/2012 0947   HDL 31* 06/02/2012 0947   CHOLHDL 5.8 06/02/2012 0947   VLDL 43* 06/02/2012 0947   LDLCALC 107* 06/02/2012 0947   - Will repeat lipid panel today, if LDL above goal will increase Crestor to 20mg  daily.

## 2013-04-23 NOTE — Progress Notes (Signed)
[facility] Subjective:   [name] female   DOB: 1945-07-16 68 y.o.   MRN: [id number]  HPI: [name] is a 68 y.o. female with a PMH significant for HTN, RAS s/p stenting in 2012, T2DM, HLD, CVA, CAD.  She reports she has joined the [employer] with the silver sneakers program.  She reports she is trying to make better diet choices but also has a lot of questions about food choices for diabetes and weight loss. She has continues to take metformin 500mg  a day and does not check her blood sugars at home and does not think she has had any hypoglycemic episodes.  She does check her BP at home usually but reports her cuff recently broke and needs a new one. She has been taking her synthroid as prescribed and reports she overall feels like that is the appropriate dose.  She has not noticed any changes in her nails, no hair loss. She does admit some decreased energy and fatigue.  She notes that lately she feels that she sleeps pretty good and goes to bed every night at 9:30 and wakes at 5:30am.  She does admit some daytime fatigue.  She reports that her sleep routine has helped her sleeping patters she has removed late night TV from her habits.  She does not that she used to have an "inny bellybutton" but now it is an "outy."  She notes an occasional hard bulging that can be quite painful, she has never been seen by surgery for this issue.  She reports she has been taking Prozac PRN for depression, has had some relief but continues to report occasional depression without SI.   She was referred back to GI for possible colonoscopy, she reports she was unable to make this appointment due to illness.  Past Medical History  Diagnosis Date  . Hypertension   . Cervical polyp   . Vaginal discharge   . Neck mass   . Cervical lymphadenopathy   . Urinary tract infection   . Renal artery stenosis   . Cerebrovascular accident   . Hypothyroidism   . Hypertension   .  Hyperlipidemia   . Coronary artery disease     Prior stent of the lCX with chronic total occlusion of a small RCA. Last cath in 2004 showing patent stent, nonobstructive disease and total RCA. EF is 65 to 70%. She has been managed medically.   . Renal artery stenosis   . Tubular adenoma of rectum 10/24/04   Current Outpatient Prescriptions  Medication Sig Dispense Refill  . amLODipine (NORVASC) 10 MG tablet TAKE ONE TABLET BY MOUTH EVERY DAY  30 tablet  11  . aspirin 325 MG tablet Take 325 mg by mouth daily.        . carvedilol (COREG) 25 MG tablet Take 0.5 tablets (12.5 mg total) by mouth 2 (two) times daily with a meal.  60 tablet  5  . FLUoxetine (PROZAC) 10 MG capsule Take 1 capsule (10 mg total) by mouth daily.  30 capsule  11  . levothyroxine (SYNTHROID, LEVOTHROID) 125 MCG tablet TAKE ONE TABLET BY MOUTH ONCE DAILY  30 tablet  5  . lisinopril-hydrochlorothiazide (PRINZIDE,ZESTORETIC) 20-12.5 MG per tablet Take 1 tablet by mouth 2 (two) times daily.  60 tablet  5  . metFORMIN (GLUCOPHAGE) 500 MG tablet Take 1 tablet (500 mg total) by mouth daily with breakfast.  60 tablet  11  . nitroGLYCERIN (NITROSTAT) 0.4 MG SL  tablet DISSOLVE ONE TABLET UNDER THE TONGUE EVERY 5 MINUTES AS NEEDED FOR CHEST PAIN.  DO NOT EXCEED A TOTAL OF 3 DOSES IN 15 MINUTES NOW  25 tablet  1  . rosuvastatin (CRESTOR) 10 MG tablet Take 1 tablet (10 mg total) by mouth at bedtime.  30 tablet  11   No current facility-administered medications for this visit.   Family History  Problem Relation Age of Onset  . Hypertension Mother   . Osteoarthritis Mother   . Alzheimer's disease Father   . Cancer Neg Hx   . Stroke Neg Hx    History   Social History  . Marital Status: Single    Spouse Name: N/A    Number of Children: N/A  . Years of Education: N/A   Social History Main Topics  . Smoking status: Former Smoker    Quit date: 02/04/1993  . Smokeless tobacco: None  . Alcohol Use: Yes     Comment: Wine on  special occasions  . Drug Use: No  . Sexual Activity: None   Other Topics Concern  . None   Social History Narrative  . None   Review of Systems: Review of Systems  Constitutional: Negative for fever, chills and weight loss.  Eyes: Negative for blurred vision.  Respiratory: Negative for cough and shortness of breath.   Cardiovascular: Negative for chest pain and leg swelling.  Gastrointestinal: Positive for abdominal pain (from hernia). Negative for heartburn.  Genitourinary: Negative for dysuria.  Musculoskeletal: Negative for back pain, myalgias and neck pain.  Skin: Negative for rash.  Neurological: Negative for dizziness, weakness and headaches.  Endo/Heme/Allergies: Negative for polydipsia.  Psychiatric/Behavioral: Negative for depression.    Objective:  Physical Exam: Filed Vitals:   04/23/13 0959  BP: 118/73  Pulse: 64  Temp: 98.2 F (36.8 C)  TempSrc: Oral  Height: 5' 5.5" (1.664 m)  Weight: 209 lb 8 oz (95.029 kg)  SpO2: 98%  Physical Exam  Nursing note and vitals reviewed. Constitutional: She is oriented to person, place, and time and well-developed, well-nourished, and in no distress.  HENT:  Head: Normocephalic and atraumatic.  Neck: Normal range of motion. Neck supple. No thyromegaly present.  Cardiovascular: Normal rate, regular rhythm, normal heart sounds and intact distal pulses.   No murmur heard. Pulmonary/Chest: Effort normal and breath sounds normal.  Abdominal: Soft. Bowel sounds are normal.  Small 2-3cm umbical hernia, was able to reduce.  Neurological: She is alert and oriented to person, place, and time. She displays normal reflexes (2+ bracoradilals, 2+ patellar).    Assessment & Plan:  Case discussed with Dr. Lynnae January See Problem Based Assessment and Plan Medications Ordered No orders of the defined types were placed in this encounter.   Other Orders Orders Placed This Encounter  Procedures  . Glucose, capillary  . TSH  . Lipid  Profile  . Referral to Nutrition and Diabetes Services    Referral Priority:  Routine    Referral Type:  Consultation    Referral Reason:  Specialty Services Required    Referred to Provider:  Chauncey Reading Plyler, RD    Number of Visits Requested:  1  . Ambulatory referral to General Surgery    Referral Priority:  Routine    Referral Type:  Surgical    Referral Reason:  Specialty Services Required    Requested Specialty:  General Surgery    Number of Visits Requested:  1  . POCT HgB A1C

## 2013-04-23 NOTE — Progress Notes (Signed)
Case discussed with Dr. Hoffman at the time of the visit.  We reviewed the resident's history and exam and pertinent patient test results.  I agree with the assessment, diagnosis, and plan of care documented in the resident's note. 

## 2013-04-23 NOTE — Patient Instructions (Addendum)
Keep up the good work. We will have you make an appointment to see Butch Penny our weight loss and nutrition expert. Please make it to your appointments for Sleep Study, Gastroerentrology- Dr Carlean Purl, and with the Surgeon. Call and reschedule if you cant make it but you really need to call before hand. I will call you with the results. Remember to get your mammogram this year.  Please bring your medicines with you each time you come.   Medicines may be  Eye drops  Herbal   Vitamins  Pills  Seeing these help [place] take care of you.

## 2013-04-23 NOTE — Assessment & Plan Note (Addendum)
Patient only takin Prozac occasionally.  But reports it has helped. - Instructed that Prozac works best when taken daily.  Reports she will try to start taking daily.

## 2013-04-23 NOTE — Assessment & Plan Note (Signed)
Patient missed appointment for sleep study, new appointment made instructed she MUST call and reschedule if she cannot make it.

## 2013-04-23 NOTE — Assessment & Plan Note (Signed)
Lab Results  Component Value Date   HGBA1C 6.6 04/23/2013   HGBA1C 6.2 10/16/2012   HGBA1C 6.4 06/02/2012     Assessment: Diabetes control: good control (HgbA1C at goal) Progress toward A1C goal:  at goal Comments: interest in nutrition and weight loss  Plan: Medications:  Continue Metformin 500mg  daily Home glucose monitoring: Frequency: no home glucose monitoring Timing:   Instruction/counseling given: discussed the need for weight loss and discussed diet Educational resources provided:   Self management tools provided:   Other plans: Referral to MNT with [employer] .

## 2013-04-23 NOTE — Addendum Note (Signed)
Addended by: Ebbie Latus on: 04/23/2013 12:08 PM   Modules accepted: Orders

## 2013-04-23 NOTE — Assessment & Plan Note (Signed)
Hernia now symptomatic. -Refer to general surgery for evaluation (possible surgical evaluation 4 years ago but unsure)

## 2013-04-23 NOTE — Assessment & Plan Note (Signed)
BP Readings from Last 3 Encounters:  04/23/13 118/73  01/20/13 160/80  10/16/12 165/90    Lab Results  Component Value Date   NA 141 06/02/2012   K 3.6 06/02/2012   CREATININE 0.73 06/02/2012    Assessment: Blood pressure control: controlled Progress toward BP goal:  at goal Comments: now exercising with silver sneakers  Plan: Medications:  Lisinopril-HCTZ 20-12.5 BID, Amlodipine 10mg  daily, Coreg 12.5mg  BID Educational resources provided:   Self management tools provided:   Other plans: MNT with [employer] .  Sleep study for sleep apnea.

## 2013-04-23 NOTE — Progress Notes (Signed)
Appointments for Sleep Study and GI given to pt.

## 2013-04-26 ENCOUNTER — Encounter: Payer: Self-pay | Admitting: Internal Medicine

## 2013-04-28 ENCOUNTER — Telehealth: Payer: Self-pay | Admitting: *Deleted

## 2013-04-29 NOTE — Progress Notes (Signed)
Case discussed with Dr. Hoffman at the time of the visit.  We reviewed the resident's history and exam and pertinent patient test results.  I agree with the assessment, diagnosis, and plan of care documented in the resident's note. 

## 2013-05-04 ENCOUNTER — Other Ambulatory Visit (INDEPENDENT_AMBULATORY_CARE_PROVIDER_SITE_OTHER): Payer: Self-pay | Admitting: Surgery

## 2013-05-04 ENCOUNTER — Encounter (INDEPENDENT_AMBULATORY_CARE_PROVIDER_SITE_OTHER): Payer: Self-pay | Admitting: Surgery

## 2013-05-04 ENCOUNTER — Ambulatory Visit (INDEPENDENT_AMBULATORY_CARE_PROVIDER_SITE_OTHER): Payer: Medicare Other | Admitting: Surgery

## 2013-05-04 ENCOUNTER — Encounter (INDEPENDENT_AMBULATORY_CARE_PROVIDER_SITE_OTHER): Payer: Self-pay | Admitting: General Surgery

## 2013-05-04 VITALS — BP 132/80 | HR 75 | Temp 96.8°F | Resp 16 | Ht 64.0 in | Wt 206.4 lb

## 2013-05-04 DIAGNOSIS — K429 Umbilical hernia without obstruction or gangrene: Secondary | ICD-10-CM

## 2013-05-04 DIAGNOSIS — K469 Unspecified abdominal hernia without obstruction or gangrene: Secondary | ICD-10-CM

## 2013-05-04 NOTE — Progress Notes (Signed)
[name], female   DOB: 1945/03/19, 68 y.o.   MRN: [id number]  Chief Complaint  Patient presents with  . Umbilical Hernia    new pt    HPI [name] is a 68 y.o. female.  Referred by Dr. Johnnette Gourd for evaluation of umbilical hernia  HPI This is a 68 year old female who presents with several years of an enlarging bulge at her umbilicus. This has become fairly large and uncomfortable. He remains reducible. She denies any obstructive symptoms. She presents now for surgical evaluation.  The patient has coronary stents in place and is followed by Dr. Sherren Mocha of cardiology. Past Medical History  Diagnosis Date  . Hypertension   . Cervical polyp   . Vaginal discharge   . Neck mass   . Cervical lymphadenopathy   . Urinary tract infection   . Renal artery stenosis   . Cerebrovascular accident   . Hypothyroidism   . Hypertension   . Hyperlipidemia   . Coronary artery disease     Prior stent of the lCX with chronic total occlusion of a small RCA. Last cath in 2004 showing patent stent, nonobstructive disease and total RCA. EF is 65 to 70%. She has been managed medically.   . Renal artery stenosis   . Tubular adenoma of rectum 10/24/04    Past Surgical History  Procedure Laterality Date  . Cardiac catheterization  2005/2006/2007  . Colonoscopy  10/2004    Family History  Problem Relation Age of Onset  . Hypertension Mother   . Osteoarthritis Mother   . Alzheimer's disease Father   . Cancer Neg Hx   . Stroke Neg Hx     Social History History  Substance Use Topics  . Smoking status: Former Smoker    Quit date: 02/04/1993  . Smokeless tobacco: Not on file  . Alcohol Use: Yes     Comment: Wine on special occasions    No Known Allergies  Current Outpatient Prescriptions  Medication Sig Dispense Refill  . amLODipine (NORVASC) 10 MG tablet TAKE ONE TABLET BY MOUTH EVERY DAY  30 tablet  11  . aspirin 325 MG tablet Take 325 mg by mouth daily.        .  carvedilol (COREG) 25 MG tablet Take 0.5 tablets (12.5 mg total) by mouth 2 (two) times daily with a meal.  60 tablet  5  . FLUoxetine (PROZAC) 10 MG capsule Take 1 capsule (10 mg total) by mouth daily.  30 capsule  11  . levothyroxine (SYNTHROID, LEVOTHROID) 125 MCG tablet TAKE ONE TABLET BY MOUTH ONCE DAILY  30 tablet  5  . lisinopril-hydrochlorothiazide (PRINZIDE,ZESTORETIC) 20-12.5 MG per tablet Take 1 tablet by mouth 2 (two) times daily.  60 tablet  5  . metFORMIN (GLUCOPHAGE) 500 MG tablet Take 1 tablet (500 mg total) by mouth daily with breakfast.  60 tablet  11  . nitroGLYCERIN (NITROSTAT) 0.4 MG SL tablet DISSOLVE ONE TABLET UNDER THE TONGUE EVERY 5 MINUTES AS NEEDED FOR CHEST PAIN.  DO NOT EXCEED A TOTAL OF 3 DOSES IN 15 MINUTES NOW  25 tablet  1  . rosuvastatin (CRESTOR) 10 MG tablet Take 1 tablet (10 mg total) by mouth at bedtime.  30 tablet  11   No current facility-administered medications for this visit.    Review of Systems Review of Systems  Constitutional: Negative for fever, chills and unexpected weight change.  HENT: Negative for congestion, hearing loss, sore throat, trouble swallowing and voice change.  Eyes: Negative for visual disturbance.  Respiratory: Negative for cough and wheezing.   Cardiovascular: Negative for chest pain, palpitations and leg swelling.  Gastrointestinal: Positive for abdominal pain and abdominal distention. Negative for nausea, vomiting, diarrhea, constipation, blood in stool and anal bleeding.  Genitourinary: Negative for hematuria, vaginal bleeding and difficulty urinating.  Musculoskeletal: Negative for arthralgias.  Skin: Negative for rash and wound.  Neurological: Negative for seizures, syncope and headaches.  Hematological: Negative for adenopathy. Does not bruise/bleed easily.  Psychiatric/Behavioral: Negative for confusion.    Blood pressure 132/80, pulse 75, temperature 96.8 F (36 C), temperature source Oral, resp. rate 16, height  5\' 4"  (1.626 m), weight 206 lb 6.4 oz (93.622 kg).  Physical Exam Physical Exam WDWN in NAD HEENT:  EOMI, sclera anicteric Neck:  No masses, no thyromegaly Lungs:  CTA bilaterally; normal respiratory effort CV:  Regular rate and rhythm; no murmurs Abd:  +bowel sounds, soft, non-tender, protruding umbilicus - reducible; 1 cm fascial defect Ext:  Well-perfused; no edema Skin:  Warm, dry; no sign of jaundice  Data Reviewed none  Assessment    Umbilical hernia - reducible     Plan    Umbilical hernia repair with mesh after cardiac clearance.  The surgical procedure has been discussed with the patient.  Potential risks, benefits, alternative treatments, and expected outcomes have been explained.  All of the patient's questions at this time have been answered.  The likelihood of reaching the patient's treatment goal is good.  The patient understand the proposed surgical procedure and wishes to proceed.         Aladdin Kollmann K. 05/04/2013, 12:24 PM

## 2013-05-06 ENCOUNTER — Encounter (INDEPENDENT_AMBULATORY_CARE_PROVIDER_SITE_OTHER): Payer: Self-pay

## 2013-05-13 ENCOUNTER — Encounter (HOSPITAL_COMMUNITY): Payer: Self-pay | Admitting: Pharmacy Technician

## 2013-05-18 ENCOUNTER — Encounter: Payer: Self-pay | Admitting: Cardiovascular Disease

## 2013-05-18 NOTE — Telephone Encounter (Signed)
This encounter was created in error - please disregard.

## 2013-05-18 NOTE — Telephone Encounter (Signed)
New message:  Pt states someone from our office called her and told her that she did not need to keep her cardiac clearance appt for tomorrow.. States someone told her she was already cleared... Pt wanted to clarify if Dr. Burt Knack had cleared her for surgery... Or if she needs to keep her appt tomorrow.

## 2013-05-19 ENCOUNTER — Ambulatory Visit: Payer: Self-pay | Admitting: Physician Assistant

## 2013-05-20 ENCOUNTER — Encounter: Payer: Self-pay | Admitting: Internal Medicine

## 2013-05-20 ENCOUNTER — Encounter: Payer: Self-pay | Admitting: Dietician

## 2013-05-21 ENCOUNTER — Encounter (HOSPITAL_COMMUNITY): Payer: Self-pay

## 2013-05-21 ENCOUNTER — Encounter (HOSPITAL_COMMUNITY)
Admission: RE | Admit: 2013-05-21 | Discharge: 2013-05-21 | Disposition: A | Discharge status: Home / Self Care | Payer: Medicare Other | Source: Ambulatory Visit | Attending: Surgery | Admitting: Surgery

## 2013-05-21 ENCOUNTER — Ambulatory Visit (HOSPITAL_COMMUNITY)
Admission: RE | Admit: 2013-05-21 | Discharge: 2013-05-21 | Disposition: A | Discharge status: Home / Self Care | Payer: Medicare Other | Source: Ambulatory Visit | Attending: Anesthesiology | Admitting: Anesthesiology

## 2013-05-21 DIAGNOSIS — Z01818 Encounter for other preprocedural examination: Secondary | ICD-10-CM | POA: Insufficient documentation

## 2013-05-21 DIAGNOSIS — Z01812 Encounter for preprocedural laboratory examination: Secondary | ICD-10-CM | POA: Insufficient documentation

## 2013-05-21 HISTORY — DX: Personal history of colon polyps, unspecified: Z86.0100

## 2013-05-21 HISTORY — DX: Dorsalgia, unspecified: M54.9

## 2013-05-21 HISTORY — DX: Transient cerebral ischemic attack, unspecified: G45.9

## 2013-05-21 HISTORY — DX: Major depressive disorder, single episode, unspecified: F32.9

## 2013-05-21 HISTORY — DX: Depression, unspecified: F32.A

## 2013-05-21 HISTORY — DX: Type 2 diabetes mellitus without complications: E11.9

## 2013-05-21 HISTORY — DX: Personal history of colonic polyps: Z86.010

## 2013-05-21 LAB — CBC
HCT: 37.3 % (ref 36.0–46.0)
Hemoglobin: 12.1 g/dL (ref 12.0–15.0)
MCH: 28.3 pg (ref 26.0–34.0)
MCHC: 32.4 g/dL (ref 30.0–36.0)
MCV: 87.1 fL (ref 78.0–100.0)
Platelets: 289 10*3/uL (ref 150–400)
RBC: 4.28 MIL/uL (ref 3.87–5.11)
RDW: 14.1 % (ref 11.5–15.5)
WBC: 6.4 10*3/uL (ref 4.0–10.5)

## 2013-05-21 LAB — BASIC METABOLIC PANEL
BUN: 9 mg/dL (ref 6–23)
CO2: 27 mEq/L (ref 19–32)
Calcium: 9.5 mg/dL (ref 8.4–10.5)
Chloride: 104 mEq/L (ref 96–112)
Creatinine, Ser: 0.8 mg/dL (ref 0.50–1.10)
GFR calc Af Amer: 88 mL/min — ABNORMAL LOW (ref >90)
GFR calc non Af Amer: 76 mL/min — ABNORMAL LOW (ref >90)
Glucose, Bld: 104 mg/dL — ABNORMAL HIGH (ref 70–99)
Potassium: 3.5 mEq/L — ABNORMAL LOW (ref 3.7–5.3)
Sodium: 144 mEq/L (ref 137–147)

## 2013-05-21 MED ORDER — CHLORHEXIDINE GLUCONATE 4 % EX LIQD: 1.0000 "application " | Freq: Once | CUTANEOUS | Status: DC

## 2013-05-21 NOTE — Progress Notes (Addendum)
Cardiologist is Dr.Cooper and last visit Dec 2014  Stress test done > 40yrs ago  Echo done > 32yrs ago  Heart cath in epic from 2005/2007/2010  EKG in epic from 01-20-13  Sleep study in epic from 2014 and confirmed no sleep apnea  Denies CXR in past yr  Medical Md is Dr.Erik Phelps Dodge

## 2013-05-21 NOTE — Pre-Procedure Instructions (Signed)
[name]  05/21/2013   Your procedure is scheduled on:  Thurs, April 23 @ 1:40 PM  Report to [facility]  at 11:30 AM.  Call this number if you have problems the morning of surgery: [phone]   Remember:   Do not eat food or drink liquids after midnight.   Take these medicines the morning of surgery with A SIP OF WATER: Amlodipine(Norvasc),Carvedilol(Coreg),and Synthroid(Levothyroxine)              Stop taking your Aspirin. No Goody's,BC's,Aleve,Ibuprofen,Fish Oil,or any Herbal Medications   Do not wear jewelry, make-up or nail polish.  Do not wear lotions, powders, or perfumes. You may wear deodorant.  Do not shave 48 hours prior to surgery.   Do not bring valuables to the hospital.  [facility] is not responsible                  for any belongings or valuables.               Contacts, dentures or bridgework may not be worn into surgery.  Leave suitcase in the car. After surgery it may be brought to your room.  For patients admitted to the hospital, discharge time is determined by your                treatment team.               Patients discharged the day of surgery will not be allowed to drive  home.    Special Instructions:  [facility] - Preparing for Surgery  Before surgery, you can play an important role.  Because skin is not sterile, your skin needs to be as free of germs as possible.  You can reduce the number of germs on you skin by washing with CHG (chlorahexidine gluconate) soap before surgery.  CHG is an antiseptic cleaner which kills germs and bonds with the skin to continue killing germs even after washing.  Please DO NOT use if you have an allergy to CHG or antibacterial soaps.  If your skin becomes reddened/irritated stop using the CHG and inform your nurse when you arrive at Short Stay.  Do not shave (including legs and underarms) for at least 48 hours prior to the first CHG shower.  You may shave your face.  Please follow these instructions  carefully:   1.  Shower with CHG Soap the night before surgery and the                                morning of Surgery.  2.  If you choose to wash your hair, wash your hair first as usual with your       normal shampoo.  3.  After you shampoo, rinse your hair and body thoroughly to remove the                      Shampoo.  4.  Use CHG as you would any other liquid soap.  You can apply chg directly       to the skin and wash gently with scrungie or a clean washcloth.  5.  Apply the CHG Soap to your body ONLY FROM THE NECK DOWN.        Do not use on open wounds or open sores.  Avoid contact with your eyes,       ears, mouth and genitals (  private parts).  Wash genitals (private parts)       with your normal soap.  6.  Wash thoroughly, paying special attention to the area where your surgery        will be performed.  7.  Thoroughly rinse your body with warm water from the neck down.  8.  DO NOT shower/wash with your normal soap after using and rinsing off       the CHG Soap.  9.  Pat yourself dry with a clean towel.            10.  Wear clean pajamas.            11.  Place clean sheets on your bed the night of your first shower and do not        sleep with pets.  Day of Surgery  Do not apply any lotions/deoderants the morning of surgery.  Please wear clean clothes to the hospital/surgery center.     Please read over the following fact sheets that you were given: Pain Booklet, Coughing and Deep Breathing and Surgical Site Infection Prevention

## 2013-05-24 NOTE — Addendum Note (Signed)
Addended by: Hulan Fray on: 05/24/2013 02:13 PM   Modules accepted: Orders

## 2013-05-26 ENCOUNTER — Encounter: Payer: Self-pay | Admitting: Dietician

## 2013-05-26 ENCOUNTER — Ambulatory Visit (HOSPITAL_BASED_OUTPATIENT_CLINIC_OR_DEPARTMENT_OTHER): Payer: Medicare Other | Admitting: Radiology

## 2013-05-26 VITALS — Ht 64.5 in | Wt 195.0 lb

## 2013-05-26 DIAGNOSIS — G4733 Obstructive sleep apnea (adult) (pediatric): Secondary | ICD-10-CM

## 2013-05-26 MED ORDER — CEFAZOLIN SODIUM-DEXTROSE 2-3 GM-% IV SOLR
2.0000 g | INTRAVENOUS | Status: AC
  Administered 2013-05-27: 2 g via INTRAVENOUS
  Filled 2013-05-26: qty 50

## 2013-05-27 ENCOUNTER — Encounter (HOSPITAL_COMMUNITY)
Admission: RE | Disposition: A | Discharge status: Home / Self Care | Payer: Self-pay | Source: Ambulatory Visit | Attending: Surgery

## 2013-05-27 ENCOUNTER — Ambulatory Visit (HOSPITAL_COMMUNITY)
Admission: RE | Admit: 2013-05-27 | Discharge: 2013-05-27 | Disposition: A | Discharge status: Home / Self Care | Payer: Medicare Other | Source: Ambulatory Visit | Attending: Surgery | Admitting: Surgery

## 2013-05-27 ENCOUNTER — Encounter (HOSPITAL_COMMUNITY): Payer: Self-pay | Admitting: *Deleted

## 2013-05-27 ENCOUNTER — Encounter (HOSPITAL_COMMUNITY): Payer: Medicare Other | Admitting: Anesthesiology

## 2013-05-27 ENCOUNTER — Ambulatory Visit (HOSPITAL_COMMUNITY): Payer: Medicare Other | Admitting: Anesthesiology

## 2013-05-27 DIAGNOSIS — Z8673 Personal history of transient ischemic attack (TIA), and cerebral infarction without residual deficits: Secondary | ICD-10-CM | POA: Insufficient documentation

## 2013-05-27 DIAGNOSIS — Z87891 Personal history of nicotine dependence: Secondary | ICD-10-CM | POA: Insufficient documentation

## 2013-05-27 DIAGNOSIS — K429 Umbilical hernia without obstruction or gangrene: Secondary | ICD-10-CM | POA: Insufficient documentation

## 2013-05-27 DIAGNOSIS — Z9861 Coronary angioplasty status: Secondary | ICD-10-CM | POA: Insufficient documentation

## 2013-05-27 DIAGNOSIS — E119 Type 2 diabetes mellitus without complications: Secondary | ICD-10-CM | POA: Insufficient documentation

## 2013-05-27 DIAGNOSIS — E785 Hyperlipidemia, unspecified: Secondary | ICD-10-CM | POA: Insufficient documentation

## 2013-05-27 DIAGNOSIS — Z7982 Long term (current) use of aspirin: Secondary | ICD-10-CM | POA: Insufficient documentation

## 2013-05-27 DIAGNOSIS — I2510 Atherosclerotic heart disease of native coronary artery without angina pectoris: Secondary | ICD-10-CM | POA: Insufficient documentation

## 2013-05-27 DIAGNOSIS — I10 Essential (primary) hypertension: Secondary | ICD-10-CM | POA: Insufficient documentation

## 2013-05-27 DIAGNOSIS — I701 Atherosclerosis of renal artery: Secondary | ICD-10-CM | POA: Insufficient documentation

## 2013-05-27 DIAGNOSIS — E039 Hypothyroidism, unspecified: Secondary | ICD-10-CM | POA: Insufficient documentation

## 2013-05-27 DIAGNOSIS — I739 Peripheral vascular disease, unspecified: Secondary | ICD-10-CM | POA: Insufficient documentation

## 2013-05-27 HISTORY — PX: UMBILICAL HERNIA REPAIR: SHX196

## 2013-05-27 HISTORY — PX: INSERTION OF MESH: SHX5868

## 2013-05-27 LAB — GLUCOSE, CAPILLARY: Glucose-Capillary: 115 mg/dL — ABNORMAL HIGH (ref 70–99)

## 2013-05-27 SURGERY — REPAIR, HERNIA, UMBILICAL, ADULT
Anesthesia: General | Site: Abdomen

## 2013-05-27 MED ORDER — LIDOCAINE HCL (CARDIAC) 20 MG/ML IV SOLN
INTRAVENOUS | Status: DC | PRN
  Administered 2013-05-27: 40 mg via INTRAVENOUS

## 2013-05-27 MED ORDER — PROMETHAZINE HCL 25 MG/ML IJ SOLN
6.2500 mg | Freq: Four times a day (QID) | INTRAMUSCULAR | Status: AC | PRN
  Administered 2013-05-27: 6.25 mg via INTRAVENOUS

## 2013-05-27 MED ORDER — PROPOFOL 10 MG/ML IV BOLUS
INTRAVENOUS | Status: AC
  Filled 2013-05-27: qty 20

## 2013-05-27 MED ORDER — FENTANYL CITRATE 0.05 MG/ML IJ SOLN
INTRAMUSCULAR | Status: AC
Start: 2013-05-27 — End: 2013-05-27
  Filled 2013-05-27: qty 5

## 2013-05-27 MED ORDER — HYDROMORPHONE HCL PF 1 MG/ML IJ SOLN
0.2500 mg | INTRAMUSCULAR | Status: DC | PRN
  Administered 2013-05-27 (×2): 0.5 mg via INTRAVENOUS

## 2013-05-27 MED ORDER — OXYCODONE HCL 5 MG PO TABS: 5.0000 mg | ORAL_TABLET | Freq: Once | ORAL | Status: DC | PRN

## 2013-05-27 MED ORDER — MIDAZOLAM HCL 5 MG/5ML IJ SOLN
INTRAMUSCULAR | Status: DC | PRN
  Administered 2013-05-27: 2 mg via INTRAVENOUS

## 2013-05-27 MED ORDER — BUPIVACAINE-EPINEPHRINE PF 0.25-1:200000 % IJ SOLN: INTRAMUSCULAR | Status: DC | PRN

## 2013-05-27 MED ORDER — OXYCODONE-ACETAMINOPHEN 5-325 MG PO TABS: 1.0000 | ORAL_TABLET | ORAL | Status: DC | PRN

## 2013-05-27 MED ORDER — ONDANSETRON HCL 4 MG/2ML IJ SOLN
INTRAMUSCULAR | Status: DC | PRN
  Administered 2013-05-27: 4 mg via INTRAVENOUS

## 2013-05-27 MED ORDER — FENTANYL CITRATE 0.05 MG/ML IJ SOLN
INTRAMUSCULAR | Status: DC | PRN
  Administered 2013-05-27: 50 ug via INTRAVENOUS
  Administered 2013-05-27: 25 ug via INTRAVENOUS
  Administered 2013-05-27: 50 ug via INTRAVENOUS
  Administered 2013-05-27: 25 ug via INTRAVENOUS

## 2013-05-27 MED ORDER — ONDANSETRON HCL 4 MG/2ML IJ SOLN
INTRAMUSCULAR | Status: AC
  Filled 2013-05-27: qty 2

## 2013-05-27 MED ORDER — BUPIVACAINE-EPINEPHRINE 0.25% -1:200000 IJ SOLN
INTRAMUSCULAR | Status: DC | PRN
  Administered 2013-05-27: 10 mL

## 2013-05-27 MED ORDER — LIDOCAINE HCL (CARDIAC) 20 MG/ML IV SOLN
INTRAVENOUS | Status: AC
  Filled 2013-05-27: qty 5

## 2013-05-27 MED ORDER — OXYCODONE HCL 5 MG/5ML PO SOLN: 5.0000 mg | Freq: Once | ORAL | Status: DC | PRN

## 2013-05-27 MED ORDER — MORPHINE SULFATE 2 MG/ML IJ SOLN: 2.0000 mg | INTRAMUSCULAR | Status: DC | PRN

## 2013-05-27 MED ORDER — LACTATED RINGERS IV SOLN
INTRAVENOUS | Status: DC
  Administered 2013-05-27 (×2): via INTRAVENOUS

## 2013-05-27 MED ORDER — PROMETHAZINE HCL 25 MG/ML IJ SOLN
INTRAMUSCULAR | Status: AC
  Administered 2013-05-27: 6.25 mg via INTRAVENOUS
  Filled 2013-05-27: qty 1

## 2013-05-27 MED ORDER — HYDROMORPHONE HCL PF 1 MG/ML IJ SOLN
INTRAMUSCULAR | Status: AC
  Filled 2013-05-27: qty 1

## 2013-05-27 MED ORDER — PROPOFOL 10 MG/ML IV BOLUS
INTRAVENOUS | Status: DC | PRN
  Administered 2013-05-27: 120 mg via INTRAVENOUS

## 2013-05-27 MED ORDER — ONDANSETRON HCL 4 MG/2ML IJ SOLN: 4.0000 mg | INTRAMUSCULAR | Status: DC | PRN

## 2013-05-27 SURGICAL SUPPLY — 44 items
APL SKNCLS STERI-STRIP NONHPOA (GAUZE/BANDAGES/DRESSINGS) ×1
BENZOIN TINCTURE PRP APPL 2/3 (GAUZE/BANDAGES/DRESSINGS) ×2 IMPLANT
BLADE 15 SAFETY STRL DISP (BLADE) ×2 IMPLANT
BLADE SURG ROTATE 9660 (MISCELLANEOUS) IMPLANT
CANISTER SUCTION 2500CC (MISCELLANEOUS) ×2 IMPLANT
CHLORAPREP W/TINT 26ML (MISCELLANEOUS) ×2 IMPLANT
COVER SURGICAL LIGHT HANDLE (MISCELLANEOUS) ×2 IMPLANT
DRAPE PED LAPAROTOMY (DRAPES) ×2 IMPLANT
DRAPE UTILITY 15X26 W/TAPE STR (DRAPE) ×4 IMPLANT
DRSG TEGADERM 4X4.75 (GAUZE/BANDAGES/DRESSINGS) ×2 IMPLANT
ELECT CAUTERY BLADE 6.4 (BLADE) ×2 IMPLANT
ELECT REM PT RETURN 9FT ADLT (ELECTROSURGICAL) ×2
ELECTRODE REM PT RTRN 9FT ADLT (ELECTROSURGICAL) ×1 IMPLANT
GAUZE SPONGE 4X4 16PLY XRAY LF (GAUZE/BANDAGES/DRESSINGS) ×2 IMPLANT
GLOVE BIO SURGEON STRL SZ7 (GLOVE) ×2 IMPLANT
GLOVE BIO SURGEON STRL SZ7.5 (GLOVE) ×2 IMPLANT
GLOVE BIOGEL PI IND STRL 7.5 (GLOVE) ×2 IMPLANT
GLOVE BIOGEL PI INDICATOR 7.5 (GLOVE) ×2
GOWN STRL REUS W/ TWL LRG LVL3 (GOWN DISPOSABLE) ×2 IMPLANT
GOWN STRL REUS W/TWL LRG LVL3 (GOWN DISPOSABLE) ×4
KIT BASIN OR (CUSTOM PROCEDURE TRAY) ×2 IMPLANT
KIT ROOM TURNOVER OR (KITS) ×2 IMPLANT
MESH VENTRALEX ST 1-7/10 CRC S (Mesh General) ×2 IMPLANT
NEEDLE HYPO 25GX1X1/2 BEV (NEEDLE) ×2 IMPLANT
NS IRRIG 1000ML POUR BTL (IV SOLUTION) ×2 IMPLANT
PACK SURGICAL SETUP 50X90 (CUSTOM PROCEDURE TRAY) ×2 IMPLANT
PAD ARMBOARD 7.5X6 YLW CONV (MISCELLANEOUS) ×4 IMPLANT
PENCIL BUTTON HOLSTER BLD 10FT (ELECTRODE) ×2 IMPLANT
SPONGE GAUZE 4X4 12PLY (GAUZE/BANDAGES/DRESSINGS) ×2 IMPLANT
SPONGE GAUZE 4X4 12PLY STER LF (GAUZE/BANDAGES/DRESSINGS) ×2 IMPLANT
SPONGE LAP 18X18 X RAY DECT (DISPOSABLE) ×2 IMPLANT
STRIP CLOSURE SKIN 1/2X4 (GAUZE/BANDAGES/DRESSINGS) ×2 IMPLANT
SUT MNCRL AB 4-0 PS2 18 (SUTURE) ×4 IMPLANT
SUT NOVA NAB GS-21 0 18 T12 DT (SUTURE) ×4 IMPLANT
SUT VIC AB 3-0 SH 27 (SUTURE) ×2
SUT VIC AB 3-0 SH 27X BRD (SUTURE) ×1 IMPLANT
SYR BULB 3OZ (MISCELLANEOUS) ×2 IMPLANT
SYR CONTROL 10ML LL (SYRINGE) ×2 IMPLANT
TOWEL OR 17X24 6PK STRL BLUE (TOWEL DISPOSABLE) ×2 IMPLANT
TOWEL OR 17X26 10 PK STRL BLUE (TOWEL DISPOSABLE) ×2 IMPLANT
TOWEL OR NON WOVEN STRL DISP B (DISPOSABLE) ×2 IMPLANT
TUBE CONNECTING 12X1/4 (SUCTIONS) ×2 IMPLANT
WATER STERILE IRR 1000ML POUR (IV SOLUTION) IMPLANT
YANKAUER SUCT BULB TIP NO VENT (SUCTIONS) ×2 IMPLANT

## 2013-05-27 NOTE — Anesthesia Procedure Notes (Signed)
Procedure Name: LMA Insertion Date/Time: 05/27/2013 1:22 PM Performed by: Manuela Schwartz B Pre-anesthesia Checklist: Patient identified, Emergency Drugs available, Suction available, Patient being monitored and Timeout performed Patient Re-evaluated:Patient Re-evaluated prior to inductionOxygen Delivery Method: Circle system utilized Preoxygenation: Pre-oxygenation with 100% oxygen Intubation Type: IV induction LMA: LMA inserted LMA Size: 4.0 Number of attempts: 1 Placement Confirmation: positive ETCO2 and breath sounds checked- equal and bilateral Tube secured with: Tape Dental Injury: Teeth and Oropharynx as per pre-operative assessment

## 2013-05-27 NOTE — Op Note (Signed)
Indications:  The patient presented with a history of an enlarging, non-reducible umbilical hernia.  The patient was examined and we recommended umbilical hernia repair with mesh.  Pre-operative diagnosis:  Umbilical hernia  Post-operative diagnosis:  Same  Surgeon: Imogene Burn. Yvon Mccord   Assistants: none  Anesthesia: General LMA anesthesia  ASA Class: 2   Procedure Details  The patient was seen again in the Holding Room. The risks, benefits, complications, treatment options, and expected outcomes were discussed with the patient. The possibilities of reaction to medication, pulmonary aspiration, perforation of viscus, bleeding, recurrent infection, the need for additional procedures, and development of a complication requiring transfusion or further operation were discussed with the patient and/or family. There was concurrence with the proposed plan, and informed consent was obtained. The site of surgery was properly noted/marked. The patient was taken to the Operating Room, identified as Clearance Coots, and the procedure verified as umbilical hernia repair. A Time Out was held and the above information confirmed.  After an adequate level of general anesthesia was obtained, the patient's abdomen was prepped with Chloraprep and draped in sterile fashion.  We made a transverse incision above the umbilicus.  Dissection was carried down to the hernia sac with cautery.  We dissected bluntly around the hernia sac down to the edge of the fascial defect.  We reduced the hernia sac back into the pre-peritoneal space.  The fascial defect measured 2 cm.  We cleared the fascia in all directions.  A small Ventralex mesh was inserted into the pre-peritoneal space and was deployed.  The mesh was secured with four trans-fascial sutures of 0 Novofil.  The fascial defect was closed with multiple interrupted figure-of-eight 0 Novofil sutures.  The base of the umbilicus was tacked down with 3-0 Vicryl.  3-0 Vicryl was used  to close the subcutaneous tissues and 4-0 Monocryl was used to close the skin.  Steri-strips and clean dressing were applied.  The patient was extubated and brought to the recovery room in stable condition.  All sponge, instrument, and needle counts were correct prior to closure and at the conclusion of the case.   Estimated Blood Loss: Minimal          Complications: None; patient tolerated the procedure well.         Disposition: PACU - hemodynamically stable.         Condition: stable  Imogene Burn. Georgette Dover, MD, [facility]  General/ Trauma Surgery  05/27/2013 2:11 PM

## 2013-05-27 NOTE — H&P (View-Only) (Signed)
[name], female   DOB: May 14, 1945, 68 y.o.   MRN: [id number]  Chief Complaint  Patient presents with  . Umbilical Hernia    new pt    HPI [name] is a 68 y.o. female.  Referred by Dr. Johnnette Gourd for evaluation of umbilical hernia  HPI This is a 68 year old female who presents with several years of an enlarging bulge at her umbilicus. This has become fairly large and uncomfortable. He remains reducible. She denies any obstructive symptoms. She presents now for surgical evaluation.  The patient has coronary stents in place and is followed by Dr. Sherren Mocha of cardiology. Past Medical History  Diagnosis Date  . Hypertension   . Cervical polyp   . Vaginal discharge   . Neck mass   . Cervical lymphadenopathy   . Urinary tract infection   . Renal artery stenosis   . Cerebrovascular accident   . Hypothyroidism   . Hypertension   . Hyperlipidemia   . Coronary artery disease     Prior stent of the lCX with chronic total occlusion of a small RCA. Last cath in 2004 showing patent stent, nonobstructive disease and total RCA. EF is 65 to 70%. She has been managed medically.   . Renal artery stenosis   . Tubular adenoma of rectum 10/24/04    Past Surgical History  Procedure Laterality Date  . Cardiac catheterization  2005/2006/2007  . Colonoscopy  10/2004    Family History  Problem Relation Age of Onset  . Hypertension Mother   . Osteoarthritis Mother   . Alzheimer's disease Father   . Cancer Neg Hx   . Stroke Neg Hx     Social History History  Substance Use Topics  . Smoking status: Former Smoker    Quit date: 02/04/1993  . Smokeless tobacco: Not on file  . Alcohol Use: Yes     Comment: Wine on special occasions    No Known Allergies  Current Outpatient Prescriptions  Medication Sig Dispense Refill  . amLODipine (NORVASC) 10 MG tablet TAKE ONE TABLET BY MOUTH EVERY DAY  30 tablet  11  . aspirin 325 MG tablet Take 325 mg by mouth daily.        .  carvedilol (COREG) 25 MG tablet Take 0.5 tablets (12.5 mg total) by mouth 2 (two) times daily with a meal.  60 tablet  5  . FLUoxetine (PROZAC) 10 MG capsule Take 1 capsule (10 mg total) by mouth daily.  30 capsule  11  . levothyroxine (SYNTHROID, LEVOTHROID) 125 MCG tablet TAKE ONE TABLET BY MOUTH ONCE DAILY  30 tablet  5  . lisinopril-hydrochlorothiazide (PRINZIDE,ZESTORETIC) 20-12.5 MG per tablet Take 1 tablet by mouth 2 (two) times daily.  60 tablet  5  . metFORMIN (GLUCOPHAGE) 500 MG tablet Take 1 tablet (500 mg total) by mouth daily with breakfast.  60 tablet  11  . nitroGLYCERIN (NITROSTAT) 0.4 MG SL tablet DISSOLVE ONE TABLET UNDER THE TONGUE EVERY 5 MINUTES AS NEEDED FOR CHEST PAIN.  DO NOT EXCEED A TOTAL OF 3 DOSES IN 15 MINUTES NOW  25 tablet  1  . rosuvastatin (CRESTOR) 10 MG tablet Take 1 tablet (10 mg total) by mouth at bedtime.  30 tablet  11   No current facility-administered medications for this visit.    Review of Systems Review of Systems  Constitutional: Negative for fever, chills and unexpected weight change.  HENT: Negative for congestion, hearing loss, sore throat, trouble swallowing and voice change.  Eyes: Negative for visual disturbance.  Respiratory: Negative for cough and wheezing.   Cardiovascular: Negative for chest pain, palpitations and leg swelling.  Gastrointestinal: Positive for abdominal pain and abdominal distention. Negative for nausea, vomiting, diarrhea, constipation, blood in stool and anal bleeding.  Genitourinary: Negative for hematuria, vaginal bleeding and difficulty urinating.  Musculoskeletal: Negative for arthralgias.  Skin: Negative for rash and wound.  Neurological: Negative for seizures, syncope and headaches.  Hematological: Negative for adenopathy. Does not bruise/bleed easily.  Psychiatric/Behavioral: Negative for confusion.    Blood pressure 132/80, pulse 75, temperature 96.8 F (36 C), temperature source Oral, resp. rate 16, height  5\' 4"  (1.626 m), weight 206 lb 6.4 oz (93.622 kg).  Physical Exam Physical Exam WDWN in NAD HEENT:  EOMI, sclera anicteric Neck:  No masses, no thyromegaly Lungs:  CTA bilaterally; normal respiratory effort CV:  Regular rate and rhythm; no murmurs Abd:  +bowel sounds, soft, non-tender, protruding umbilicus - reducible; 1 cm fascial defect Ext:  Well-perfused; no edema Skin:  Warm, dry; no sign of jaundice  Data Reviewed none  Assessment    Umbilical hernia - reducible     Plan    Umbilical hernia repair with mesh after cardiac clearance.  The surgical procedure has been discussed with the patient.  Potential risks, benefits, alternative treatments, and expected outcomes have been explained.  All of the patient's questions at this time have been answered.  The likelihood of reaching the patient's treatment goal is good.  The patient understand the proposed surgical procedure and wishes to proceed.         Jari Dipasquale K. 05/04/2013, 12:24 PM

## 2013-05-27 NOTE — Discharge Instructions (Signed)
[facility] ° °UMBILICAL HERNIA REPAIR: POST OP INSTRUCTIONS ° °Always review your discharge instruction sheet given to you by the facility where your surgery was performed. °IF YOU HAVE DISABILITY OR FAMILY LEAVE FORMS, YOU MUST BRING THEM TO THE OFFICE FOR PROCESSING.   °DO NOT GIVE THEM TO YOUR DOCTOR. ° °1. A  prescription for pain medication may be given to you upon discharge.  Take your pain medication as prescribed, if needed.  If narcotic pain medicine is not needed, then you may take acetaminophen (Tylenol) or ibuprofen (Advil) as needed. °2. Take your usually prescribed medications unless otherwise directed. °3. If you need a refill on your pain medication, please contact your pharmacy.  They will contact our office to request authorization. Prescriptions will not be filled after 5 pm or on week-ends. °4. You should follow a light diet the first 24 hours after arrival home, such as soup and crackers, etc.  Be sure to include lots of fluids daily.  Resume your normal diet the day after surgery. °5. Most patients will experience some swelling and bruising around the umbilicus or in the groin and scrotum.  Ice packs and reclining will help.  Swelling and bruising can take several days to resolve.  °6. It is common to experience some constipation if taking pain medication after surgery.  Increasing fluid intake and taking a stool softener (such as Colace) will usually help or prevent this problem from occurring.  A mild laxative (Milk of Magnesia or Miralax) should be taken according to package directions if there are no bowel movements after 48 hours. °7. Unless discharge instructions indicate otherwise, you may remove your bandages 24-48 hours after surgery, and you may shower at that time.  You will have steri-strips (small skin tapes) in place directly over the incision.  These strips should be left on the skin for 7-10 days. °8. ACTIVITIES:  You may resume regular (light) daily activities  beginning the next day--such as daily self-care, walking, climbing stairs--gradually increasing activities as tolerated.  You may have sexual intercourse when it is comfortable.  Refrain from any heavy lifting or straining until approved by your doctor. °a. You may drive when you are no longer taking prescription pain medication, you can comfortably wear a seatbelt, and you can safely maneuver your car and apply brakes. °b. RETURN TO WORK:  2-3 weeks with light duty - no lifting over 15 lbs. °9. You should see your doctor in the office for a follow-up appointment approximately 2-3 weeks after your surgery.  Make sure that you call for this appointment within a day or two after you arrive home to insure a convenient appointment time. °10. OTHER INSTRUCTIONS:  __________________________________________________________________________________________________________________________________________________________________________________________  °WHEN TO CALL YOUR DOCTOR: °1. Fever over 101.0 °2. Inability to urinate °3. Nausea and/or vomiting °4. Extreme swelling or bruising °5. Continued bleeding from incision. °6. Increased pain, redness, or drainage from the incision ° °The clinic staff is available to answer your questions during regular business hours.  Please don’t hesitate to call and ask to speak to one of the nurses for clinical concerns.  If you have a medical emergency, go to the nearest emergency room or call 911.  A surgeon from [facility] is always on call at the hospital ° ° °[address], [address], [place], [place]  [address] ? ° [address] [address], [place], [place]   [address] °[phone]    [phone]    FAX [phone] °Web site: [email or web address] ° ° °

## 2013-05-27 NOTE — Transfer of Care (Signed)
Immediate Anesthesia Transfer of Care Note  Patient: [name]  Procedure(s) Performed: Procedure(s): HERNIA REPAIR UMBILICAL ADULT (N/A) INSERTION OF MESH (N/A)  Patient Location: PACU  Anesthesia Type:General  Level of Consciousness: awake, alert  and oriented  Airway & Oxygen Therapy: Patient Spontanous Breathing and Patient connected to nasal cannula oxygen  Post-op Assessment: Report given to PACU RN and Post -op Vital signs reviewed and stable  Post vital signs: Reviewed and stable  Complications: No apparent anesthesia complications

## 2013-05-27 NOTE — Interval H&P Note (Signed)
History and Physical Interval Note:  05/27/2013 12:00 PM  [name]  has presented today for surgery, with the diagnosis of umbilical hernia   The various methods of treatment have been discussed with the patient and family. After consideration of risks, benefits and other options for treatment, the patient has consented to  Procedure(s): HERNIA REPAIR UMBILICAL ADULT (N/A) INSERTION OF MESH (N/A) as a surgical intervention .  The patient's history has been reviewed, patient examined, no change in status, stable for surgery.  I have reviewed the patient's chart and labs.  Questions were answered to the patient's satisfaction.     Imogene Burn. Kalei Mckillop

## 2013-05-27 NOTE — Anesthesia Postprocedure Evaluation (Signed)
  Anesthesia Post-op Note  Patient: [name]  Procedure(s) Performed: Procedure(s): HERNIA REPAIR UMBILICAL ADULT (N/A) INSERTION OF MESH (N/A)  Patient Location: PACU  Anesthesia Type:General  Level of Consciousness: awake and alert   Airway and Oxygen Therapy: Patient Spontanous Breathing  Post-op Pain: mild  Post-op Assessment: Post-op Vital signs reviewed, Patient's Cardiovascular Status Stable and Respiratory Function Stable  Post-op Vital Signs: Reviewed  Filed Vitals:   05/27/13 1430  BP: 143/74  Pulse: 71  Temp:   Resp: 13    Complications: No apparent anesthesia complications

## 2013-05-27 NOTE — Anesthesia Preprocedure Evaluation (Signed)
Anesthesia Evaluation  Patient identified by MRN, date of birth, ID band Patient awake    Reviewed: Allergy & Precautions, H&P , NPO status , Patient's Chart, lab work & pertinent test results, reviewed documented beta blocker date and time   Airway Mallampati: II TM Distance: >3 FB Neck ROM: Full    Dental no notable dental hx. (+) Dental Advisory Given, Poor Dentition, Edentulous Upper   Pulmonary neg pulmonary ROS, former smoker,  breath sounds clear to auscultation  Pulmonary exam normal       Cardiovascular hypertension, On Medications and On Home Beta Blockers + CAD, + Cardiac Stents and + Peripheral Vascular Disease Rhythm:Regular Rate:Normal     Neuro/Psych TIA   GI/Hepatic negative GI ROS, Neg liver ROS,   Endo/Other  diabetes, Well Controlled, Type 2, Oral Hypoglycemic AgentsHypothyroidism   Renal/GU negative Renal ROS  negative genitourinary   Musculoskeletal   Abdominal   Peds  Hematology negative hematology ROS (+)   Anesthesia Other Findings   Reproductive/Obstetrics negative OB ROS                           Anesthesia Physical Anesthesia Plan  ASA: III  Anesthesia Plan: General   Post-op Pain Management:    Induction: Intravenous  Airway Management Planned: LMA  Additional Equipment:   Intra-op Plan:   Post-operative Plan: Extubation in OR  Informed Consent: I have reviewed the patients History and Physical, chart, labs and discussed the procedure including the risks, benefits and alternatives for the proposed anesthesia with the patient or authorized representative who has indicated his/her understanding and acceptance.   Dental advisory given  Plan Discussed with: CRNA  Anesthesia Plan Comments:         Anesthesia Quick Evaluation

## 2013-05-27 NOTE — Progress Notes (Signed)
Belonging returned to pt, including phone and dentures

## 2013-05-28 ENCOUNTER — Encounter: Payer: Self-pay | Admitting: Gastroenterology

## 2013-05-29 DIAGNOSIS — G4730 Sleep apnea, unspecified: Secondary | ICD-10-CM

## 2013-05-29 DIAGNOSIS — G4733 Obstructive sleep apnea (adult) (pediatric): Secondary | ICD-10-CM

## 2013-05-29 DIAGNOSIS — G4710 Hypersomnia, unspecified: Secondary | ICD-10-CM

## 2013-05-29 NOTE — Sleep Study (Signed)
   NAME: [name] DATE OF BIRTH:  March 14, 1945 MEDICAL RECORD NUMBER [id number]  LOCATION: [facility]  PHYSICIAN: Mykeal Carrick D Dameshia Seybold  DATE OF STUDY: 05/26/2013  SLEEP STUDY TYPE: Nocturnal Polysomnogram               REFERRING PHYSICIAN: Bartholomew Crews, MD  INDICATION FOR STUDY: Hypersomnia with sleep apnea  EPWORTH SLEEPINESS SCORE:   12/24 HEIGHT: 5' 4.5" (163.8 cm)  WEIGHT: 195 lb (88.451 kg)    Body mass index is 32.97 kg/(m^2).  NECK SIZE: 14.5 in.  MEDICATIONS: Charted for review  SLEEP ARCHITECTURE: Total sleep time 401 minutes with sleep efficiency 96.5%. Stage I was 4.5%, stage II 72.9%, stage III absent, REM 22.6% of total sleep time. Sleep latency 4.5 minutes, REM latency 78 minutes, awake after sleep onset 11 minutes, arousal index 6.9. Bedtime medication: None  RESPIRATORY DATA: Apnea hypopneas index (AHI) 7.5 per hour. 50 total events scored including 7 obstructive apneas, 2 central apneas, 41 hypopneas. Events were more common while supine. REM AHI 25.9 per hour. There were not enough early events to permit protocol application of split CPAP titration.  OXYGEN DATA: Moderately loud snoring with oxygen desaturation to a nadir of 82% and mean oxygen saturation through the study of 92.3% on room air.  CARDIAC DATA: Sinus rhythm with PACs  MOVEMENT/PARASOMNIA: No significant motor disturbance, no bathroom trips  IMPRESSION/ RECOMMENDATION:   1) Mild obstructive sleep apnea/hypopnea syndrome, AHI 7.5 per hour with events in all positions, especially supine. REM AHI 25.9 per hour. Moderately loud snoring with oxygen desaturation to a nadir of 82% and mean oxygen saturation through the study of 92.3% on room air. 2) There were not enough events to meet protocol requirements for split CPAP titration. Most events were associated with REM. Treatment choices should be individualized at this age, but if appropriate, she can return to the sleep disorder  Center for dedicated CPAP titration study.   Signed Baird Lyons M.D. [place], [facility] adviser of Sleep Medicine  ELECTRONICALLY SIGNED ON:  05/29/2013, 12:46 PM [place] PH: [phone]   FX: [phone] [place]

## 2013-06-01 ENCOUNTER — Encounter (HOSPITAL_COMMUNITY): Payer: Self-pay | Admitting: Surgery

## 2013-06-04 ENCOUNTER — Other Ambulatory Visit: Payer: Self-pay | Admitting: Cardiovascular Disease

## 2013-06-11 ENCOUNTER — Encounter (INDEPENDENT_AMBULATORY_CARE_PROVIDER_SITE_OTHER): Payer: Medicare Other | Admitting: Surgery

## 2013-06-14 ENCOUNTER — Ambulatory Visit: Payer: Self-pay | Admitting: Internal Medicine

## 2013-06-17 ENCOUNTER — Encounter: Payer: Self-pay | Admitting: Internal Medicine

## 2013-07-19 ENCOUNTER — Encounter (INDEPENDENT_AMBULATORY_CARE_PROVIDER_SITE_OTHER): Payer: Medicare Other | Admitting: Surgery

## 2013-07-26 ENCOUNTER — Encounter (INDEPENDENT_AMBULATORY_CARE_PROVIDER_SITE_OTHER): Payer: Medicare Other | Admitting: Surgery

## 2013-08-04 ENCOUNTER — Encounter (INDEPENDENT_AMBULATORY_CARE_PROVIDER_SITE_OTHER): Payer: Medicare Other | Admitting: Surgery

## 2013-08-16 ENCOUNTER — Other Ambulatory Visit: Payer: Self-pay | Admitting: *Deleted

## 2013-08-16 DIAGNOSIS — E039 Hypothyroidism, unspecified: Secondary | ICD-10-CM

## 2013-08-16 DIAGNOSIS — E785 Hyperlipidemia, unspecified: Secondary | ICD-10-CM

## 2013-08-16 DIAGNOSIS — I10 Essential (primary) hypertension: Secondary | ICD-10-CM

## 2013-08-16 MED ORDER — LISINOPRIL-HYDROCHLOROTHIAZIDE 20-12.5 MG PO TABS: 1.0000 | ORAL_TABLET | Freq: Two times a day (BID) | ORAL | Status: DC

## 2013-08-16 NOTE — Telephone Encounter (Signed)
Message sent to front office to schedule pt an appt. 

## 2013-08-16 NOTE — Telephone Encounter (Signed)
Needs Sep appt PCP routine F/U

## 2013-08-27 ENCOUNTER — Encounter (INDEPENDENT_AMBULATORY_CARE_PROVIDER_SITE_OTHER): Payer: Medicare Other | Admitting: Surgery

## 2013-09-25 ENCOUNTER — Other Ambulatory Visit: Payer: Self-pay | Admitting: Internal Medicine

## 2013-09-30 ENCOUNTER — Encounter: Payer: Self-pay | Admitting: Internal Medicine

## 2013-10-08 ENCOUNTER — Encounter (INDEPENDENT_AMBULATORY_CARE_PROVIDER_SITE_OTHER): Payer: Medicare Other | Admitting: Surgery

## 2013-11-04 ENCOUNTER — Encounter: Payer: Self-pay | Admitting: Internal Medicine

## 2013-11-05 ENCOUNTER — Other Ambulatory Visit: Payer: Self-pay | Admitting: Cardiovascular Disease

## 2013-11-05 ENCOUNTER — Other Ambulatory Visit: Payer: Self-pay | Admitting: Internal Medicine

## 2013-11-11 ENCOUNTER — Encounter: Payer: Self-pay | Admitting: Internal Medicine

## 2013-11-25 ENCOUNTER — Ambulatory Visit (INDEPENDENT_AMBULATORY_CARE_PROVIDER_SITE_OTHER): Payer: Medicare Other | Admitting: Internal Medicine

## 2013-11-25 ENCOUNTER — Encounter: Payer: Self-pay | Admitting: Internal Medicine

## 2013-11-25 VITALS — BP 138/65 | HR 68 | Temp 98.4°F | Ht 64.5 in | Wt 219.3 lb

## 2013-11-25 DIAGNOSIS — E785 Hyperlipidemia, unspecified: Secondary | ICD-10-CM

## 2013-11-25 DIAGNOSIS — Z0000 Encounter for general adult medical examination without abnormal findings: Secondary | ICD-10-CM

## 2013-11-25 DIAGNOSIS — B351 Tinea unguium: Secondary | ICD-10-CM

## 2013-11-25 DIAGNOSIS — E119 Type 2 diabetes mellitus without complications: Secondary | ICD-10-CM

## 2013-11-25 DIAGNOSIS — Z23 Encounter for immunization: Secondary | ICD-10-CM

## 2013-11-25 DIAGNOSIS — I10 Essential (primary) hypertension: Secondary | ICD-10-CM

## 2013-11-25 DIAGNOSIS — Z13820 Encounter for screening for osteoporosis: Secondary | ICD-10-CM

## 2013-11-25 LAB — COMPLETE METABOLIC PANEL WITH GFR
ALT: 15 U/L (ref 0–35)
AST: 13 U/L (ref 0–37)
Albumin: 3.9 g/dL (ref 3.5–5.2)
Alkaline Phosphatase: 70 U/L (ref 39–117)
BUN: 14 mg/dL (ref 6–23)
CO2: 29 mEq/L (ref 19–32)
Calcium: 9.1 mg/dL (ref 8.4–10.5)
Chloride: 103 mEq/L (ref 96–112)
Creat: 0.88 mg/dL (ref 0.50–1.10)
GFR, Est African American: 79 mL/min
GFR, Est Non African American: 69 mL/min
Glucose, Bld: 108 mg/dL — ABNORMAL HIGH (ref 70–99)
Potassium: 3.5 mEq/L (ref 3.5–5.3)
Sodium: 139 mEq/L (ref 135–145)
Total Bilirubin: 0.5 mg/dL (ref 0.2–1.2)
Total Protein: 6.8 g/dL (ref 6.0–8.3)

## 2013-11-25 LAB — POCT GLYCOSYLATED HEMOGLOBIN (HGB A1C): Hemoglobin A1C: 7

## 2013-11-25 LAB — GLUCOSE, CAPILLARY: Glucose-Capillary: 131 mg/dL — ABNORMAL HIGH (ref 70–99)

## 2013-11-25 MED ORDER — TERBINAFINE HCL 250 MG PO TABS: 250.0000 mg | ORAL_TABLET | Freq: Every day | ORAL | Status: DC

## 2013-11-25 NOTE — Patient Instructions (Signed)
General Instructions: Please follow up with [name] or [facility] for your repeat colonoscopy, we will try to help you arrange an appointment.    For your toenail fungus please start taking Terbinafine 250mg  a day (continue this for 6 weeks) and then you need to come in for repeat lab work, if it is still not resolved call me and I will give you a refill for another 6 weeks of therapy (it sometimes takes 12 weeks).  Please continue to exercise and watch what you eat.  That way we will not need to increase your medications.  I have also ordered you a DEXA scan and Mammogram for screening for osteoporosis and for breast cancer.  Please bring your medicines with you each time you come to clinic.  Medicines may include prescription medications, over-the-counter medications, herbal remedies, eye drops, vitamins, or other pills.   Progress Toward Treatment Goals:  Treatment Goal 11/25/2013  Hemoglobin A1C deteriorated  Blood pressure at goal    Self Care Goals & Plans:  Self Care Goal 11/25/2013  Manage my medications take my medicines as prescribed; bring my medications to every visit; refill my medications on time  Monitor my health keep track of my blood glucose; bring my glucose meter and log to each visit  Eat healthy foods eat more vegetables; eat foods that are low in salt; eat baked foods instead of fried foods  Be physically active find an activity I enjoy  Other walking at [place] treatment goals -    Home Blood Glucose Monitoring 11/25/2013  Check my blood sugar no home glucose monitoring     Care Management & Community Referrals:  Referral 04/23/2013  Referrals made for care management support nutritionist; diabetes educator  Referrals made to community resources nutrition

## 2013-11-26 NOTE — Progress Notes (Signed)
Medicine attending: Medical history, presenting problems, physical findings, and medications, reviewed with resident physician Dr. Johnnette Gourd and I concur with his evaluation and management plan. We discussed use of a short-term oral antifungal drug for onychomycosis of her toenails. We will monitor her liver functions while on this drug and reevaluate her clinically at a short interval. Murriel Hopper, M.D., [facility]

## 2013-11-29 NOTE — Progress Notes (Signed)
[name] Subjective:   [name] female   DOB: 1946-01-02 68 y.o.   MRN: [id number]  HPI: [name] is a 68 y.o. female with a PMH significant for HTN, RAS s/p stenting in 2012, T2DM, HLD, CVA, CAD.  She reports she has not been as active at the [facility] lately but is going to try to go back into her previous routine which is walking around the track at the [facility] and the mall.  Her weight is up about 15 lbs from last visit.  Since our last visit she has completed a repeat sleep study which showed only mild OSA and did not meet criteria for CPAP.  She report otherwise she has been compliant with her medications and is feeling well.  She does report that her right 1st toe is "ugly" and asks what treatment options she may have.  Past Medical History  Diagnosis Date  . Cervical polyp   . Neck mass   . Cervical lymphadenopathy   . Urinary tract infection   . Hyperlipidemia     takes Crestor daily  . Coronary artery disease     Prior stent of the lCX with chronic total occlusion of a small RCA. Last cath in 2004 showing patent stent, nonobstructive disease and total RCA. EF is 65 to 70%. She has been managed medically.   . Tubular adenoma of rectum 10/24/04  . Diabetes mellitus without complication     takes Metformin daily  . Hypothyroidism     takes SYnthroid daily  . Hypertension     takes Amlodipine,Coreg,and Prinzide daily  . TIA (transient ischemic attack)   . Back pain     reason unknown  . History of colon polyps   . Depression     sees a Social worker at [facility] but no meds  . Umbilical hernia   . OSA (obstructive sleep apnea)   . PTSD (post-traumatic stress disorder)   . Renal artery stenosis    Current Outpatient Prescriptions  Medication Sig Dispense Refill  . amLODipine (NORVASC) 10 MG tablet Take 10 mg by mouth daily.      Marland Kitchen aspirin 325 MG tablet Take 325 mg by mouth daily.        . carvedilol (COREG) 25 MG tablet TAKE  ONE-HALF TABLET BY MOUTH TWICE DAILY  30 tablet  2  . levothyroxine (SYNTHROID, LEVOTHROID) 125 MCG tablet TAKE ONE TABLET BY MOUTH ONCE DAILY BEFORE BREAKFAST  30 tablet  0  . lisinopril-hydrochlorothiazide (PRINZIDE,ZESTORETIC) 20-12.5 MG per tablet Take 1 tablet by mouth 2 (two) times daily.  60 tablet  5  . metFORMIN (GLUCOPHAGE) 500 MG tablet Take 1 tablet (500 mg total) by mouth daily with breakfast.  60 tablet  11  . nitroGLYCERIN (NITROSTAT) 0.4 MG SL tablet Place 0.4 mg under the tongue every 5 (five) minutes as needed for chest pain. Up to 3 doses      . oxyCODONE-acetaminophen (PERCOCET/ROXICET) 5-325 MG per tablet Take 1 tablet by mouth every 4 (four) hours as needed for severe pain.  40 tablet  0  . rosuvastatin (CRESTOR) 10 MG tablet Take 1 tablet (10 mg total) by mouth at bedtime.  30 tablet  11  . terbinafine (LAMISIL) 250 MG tablet Take 1 tablet (250 mg total) by mouth daily.  42 tablet  0   No current facility-administered medications for this visit.   Family History  Problem Relation Age of Onset  . Hypertension Mother   .  Osteoarthritis Mother   . Alzheimer's disease Father   . Cancer Neg Hx   . Stroke Neg Hx    History   Social History  . Marital Status: Single    Spouse Name: N/A    Number of Children: N/A  . Years of Education: N/A   Social History Main Topics  . Smoking status: Former Research scientist (life sciences)  . Smokeless tobacco: None     Comment: quit smoking about 30 yrs ago  . Alcohol Use: Yes     Comment: Wine on special occasions  . Drug Use: No  . Sexual Activity: None   Other Topics Concern  . None   Social History Narrative  . None   Review of Systems: Review of Systems  Constitutional: Negative for fever, chills, weight loss and malaise/fatigue.  Eyes: Negative for blurred vision.  Respiratory: Negative for cough and shortness of breath.   Cardiovascular: Negative for chest pain and leg swelling.  Gastrointestinal: Negative for heartburn and abdominal  pain.  Genitourinary: Negative for dysuria.  Musculoskeletal: Negative for myalgias.  Skin: Negative for rash.  Neurological: Negative for dizziness and headaches.  Psychiatric/Behavioral: Negative for depression.     Objective:  Physical Exam: Filed Vitals:   11/25/13 1404  BP: 138/65  Pulse: 68  Temp: 98.4 F (36.9 C)  TempSrc: Oral  Height: 5' 4.5" (1.638 m)  Weight: 219 lb 4.8 oz (99.474 kg)  SpO2: 98%  Physical Exam  Nursing note and vitals reviewed. Constitutional: She is well-developed, well-nourished, and in no distress.  Cardiovascular: Normal rate, regular rhythm and intact distal pulses.   No murmur heard. Pulmonary/Chest: Effort normal and breath sounds normal.  Abdominal: Soft. Bowel sounds are normal.  Musculoskeletal: She exhibits no edema.  Neurological:  Monofilament exam completed with normal sensation of bilateral feet, 2+ dp and pt pulses bilaterally  Skin:  Right great toe onychomycosis    Assessment & Plan:  Case discussed with Dr. Beryle Beams See Problem Based Assessment and Plan Medications Ordered Meds ordered this encounter  Medications  . terbinafine (LAMISIL) 250 MG tablet    Sig: Take 1 tablet (250 mg total) by mouth daily.    Dispense:  42 tablet    Refill:  0   Other Orders Orders Placed This Encounter  Procedures  . DG Bone Density    Standing Status: Future     Number of Occurrences:      Standing Expiration Date: 01/26/2015    Order Specific Question:  Reason for Exam (SYMPTOM  OR DIAGNOSIS REQUIRED)    Answer:  osteoprosis screening    Order Specific Question:  Preferred imaging location?    Answer:  [facility]    Standing Status: Future     Number of Occurrences:      Standing Expiration Date: 01/26/2015    Order Specific Question:  Reason for Exam (SYMPTOM  OR DIAGNOSIS REQUIRED)    Answer:  routine screening for breast cancer    Order Specific Question:  Preferred imaging  location?    Answer:  [facility]  . Glucose, capillary  . CMP with Estimated GFR (CPT-80053)  . CMP with Estimated GFR (CPT-80053)    Standing Status: Future     Number of Occurrences:      Standing Expiration Date: 11/26/2014  . Ambulatory referral to Gastroenterology    Referral Priority:  Routine    Referral Type:  Consultation    Referral Reason:  Specialty Services Required  Requested Specialty:  Gastroenterology    Number of Visits Requested:  1  . POCT glycosylated hemoglobin (Hb A1C)  . HM DIABETES FOOT EXAM

## 2013-11-29 NOTE — Assessment & Plan Note (Signed)
BP Readings from Last 3 Encounters:  11/25/13 138/65  05/27/13 126/74  05/27/13 126/74    Lab Results  Component Value Date   NA 139 11/25/2013   K 3.5 11/25/2013   CREATININE 0.88 11/25/2013    Assessment: Blood pressure control: controlled Progress toward BP goal:  at goal Comments:   Plan: Medications:  Continue Amlodipine 10mg  daily, Lisinopril HCTZ 20-12.5 BID, Coreg 25mg  BID Educational resources provided:   Self management tools provided:   Other plans: CMP today

## 2013-11-29 NOTE — Assessment & Plan Note (Signed)
Last colonoscopy in 2006 with Tuberous Adenoma with [name].  He has previously mentioned that she was a difficult colonoscopy and may be a candidate for virtual Colonoscopy.  Will refer back to [place].

## 2013-11-29 NOTE — Assessment & Plan Note (Signed)
Lab Results  Component Value Date   HGBA1C 7.0 11/25/2013   HGBA1C 6.6 04/23/2013   HGBA1C 6.2 10/16/2012     Assessment: Diabetes control: good control (HgbA1C at goal) Progress toward A1C goal:  deteriorated Comments: still controlled but increased likely secondary to weight gain  Plan: Medications: continue metformin 500mg  daily Home glucose monitoring: Frequency: no home glucose monitoring Timing:   Instruction/counseling given: reminded to get eye exam, reminded to bring medications to each visit and discussed the need for weight loss Educational resources provided:   Self management tools provided:   Other plans: Patient will try to lose weight.  May need to increase metformin at next visit.

## 2013-11-29 NOTE — Assessment & Plan Note (Signed)
-  Patient doing well on Crestor 10mg  daily

## 2013-11-29 NOTE — Assessment & Plan Note (Signed)
Patient reports desire to treat her onychomycosis.  I discussed the treatment options of external creams and oral medications and side effects.  She reports desire for oral treatment. - Check LFTs with CMP - Rx Terbinafine 250mg  daily x 6 weeks - Repeat CMP in 6 weeks if LFTs wnl and treat additional 6 weeks if needed.  (Future order placed for CMP)

## 2013-11-30 ENCOUNTER — Encounter: Payer: Self-pay | Admitting: Internal Medicine

## 2013-12-12 ENCOUNTER — Other Ambulatory Visit: Payer: Self-pay | Admitting: Internal Medicine

## 2013-12-17 ENCOUNTER — Ambulatory Visit (AMBULATORY_SURGERY_CENTER): Payer: Self-pay | Admitting: *Deleted

## 2013-12-17 VITALS — Ht 65.0 in | Wt 220.0 lb

## 2013-12-17 DIAGNOSIS — Z86010 Personal history of colonic polyps: Secondary | ICD-10-CM

## 2013-12-17 NOTE — Progress Notes (Signed)
No egg or soy allergy  No anesthesia or intubation problems per pt  No diet medications taken  Registered in EMMI   

## 2013-12-24 ENCOUNTER — Telehealth: Payer: Self-pay | Admitting: Internal Medicine

## 2013-12-24 NOTE — Telephone Encounter (Signed)
Patient advised to pick up Miralax and dulcolax over the counter.  She verbalized understanding

## 2014-01-03 ENCOUNTER — Encounter: Payer: Self-pay | Admitting: Internal Medicine

## 2014-01-03 ENCOUNTER — Ambulatory Visit (AMBULATORY_SURGERY_CENTER): Payer: Medicare Other | Admitting: Internal Medicine

## 2014-01-03 VITALS — BP 148/83 | HR 61 | Temp 97.9°F | Resp 22 | Ht 64.5 in | Wt 219.0 lb

## 2014-01-03 DIAGNOSIS — K635 Polyp of colon: Secondary | ICD-10-CM

## 2014-01-03 DIAGNOSIS — D123 Benign neoplasm of transverse colon: Secondary | ICD-10-CM

## 2014-01-03 DIAGNOSIS — Z86010 Personal history of colonic polyps: Secondary | ICD-10-CM

## 2014-01-03 LAB — GLUCOSE, CAPILLARY
Glucose-Capillary: 92 mg/dL (ref 70–99)
Glucose-Capillary: 110 mg/dL — ABNORMAL HIGH (ref 70–99)

## 2014-01-03 MED ORDER — SODIUM CHLORIDE 0.9 % IV SOLN: 500.0000 mL | INTRAVENOUS | Status: DC

## 2014-01-03 NOTE — Progress Notes (Signed)
Called to room to assist during endoscopic procedure.  Patient ID and intended procedure confirmed with present staff. Received instructions for my participation in the procedure from the performing physician.  

## 2014-01-03 NOTE — Op Note (Signed)
[facility]  [address] [place], [address]   COLONOSCOPY PROCEDURE REPORT  PATIENT: Teresa, [name]  MR#: [id number] BIRTHDATE: Jun 30, 1945 , 60  yrs. old GENDER: female ENDOSCOPIST: Gatha Mayer, MD, [facility] PROCEDURE DATE:  01/03/2014 PROCEDURE:   Colonoscopy with snare polypectomy First Screening Colonoscopy - Avg.  risk and is 50 yrs.  old or older - No.  Prior Negative Screening - Now for repeat screening. N/A  History of Adenoma - Now for follow-up colonoscopy & has been > or = to 3 yrs.  Yes hx of adenoma.  Has been 3 or more years since last colonoscopy.  Polyps Removed Today? Yes. ASA CLASS:   Class II INDICATIONS:surveillance colonoscopy based on a history of adenomatous colonic polyp(s). MEDICATIONS: Propofol 250 mg IV and Monitored anesthesia care  DESCRIPTION OF PROCEDURE:   After the risks benefits and alternatives of the procedure were thoroughly explained, informed consent was obtained.  The digital rectal exam revealed no abnormalities of the rectum.   The LB [id number]  endoscope was introduced through the anus and advanced to the cecum, which was identified by both the appendix and ileocecal valve. No adverse events experienced.   The quality of the prep was good, using MiraLax  The instrument was then slowly withdrawn as the colon was fully examined.      COLON FINDINGS: 1) diminutive transverse colon polyp removed completely with cold snare - sent to pathology 2) sigmoid diverticulosis 3) otherwise normal colonoscopy.  Retroflexed views revealed no abnormalities. The time to cecum=4 minutes 52 seconds. Withdrawal time=10 minutes 35 seconds.  The scope was withdrawn and the procedure completed. COMPLICATIONS: There were no immediate complications.  ENDOSCOPIC IMPRESSION: 1) diminutive transverse colon polyp removed 2) sigmoid diverticulosis 3) otherwise normal colonoscopy  RECOMMENDATIONS: Timing of repeat colonoscopy will be  determined by pathology findings in patient with prior advanced adenoma of rectum (2006)  eSigned:  Gatha Mayer, MD, [facility] 01/03/2014 2:44 PM   cc: The Patient

## 2014-01-03 NOTE — Patient Instructions (Addendum)
I found and removed one tiny polyp that looks benign. I will let you know pathology results and when to have another routine colonoscopy by mail.  I appreciate the opportunity to care for you. Gatha Mayer, MD, FACG  YOU HAD AN ENDOSCOPIC PROCEDURE TODAY AT [facility]: Refer to the procedure report that was given to you for any specific questions about what was found during the examination.  If the procedure report does not answer your questions, please call your gastroenterologist to clarify.  If you requested that your care partner not be given the details of your procedure findings, then the procedure report has been included in a sealed envelope for you to review at your convenience later.  YOU SHOULD EXPECT: Some feelings of bloating in the abdomen. Passage of more gas than usual.  Walking can help get rid of the air that was put into your GI tract during the procedure and reduce the bloating. If you had a lower endoscopy (such as a colonoscopy or flexible sigmoidoscopy) you may notice spotting of blood in your stool or on the toilet paper. If you underwent a bowel prep for your procedure, then you may not have a normal bowel movement for a few days.  DIET: Your first meal following the procedure should be a light meal and then it is ok to progress to your normal diet.  A half-sandwich or bowl of soup is an example of a good first meal.  Heavy or fried foods are harder to digest and may make you feel nauseous or bloated.  Likewise meals heavy in dairy and vegetables can cause extra gas to form and this can also increase the bloating.  Drink plenty of fluids but you should avoid alcoholic beverages for 24 hours.  ACTIVITY: Your care partner should take you home directly after the procedure.  You should plan to take it easy, moving slowly for the rest of the day.  You can resume normal activity the day after the procedure however you should NOT DRIVE or use heavy  machinery for 24 hours (because of the sedation medicines used during the test).    SYMPTOMS TO REPORT IMMEDIATELY: A gastroenterologist can be reached at any hour.  During normal business hours, 8:30 AM to 5:00 PM Monday through Friday, call [phone].  After hours and on weekends, please call the GI answering service at [phone] who will take a message and have the physician on call contact you.   Following lower endoscopy (colonoscopy or flexible sigmoidoscopy):  Excessive amounts of blood in the stool  Significant tenderness or worsening of abdominal pains  Swelling of the abdomen that is new, acute  Fever of 100F or higher  FOLLOW UP: If any biopsies were taken you will be contacted by phone or by letter within the next 1-3 weeks.  Call your gastroenterologist if you have not heard about the biopsies in 3 weeks.  Our staff will call the home number listed on your records the next business day following your procedure to check on you and address any questions or concerns that you may have at that time regarding the information given to you following your procedure. This is a courtesy call and so if there is no answer at the home number and we have not heard from you through the emergency physician on call, we will assume that you have returned to your regular daily activities without incident.  SIGNATURES/CONFIDENTIALITY: You and/or your care partner have  signed paperwork which will be entered into your electronic medical record.  These signatures attest to the fact that that the information above on your After Visit Summary has been reviewed and is understood.  Full responsibility of the confidentiality of this discharge information lies with you and/or your care-partner.  Please, read all of the handouts given to you by your recovery room nurse.

## 2014-01-03 NOTE — Progress Notes (Signed)
Pt stable to RR 

## 2014-01-04 ENCOUNTER — Telehealth: Payer: Self-pay

## 2014-01-04 NOTE — Telephone Encounter (Signed)
  Follow up Call-  Call back number 01/03/2014  Post procedure Call Back phone  # [phone]  Permission to leave phone message Yes     Patient questions:  Do you have a fever, pain , or abdominal swelling? No. Pain Score  0 *  Have you tolerated food without any problems? Yes.    Have you been able to return to your normal activities? Yes.    Do you have any questions about your discharge instructions: Diet   No. Medications  No. Follow up visit  No.  Do you have questions or concerns about your Care? No.  Actions: * If pain score is 4 or above: No action needed, pain <4.

## 2014-01-10 ENCOUNTER — Encounter: Payer: Self-pay | Admitting: Internal Medicine

## 2014-01-10 NOTE — Progress Notes (Signed)
Quick Note:  CORRECTION - repeat colonoscopy 2020-21 ______

## 2014-01-10 NOTE — Progress Notes (Signed)
Quick Note:  Mucosal polyp Repeat colonoscopy 2018-19 due to rior advanced adenoma ______

## 2014-01-19 ENCOUNTER — Other Ambulatory Visit (HOSPITAL_COMMUNITY): Payer: Self-pay | Admitting: *Deleted

## 2014-01-19 DIAGNOSIS — I6523 Occlusion and stenosis of bilateral carotid arteries: Secondary | ICD-10-CM

## 2014-01-25 ENCOUNTER — Other Ambulatory Visit: Payer: Self-pay | Admitting: Cardiovascular Disease

## 2014-01-31 ENCOUNTER — Other Ambulatory Visit (INDEPENDENT_AMBULATORY_CARE_PROVIDER_SITE_OTHER): Payer: Medicare Other

## 2014-01-31 ENCOUNTER — Ambulatory Visit (HOSPITAL_COMMUNITY): Payer: Medicare Other | Attending: Internal Medicine | Admitting: Cardiology

## 2014-01-31 DIAGNOSIS — I6523 Occlusion and stenosis of bilateral carotid arteries: Secondary | ICD-10-CM | POA: Insufficient documentation

## 2014-01-31 DIAGNOSIS — E785 Hyperlipidemia, unspecified: Secondary | ICD-10-CM | POA: Insufficient documentation

## 2014-01-31 DIAGNOSIS — Z8673 Personal history of transient ischemic attack (TIA), and cerebral infarction without residual deficits: Secondary | ICD-10-CM | POA: Diagnosis not present

## 2014-01-31 DIAGNOSIS — I10 Essential (primary) hypertension: Secondary | ICD-10-CM | POA: Insufficient documentation

## 2014-01-31 DIAGNOSIS — Z87891 Personal history of nicotine dependence: Secondary | ICD-10-CM | POA: Diagnosis not present

## 2014-01-31 DIAGNOSIS — I739 Peripheral vascular disease, unspecified: Secondary | ICD-10-CM | POA: Insufficient documentation

## 2014-01-31 DIAGNOSIS — B351 Tinea unguium: Secondary | ICD-10-CM

## 2014-01-31 LAB — COMPLETE METABOLIC PANEL WITH GFR
ALT: 14 U/L (ref 0–35)
AST: 13 U/L (ref 0–37)
Albumin: 4.1 g/dL (ref 3.5–5.2)
Alkaline Phosphatase: 65 U/L (ref 39–117)
BUN: 11 mg/dL (ref 6–23)
CO2: 29 mEq/L (ref 19–32)
Calcium: 9.6 mg/dL (ref 8.4–10.5)
Chloride: 101 mEq/L (ref 96–112)
Creat: 0.89 mg/dL (ref 0.50–1.10)
GFR, Est African American: 78 mL/min
GFR, Est Non African American: 68 mL/min
Glucose, Bld: 136 mg/dL — ABNORMAL HIGH (ref 70–99)
Potassium: 3.9 mEq/L (ref 3.5–5.3)
Sodium: 141 mEq/L (ref 135–145)
Total Bilirubin: 0.6 mg/dL (ref 0.2–1.2)
Total Protein: 7.3 g/dL (ref 6.0–8.3)

## 2014-01-31 NOTE — Progress Notes (Signed)
Carotid duplex performed 

## 2014-02-03 ENCOUNTER — Ambulatory Visit: Payer: Self-pay | Admitting: Cardiovascular Disease

## 2014-03-05 ENCOUNTER — Other Ambulatory Visit: Payer: Self-pay | Admitting: Cardiovascular Disease

## 2014-03-09 ENCOUNTER — Other Ambulatory Visit: Payer: Self-pay | Admitting: *Deleted

## 2014-03-09 DIAGNOSIS — B351 Tinea unguium: Secondary | ICD-10-CM

## 2014-03-10 MED ORDER — TERBINAFINE HCL 250 MG PO TABS: 250.0000 mg | ORAL_TABLET | Freq: Every day | ORAL | Status: DC

## 2014-04-14 ENCOUNTER — Ambulatory Visit: Payer: Self-pay | Admitting: Cardiovascular Disease

## 2014-04-18 ENCOUNTER — Other Ambulatory Visit: Payer: Self-pay | Admitting: *Deleted

## 2014-04-18 MED ORDER — CARVEDILOL 25 MG PO TABS: 12.5000 mg | ORAL_TABLET | Freq: Two times a day (BID) | ORAL | Status: DC

## 2014-05-04 ENCOUNTER — Encounter: Payer: Self-pay | Admitting: Cardiovascular Disease

## 2014-05-04 ENCOUNTER — Ambulatory Visit (INDEPENDENT_AMBULATORY_CARE_PROVIDER_SITE_OTHER): Payer: Medicare Other | Admitting: Cardiovascular Disease

## 2014-05-04 VITALS — BP 136/80 | HR 65 | Ht 64.5 in | Wt 214.0 lb

## 2014-05-04 DIAGNOSIS — I2510 Atherosclerotic heart disease of native coronary artery without angina pectoris: Secondary | ICD-10-CM

## 2014-05-04 DIAGNOSIS — I10 Essential (primary) hypertension: Secondary | ICD-10-CM | POA: Diagnosis not present

## 2014-05-04 NOTE — Patient Instructions (Signed)
Your physician recommends that you continue on your current medications as directed. Please refer to the Current Medication list given to you today.  Your physician wants you to follow-up in: 1 YEAR with Dr Cooper.  You will receive a reminder letter in the mail two months in advance. If you don't receive a letter, please call our office to schedule the follow-up appointment.  

## 2014-05-04 NOTE — Progress Notes (Signed)
Cardiology Office Note   Date:  05/04/2014   ID:  [name], [name] [birth year], MRN [id number]  PCP:  Lucious Groves, DO  Cardiologist:  Sherren Mocha, MD    No chief complaint on file.    History of Present Illness: [name] is a 69 y.o. female who presents for evaluation. She is followed for hypertension, renal artery stenosis, and coronary artery disease status post PCI left circumflex. She underwent right renal artery stenting in 2007.   She's still working at [employer]. Having stress with that. Feels like it's time to retire, as the stress of caring for small children has become a lot for her.   No recent problems with chest pain or shortness of breath. Took one SL NTG a few weeks back with indigestion but this didn't help. Then took and Alka-seltzer Plus and symptoms resolved. She's been doing Silver Sneakers but hasn't really started much exercise. Needs a note saying she can participate. Primarily limited by back pain at present. 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 No orthopnea, PND, or edema.  Past Medical History  Diagnosis Date  . Cervical polyp   . Neck mass   . Cervical lymphadenopathy   . Urinary tract infection   . Hyperlipidemia     takes Crestor daily  . Coronary artery disease     Prior stent of the lCX with chronic total occlusion of a small RCA. Last cath in 2004 showing patent stent, nonobstructive disease and total RCA. EF is 65 to 70%. She has been managed medically.   . Tubular adenoma of rectum 10/24/04  . Diabetes mellitus without complication     takes Metformin daily  . Hypothyroidism     takes SYnthroid daily  . Hypertension     takes Amlodipine,Coreg,and Prinzide daily  . TIA (transient ischemic attack)   . Back pain     reason unknown  . History of colon polyps   . Depression     sees a Social worker at [facility] but no meds  . Umbilical hernia   . OSA (obstructive sleep apnea)     PT DENIES, STATES SHE WAS TOLD SHE DIDN'T HAVE OSA,  NO CPAP  . PTSD (post-traumatic stress disorder)   . Renal artery stenosis   . Stroke     TIA- LAST ONE GREATER THAN 10 YEARS PER PT    Past Surgical History  Procedure Laterality Date  . Cardiac catheterization  2005/2006/2007  . Colonoscopy  10/2004  . Appendectomy    . Ankle surgery Left     plates and screws  . Coronary angioplasty      x 1  . Umbilical hernia repair    . Umbilical hernia repair N/A 05/27/2013    Procedure: HERNIA REPAIR UMBILICAL ADULT;  Surgeon: Imogene Burn. Georgette Dover, MD;  Location: [facility];  Service: General;  Laterality: N/A;  . Insertion of mesh N/A 05/27/2013    Procedure: INSERTION OF MESH;  Surgeon: Imogene Burn. Georgette Dover, MD;  Location: [facility];  Service: General;  Laterality: N/A;    Current Outpatient Prescriptions  Medication Sig Dispense Refill  . amLODipine (NORVASC) 10 MG tablet Take 10 mg by mouth daily.    Marland Kitchen aspirin 325 MG tablet Take 325 mg by mouth daily.      . carvedilol (COREG) 25 MG tablet Take 0.5 tablets (12.5 mg total) by mouth 2 (two) times daily. 30 tablet 0  . levothyroxine (SYNTHROID, LEVOTHROID) 125 MCG tablet TAKE ONE TABLET BY  MOUTH ONCE DAILY BEFORE BREAKFAST 30 tablet 5  . lisinopril-hydrochlorothiazide (PRINZIDE,ZESTORETIC) 20-12.5 MG per tablet Take 1 tablet by mouth 2 (two) times daily. 60 tablet 5  . metFORMIN (GLUCOPHAGE) 500 MG tablet Take 1 tablet (500 mg total) by mouth daily with breakfast. 60 tablet 11  . nitroGLYCERIN (NITROSTAT) 0.4 MG SL tablet Place 0.4 mg under the tongue every 5 (five) minutes as needed for chest pain. Up to 3 doses    . rosuvastatin (CRESTOR) 10 MG tablet Take 1 tablet (10 mg total) by mouth at bedtime. 30 tablet 11   No current facility-administered medications for this visit.    Allergies:   Review of patient's allergies indicates no known allergies.   Social History:  The patient  reports that she has quit smoking. She has never used smokeless tobacco. She reports that she drinks alcohol. She reports  that she does not use illicit drugs.   Family History:  The patient's family history includes Alzheimer's disease in her father; Hypertension in her mother; Osteoarthritis in her mother. There is no history of Cancer, Stroke, Colon cancer, Esophageal cancer, Stomach cancer, or Rectal cancer.    ROS:  Please see the history of present illness.  Otherwise, review of systems is positive for back pain.  All other systems are reviewed and negative.    PHYSICAL EXAM: VS:  BP 136/80 mmHg  Pulse 65  Ht 5' 4.5" (1.638 m)  Wt 214 lb (97.07 kg)  BMI 36.18 kg/m2 , BMI Body mass index is 36.18 kg/(m^2). GEN: Well nourished, well developed, in no acute distress HEENT: normal Neck: no JVD, no masses. No carotid bruits Cardiac: RRR without murmur or gallop                Respiratory:  clear to auscultation bilaterally, normal work of breathing GI: soft, nontender, nondistended, + BS MS: no deformity or atrophy Ext: no pretibial edema, pedal pulses 2+= bilaterally Skin: warm and dry, no rash Neuro:  Strength and sensation are intact Psych: euthymic mood, full affect  EKG:  EKG is not ordered today. The ekg ordered today shows NSR, first degree AV block, heart rate 65 bpm, T wave abnormality consider lateral ischemia. No change from previous tracing.  Recent Labs: 05/21/2013: Hemoglobin 12.1; Platelets 289 01/31/2014: ALT 14; BUN 11; Creatinine 0.89; Potassium 3.9; Sodium 141   Lipid Panel     Component Value Date/Time   CHOL 123 04/23/2013 1154   TRIG 153* 04/23/2013 1154   HDL 28* 04/23/2013 1154   CHOLHDL 4.4 04/23/2013 1154   VLDL 31 04/23/2013 1154   LDLCALC 64 04/23/2013 1154   LDLDIRECT 196.8 07/11/2010 1107      Wt Readings from Last 3 Encounters:  05/04/14 214 lb (97.07 kg)  01/03/14 219 lb (99.338 kg)  12/17/13 220 lb (99.791 kg)     ASSESSMENT AND PLAN: 1. Coronary artery disease, native vessel. The patient is stable without symptoms of angina. She will continue on her  current medical program. We wrote her a note that she could participate in the Silver sneakers exercise program.  2. Hypertension: Blood pressure is well controlled on multi drug therapy. Last labs reviewed show stable renal function.  3. Renal artery stenosis. Continue with clinical followup. Creatinine has been stable. Blood pressure is controlled.  4. Hyperlipidemia. Last lipids reviewed. She continues on Crestor.  Current medicines are reviewed with the patient today.  The patient does not have concerns regarding medicines.  The following changes have been made:  no change  Labs/ tests ordered today include:   Orders Placed This Encounter  Procedures  . EKG 12-Lead    Disposition:   FU one year  Signed, Sherren Mocha, MD  05/04/2014 1:32 PM    [facility] [address], [place], [place]  [address] Phone: [phone]; Fax: [phone]

## 2014-05-05 ENCOUNTER — Encounter: Payer: Self-pay | Admitting: Internal Medicine

## 2014-05-23 ENCOUNTER — Other Ambulatory Visit: Payer: Self-pay | Admitting: Internal Medicine

## 2014-05-23 ENCOUNTER — Other Ambulatory Visit: Payer: Self-pay | Admitting: Cardiovascular Disease

## 2014-06-09 ENCOUNTER — Encounter: Payer: Self-pay | Admitting: Internal Medicine

## 2014-06-15 ENCOUNTER — Other Ambulatory Visit: Payer: Self-pay | Admitting: Internal Medicine

## 2014-06-16 ENCOUNTER — Other Ambulatory Visit: Payer: Self-pay | Admitting: Internal Medicine

## 2014-06-30 ENCOUNTER — Encounter: Payer: Self-pay | Admitting: Internal Medicine

## 2014-07-06 ENCOUNTER — Other Ambulatory Visit: Payer: Self-pay | Admitting: *Deleted

## 2014-07-06 MED ORDER — AMLODIPINE BESYLATE 10 MG PO TABS: 10.0000 mg | ORAL_TABLET | Freq: Every day | ORAL | Status: DC

## 2014-07-07 ENCOUNTER — Encounter: Payer: Self-pay | Admitting: Internal Medicine

## 2014-07-07 ENCOUNTER — Ambulatory Visit (INDEPENDENT_AMBULATORY_CARE_PROVIDER_SITE_OTHER): Payer: Medicare Other | Admitting: Internal Medicine

## 2014-07-07 VITALS — BP 182/88 | HR 57 | Temp 98.5°F | Ht 64.5 in | Wt 216.5 lb

## 2014-07-07 DIAGNOSIS — E785 Hyperlipidemia, unspecified: Secondary | ICD-10-CM | POA: Diagnosis not present

## 2014-07-07 DIAGNOSIS — E039 Hypothyroidism, unspecified: Secondary | ICD-10-CM

## 2014-07-07 DIAGNOSIS — I10 Essential (primary) hypertension: Secondary | ICD-10-CM | POA: Diagnosis not present

## 2014-07-07 DIAGNOSIS — E119 Type 2 diabetes mellitus without complications: Secondary | ICD-10-CM

## 2014-07-07 DIAGNOSIS — Z0000 Encounter for general adult medical examination without abnormal findings: Secondary | ICD-10-CM

## 2014-07-07 LAB — BASIC METABOLIC PANEL WITH GFR
BUN: 11 mg/dL (ref 6–23)
CO2: 29 mEq/L (ref 19–32)
Calcium: 8.7 mg/dL (ref 8.4–10.5)
Chloride: 102 mEq/L (ref 96–112)
Creat: 0.89 mg/dL (ref 0.50–1.10)
GFR, Est African American: 78 mL/min
GFR, Est Non African American: 68 mL/min
Glucose, Bld: 165 mg/dL — ABNORMAL HIGH (ref 70–99)
Potassium: 3.7 mEq/L (ref 3.5–5.3)
Sodium: 140 mEq/L (ref 135–145)

## 2014-07-07 LAB — LIPID PANEL
Cholesterol: 183 mg/dL (ref 0–200)
HDL: 32 mg/dL — ABNORMAL LOW (ref >=46)
LDL Cholesterol: 128 mg/dL — ABNORMAL HIGH (ref 0–99)
Total CHOL/HDL Ratio: 5.7 Ratio
Triglycerides: 113 mg/dL (ref <150)
VLDL: 23 mg/dL (ref 0–40)

## 2014-07-07 LAB — POCT GLYCOSYLATED HEMOGLOBIN (HGB A1C): Hemoglobin A1C: 6.9

## 2014-07-07 LAB — HM DIABETES EYE EXAM

## 2014-07-07 LAB — GLUCOSE, CAPILLARY: Glucose-Capillary: 149 mg/dL — ABNORMAL HIGH (ref 65–99)

## 2014-07-07 MED ORDER — METFORMIN HCL ER 500 MG PO TB24: 500.0000 mg | ORAL_TABLET | Freq: Every day | ORAL | Status: DC

## 2014-07-07 MED ORDER — LISINOPRIL-HYDROCHLOROTHIAZIDE 20-12.5 MG PO TABS: 1.0000 | ORAL_TABLET | Freq: Two times a day (BID) | ORAL | Status: DC

## 2014-07-07 MED ORDER — ROSUVASTATIN CALCIUM 10 MG PO TABS: 10.0000 mg | ORAL_TABLET | Freq: Every day | ORAL | Status: DC

## 2014-07-07 NOTE — Patient Instructions (Signed)
General Instructions:  Please resume taking Metformin  Thank you for bringing your medicines today. This helps [place] keep you safe from mistakes.   Progress Toward Treatment Goals:  Treatment Goal 07/07/2014  Hemoglobin A1C at goal  Blood pressure deteriorated    Self Care Goals & Plans:  Self Care Goal 07/07/2014  Manage my medications take my medicines as prescribed; bring my medications to every visit; refill my medications on time  Monitor my health keep track of my blood glucose; bring my glucose meter and log to each visit  Eat healthy foods eat more vegetables; eat foods that are low in salt; eat baked foods instead of fried foods  Be physically active find an activity I enjoy  Other walking at [place] treatment goals -    Home Blood Glucose Monitoring 07/07/2014  Check my blood sugar no home glucose monitoring     Care Management & Community Referrals:  Referral 07/07/2014  Referrals made for care management support none needed  Referrals made to community resources -

## 2014-07-07 NOTE — Progress Notes (Signed)
[facility] Subjective:   [name] female   DOB: [birth year] 69 y.o.   MRN: [id number]  HPI: [name] is a 69 y.o. female with a PMH detailed below who presents for overdue follow up of her diabetes and hypertension.  DM2: She ran out of metformin and has not been taking anything for diabetes.  Weight is roughly stable. Wt Readings from Last 5 Encounters:  07/07/14 216 lb 8 oz (98.204 kg)  05/04/14 214 lb (97.07 kg)  01/03/14 219 lb (99.338 kg)  12/17/13 220 lb (99.791 kg)  11/25/13 219 lb 4.8 oz (99.474 kg)    HTN: Taking her medications, no issues.   Preventative care: since our last visit she did have a colonoscopy with dr Carlean Purl, revealed 1 mucoid polyp, recommend repeat in 2020-21.   Past Medical History  Diagnosis Date  . Cervical polyp   . Neck mass   . Cervical lymphadenopathy   . Urinary tract infection   . Hyperlipidemia     takes Crestor daily  . Coronary artery disease     Prior stent of the lCX with chronic total occlusion of a small RCA. Last cath in 2004 showing patent stent, nonobstructive disease and total RCA. EF is 65 to 70%. She has been managed medically.   . Tubular adenoma of rectum 10/24/04  . Diabetes mellitus without complication     takes Metformin daily  . Hypothyroidism     takes SYnthroid daily  . Hypertension     takes Amlodipine,Coreg,and Prinzide daily  . TIA (transient ischemic attack)   . Back pain     reason unknown  . History of colon polyps   . Depression     sees a Social worker at [facility] but no meds  . Umbilical hernia   . OSA (obstructive sleep apnea)     PT DENIES, STATES SHE WAS TOLD SHE DIDN'T HAVE OSA, NO CPAP  . PTSD (post-traumatic stress disorder)   . Renal artery stenosis   . Stroke     TIA- LAST ONE GREATER THAN 10 YEARS PER PT   Current Outpatient Prescriptions  Medication Sig Dispense Refill  . amLODipine (NORVASC) 10 MG tablet Take 1 tablet (10 mg total) by mouth  daily. 90 tablet 0  . aspirin 325 MG tablet Take 325 mg by mouth daily.      . carvedilol (COREG) 25 MG tablet TAKE ONE-HALF TABLET BY MOUTH TWICE DAILY 30 tablet 10  . levothyroxine (SYNTHROID, LEVOTHROID) 125 MCG tablet TAKE ONE TABLET BY MOUTH ONCE DAILY BEFORE BREAKFAST 30 tablet 5  . lisinopril-hydrochlorothiazide (PRINZIDE,ZESTORETIC) 20-12.5 MG per tablet Take 1 tablet by mouth 2 (two) times daily. 60 tablet 11  . metFORMIN (GLUCOPHAGE XR) 500 MG 24 hr tablet Take 1 tablet (500 mg total) by mouth daily with breakfast. 30 tablet 11  . nitroGLYCERIN (NITROSTAT) 0.4 MG SL tablet Place 0.4 mg under the tongue every 5 (five) minutes as needed for chest pain. Up to 3 doses    . rosuvastatin (CRESTOR) 10 MG tablet Take 1 tablet (10 mg total) by mouth at bedtime. 30 tablet 11   No current facility-administered medications for this visit.   Family History  Problem Relation Age of Onset  . Hypertension Mother   . Osteoarthritis Mother   . Alzheimer's disease Father   . Cancer Neg Hx   . Stroke Neg Hx   . Colon cancer Neg Hx   . Esophageal cancer Neg Hx   .  Stomach cancer Neg Hx   . Rectal cancer Neg Hx    History   Social History  . Marital Status: Single    Spouse Name: N/A  . Number of Children: N/A  . Years of Education: N/A   Social History Main Topics  . Smoking status: Former Research scientist (life sciences)  . Smokeless tobacco: Never Used     Comment: quit smoking about 30 yrs ago  . Alcohol Use: 0.0 oz/week    0 Standard drinks or equivalent per week     Comment: Wine on special occasions  . Drug Use: No  . Sexual Activity: Not on file   Other Topics Concern  . None   Social History Narrative   Review of Systems: Review of Systems  Constitutional: Negative for fever, chills, weight loss and malaise/fatigue.  HENT: Negative for congestion.   Eyes: Negative for blurred vision.  Respiratory: Negative for cough, sputum production and shortness of breath.   Cardiovascular: Negative for  chest pain, palpitations and leg swelling.  Gastrointestinal: Negative for heartburn, nausea, vomiting, abdominal pain, diarrhea and constipation.  Genitourinary: Negative for dysuria.  Skin: Negative for rash.  Neurological: Negative for dizziness and headaches.  Psychiatric/Behavioral: Negative for depression.     Objective:  Physical Exam: Filed Vitals:   07/07/14 0904  BP: 182/88  Pulse: 57  Temp: 98.5 F (36.9 C)  TempSrc: Oral  Height: 5' 4.5" (1.638 m)  Weight: 216 lb 8 oz (98.204 kg)  SpO2: 99%   Physical Exam  Constitutional: She is well-developed, well-nourished, and in no distress. No distress.  HENT:  Head: Normocephalic and atraumatic.  Eyes: Conjunctivae are normal.  Cardiovascular: Normal rate, regular rhythm, normal heart sounds and intact distal pulses.   No murmur heard. Pulmonary/Chest: Effort normal and breath sounds normal. No respiratory distress. She has no wheezes. She has no rales.  Abdominal: Soft. Bowel sounds are normal. She exhibits no distension. There is no tenderness.  Musculoskeletal: She exhibits no edema.  Skin: Skin is warm and dry. She is not diaphoretic.  Feet inspected, no ulcers or calluses   Psychiatric: Affect and judgment normal.  Nursing note and vitals reviewed.    Assessment & Plan:  Case discussed with Dr. Beryle Beams  Essential hypertension BP Readings from Last 3 Encounters:  07/07/14 182/88  05/04/14 136/80  01/03/14 148/83    Lab Results  Component Value Date   NA 140 07/07/2014   K 3.7 07/07/2014   CREATININE 0.89 07/07/2014    Assessment: Blood pressure control: moderately elevated Progress toward BP goal:  deteriorated  Comments: did not take bp meds this morning, and has been out of amlodipine for a few days.  Plan: Medications:  continue current medications Educational resources provided:   Self management tools provided:   Other plans: previously well controlled, will check BP at home and make early  follow up if BP remains elevated.    Hypothyroidism -Currently asymptomatic on synthroid 173mcg.  No TSH in almost 1 year. - Check TSH>>> 3.6 continue current dose.   Preventative health care - Completed colonoscopy. - Encouraged to complete mammogram and dexa.   Hyperlipidemia  Lab Results  Component Value Date   LDLCALC 128* 07/07/2014   LDLCALC 64 04/23/2013   LDLCALC 107* 06/02/2012  Rechecked Lipid panel, LDL up some to 128, suspect has not been taking daily as she still has a bottle with her with pills (per number of refills should have run out) - Continue Crestor 10mg .  May need to  increase if remains under goal.   Diabetes mellitus type 2, controlled, without complications Lab Results  Component Value Date   HGBA1C 6.9 07/07/2014   HGBA1C 7.0 11/25/2013   HGBA1C 6.6 04/23/2013     Assessment: Diabetes control: good control (HgbA1C at goal) Progress toward A1C goal:  at goal Comments: Has been off metformin  Plan: Medications:  Discussed monitoring off medications or restarting metformin.  Wants to restart Metformin.  Will do ER 500mg  daily Home glucose monitoring: Frequency: no home glucose monitoring Timing:   Instruction/counseling given: reminded to get eye exam, reminded to bring medications to each visit and discussed foot care Educational resources provided:   Self management tools provided:  (DON'T HAVE METER) Other plans:         Medications Ordered Meds ordered this encounter  Medications  . rosuvastatin (CRESTOR) 10 MG tablet    Sig: Take 1 tablet (10 mg total) by mouth at bedtime.    Dispense:  30 tablet    Refill:  11  . lisinopril-hydrochlorothiazide (PRINZIDE,ZESTORETIC) 20-12.5 MG per tablet    Sig: Take 1 tablet by mouth 2 (two) times daily.    Dispense:  60 tablet    Refill:  11  . metFORMIN (GLUCOPHAGE XR) 500 MG 24 hr tablet    Sig: Take 1 tablet (500 mg total) by mouth daily with breakfast.    Dispense:  30 tablet     Refill:  11   Other Orders Orders Placed This Encounter  Procedures  . TSH  . BMP with Estimated GFR (CPT-80048)  . Glucose, capillary  . Lipid Profile  . Microalbumin / Creatinine Urine Ratio  . POC Hbg A1C  . Retinal/fundus photography    Standing Status: Future     Number of Occurrences: 1     Standing Expiration Date: 07/07/2015

## 2014-07-07 NOTE — Progress Notes (Signed)
Medicine attending: Medical history, presenting problems, physical findings, and medications, reviewed with Dr Erik Hoffman and I concur with his evaluation and management plan. 

## 2014-07-08 ENCOUNTER — Encounter: Payer: Self-pay | Admitting: Dietician

## 2014-07-08 LAB — MICROALBUMIN / CREATININE URINE RATIO
Creatinine, Urine: 47.5 mg/dL
Microalb Creat Ratio: 183.2 mg/g — ABNORMAL HIGH (ref 0.0–30.0)
Microalb, Ur: 8.7 mg/dL — ABNORMAL HIGH (ref <2.0)

## 2014-07-08 LAB — TSH: TSH: 3.682 u[IU]/mL (ref 0.350–4.500)

## 2014-07-09 NOTE — Assessment & Plan Note (Signed)
-   Completed colonoscopy. - Encouraged to complete mammogram and dexa.

## 2014-07-09 NOTE — Assessment & Plan Note (Signed)
Lab Results  Component Value Date   HGBA1C 6.9 07/07/2014   HGBA1C 7.0 11/25/2013   HGBA1C 6.6 04/23/2013     Assessment: Diabetes control: good control (HgbA1C at goal) Progress toward A1C goal:  at goal Comments: Has been off metformin  Plan: Medications:  Discussed monitoring off medications or restarting metformin.  Wants to restart Metformin.  Will do ER 500mg  daily Home glucose monitoring: Frequency: no home glucose monitoring Timing:   Instruction/counseling given: reminded to get eye exam, reminded to bring medications to each visit and discussed foot care Educational resources provided:   Self management tools provided:  (DON'T HAVE METER) Other plans:

## 2014-07-09 NOTE — Assessment & Plan Note (Signed)
  Lab Results  Component Value Date   LDLCALC 128* 07/07/2014   LDLCALC 64 04/23/2013   LDLCALC 107* 06/02/2012  Rechecked Lipid panel, LDL up some to 128, suspect has not been taking daily as she still has a bottle with her with pills (per number of refills should have run out) - Continue Crestor 10mg .  May need to increase if remains under goal.

## 2014-07-09 NOTE — Assessment & Plan Note (Addendum)
BP Readings from Last 3 Encounters:  07/07/14 182/88  05/04/14 136/80  01/03/14 148/83    Lab Results  Component Value Date   NA 140 07/07/2014   K 3.7 07/07/2014   CREATININE 0.89 07/07/2014    Assessment: Blood pressure control: moderately elevated Progress toward BP goal:  deteriorated  Comments: did not take bp meds this morning, and has been out of amlodipine for a few days.  Plan: Medications:  continue current medications Educational resources provided:   Self management tools provided:   Other plans: previously well controlled, will check BP at home and make early follow up if BP remains elevated.

## 2014-07-09 NOTE — Assessment & Plan Note (Signed)
-  Currently asymptomatic on synthroid 126mcg.  No TSH in almost 1 year. - Check TSH>>> 3.6 continue current dose.

## 2014-07-12 ENCOUNTER — Encounter: Payer: Self-pay | Admitting: Internal Medicine

## 2014-07-17 ENCOUNTER — Encounter (HOSPITAL_COMMUNITY): Payer: Self-pay

## 2014-07-17 ENCOUNTER — Emergency Department (HOSPITAL_COMMUNITY)
Admission: EM | Admit: 2014-07-17 | Discharge: 2014-07-17 | Disposition: A | Discharge status: Home / Self Care | Payer: Medicare Other | Source: Home / Self Care | Attending: Emergency Medicine | Admitting: Emergency Medicine

## 2014-07-17 DIAGNOSIS — Z8739 Personal history of other diseases of the musculoskeletal system and connective tissue: Secondary | ICD-10-CM | POA: Insufficient documentation

## 2014-07-17 DIAGNOSIS — E785 Hyperlipidemia, unspecified: Secondary | ICD-10-CM | POA: Diagnosis present

## 2014-07-17 DIAGNOSIS — I2510 Atherosclerotic heart disease of native coronary artery without angina pectoris: Secondary | ICD-10-CM

## 2014-07-17 DIAGNOSIS — Z87891 Personal history of nicotine dependence: Secondary | ICD-10-CM

## 2014-07-17 DIAGNOSIS — Z87440 Personal history of urinary (tract) infections: Secondary | ICD-10-CM | POA: Insufficient documentation

## 2014-07-17 DIAGNOSIS — I701 Atherosclerosis of renal artery: Secondary | ICD-10-CM | POA: Diagnosis not present

## 2014-07-17 DIAGNOSIS — G4733 Obstructive sleep apnea (adult) (pediatric): Secondary | ICD-10-CM | POA: Diagnosis present

## 2014-07-17 DIAGNOSIS — Z8669 Personal history of other diseases of the nervous system and sense organs: Secondary | ICD-10-CM

## 2014-07-17 DIAGNOSIS — I440 Atrioventricular block, first degree: Secondary | ICD-10-CM | POA: Diagnosis not present

## 2014-07-17 DIAGNOSIS — Z955 Presence of coronary angioplasty implant and graft: Secondary | ICD-10-CM | POA: Diagnosis not present

## 2014-07-17 DIAGNOSIS — K5909 Other constipation: Secondary | ICD-10-CM

## 2014-07-17 DIAGNOSIS — I2582 Chronic total occlusion of coronary artery: Secondary | ICD-10-CM | POA: Diagnosis not present

## 2014-07-17 DIAGNOSIS — E669 Obesity, unspecified: Secondary | ICD-10-CM

## 2014-07-17 DIAGNOSIS — I252 Old myocardial infarction: Secondary | ICD-10-CM

## 2014-07-17 DIAGNOSIS — E119 Type 2 diabetes mellitus without complications: Secondary | ICD-10-CM | POA: Diagnosis present

## 2014-07-17 DIAGNOSIS — Z9889 Other specified postprocedural states: Secondary | ICD-10-CM | POA: Insufficient documentation

## 2014-07-17 DIAGNOSIS — E876 Hypokalemia: Secondary | ICD-10-CM | POA: Diagnosis not present

## 2014-07-17 DIAGNOSIS — K644 Residual hemorrhoidal skin tags: Secondary | ICD-10-CM

## 2014-07-17 DIAGNOSIS — E039 Hypothyroidism, unspecified: Secondary | ICD-10-CM | POA: Insufficient documentation

## 2014-07-17 DIAGNOSIS — R61 Generalized hyperhidrosis: Secondary | ICD-10-CM | POA: Diagnosis not present

## 2014-07-17 DIAGNOSIS — I10 Essential (primary) hypertension: Secondary | ICD-10-CM | POA: Insufficient documentation

## 2014-07-17 DIAGNOSIS — Z7902 Long term (current) use of antithrombotics/antiplatelets: Secondary | ICD-10-CM

## 2014-07-17 DIAGNOSIS — Z86010 Personal history of colonic polyps: Secondary | ICD-10-CM

## 2014-07-17 DIAGNOSIS — Z7982 Long term (current) use of aspirin: Secondary | ICD-10-CM

## 2014-07-17 DIAGNOSIS — F329 Major depressive disorder, single episode, unspecified: Secondary | ICD-10-CM | POA: Diagnosis present

## 2014-07-17 DIAGNOSIS — Z86018 Personal history of other benign neoplasm: Secondary | ICD-10-CM | POA: Insufficient documentation

## 2014-07-17 DIAGNOSIS — F4310 Post-traumatic stress disorder, unspecified: Secondary | ICD-10-CM | POA: Diagnosis not present

## 2014-07-17 DIAGNOSIS — Z79899 Other long term (current) drug therapy: Secondary | ICD-10-CM | POA: Insufficient documentation

## 2014-07-17 DIAGNOSIS — N179 Acute kidney failure, unspecified: Secondary | ICD-10-CM | POA: Diagnosis not present

## 2014-07-17 DIAGNOSIS — Z8673 Personal history of transient ischemic attack (TIA), and cerebral infarction without residual deficits: Secondary | ICD-10-CM

## 2014-07-17 DIAGNOSIS — R079 Chest pain, unspecified: Secondary | ICD-10-CM | POA: Diagnosis not present

## 2014-07-17 DIAGNOSIS — I25110 Atherosclerotic heart disease of native coronary artery with unstable angina pectoris: Principal | ICD-10-CM | POA: Diagnosis present

## 2014-07-17 DIAGNOSIS — K219 Gastro-esophageal reflux disease without esophagitis: Secondary | ICD-10-CM | POA: Diagnosis present

## 2014-07-17 DIAGNOSIS — Z820 Family history of epilepsy and other diseases of the nervous system: Secondary | ICD-10-CM

## 2014-07-17 MED ORDER — POLYETHYLENE GLYCOL 3350 17 GM/SCOOP PO POWD: ORAL | Status: DC

## 2014-07-17 MED ORDER — DOCUSATE SODIUM 100 MG PO CAPS: 100.0000 mg | ORAL_CAPSULE | Freq: Two times a day (BID) | ORAL | Status: DC

## 2014-07-17 NOTE — Discharge Instructions (Signed)

## 2014-07-17 NOTE — ED Provider Notes (Signed)
CSN: [id number]     Arrival date & time 07/17/14  1815 History   First MD Initiated Contact with Patient 07/17/14 1942     Chief Complaint  Patient presents with  . Constipation     (Consider location/radiation/quality/duration/timing/severity/associated sxs/prior Treatment) Patient is a 69 y.o. female presenting with constipation. The history is provided by the patient.  Constipation Severity:  Moderate Time since last bowel movement:  1 day Timing:  Constant Progression:  Unchanged Chronicity:  New Context: not stress   Stool description:  None produced Relieved by:  Nothing Worsened by:  Nothing tried Ineffective treatments:  None tried Associated symptoms: no abdominal pain, no fever, no urinary retention and no vomiting   Risk factors: obesity   Risk factors: no change in medication, no recent antibiotic use and no recent surgery     Past Medical History  Diagnosis Date  . Cervical polyp   . Neck mass   . Cervical lymphadenopathy   . Urinary tract infection   . Hyperlipidemia     takes Crestor daily  . Coronary artery disease     Prior stent of the lCX with chronic total occlusion of a small RCA. Last cath in 2004 showing patent stent, nonobstructive disease and total RCA. EF is 65 to 70%. She has been managed medically.   . Tubular adenoma of rectum 10/24/04  . Diabetes mellitus without complication     takes Metformin daily  . Hypothyroidism     takes SYnthroid daily  . Hypertension     takes Amlodipine,Coreg,and Prinzide daily  . TIA (transient ischemic attack)   . Back pain     reason unknown  . History of colon polyps   . Depression     sees a Social worker at [facility] but no meds  . Umbilical hernia   . OSA (obstructive sleep apnea)     PT DENIES, STATES SHE WAS TOLD SHE DIDN'T HAVE OSA, NO CPAP  . PTSD (post-traumatic stress disorder)   . Renal artery stenosis   . Stroke     TIA- LAST ONE GREATER THAN 10 YEARS PER PT   Past Surgical History   Procedure Laterality Date  . Cardiac catheterization  2005/2006/2007  . Colonoscopy  10/2004  . Appendectomy    . Ankle surgery Left     plates and screws  . Coronary angioplasty      x 1  . Umbilical hernia repair    . Umbilical hernia repair N/A 05/27/2013    Procedure: HERNIA REPAIR UMBILICAL ADULT;  Surgeon: Imogene Burn. Georgette Dover, MD;  Location: [place];  Service: General;  Laterality: N/A;  . Insertion of mesh N/A 05/27/2013    Procedure: INSERTION OF MESH;  Surgeon: Imogene Burn. Georgette Dover, MD;  Location: [place] [facility];  Service: General;  Laterality: N/A;   Family History  Problem Relation Age of Onset  . Hypertension Mother   . Osteoarthritis Mother   . Alzheimer's disease Father   . Cancer Neg Hx   . Stroke Neg Hx   . Colon cancer Neg Hx   . Esophageal cancer Neg Hx   . Stomach cancer Neg Hx   . Rectal cancer Neg Hx    History  Substance Use Topics  . Smoking status: Former Research scientist (life sciences)  . Smokeless tobacco: Never Used     Comment: quit smoking about 30 yrs ago  . Alcohol Use: 0.0 oz/week    0 Standard drinks or equivalent per week     Comment: Wine on  special occasions   OB History    No data available     Review of Systems  Constitutional: Negative for fever.  Gastrointestinal: Positive for constipation. Negative for vomiting and abdominal pain.  All other systems reviewed and are negative.     Allergies  Review of patient's allergies indicates no known allergies.  Home Medications   Prior to Admission medications   Medication Sig Start Date End Date Taking? Authorizing Provider  amLODipine (NORVASC) 10 MG tablet Take 1 tablet (10 mg total) by mouth daily. 07/06/14  Yes Lucious Groves, DO  aspirin 325 MG tablet Take 325 mg by mouth daily.     Yes Historical Provider, MD  carvedilol (COREG) 25 MG tablet TAKE ONE-HALF TABLET BY MOUTH TWICE DAILY 05/23/14  Yes Sherren Mocha, MD  levothyroxine (SYNTHROID, LEVOTHROID) 125 MCG tablet TAKE ONE TABLET BY MOUTH ONCE DAILY BEFORE BREAKFAST  12/13/13  Yes Lucious Groves, DO  lisinopril-hydrochlorothiazide (PRINZIDE,ZESTORETIC) 20-12.5 MG per tablet Take 1 tablet by mouth 2 (two) times daily. 07/07/14  Yes Lucious Groves, DO  metFORMIN (GLUCOPHAGE XR) 500 MG 24 hr tablet Take 1 tablet (500 mg total) by mouth daily with breakfast. 07/07/14 07/07/15 Yes Lucious Groves, DO  rosuvastatin (CRESTOR) 10 MG tablet Take 1 tablet (10 mg total) by mouth at bedtime. 07/07/14 07/07/15 Yes Lucious Groves, DO  docusate sodium (COLACE) 100 MG capsule Take 1 capsule (100 mg total) by mouth every 12 (twelve) hours. 07/17/14   Leo Grosser, MD  nitroGLYCERIN (NITROSTAT) 0.4 MG SL tablet Place 0.4 mg under the tongue every 5 (five) minutes as needed for chest pain. Up to 3 doses    Historical Provider, MD  polyethylene glycol powder (MIRALAX) powder TAKE 6 CAPFULS OF MIRALAX IN A 32 OUNCE GATORADE AND DRINK THE WHOLE BEVERAGE FOLLOWED BY 3 CAPFULS TWICE A DAY FOR THE NEXT WEEK AND FOLLOW UP WITH YOUR PRIMARY CARE PHYSICIAN. 07/17/14   Leo Grosser, MD   BP 144/71 mmHg  Pulse 83  Temp(Src) 98.9 F (37.2 C) (Oral)  Resp 16  Ht 5\' 5"  (1.651 m)  Wt 212 lb 14.4 oz (96.571 kg)  BMI 35.43 kg/m2  SpO2 97% Physical Exam  Constitutional: She is oriented to person, place, and time. She appears well-developed and well-nourished. No distress.  HENT:  Head: Normocephalic.  Eyes: Conjunctivae are normal.  Neck: Neck supple. No tracheal deviation present.  Cardiovascular: Normal rate and regular rhythm.   Pulmonary/Chest: Effort normal. No respiratory distress.  Abdominal: Soft. She exhibits no distension.  Genitourinary: Rectal exam shows external hemorrhoid.  Large firm stool in rectal vault  Neurological: She is alert and oriented to person, place, and time.  Skin: Skin is warm and dry.  Psychiatric: She has a normal mood and affect.    ED Course  Procedures (including critical care time) Labs Review Labs Reviewed - No data to display  Imaging Review No results  found.   EKG Interpretation None      MDM   Final diagnoses:  Other constipation    69 year old female presents with constipation over the course of the last day. Has had intermittent firm bowel movements but does not feel that this one is able to pass on its own. She states she tried to take an enema that was ineffective. On digital exam is able to manually disimpact and the patient had a bowel movement spontaneously following.  Recommended MiraLAX cleanout and stool softeners with primary care physician follow-up as needed for  uncomplicated constipation.     Leo Grosser, MD 78/93/81 [id number]  Delora Fuel, MD 11/29/83 [id number]

## 2014-07-17 NOTE — ED Notes (Signed)
Pt reports having rectal pain, unable to have BM.  Had small BM yesterday that was hard.  Last normal stool was 07-12-14.  Usually has one BM every week.  Small amount of bright red bleeding when wiping.

## 2014-07-17 NOTE — ED Notes (Signed)
Asst pt to restroom.  Ambulated with no assistance.

## 2014-07-17 NOTE — ED Notes (Signed)
Pt stable, ambulatory, states understanding of discharge instructions 

## 2014-07-18 ENCOUNTER — Emergency Department (HOSPITAL_COMMUNITY): Payer: Medicare Other

## 2014-07-18 ENCOUNTER — Inpatient Hospital Stay (HOSPITAL_COMMUNITY)
Admission: EM | Admit: 2014-07-18 | Discharge: 2014-07-19 | DRG: 247 | Disposition: A | Discharge status: Home / Self Care | Payer: Medicare Other | Attending: Cardiovascular Disease | Admitting: Cardiovascular Disease

## 2014-07-18 ENCOUNTER — Encounter (HOSPITAL_COMMUNITY)
Admission: EM | Disposition: A | Discharge status: Home / Self Care | Payer: Medicare Other | Source: Home / Self Care | Attending: Cardiovascular Disease

## 2014-07-18 ENCOUNTER — Encounter (HOSPITAL_COMMUNITY): Payer: Self-pay

## 2014-07-18 DIAGNOSIS — E785 Hyperlipidemia, unspecified: Secondary | ICD-10-CM | POA: Diagnosis present

## 2014-07-18 DIAGNOSIS — E039 Hypothyroidism, unspecified: Secondary | ICD-10-CM | POA: Diagnosis present

## 2014-07-18 DIAGNOSIS — E1151 Type 2 diabetes mellitus with diabetic peripheral angiopathy without gangrene: Secondary | ICD-10-CM

## 2014-07-18 DIAGNOSIS — Z79899 Other long term (current) drug therapy: Secondary | ICD-10-CM | POA: Diagnosis not present

## 2014-07-18 DIAGNOSIS — Z7982 Long term (current) use of aspirin: Secondary | ICD-10-CM | POA: Diagnosis not present

## 2014-07-18 DIAGNOSIS — I25110 Atherosclerotic heart disease of native coronary artery with unstable angina pectoris: Secondary | ICD-10-CM | POA: Diagnosis present

## 2014-07-18 DIAGNOSIS — E119 Type 2 diabetes mellitus without complications: Secondary | ICD-10-CM

## 2014-07-18 DIAGNOSIS — Z955 Presence of coronary angioplasty implant and graft: Secondary | ICD-10-CM

## 2014-07-18 DIAGNOSIS — F329 Major depressive disorder, single episode, unspecified: Secondary | ICD-10-CM | POA: Diagnosis present

## 2014-07-18 DIAGNOSIS — R079 Chest pain, unspecified: Secondary | ICD-10-CM | POA: Diagnosis present

## 2014-07-18 DIAGNOSIS — R61 Generalized hyperhidrosis: Secondary | ICD-10-CM | POA: Diagnosis present

## 2014-07-18 DIAGNOSIS — N179 Acute kidney failure, unspecified: Secondary | ICD-10-CM

## 2014-07-18 DIAGNOSIS — Z87891 Personal history of nicotine dependence: Secondary | ICD-10-CM | POA: Diagnosis not present

## 2014-07-18 DIAGNOSIS — I200 Unstable angina: Secondary | ICD-10-CM | POA: Diagnosis not present

## 2014-07-18 DIAGNOSIS — I2582 Chronic total occlusion of coronary artery: Secondary | ICD-10-CM | POA: Diagnosis present

## 2014-07-18 DIAGNOSIS — I10 Essential (primary) hypertension: Secondary | ICD-10-CM | POA: Diagnosis present

## 2014-07-18 DIAGNOSIS — E876 Hypokalemia: Secondary | ICD-10-CM | POA: Diagnosis present

## 2014-07-18 DIAGNOSIS — I252 Old myocardial infarction: Secondary | ICD-10-CM | POA: Diagnosis not present

## 2014-07-18 DIAGNOSIS — G4733 Obstructive sleep apnea (adult) (pediatric): Secondary | ICD-10-CM | POA: Diagnosis present

## 2014-07-18 DIAGNOSIS — I701 Atherosclerosis of renal artery: Secondary | ICD-10-CM | POA: Diagnosis present

## 2014-07-18 DIAGNOSIS — I440 Atrioventricular block, first degree: Secondary | ICD-10-CM | POA: Diagnosis present

## 2014-07-18 DIAGNOSIS — I2510 Atherosclerotic heart disease of native coronary artery without angina pectoris: Secondary | ICD-10-CM | POA: Diagnosis present

## 2014-07-18 DIAGNOSIS — Z820 Family history of epilepsy and other diseases of the nervous system: Secondary | ICD-10-CM | POA: Diagnosis not present

## 2014-07-18 DIAGNOSIS — I25119 Atherosclerotic heart disease of native coronary artery with unspecified angina pectoris: Secondary | ICD-10-CM | POA: Diagnosis present

## 2014-07-18 DIAGNOSIS — Z7902 Long term (current) use of antithrombotics/antiplatelets: Secondary | ICD-10-CM | POA: Diagnosis not present

## 2014-07-18 DIAGNOSIS — Z8673 Personal history of transient ischemic attack (TIA), and cerebral infarction without residual deficits: Secondary | ICD-10-CM | POA: Diagnosis not present

## 2014-07-18 DIAGNOSIS — F4310 Post-traumatic stress disorder, unspecified: Secondary | ICD-10-CM | POA: Diagnosis present

## 2014-07-18 DIAGNOSIS — K219 Gastro-esophageal reflux disease without esophagitis: Secondary | ICD-10-CM | POA: Diagnosis present

## 2014-07-18 HISTORY — DX: Atherosclerotic heart disease of native coronary artery without angina pectoris: I25.10

## 2014-07-18 HISTORY — PX: CARDIAC CATHETERIZATION: SHX172

## 2014-07-18 HISTORY — PX: CORONARY STENT PLACEMENT: SHX1402

## 2014-07-18 LAB — BASIC METABOLIC PANEL
Anion gap: 10 (ref 5–15)
BUN: 9 mg/dL (ref 6–20)
CO2: 26 mmol/L (ref 22–32)
Calcium: 9 mg/dL (ref 8.9–10.3)
Chloride: 103 mmol/L (ref 101–111)
Creatinine, Ser: 1.15 mg/dL — ABNORMAL HIGH (ref 0.44–1.00)
GFR calc Af Amer: 56 mL/min — ABNORMAL LOW (ref >60)
GFR calc non Af Amer: 48 mL/min — ABNORMAL LOW (ref >60)
Glucose, Bld: 213 mg/dL — ABNORMAL HIGH (ref 65–99)
Potassium: 3.1 mmol/L — ABNORMAL LOW (ref 3.5–5.1)
Sodium: 139 mmol/L (ref 135–145)

## 2014-07-18 LAB — CBC WITH DIFFERENTIAL/PLATELET
Basophils Absolute: 0 10*3/uL (ref 0.0–0.1)
Basophils Relative: 0 % (ref 0–1)
Eosinophils Absolute: 0 10*3/uL (ref 0.0–0.7)
Eosinophils Relative: 0 % (ref 0–5)
HCT: 36.2 % (ref 36.0–46.0)
Hemoglobin: 11.9 g/dL — ABNORMAL LOW (ref 12.0–15.0)
Lymphocytes Relative: 17 % (ref 12–46)
Lymphs Abs: 1.8 10*3/uL (ref 0.7–4.0)
MCH: 27.7 pg (ref 26.0–34.0)
MCHC: 32.9 g/dL (ref 30.0–36.0)
MCV: 84.2 fL (ref 78.0–100.0)
Monocytes Absolute: 0.5 10*3/uL (ref 0.1–1.0)
Monocytes Relative: 5 % (ref 3–12)
Neutro Abs: 8.3 10*3/uL — ABNORMAL HIGH (ref 1.7–7.7)
Neutrophils Relative %: 78 % — ABNORMAL HIGH (ref 43–77)
Platelets: 304 10*3/uL (ref 150–400)
RBC: 4.3 MIL/uL (ref 3.87–5.11)
RDW: 13.8 % (ref 11.5–15.5)
WBC: 10.7 10*3/uL — ABNORMAL HIGH (ref 4.0–10.5)

## 2014-07-18 LAB — POCT ACTIVATED CLOTTING TIME: Activated Clotting Time: 454 seconds

## 2014-07-18 LAB — GLUCOSE, CAPILLARY
Glucose-Capillary: 116 mg/dL — ABNORMAL HIGH (ref 65–99)
Glucose-Capillary: 128 mg/dL — ABNORMAL HIGH (ref 65–99)
Glucose-Capillary: 157 mg/dL — ABNORMAL HIGH (ref 65–99)
Glucose-Capillary: 232 mg/dL — ABNORMAL HIGH (ref 65–99)

## 2014-07-18 LAB — COMPREHENSIVE METABOLIC PANEL
ALT: 15 U/L (ref 14–54)
AST: 17 U/L (ref 15–41)
Albumin: 3.4 g/dL — ABNORMAL LOW (ref 3.5–5.0)
Alkaline Phosphatase: 64 U/L (ref 38–126)
Anion gap: 8 (ref 5–15)
BUN: 10 mg/dL (ref 6–20)
CO2: 25 mmol/L (ref 22–32)
Calcium: 8.5 mg/dL — ABNORMAL LOW (ref 8.9–10.3)
Chloride: 106 mmol/L (ref 101–111)
Creatinine, Ser: 0.94 mg/dL (ref 0.44–1.00)
GFR calc Af Amer: >60 mL/min (ref >60)
GFR calc non Af Amer: >60 mL/min (ref >60)
Glucose, Bld: 254 mg/dL — ABNORMAL HIGH (ref 65–99)
Potassium: 3.7 mmol/L (ref 3.5–5.1)
Sodium: 139 mmol/L (ref 135–145)
Total Bilirubin: 0.6 mg/dL (ref 0.3–1.2)
Total Protein: 6.9 g/dL (ref 6.5–8.1)

## 2014-07-18 LAB — RAPID URINE DRUG SCREEN, HOSP PERFORMED
Amphetamines: NOT DETECTED
Barbiturates: NOT DETECTED
Benzodiazepines: NOT DETECTED
Cocaine: NOT DETECTED
Opiates: NOT DETECTED
Tetrahydrocannabinol: NOT DETECTED

## 2014-07-18 LAB — TROPONIN I
Troponin I: 0.03 ng/mL (ref <0.031)
Troponin I: <0.03 ng/mL (ref <0.031)

## 2014-07-18 LAB — I-STAT TROPONIN, ED: Troponin i, poc: 0 ng/mL (ref 0.00–0.08)

## 2014-07-18 LAB — MAGNESIUM: Magnesium: 2 mg/dL (ref 1.7–2.4)

## 2014-07-18 LAB — PROTIME-INR
INR: 1.81 — ABNORMAL HIGH (ref 0.00–1.49)
Prothrombin Time: 21 seconds — ABNORMAL HIGH (ref 11.6–15.2)

## 2014-07-18 SURGERY — LEFT HEART CATH AND CORONARY ANGIOGRAPHY

## 2014-07-18 MED ORDER — SODIUM CHLORIDE 0.9 % IJ SOLN: 3.0000 mL | INTRAMUSCULAR | Status: DC | PRN

## 2014-07-18 MED ORDER — INSULIN ASPART 100 UNIT/ML ~~LOC~~ SOLN
0.0000 [IU] | Freq: Three times a day (TID) | SUBCUTANEOUS | Status: DC
  Administered 2014-07-19: 1 [IU] via SUBCUTANEOUS

## 2014-07-18 MED ORDER — FAMOTIDINE IN NACL 20-0.9 MG/50ML-% IV SOLN
INTRAVENOUS | Status: AC
  Filled 2014-07-18: qty 50

## 2014-07-18 MED ORDER — MORPHINE SULFATE 2 MG/ML IJ SOLN: 2.0000 mg | INTRAMUSCULAR | Status: DC | PRN

## 2014-07-18 MED ORDER — BIVALIRUDIN 250 MG IV SOLR
INTRAVENOUS | Status: AC
  Filled 2014-07-18: qty 250

## 2014-07-18 MED ORDER — IOHEXOL 350 MG/ML SOLN
INTRAVENOUS | Status: DC | PRN
  Administered 2014-07-18: 280 mL via INTRA_ARTERIAL

## 2014-07-18 MED ORDER — CLOPIDOGREL BISULFATE 75 MG PO TABS
75.0000 mg | ORAL_TABLET | Freq: Every day | ORAL | Status: DC
  Administered 2014-07-19: 09:00:00 75 mg via ORAL
  Filled 2014-07-18: qty 1

## 2014-07-18 MED ORDER — MIDAZOLAM HCL 2 MG/2ML IJ SOLN
INTRAMUSCULAR | Status: DC | PRN
  Administered 2014-07-18: 1 mg via INTRAVENOUS

## 2014-07-18 MED ORDER — POTASSIUM CHLORIDE IN NACL 20-0.9 MEQ/L-% IV SOLN
INTRAVENOUS | Status: AC
  Administered 2014-07-18: 10:00:00 via INTRAVENOUS
  Filled 2014-07-18 (×2): qty 1000

## 2014-07-18 MED ORDER — ACETAMINOPHEN 325 MG PO TABS: 650.0000 mg | ORAL_TABLET | ORAL | Status: DC | PRN

## 2014-07-18 MED ORDER — ASPIRIN 81 MG PO CHEW: 81.0000 mg | CHEWABLE_TABLET | ORAL | Status: DC

## 2014-07-18 MED ORDER — ASPIRIN 81 MG PO CHEW: 81.0000 mg | CHEWABLE_TABLET | Freq: Every day | ORAL | Status: DC

## 2014-07-18 MED ORDER — POTASSIUM CHLORIDE CRYS ER 20 MEQ PO TBCR
40.0000 meq | EXTENDED_RELEASE_TABLET | Freq: Once | ORAL | Status: AC
Start: 2014-07-18 — End: 2014-07-18
  Administered 2014-07-18: 40 meq via ORAL
  Filled 2014-07-18: qty 2

## 2014-07-18 MED ORDER — SODIUM CHLORIDE 0.9 % IV SOLN: 250.0000 mL | INTRAVENOUS | Status: DC | PRN

## 2014-07-18 MED ORDER — INSULIN ASPART 100 UNIT/ML ~~LOC~~ SOLN
0.0000 [IU] | Freq: Three times a day (TID) | SUBCUTANEOUS | Status: DC
  Administered 2014-07-18 – 2014-07-19 (×2): 2 [IU] via SUBCUTANEOUS

## 2014-07-18 MED ORDER — SODIUM CHLORIDE 0.9 % WEIGHT BASED INFUSION: 1.0000 mL/kg/h | INTRAVENOUS | Status: AC

## 2014-07-18 MED ORDER — ATORVASTATIN CALCIUM 80 MG PO TABS
80.0000 mg | ORAL_TABLET | Freq: Every day | ORAL | Status: DC
  Administered 2014-07-18: 18:00:00 80 mg via ORAL
  Filled 2014-07-18: qty 1

## 2014-07-18 MED ORDER — HEPARIN SODIUM (PORCINE) 1000 UNIT/ML IJ SOLN
INTRAMUSCULAR | Status: AC
  Filled 2014-07-18: qty 1

## 2014-07-18 MED ORDER — LEVOTHYROXINE SODIUM 125 MCG PO TABS
125.0000 ug | ORAL_TABLET | Freq: Every day | ORAL | Status: DC
  Administered 2014-07-18 – 2014-07-19 (×2): 125 ug via ORAL
  Filled 2014-07-18 (×3): qty 1

## 2014-07-18 MED ORDER — AMLODIPINE BESYLATE 10 MG PO TABS
10.0000 mg | ORAL_TABLET | Freq: Every day | ORAL | Status: DC
  Administered 2014-07-18 – 2014-07-19 (×2): 10 mg via ORAL
  Filled 2014-07-18 (×2): qty 1

## 2014-07-18 MED ORDER — POLYETHYLENE GLYCOL 3350 17 G PO PACK
17.0000 g | PACK | Freq: Every day | ORAL | Status: DC
  Administered 2014-07-19: 17 g via ORAL
  Filled 2014-07-18 (×2): qty 1

## 2014-07-18 MED ORDER — SODIUM CHLORIDE 0.9 % IV SOLN
INTRAVENOUS | Status: DC
  Administered 2014-07-18: 13:00:00 via INTRAVENOUS

## 2014-07-18 MED ORDER — ONDANSETRON HCL 4 MG/2ML IJ SOLN: 4.0000 mg | Freq: Four times a day (QID) | INTRAMUSCULAR | Status: DC | PRN

## 2014-07-18 MED ORDER — ROSUVASTATIN CALCIUM 10 MG PO TABS
10.0000 mg | ORAL_TABLET | Freq: Every day | ORAL | Status: DC
  Filled 2014-07-18: qty 1

## 2014-07-18 MED ORDER — CLOPIDOGREL BISULFATE 300 MG PO TABS
ORAL_TABLET | ORAL | Status: DC | PRN
  Administered 2014-07-18: 600 mg via ORAL

## 2014-07-18 MED ORDER — LIDOCAINE HCL (PF) 1 % IJ SOLN
INTRAMUSCULAR | Status: AC
  Filled 2014-07-18: qty 30

## 2014-07-18 MED ORDER — VERAPAMIL HCL 2.5 MG/ML IV SOLN
INTRAVENOUS | Status: DC | PRN
  Administered 2014-07-18: 15:00:00 via INTRA_ARTERIAL

## 2014-07-18 MED ORDER — HEPARIN (PORCINE) IN NACL 2-0.9 UNIT/ML-% IJ SOLN
INTRAMUSCULAR | Status: AC
  Filled 2014-07-18: qty 1000

## 2014-07-18 MED ORDER — SODIUM CHLORIDE 0.9 % IV SOLN
1.0000 mg/kg/h | INTRAVENOUS | Status: AC
  Administered 2014-07-18: 19:00:00 1 mg/kg/h via INTRAVENOUS
  Filled 2014-07-18 (×2): qty 250

## 2014-07-18 MED ORDER — CLOPIDOGREL BISULFATE 300 MG PO TABS
ORAL_TABLET | ORAL | Status: AC
  Filled 2014-07-18: qty 1

## 2014-07-18 MED ORDER — BIVALIRUDIN BOLUS VIA INFUSION - CUPID
INTRAVENOUS | Status: DC | PRN
  Administered 2014-07-18: 72.45 mg via INTRAVENOUS

## 2014-07-18 MED ORDER — ENOXAPARIN SODIUM 40 MG/0.4ML ~~LOC~~ SOLN
40.0000 mg | SUBCUTANEOUS | Status: DC
  Administered 2014-07-19: 09:00:00 40 mg via SUBCUTANEOUS
  Filled 2014-07-18 (×2): qty 0.4

## 2014-07-18 MED ORDER — HEPARIN SODIUM (PORCINE) 1000 UNIT/ML IJ SOLN
INTRAMUSCULAR | Status: DC | PRN
  Administered 2014-07-18: 5000 [IU] via INTRAVENOUS

## 2014-07-18 MED ORDER — ANGIOPLASTY BOOK
Freq: Once | Status: AC
  Administered 2014-07-18: 23:00:00
  Filled 2014-07-18: qty 1

## 2014-07-18 MED ORDER — FENTANYL CITRATE (PF) 100 MCG/2ML IJ SOLN
INTRAMUSCULAR | Status: DC | PRN
  Administered 2014-07-18 (×2): 25 ug via INTRAVENOUS

## 2014-07-18 MED ORDER — CARVEDILOL 12.5 MG PO TABS
12.5000 mg | ORAL_TABLET | Freq: Two times a day (BID) | ORAL | Status: DC
  Administered 2014-07-18 – 2014-07-19 (×3): 12.5 mg via ORAL
  Filled 2014-07-18 (×4): qty 1

## 2014-07-18 MED ORDER — FENTANYL CITRATE (PF) 100 MCG/2ML IJ SOLN
INTRAMUSCULAR | Status: AC
  Filled 2014-07-18: qty 2

## 2014-07-18 MED ORDER — LIDOCAINE HCL (PF) 1 % IJ SOLN
INTRAMUSCULAR | Status: DC | PRN
  Administered 2014-07-18: 2 mL via SUBCUTANEOUS

## 2014-07-18 MED ORDER — SODIUM CHLORIDE 0.9 % IJ SOLN
3.0000 mL | Freq: Two times a day (BID) | INTRAMUSCULAR | Status: DC
  Administered 2014-07-19: 09:00:00 3 mL via INTRAVENOUS

## 2014-07-18 MED ORDER — NITROGLYCERIN 1 MG/10 ML FOR IR/CATH LAB
INTRA_ARTERIAL | Status: AC
  Filled 2014-07-18: qty 10

## 2014-07-18 MED ORDER — BIVALIRUDIN 250 MG IV SOLR
250.0000 mg | INTRAVENOUS | Status: DC | PRN
  Administered 2014-07-18: 1.75 mg/kg/h via INTRAVENOUS

## 2014-07-18 MED ORDER — FAMOTIDINE IN NACL 20-0.9 MG/50ML-% IV SOLN
INTRAVENOUS | Status: DC | PRN
  Administered 2014-07-18: 20 mg via INTRAVENOUS

## 2014-07-18 MED ORDER — ASPIRIN 325 MG PO TABS: 325.0000 mg | ORAL_TABLET | Freq: Once | ORAL | Status: DC

## 2014-07-18 MED ORDER — NITROGLYCERIN 1 MG/10 ML FOR IR/CATH LAB
INTRA_ARTERIAL | Status: DC | PRN
  Administered 2014-07-18: 200 ug via INTRACORONARY

## 2014-07-18 MED ORDER — NITROGLYCERIN 0.4 MG SL SUBL: 0.4000 mg | SUBLINGUAL_TABLET | SUBLINGUAL | Status: DC | PRN

## 2014-07-18 MED ORDER — SODIUM CHLORIDE 0.9 % IJ SOLN: 3.0000 mL | Freq: Two times a day (BID) | INTRAMUSCULAR | Status: DC

## 2014-07-18 MED ORDER — DOCUSATE SODIUM 100 MG PO CAPS
100.0000 mg | ORAL_CAPSULE | Freq: Two times a day (BID) | ORAL | Status: DC
  Administered 2014-07-18 – 2014-07-19 (×3): 100 mg via ORAL
  Filled 2014-07-18 (×3): qty 1

## 2014-07-18 MED ORDER — INSULIN ASPART 100 UNIT/ML ~~LOC~~ SOLN
0.0000 [IU] | Freq: Every day | SUBCUTANEOUS | Status: DC
  Administered 2014-07-18: 23:00:00 2 [IU] via SUBCUTANEOUS

## 2014-07-18 MED ORDER — MIDAZOLAM HCL 2 MG/2ML IJ SOLN
INTRAMUSCULAR | Status: AC
Start: 2014-07-18 — End: 2014-07-18
  Filled 2014-07-18: qty 2

## 2014-07-18 MED ORDER — VERAPAMIL HCL 2.5 MG/ML IV SOLN
INTRAVENOUS | Status: AC
  Filled 2014-07-18: qty 2

## 2014-07-18 MED ORDER — ASPIRIN 81 MG PO CHEW
81.0000 mg | CHEWABLE_TABLET | Freq: Every day | ORAL | Status: DC
  Administered 2014-07-18 – 2014-07-19 (×2): 81 mg via ORAL
  Filled 2014-07-18 (×2): qty 1

## 2014-07-18 SURGICAL SUPPLY — 22 items
BALLN EMERGE MR 2.0X12 (BALLOONS) ×2
BALLOON EMERGE MR 2.0X12 (BALLOONS) ×1 IMPLANT
CATH EXPO 5FR FR4 (CATHETERS) ×2 IMPLANT
CATH INFINITI 5FR ANG PIGTAIL (CATHETERS) ×2 IMPLANT
CATH INFINITI 5FR MULTPACK ANG (CATHETERS) IMPLANT
CATH OPTITORQUE TIG 4.0 5F (CATHETERS) ×2 IMPLANT
CATH VISTA GUIDE 6FR XB4 (CATHETERS) ×2 IMPLANT
DEVICE RAD COMP TR BAND LRG (VASCULAR PRODUCTS) ×2 IMPLANT
GLIDESHEATH SLEND A-KIT 6F 22G (SHEATH) ×2 IMPLANT
KIT ENCORE 26 ADVANTAGE (KITS) ×2 IMPLANT
KIT HEART LEFT (KITS) ×2 IMPLANT
PACK CARDIAC CATHETERIZATION (CUSTOM PROCEDURE TRAY) ×2 IMPLANT
SHEATH PINNACLE 5F 10CM (SHEATH) IMPLANT
STENT SYNERGY DES 2.5X12 (Permanent Stent) ×2 IMPLANT
SYR MEDRAD MARK V 150ML (SYRINGE) ×2 IMPLANT
TRANSDUCER W/STOPCOCK (MISCELLANEOUS) ×2 IMPLANT
TUBING CIL FLEX 10 FLL-RA (TUBING) ×2 IMPLANT
WIRE ASAHI PROWATER 180CM (WIRE) ×2 IMPLANT
WIRE EMERALD 3MM-J .035X150CM (WIRE) IMPLANT
WIRE HI TORQ VERSACORE-J 145CM (WIRE) ×2 IMPLANT
WIRE HI TORQ WHISPER MS 190CM (WIRE) IMPLANT
WIRE SAFE-T 1.5MM-J .035X260CM (WIRE) ×2 IMPLANT

## 2014-07-18 NOTE — H&P (View-Only) (Signed)
Admit date: 07/18/2014 Referring Physician  Dr. Eppie Gibson Primary Physician  Dr. Heber Ludden Primary Cardiologist  Dr. Burt Knack Reason for Consultation  Chest pain  HPI: This is a 69yo female with a history of ASCAD s/p PCI of LCx and chronically occluded small RCA and last cath 2004 with patent LCx stent, renal artery stenosis s/p right renal artery stenting in 2007 with last renal arterial duplex in 2011 with patent stent, HTN, type 2 DM, dyslipidemia and GERD who presented to the ER with complaints of chest pain which started last night around 10pm.  She had been seen in the ER earlier that day for some constipation and was sent home.  She was doing laundry at the time and started to feel nauseated.  She sat down and then laid down. and developed SOB and severe heavy pressure in her chest with radiation to her back and right neck and had to sit up.  She broke out in a sweat.  She also felt like her heart was fluttering.   She took 2 baby ASA and 1 SL NTG but pain persisted so she took another SL NTG and CP subsided after about 10 minutes. She was transferred to the ER via EMS.  On arrival in ER she was pain free.  Of note, she had similar symptoms of SOB and diaphoresis in 1998 at which time she got PCI to the left circ.  She has not had any further CP and initial troponin is normal.  Cardiology is now asked to evaluate.     PMH:   Past Medical History  Diagnosis Date  . Cervical polyp   . Neck mass   . Cervical lymphadenopathy   . Urinary tract infection   . Hyperlipidemia     takes Crestor daily  . Coronary artery disease     Prior stent of the lCX with chronic total occlusion of a small RCA. Last cath in 2004 showing patent stent, nonobstructive disease and total RCA. EF is 65 to 70%. She has been managed medically.   . Tubular adenoma of rectum 10/24/04  . Diabetes mellitus without complication     takes Metformin daily  . Hypothyroidism     takes SYnthroid daily  . Hypertension     takes  Amlodipine,Coreg,and Prinzide daily  . TIA (transient ischemic attack)   . Back pain     reason unknown  . History of colon polyps   . Depression     sees a Social worker at [facility] but no meds  . Umbilical hernia   . OSA (obstructive sleep apnea)     PT DENIES, STATES SHE WAS TOLD SHE DIDN'T HAVE OSA, NO CPAP  . PTSD (post-traumatic stress disorder)   . Renal artery stenosis   . Stroke     TIA- LAST ONE GREATER THAN 10 YEARS PER PT     PSH:   Past Surgical History  Procedure Laterality Date  . Cardiac catheterization  2005/2006/2007  . Colonoscopy  10/2004  . Appendectomy    . Ankle surgery Left     plates and screws  . Coronary angioplasty      x 1  . Umbilical hernia repair    . Umbilical hernia repair N/A 05/27/2013    Procedure: HERNIA REPAIR UMBILICAL ADULT;  Surgeon: Imogene Burn. Georgette Dover, MD;  Location: [facility];  Service: General;  Laterality: N/A;  . Insertion of mesh N/A 05/27/2013    Procedure: INSERTION OF MESH;  Surgeon: Imogene Burn. Tsuei,  MD;  Location: [facility];  Service: General;  Laterality: N/A;    Allergies:  Review of patient's allergies indicates no known allergies. Prior to Admit Meds:   Prescriptions prior to admission  Medication Sig Dispense Refill Last Dose  . amLODipine (NORVASC) 10 MG tablet Take 1 tablet (10 mg total) by mouth daily. 90 tablet 0 07/17/2014 at Unknown time  . aspirin 325 MG tablet Take 325 mg by mouth daily.     07/17/2014 at Unknown time  . carvedilol (COREG) 25 MG tablet TAKE ONE-HALF TABLET BY MOUTH TWICE DAILY 30 tablet 10 07/17/2014 at 0900  . docusate sodium (COLACE) 100 MG capsule Take 1 capsule (100 mg total) by mouth every 12 (twelve) hours. 30 capsule 0   . levothyroxine (SYNTHROID, LEVOTHROID) 125 MCG tablet TAKE ONE TABLET BY MOUTH ONCE DAILY BEFORE BREAKFAST 30 tablet 5 07/17/2014 at Unknown time  . lisinopril-hydrochlorothiazide (PRINZIDE,ZESTORETIC) 20-12.5 MG per tablet Take 1 tablet by mouth 2 (two) times daily. 60 tablet 11  07/17/2014 at Unknown time  . metFORMIN (GLUCOPHAGE XR) 500 MG 24 hr tablet Take 1 tablet (500 mg total) by mouth daily with breakfast. 30 tablet 11 07/17/2014 at Unknown time  . nitroGLYCERIN (NITROSTAT) 0.4 MG SL tablet Place 0.4 mg under the tongue every 5 (five) minutes as needed for chest pain. Up to 3 doses   not started  . polyethylene glycol powder (MIRALAX) powder TAKE 6 CAPFULS OF MIRALAX IN A 32 OUNCE GATORADE AND DRINK THE WHOLE BEVERAGE FOLLOWED BY 3 CAPFULS TWICE A DAY FOR THE NEXT WEEK AND FOLLOW UP WITH YOUR PRIMARY CARE PHYSICIAN. 255 g 0   . rosuvastatin (CRESTOR) 10 MG tablet Take 1 tablet (10 mg total) by mouth at bedtime. 30 tablet 11 07/16/2014 at Unknown time   Fam HX:    Family History  Problem Relation Age of Onset  . Hypertension Mother   . Osteoarthritis Mother   . Alzheimer's disease Father   . Cancer Neg Hx   . Stroke Neg Hx   . Colon cancer Neg Hx   . Esophageal cancer Neg Hx   . Stomach cancer Neg Hx   . Rectal cancer Neg Hx    Social HX:    History   Social History  . Marital Status: Single    Spouse Name: N/A  . Number of Children: N/A  . Years of Education: N/A   Occupational History  . Not on file.   Social History Main Topics  . Smoking status: Former Research scientist (life sciences)  . Smokeless tobacco: Never Used     Comment: quit smoking about 30 yrs ago  . Alcohol Use: 0.0 oz/week    0 Standard drinks or equivalent per week     Comment: Wine on special occasions  . Drug Use: No  . Sexual Activity: Not on file   Other Topics Concern  . Not on file   Social History Narrative     ROS:  All 11 ROS were addressed and are negative except what is stated in the HPI  Physical Exam: Blood pressure 134/69, pulse 76, temperature 98.9 F (37.2 C), temperature source Oral, resp. rate 22, height 5\' 5"  (1.651 m), weight 212 lb 14.4 oz (96.571 kg), SpO2 97 %.    General: Well developed, well nourished, in no acute distress Head: Eyes PERRLA, No xanthomas.   Normal  cephalic and atramatic  Lungs:   Clear bilaterally to auscultation and percussion. Heart:   HRRR S1 S2 Pulses are 2+ &  equal.            No carotid bruit. No JVD.  No abdominal bruits. No femoral bruits. Abdomen: Bowel sounds are positive, abdomen soft and non-tender without masses  Extremities:   No clubbing, cyanosis or edema.  DP +1 Neuro: Alert and oriented X 3. Psych:  Good affect, responds appropriately    Labs:   Lab Results  Component Value Date   WBC 10.7* 07/18/2014   HGB 11.9* 07/18/2014   HCT 36.2 07/18/2014   MCV 84.2 07/18/2014   PLT 304 07/18/2014    Recent Labs Lab 07/18/14 0209  NA 139  K 3.1*  CL 103  CO2 26  BUN 9  CREATININE 1.15*  CALCIUM 9.0  GLUCOSE 213*   No results found for: PTT Lab Results  Component Value Date   INR 1.0 ratio 01/06/2009   INR 1.0 05/26/2008   Lab Results  Component Value Date   CKTOTAL 131 05/27/2008   CKMB 2.6 05/27/2008   TROPONINI 0.01        NO INDICATION OF MYOCARDIAL INJURY. 05/27/2008     Lab Results  Component Value Date   CHOL 183 07/07/2014   CHOL 123 04/23/2013   CHOL 181 06/02/2012   Lab Results  Component Value Date   HDL 32* 07/07/2014   HDL 28* 04/23/2013   HDL 31* 06/02/2012   Lab Results  Component Value Date   LDLCALC 128* 07/07/2014   LDLCALC 64 04/23/2013   LDLCALC 107* 06/02/2012   Lab Results  Component Value Date   TRIG 113 07/07/2014   TRIG 153* 04/23/2013   TRIG 217* 06/02/2012   Lab Results  Component Value Date   CHOLHDL 5.7 07/07/2014   CHOLHDL 4.4 04/23/2013   CHOLHDL 5.8 06/02/2012   Lab Results  Component Value Date   LDLDIRECT 196.8 07/11/2010   LDLDIRECT 164.0 04/02/2007   LDLDIRECT 180.8 01/26/2007      Radiology:  Dg Chest 2 View  07/18/2014   CLINICAL DATA:  Acute onset of generalized chest pain and right neck pain. Initial encounter.  EXAM: CHEST  2 VIEW  COMPARISON:  Chest radiograph performed 05/21/2013  FINDINGS: The lungs are well-aerated and  clear. There is no evidence of focal opacification, pleural effusion or pneumothorax.  The heart is borderline normal in size. No acute osseous abnormalities are seen.  IMPRESSION: No acute cardiopulmonary process seen.   Electronically Signed   By: Garald Balding M.D.   On: 07/18/2014 02:40    EKG:  NSR with LVH, first degree AV block and T wave inversions in I, aVL and V4-V6 which are more pronounced than prior EKG.   ASSESSMENT/PLAN: 1.  Unstable angina - symptoms identical to what she had at the time of her stent in 1998 (SOB and diaphoresis) but the CP is new.  Resolved after 2 SL NT.  Her EKG this am shows deepening of the T waves in the anterolateral leads.  Her last cath was in 2004.  Given her symptoms and more pronounced ischemic T wave changes, recommend proceeding with left heart cath today. 2.  ASCAD with remote PCI of the LCX in 1998 and cath 2004 with patent LCX stent and occluded small RCA. 3.  Renal artery stenosis s/p right renal artery stenting 2007 with patent stent by duplex 2011 4.  HTN - controlled on amlodipine, BB and ACE I/diuretic 5.  Dyslipidemia - on crestor and LDL was 128 (goal is <70) 6.  GERD 7.  Hypokalemia -  replete 8.  Palpitations which she say have been intermittent for several months.  Will follow on tele and may need outpt event monitor.  Sueanne Margarita, MD  07/18/2014  9:05 AM

## 2014-07-18 NOTE — Progress Notes (Addendum)
Subjective:   VSS.  No overnight events.  Pt is feeling better and denies any current CP.    States the symptoms are similar to her previous cardiac stent in 1988 except now she has chest pain.    Objective:   Vital signs in last 24 hours: Filed Vitals:   07/18/14 1624 07/18/14 1629 07/18/14 1634 07/18/14 1700  BP: 177/112 177/112 177/112 164/79  Pulse: 80 80 80 71  Temp:    98.4 F (36.9 C)  TempSrc:    Oral  Resp: 24 20 20 14   Height:      Weight:      SpO2: 96% 96% 96% 95%    Weight: Filed Weights   07/18/14 0600  Weight: 96.571 kg (212 lb 14.4 oz)    I/Os:  Intake/Output Summary (Last 24 hours) at 07/18/14 1922 Last data filed at 07/18/14 1900  Gross per 24 hour  Intake    340 ml  Output   1600 ml  Net  -1260 ml    Physical Exam: Constitutional: Vital signs reviewed.  Patient is lying in bed in no acute distress and cooperative with exam.   HEENT: [place]/AT; EOMI, conjunctivae norma, no scleral icterus  Cardiovascular: RRR, no MRG Pulmonary/Chest: normal respiratory effort, no accessory muscle use, CTAB, no wheezes, rales, or rhonchi Abdominal: Obese. Soft. +BS, NT/ND Neurological: A&O x3, CN II-XII grossly intact; non-focal exam Extremities: 2+DP b/l,  no pitting edema Skin: Warm, dry and intact. No rash  Lab Results:  BMP:  Recent Labs Lab 07/18/14 0209 07/18/14 0905  NA 139  --   K 3.1*  --   CL 103  --   CO2 26  --   GLUCOSE 213*  --   BUN 9  --   CREATININE 1.15*  --   CALCIUM 9.0  --   MG  --  2.0    CBC:  Recent Labs Lab 07/18/14 0209  WBC 10.7*  NEUTROABS 8.3*  HGB 11.9*  HCT 36.2  MCV 84.2  PLT 304    Coagulation: No results for input(s): LABPROT, INR in the last 168 hours.  CBG:            Recent Labs Lab 07/18/14 0827 07/18/14 1112 07/18/14 1701  GLUCAP 116* 128* 157*           HA1C:      No results for input(s): HGBA1C in the last 168 hours.  Lipid Panel: No results for input(s): CHOL, HDL, LDLCALC, TRIG,  CHOLHDL, LDLDIRECT in the last 168 hours.  LFTs: No results for input(s): AST, ALT, ALKPHOS, BILITOT, PROT, ALBUMIN in the last 168 hours.  Pancreatic Enzymes: No results for input(s): LIPASE, AMYLASE in the last 168 hours.  Lactic Acid/Procalcitonin: No results for input(s): LATICACIDVEN, PROCALCITON in the last 168 hours.  Ammonia: No results for input(s): AMMONIA in the last 168 hours.  Cardiac Enzymes:  Recent Labs Lab 07/18/14 0905  TROPONINI 0.03    EKG: EKG Interpretation  Date/Time:  Monday July 18 2014 00:22:41 EDT Ventricular Rate:  93 PR Interval:  144 QRS Duration: 92 QT Interval:  388 QTC Calculation: 482 R Axis:   -11 Text Interpretation:  Normal sinus rhythm Left ventricular hypertrophy with repolarization abnormality Abnormal ECG No significant change since last tracing Confirmed by Glynn Octave [phone]) on 07/18/2014 2:54:43 AM   BNP: No results for input(s): PROBNP in the last 168 hours.  D-Dimer: No results for input(s): DDIMER in the last 168 hours.  Urinalysis: No results for input(s): COLORURINE, LABSPEC, PHURINE, GLUCOSEU, HGBUR, BILIRUBINUR, KETONESUR, PROTEINUR, UROBILINOGEN, NITRITE, LEUKOCYTESUR in the last 168 hours.  Invalid input(s): APPERANCEUR  Micro Results: No results found for this or any previous visit (from the past 240 hour(s)).  Blood Culture: No results found for: SDES, SPECREQUEST, CULT, REPTSTATUS  Studies/Results: Dg Chest 2 View  07/18/2014   CLINICAL DATA:  Acute onset of generalized chest pain and right neck pain. Initial encounter.  EXAM: CHEST  2 VIEW  COMPARISON:  Chest radiograph performed 05/21/2013  FINDINGS: The lungs are well-aerated and clear. There is no evidence of focal opacification, pleural effusion or pneumothorax.  The heart is borderline normal in size. No acute osseous abnormalities are seen.  IMPRESSION: No acute cardiopulmonary process seen.   Electronically Signed   By: Garald Balding M.D.    On: 07/18/2014 02:40    Medications:  Scheduled Meds: . amLODipine  10 mg Oral Daily  . aspirin  81 mg Oral Daily  . atorvastatin  80 mg Oral q1800  . carvedilol  12.5 mg Oral BID  . [START ON 07/19/2014] clopidogrel  75 mg Oral Q breakfast  . docusate sodium  100 mg Oral Q12H  . enoxaparin (LOVENOX) injection  40 mg Subcutaneous Q24H  . insulin aspart  0-9 Units Subcutaneous TID WC  . levothyroxine  125 mcg Oral QAC breakfast  . polyethylene glycol  17 g Oral Daily  . sodium chloride  3 mL Intravenous Q12H   Continuous Infusions: . sodium chloride 1 mL/kg/hr (07/18/14 1710)  . bivalirudin (ANGIOMAX) infusion 5 mg/mL (Cath Lab,ACS,PCI indication) 1 mg/kg/hr (07/18/14 1850)   PRN Meds: sodium chloride, acetaminophen, morphine injection, ondansetron (ZOFRAN) IV, sodium chloride  Antibiotics: Antibiotics Given (last 72 hours)    None      Day of Hospitalization:   Consults: Treatment Team:  Rounding Lbcardiology, MD  Assessment/Plan:   Principal Problem:   Chest pain Active Problems:   Hypothyroidism   Hyperlipidemia   Essential hypertension   Coronary atherosclerosis   Diabetes mellitus type 2, controlled, without complications   Unstable angina  Chest pain Currently no CP-->resolved with NTG.  Consulted cards and plan to do cath today. -cath today, results pending -cont current medical mgmt -[facility] on tele   History of CAD s/p stenting -per above  Controlled DMII Last HA1c 6.9. Lab Results  Component Value Date   HGBA1C 6.9 07/07/2014  -SSI -cbgs  HTN -cont home meds, except hold lisinopril in setting of cath   Dyslipidemia Crestor is home med for dyslipidemia. -d/c crestor -add atorvastatin 80mg    Hypothyroidism Last TSH 3.682.  Lab Results  Component Value Date   TSH 3.682 07/07/2014  -cont current dose   F/E/N Fluids- NS per cards  Electrolytes- Replete as needed  Nutrition- HH/Carb mod diet   VTE PPx lovenox 40mg   qd  Disposition Disposition is deferred, awaiting improvement of current medical problems.  Anticipated discharge in approximately 1-2 day(s).     LOS: 0 days   Jones Bales, MD PGY-2, Internal Medicine Teaching Service 07/18/2014, 7:22 PM

## 2014-07-18 NOTE — Consult Note (Signed)
Admit date: 07/18/2014 Referring Physician  Dr. Eppie Gibson Primary Physician  Dr. Heber Wildrose Primary Cardiologist  Dr. Burt Knack Reason for Consultation  Chest pain  HPI: This is a 69yo female with a history of ASCAD s/p PCI of LCx and chronically occluded small RCA and last cath 2004 with patent LCx stent, renal artery stenosis s/p right renal artery stenting in 2007 with last renal arterial duplex in 2011 with patent stent, HTN, type 2 DM, dyslipidemia and GERD who presented to the ER with complaints of chest pain which started last night around 10pm.  She had been seen in the ER earlier that day for some constipation and was sent home.  She was doing laundry at the time and started to feel nauseated.  She sat down and then laid down. and developed SOB and severe heavy pressure in her chest with radiation to her back and right neck and had to sit up.  She broke out in a sweat.  She also felt like her heart was fluttering.   She took 2 baby ASA and 1 SL NTG but pain persisted so she took another SL NTG and CP subsided after about 10 minutes. She was transferred to the ER via EMS.  On arrival in ER she was pain free.  Of note, she had similar symptoms of SOB and diaphoresis in 1998 at which time she got PCI to the left circ.  She has not had any further CP and initial troponin is normal.  Cardiology is now asked to evaluate.     PMH:   Past Medical History  Diagnosis Date  . Cervical polyp   . Neck mass   . Cervical lymphadenopathy   . Urinary tract infection   . Hyperlipidemia     takes Crestor daily  . Coronary artery disease     Prior stent of the lCX with chronic total occlusion of a small RCA. Last cath in 2004 showing patent stent, nonobstructive disease and total RCA. EF is 65 to 70%. She has been managed medically.   . Tubular adenoma of rectum 10/24/04  . Diabetes mellitus without complication     takes Metformin daily  . Hypothyroidism     takes SYnthroid daily  . Hypertension     takes  Amlodipine,Coreg,and Prinzide daily  . TIA (transient ischemic attack)   . Back pain     reason unknown  . History of colon polyps   . Depression     sees a Social worker at [facility] but no meds  . Umbilical hernia   . OSA (obstructive sleep apnea)     PT DENIES, STATES SHE WAS TOLD SHE DIDN'T HAVE OSA, NO CPAP  . PTSD (post-traumatic stress disorder)   . Renal artery stenosis   . Stroke     TIA- LAST ONE GREATER THAN 10 YEARS PER PT     PSH:   Past Surgical History  Procedure Laterality Date  . Cardiac catheterization  2005/2006/2007  . Colonoscopy  10/2004  . Appendectomy    . Ankle surgery Left     plates and screws  . Coronary angioplasty      x 1  . Umbilical hernia repair    . Umbilical hernia repair N/A 05/27/2013    Procedure: HERNIA REPAIR UMBILICAL ADULT;  Surgeon: Imogene Burn. Georgette Dover, MD;  Location: [facility];  Service: General;  Laterality: N/A;  . Insertion of mesh N/A 05/27/2013    Procedure: INSERTION OF MESH;  Surgeon: Imogene Burn. Tsuei,  MD;  Location: [place];  Service: General;  Laterality: N/A;    Allergies:  Review of patient's allergies indicates no known allergies. Prior to Admit Meds:   Prescriptions prior to admission  Medication Sig Dispense Refill Last Dose  . amLODipine (NORVASC) 10 MG tablet Take 1 tablet (10 mg total) by mouth daily. 90 tablet 0 07/17/2014 at Unknown time  . aspirin 325 MG tablet Take 325 mg by mouth daily.     07/17/2014 at Unknown time  . carvedilol (COREG) 25 MG tablet TAKE ONE-HALF TABLET BY MOUTH TWICE DAILY 30 tablet 10 07/17/2014 at 0900  . docusate sodium (COLACE) 100 MG capsule Take 1 capsule (100 mg total) by mouth every 12 (twelve) hours. 30 capsule 0   . levothyroxine (SYNTHROID, LEVOTHROID) 125 MCG tablet TAKE ONE TABLET BY MOUTH ONCE DAILY BEFORE BREAKFAST 30 tablet 5 07/17/2014 at Unknown time  . lisinopril-hydrochlorothiazide (PRINZIDE,ZESTORETIC) 20-12.5 MG per tablet Take 1 tablet by mouth 2 (two) times daily. 60 tablet 11  07/17/2014 at Unknown time  . metFORMIN (GLUCOPHAGE XR) 500 MG 24 hr tablet Take 1 tablet (500 mg total) by mouth daily with breakfast. 30 tablet 11 07/17/2014 at Unknown time  . nitroGLYCERIN (NITROSTAT) 0.4 MG SL tablet Place 0.4 mg under the tongue every 5 (five) minutes as needed for chest pain. Up to 3 doses   not started  . polyethylene glycol powder (MIRALAX) powder TAKE 6 CAPFULS OF MIRALAX IN A 32 OUNCE GATORADE AND DRINK THE WHOLE BEVERAGE FOLLOWED BY 3 CAPFULS TWICE A DAY FOR THE NEXT WEEK AND FOLLOW UP WITH YOUR PRIMARY CARE PHYSICIAN. 255 g 0   . rosuvastatin (CRESTOR) 10 MG tablet Take 1 tablet (10 mg total) by mouth at bedtime. 30 tablet 11 07/16/2014 at Unknown time   Fam HX:    Family History  Problem Relation Age of Onset  . Hypertension Mother   . Osteoarthritis Mother   . Alzheimer's disease Father   . Cancer Neg Hx   . Stroke Neg Hx   . Colon cancer Neg Hx   . Esophageal cancer Neg Hx   . Stomach cancer Neg Hx   . Rectal cancer Neg Hx    Social HX:    History   Social History  . Marital Status: Single    Spouse Name: N/A  . Number of Children: N/A  . Years of Education: N/A   Occupational History  . Not on file.   Social History Main Topics  . Smoking status: Former Research scientist (life sciences)  . Smokeless tobacco: Never Used     Comment: quit smoking about 30 yrs ago  . Alcohol Use: 0.0 oz/week    0 Standard drinks or equivalent per week     Comment: Wine on special occasions  . Drug Use: No  . Sexual Activity: Not on file   Other Topics Concern  . Not on file   Social History Narrative     ROS:  All 11 ROS were addressed and are negative except what is stated in the HPI  Physical Exam: Blood pressure 134/69, pulse 76, temperature 98.9 F (37.2 C), temperature source Oral, resp. rate 22, height 5\' 5"  (1.651 m), weight 212 lb 14.4 oz (96.571 kg), SpO2 97 %.    General: Well developed, well nourished, in no acute distress Head: Eyes PERRLA, No xanthomas.   Normal  cephalic and atramatic  Lungs:   Clear bilaterally to auscultation and percussion. Heart:   HRRR S1 S2 Pulses are 2+ &  equal.            No carotid bruit. No JVD.  No abdominal bruits. No femoral bruits. Abdomen: Bowel sounds are positive, abdomen soft and non-tender without masses  Extremities:   No clubbing, cyanosis or edema.  DP +1 Neuro: Alert and oriented X 3. Psych:  Good affect, responds appropriately    Labs:   Lab Results  Component Value Date   WBC 10.7* 07/18/2014   HGB 11.9* 07/18/2014   HCT 36.2 07/18/2014   MCV 84.2 07/18/2014   PLT 304 07/18/2014    Recent Labs Lab 07/18/14 0209  NA 139  K 3.1*  CL 103  CO2 26  BUN 9  CREATININE 1.15*  CALCIUM 9.0  GLUCOSE 213*   No results found for: PTT Lab Results  Component Value Date   INR 1.0 ratio 01/06/2009   INR 1.0 05/26/2008   Lab Results  Component Value Date   CKTOTAL 131 05/27/2008   CKMB 2.6 05/27/2008   TROPONINI 0.01        NO INDICATION OF MYOCARDIAL INJURY. 05/27/2008     Lab Results  Component Value Date   CHOL 183 07/07/2014   CHOL 123 04/23/2013   CHOL 181 06/02/2012   Lab Results  Component Value Date   HDL 32* 07/07/2014   HDL 28* 04/23/2013   HDL 31* 06/02/2012   Lab Results  Component Value Date   LDLCALC 128* 07/07/2014   LDLCALC 64 04/23/2013   LDLCALC 107* 06/02/2012   Lab Results  Component Value Date   TRIG 113 07/07/2014   TRIG 153* 04/23/2013   TRIG 217* 06/02/2012   Lab Results  Component Value Date   CHOLHDL 5.7 07/07/2014   CHOLHDL 4.4 04/23/2013   CHOLHDL 5.8 06/02/2012   Lab Results  Component Value Date   LDLDIRECT 196.8 07/11/2010   LDLDIRECT 164.0 04/02/2007   LDLDIRECT 180.8 01/26/2007      Radiology:  Dg Chest 2 View  07/18/2014   CLINICAL DATA:  Acute onset of generalized chest pain and right neck pain. Initial encounter.  EXAM: CHEST  2 VIEW  COMPARISON:  Chest radiograph performed 05/21/2013  FINDINGS: The lungs are well-aerated and  clear. There is no evidence of focal opacification, pleural effusion or pneumothorax.  The heart is borderline normal in size. No acute osseous abnormalities are seen.  IMPRESSION: No acute cardiopulmonary process seen.   Electronically Signed   By: Garald Balding M.D.   On: 07/18/2014 02:40    EKG:  NSR with LVH, first degree AV block and T wave inversions in I, aVL and V4-V6 which are more pronounced than prior EKG.   ASSESSMENT/PLAN: 1.  Unstable angina - symptoms identical to what she had at the time of her stent in 1998 (SOB and diaphoresis) but the CP is new.  Resolved after 2 SL NT.  Her EKG this am shows deepening of the T waves in the anterolateral leads.  Her last cath was in 2004.  Given her symptoms and more pronounced ischemic T wave changes, recommend proceeding with left heart cath today. 2.  ASCAD with remote PCI of the LCX in 1998 and cath 2004 with patent LCX stent and occluded small RCA. 3.  Renal artery stenosis s/p right renal artery stenting 2007 with patent stent by duplex 2011 4.  HTN - controlled on amlodipine, BB and ACE I/diuretic 5.  Dyslipidemia - on crestor and LDL was 128 (goal is <70) 6.  GERD 7.  Hypokalemia -  replete 8.  Palpitations which she say have been intermittent for several months.  Will follow on tele and may need outpt event monitor.  Sueanne Margarita, MD  07/18/2014  9:05 AM

## 2014-07-18 NOTE — H&P (Signed)
Date: 07/18/2014               Patient Name:  [name] MRN: [id number]  DOB: 04/07/1945 Age / Sex: 69 y.o., female   PCP: Lucious Groves, DO         Medical Service: Internal Medicine Teaching Service         Attending Physician: Dr. Eppie Gibson    First Contact: Dr. Ethelene Hal Pager: [phone]  Second Contact: Dr. Gordy Levan Pager: [phone]       After Hours (After 5p/  First Contact Pager: [phone]  weekends / holidays): Second Contact Pager: [phone]   Chief Complaint: chest pain  History of Present Illness: Pt is a 69 y/o F w/ PMHx of HTN, HLD, CAD, hypothyroidism, and hx of MI who presents with chest pain. Chest pain is located started around 10:00pm. She was doing laundry and felt nauseous. She then sat down and had 7/10 chest pain that started at her midsternum radiated to her back. She then laid down and had to sit back up due to SOB and heavy pressure sensation on chest. She was also diaphoretic and felt like her heart was fluttering. She denies weakness or tingling in her hands, she did have rt sided neck pain. She took 2 baby asa and 1 ng without much improvement in pain. She then took another NG and chest pain started subsiding around 66mins while she was en route to the ED. She currently denies any chest pain. She experienced this symptoms before without the chest pain ( SOB and diaphoresis) and was admitted w/ resultant PCI w/ stenting in left circumflex in 1998. She follows w/ Dr. Burt Knack w/ cardiology. Pt was admitted for chest pain rule out. Point of care troponin negative.   Meds: Current Facility-Administered Medications  Medication Dose Route Frequency Provider Last Rate Last Dose  . nitroGLYCERIN (NITROSTAT) SL tablet 0.4 mg  0.4 mg Sublingual Q5 min PRN Everlene Balls, MD       Current Outpatient Prescriptions  Medication Sig Dispense Refill  . amLODipine (NORVASC) 10 MG tablet Take 1 tablet (10 mg total) by mouth daily. 90 tablet 0  . aspirin 325 MG tablet Take 325 mg by mouth  daily.      . carvedilol (COREG) 25 MG tablet TAKE ONE-HALF TABLET BY MOUTH TWICE DAILY 30 tablet 10  . docusate sodium (COLACE) 100 MG capsule Take 1 capsule (100 mg total) by mouth every 12 (twelve) hours. 30 capsule 0  . levothyroxine (SYNTHROID, LEVOTHROID) 125 MCG tablet TAKE ONE TABLET BY MOUTH ONCE DAILY BEFORE BREAKFAST 30 tablet 5  . lisinopril-hydrochlorothiazide (PRINZIDE,ZESTORETIC) 20-12.5 MG per tablet Take 1 tablet by mouth 2 (two) times daily. 60 tablet 11  . metFORMIN (GLUCOPHAGE XR) 500 MG 24 hr tablet Take 1 tablet (500 mg total) by mouth daily with breakfast. 30 tablet 11  . nitroGLYCERIN (NITROSTAT) 0.4 MG SL tablet Place 0.4 mg under the tongue every 5 (five) minutes as needed for chest pain. Up to 3 doses    . polyethylene glycol powder (MIRALAX) powder TAKE 6 CAPFULS OF MIRALAX IN A 32 OUNCE GATORADE AND DRINK THE WHOLE BEVERAGE FOLLOWED BY 3 CAPFULS TWICE A DAY FOR THE NEXT WEEK AND FOLLOW UP WITH YOUR PRIMARY CARE PHYSICIAN. 255 g 0  . rosuvastatin (CRESTOR) 10 MG tablet Take 1 tablet (10 mg total) by mouth at bedtime. 30 tablet 11    Allergies: Allergies as of 07/18/2014  . (No Known Allergies)   Past Medical History  Diagnosis Date  . Cervical polyp   . Neck mass   . Cervical lymphadenopathy   . Urinary tract infection   . Hyperlipidemia     takes Crestor daily  . Coronary artery disease     Prior stent of the lCX with chronic total occlusion of a small RCA. Last cath in 2004 showing patent stent, nonobstructive disease and total RCA. EF is 65 to 70%. She has been managed medically.   . Tubular adenoma of rectum 10/24/04  . Diabetes mellitus without complication     takes Metformin daily  . Hypothyroidism     takes SYnthroid daily  . Hypertension     takes Amlodipine,Coreg,and Prinzide daily  . TIA (transient ischemic attack)   . Back pain     reason unknown  . History of colon polyps   . Depression     sees a Social worker at [facility] but no meds    . Umbilical hernia   . OSA (obstructive sleep apnea)     PT DENIES, STATES SHE WAS TOLD SHE DIDN'T HAVE OSA, NO CPAP  . PTSD (post-traumatic stress disorder)   . Renal artery stenosis   . Stroke     TIA- LAST ONE GREATER THAN 10 YEARS PER PT   Past Surgical History  Procedure Laterality Date  . Cardiac catheterization  2005/2006/2007  . Colonoscopy  10/2004  . Appendectomy    . Ankle surgery Left     plates and screws  . Coronary angioplasty      x 1  . Umbilical hernia repair    . Umbilical hernia repair N/A 05/27/2013    Procedure: HERNIA REPAIR UMBILICAL ADULT;  Surgeon: Imogene Burn. Georgette Dover, MD;  Location: [facility];  Service: General;  Laterality: N/A;  . Insertion of mesh N/A 05/27/2013    Procedure: INSERTION OF MESH;  Surgeon: Imogene Burn. Georgette Dover, MD;  Location: [place] [facility];  Service: General;  Laterality: N/A;   Family History  Problem Relation Age of Onset  . Hypertension Mother   . Osteoarthritis Mother   . Alzheimer's disease Father   . Cancer Neg Hx   . Stroke Neg Hx   . Colon cancer Neg Hx   . Esophageal cancer Neg Hx   . Stomach cancer Neg Hx   . Rectal cancer Neg Hx    History   Social History  . Marital Status: Single    Spouse Name: N/A  . Number of Children: N/A  . Years of Education: N/A   Occupational History  . Not on file.   Social History Main Topics  . Smoking status: Former Research scientist (life sciences)  . Smokeless tobacco: Never Used     Comment: quit smoking about 30 yrs ago  . Alcohol Use: 0.0 oz/week    0 Standard drinks or equivalent per week     Comment: Wine on special occasions  . Drug Use: No  . Sexual Activity: Not on file   Other Topics Concern  . Not on file   Social History Narrative    Review of Systems: Pertinent items are noted in HPI.  Physical Exam: Blood pressure 128/67, pulse 83, temperature 98.8 F (37.1 C), temperature source Oral, resp. rate 25, SpO2 99 %. Physical Exam  Constitutional: She appears well-developed and well-nourished. No  distress.  HENT:  Mouth/Throat: Oropharynx is clear and moist.  Eyes: Pupils are equal, round, and reactive to light.  Cardiovascular: Normal rate and regular rhythm.   No murmur heard. Pulses:  Dorsalis pedis pulses are 2+ on the right side, and 1+ on the left side.  Pulmonary/Chest: Effort normal and breath sounds normal. She has no wheezes. She exhibits no tenderness.  Abdominal: Soft. Bowel sounds are normal. There is no tenderness.  Musculoskeletal: She exhibits no edema.  Skin: Skin is warm and dry.    Lab results: Basic Metabolic Panel:  Recent Labs  07/18/14 0209  NA 139  K 3.1*  CL 103  CO2 26  GLUCOSE 213*  BUN 9  CREATININE 1.15*  CALCIUM 9.0   CBC:  Recent Labs  07/18/14 0209  WBC 10.7*  NEUTROABS 8.3*  HGB 11.9*  HCT 36.2  MCV 84.2  PLT 304    Imaging results:  Dg Chest 2 View  07/18/2014   CLINICAL DATA:  Acute onset of generalized chest pain and right neck pain. Initial encounter.  EXAM: CHEST  2 VIEW  COMPARISON:  Chest radiograph performed 05/21/2013  FINDINGS: The lungs are well-aerated and clear. There is no evidence of focal opacification, pleural effusion or pneumothorax.  The heart is borderline normal in size. No acute osseous abnormalities are seen.  IMPRESSION: No acute cardiopulmonary process seen.   Electronically Signed   By: Garald Balding M.D.   On: 07/18/2014 02:40    Other results: EKG Interpretation  Date/Time:  Monday July 18 2014 00:22:41 EDT Ventricular Rate:  93 PR Interval:  144 QRS Duration: 92 QT Interval:  388 QTC Calculation: 482 R Axis:   -11 Text Interpretation:  Normal sinus rhythm Left ventricular hypertrophy with repolarization abnormality Abnormal ECG No significant change since last tracing Confirmed by Glynn Octave [phone]) on 07/18/2014 2:54:43 AM T wave changes presents, previously had T wave inversion in V5-6, I, and aVL  Assessment & Plan by Problem: Active Problems:   * No active hospital  problems. *  Chest pain--pt presents with chest pain that resolved following 2 NG. followed by Dr. Burt Knack w/ cardiology. Low risk nuclear stress test in 2009 w/ no significant ischemia. She had a cardiac cath in 12/2005 which demonstrated 30% distal left main stenosis, 50% stenosis of the LADat the origin of the second diagonal, circumflex with a widely patent stent in the mid vessel, RCA with long 90% stenosis proximally in a 2-mm vessel and the distal vessel filled with robust left-to-rightcollaterals. This was unchanged from 2005. Her EF at the time was 65%. She is currently chest pain free. TIMI score 4 indicating 20% risk at 14 day all cause mortality. Will admit for chest pain rule out.  - admit to tele - cycle troponins - EKG in the am - NG for chest pain  AKI-- creatinine 1.15, b/l around 0.8. Pt states her po intake has been good. Possible that AKI from diuretic use as she is on lisinopril/HCTZ BID although she has been on this for a while.  - CMET in the am - hold lisinopril/HCTZ - NS w/ KCl 79mEq at 100 cc/hr  DM-- on metformin 500mg  daily - SSI  HTN-- had renal artery stenosis w/ rt renal artery stenting in 2007. Normotensive.  -cont norvasc and coreg -hold ACE/thiazide  Hypothyroidism-- cont synthroid 169mcg  HLD-- cont crestor 10mg   FEN:  - diet : h/h, carb mod - hypokalemia, K 3.1 on admission, repleted w/ NS w/ KCl 74mEq and KDur 80mEq  - mag level ordered  DVT ppx: lovenox  CODE: FULL  Dispo: Disposition is deferred at this time, awaiting improvement of current medical problems.   The  patient does have a current PCP Lucious Groves, DO) and does need an [facility] hospital follow-up appointment after discharge.  The patient does not have transportation limitations that hinder transportation to clinic appointments.  Signed: Norman Herrlich, MD 07/18/2014, 5:02 AM

## 2014-07-18 NOTE — ED Notes (Signed)
PER EMS: pt from home, reports CP began around 2240 while doing non-strenuous household chores. She described it was substernal chest heaviness that radiated to her back with shortness of breath and nausea. She took 2 nitro PTA and reports she is chest pain and nauseous free but still reports SOB. 324 asa PTA.. BP-121/74, HR-92, RR-18 unlabored, 99% 2L [place]. A&OX4.

## 2014-07-18 NOTE — ED Notes (Signed)
Dr. Oni at bedside. 

## 2014-07-18 NOTE — ED Provider Notes (Signed)
CSN: [id number]     Arrival date & time 07/18/14  0018 History   None    This chart was scribed for Everlene Balls, MD by Forrestine Him, ED Scribe. This patient was seen in room [facility] and the patient's care was started 1:44 AM.   Chief Complaint  Patient presents with  . Chest Pain   The history is provided by the patient. No language interpreter was used.    HPI Comments: Janene Yousuf brought in by EMS is a 69 y.o. female with a PMHx of hyperlipidemia, CARD, DM, HTN, TIA, and stroke who presents to the Emergency Department complaining of intermittent, ongoing central chest pain onset 62 while doing some household chores this evening. Pt also reports associated shortness of breath, nausea, and diaphoresis. No OTC medicaitons or home remedies attempted prior to arrival. No recent fever, chills, vomiting, or abdominal pain.  PSHx includes stent placement 15-20 years ago. NO known allergies to medications.  Past Medical History  Diagnosis Date  . Cervical polyp   . Neck mass   . Cervical lymphadenopathy   . Urinary tract infection   . Hyperlipidemia     takes Crestor daily  . Coronary artery disease     Prior stent of the lCX with chronic total occlusion of a small RCA. Last cath in 2004 showing patent stent, nonobstructive disease and total RCA. EF is 65 to 70%. She has been managed medically.   . Tubular adenoma of rectum 10/24/04  . Diabetes mellitus without complication     takes Metformin daily  . Hypothyroidism     takes SYnthroid daily  . Hypertension     takes Amlodipine,Coreg,and Prinzide daily  . TIA (transient ischemic attack)   . Back pain     reason unknown  . History of colon polyps   . Depression     sees a Social worker at [facility] but no meds  . Umbilical hernia   . OSA (obstructive sleep apnea)     PT DENIES, STATES SHE WAS TOLD SHE DIDN'T HAVE OSA, NO CPAP  . PTSD (post-traumatic stress disorder)   . Renal artery stenosis   . Stroke     TIA- LAST ONE  GREATER THAN 10 YEARS PER PT   Past Surgical History  Procedure Laterality Date  . Cardiac catheterization  2005/2006/2007  . Colonoscopy  10/2004  . Appendectomy    . Ankle surgery Left     plates and screws  . Coronary angioplasty      x 1  . Umbilical hernia repair    . Umbilical hernia repair N/A 05/27/2013    Procedure: HERNIA REPAIR UMBILICAL ADULT;  Surgeon: Imogene Burn. Georgette Dover, MD;  Location: [place];  Service: General;  Laterality: N/A;  . Insertion of mesh N/A 05/27/2013    Procedure: INSERTION OF MESH;  Surgeon: Imogene Burn. Georgette Dover, MD;  Location: [facility];  Service: General;  Laterality: N/A;   Family History  Problem Relation Age of Onset  . Hypertension Mother   . Osteoarthritis Mother   . Alzheimer's disease Father   . Cancer Neg Hx   . Stroke Neg Hx   . Colon cancer Neg Hx   . Esophageal cancer Neg Hx   . Stomach cancer Neg Hx   . Rectal cancer Neg Hx    History  Substance Use Topics  . Smoking status: Former Research scientist (life sciences)  . Smokeless tobacco: Never Used     Comment: quit smoking about 30 yrs ago  . Alcohol  Use: 0.0 oz/week    0 Standard drinks or equivalent per week     Comment: Wine on special occasions   OB History    No data available     Review of Systems  Constitutional: Negative for fever and chills.  Respiratory: Negative for cough and shortness of breath.   Cardiovascular: Positive for chest pain. Negative for leg swelling.  Gastrointestinal: Positive for nausea. Negative for vomiting and diarrhea.  Neurological: Negative for dizziness and headaches.  All other systems reviewed and are negative.     Allergies  Review of patient's allergies indicates no known allergies.  Home Medications   Prior to Admission medications   Medication Sig Start Date End Date Taking? Authorizing Provider  amLODipine (NORVASC) 10 MG tablet Take 1 tablet (10 mg total) by mouth daily. 07/06/14   Lucious Groves, DO  aspirin 325 MG tablet Take 325 mg by mouth daily.       Historical Provider, MD  carvedilol (COREG) 25 MG tablet TAKE ONE-HALF TABLET BY MOUTH TWICE DAILY 05/23/14   Sherren Mocha, MD  docusate sodium (COLACE) 100 MG capsule Take 1 capsule (100 mg total) by mouth every 12 (twelve) hours. 07/17/14   Leo Grosser, MD  levothyroxine (SYNTHROID, LEVOTHROID) 125 MCG tablet TAKE ONE TABLET BY MOUTH ONCE DAILY BEFORE BREAKFAST 12/13/13   Lucious Groves, DO  lisinopril-hydrochlorothiazide (PRINZIDE,ZESTORETIC) 20-12.5 MG per tablet Take 1 tablet by mouth 2 (two) times daily. 07/07/14   Lucious Groves, DO  metFORMIN (GLUCOPHAGE XR) 500 MG 24 hr tablet Take 1 tablet (500 mg total) by mouth daily with breakfast. 07/07/14 07/07/15  Lucious Groves, DO  nitroGLYCERIN (NITROSTAT) 0.4 MG SL tablet Place 0.4 mg under the tongue every 5 (five) minutes as needed for chest pain. Up to 3 doses    Historical Provider, MD  polyethylene glycol powder (MIRALAX) powder TAKE 6 CAPFULS OF MIRALAX IN A 32 OUNCE GATORADE AND DRINK THE WHOLE BEVERAGE FOLLOWED BY 3 CAPFULS TWICE A DAY FOR THE NEXT WEEK AND FOLLOW UP WITH YOUR PRIMARY CARE PHYSICIAN. 07/17/14   Leo Grosser, MD  rosuvastatin (CRESTOR) 10 MG tablet Take 1 tablet (10 mg total) by mouth at bedtime. 07/07/14 07/07/15  Lucious Groves, DO   Triage Vitals: BP 131/71 mmHg  Pulse 91  Temp(Src) 98.8 F (37.1 C) (Oral)  SpO2 98%   Physical Exam  Constitutional: She is oriented to person, place, and time. She appears well-developed and well-nourished. No distress.  HENT:  Head: Normocephalic and atraumatic.  Nose: Nose normal.  Mouth/Throat: Oropharynx is clear and moist. No oropharyngeal exudate.  Eyes: Conjunctivae and EOM are normal. Pupils are equal, round, and reactive to light. No scleral icterus.  Neck: Normal range of motion. Neck supple. No JVD present. No tracheal deviation present. No thyromegaly present.  Cardiovascular: Normal rate, regular rhythm and normal heart sounds.  Exam reveals no gallop and no friction rub.   No  murmur heard. Pulmonary/Chest: Effort normal and breath sounds normal. No respiratory distress. She has no wheezes. She exhibits no tenderness.  Abdominal: Soft. Bowel sounds are normal. She exhibits no distension and no mass. There is no tenderness. There is no rebound and no guarding.  Musculoskeletal: Normal range of motion. She exhibits no edema or tenderness.  Lymphadenopathy:    She has no cervical adenopathy.  Neurological: She is alert and oriented to person, place, and time. No cranial nerve deficit. She exhibits normal muscle tone.  Skin: Skin is warm and  dry. No rash noted. No erythema. No pallor.  Nursing note and vitals reviewed.   ED Course  Procedures (including critical care time)  DIAGNOSTIC STUDIES: Oxygen Saturation is 98% on RA, Normal by my interpretation.    COORDINATION OF CARE: 1:44 AM- Will give Nitrostat. Will order CXR, i-stat troponin, CBC, BMP, and EKG. Discussed treatment plan with pt at bedside and pt agreed to plan.     Labs Review Labs Reviewed  CBC WITH DIFFERENTIAL/PLATELET - Abnormal; Notable for the following:    WBC 10.7 (*)    Hemoglobin 11.9 (*)    Neutrophils Relative % 78 (*)    Neutro Abs 8.3 (*)    All other components within normal limits  BASIC METABOLIC PANEL - Abnormal; Notable for the following:    Potassium 3.1 (*)    Glucose, Bld 213 (*)    Creatinine, Ser 1.15 (*)    GFR calc non Af Amer 48 (*)    GFR calc Af Amer 56 (*)    All other components within normal limits  I-STAT TROPOININ, ED    Imaging Review Dg Chest 2 View  07/18/2014   CLINICAL DATA:  Acute onset of generalized chest pain and right neck pain. Initial encounter.  EXAM: CHEST  2 VIEW  COMPARISON:  Chest radiograph performed 05/21/2013  FINDINGS: The lungs are well-aerated and clear. There is no evidence of focal opacification, pleural effusion or pneumothorax.  The heart is borderline normal in size. No acute osseous abnormalities are seen.  IMPRESSION: No acute  cardiopulmonary process seen.   Electronically Signed   By: Garald Balding M.D.   On: 07/18/2014 02:40     EKG Interpretation   Date/Time:  Monday July 18 2014 00:22:41 EDT Ventricular Rate:  93 PR Interval:  144 QRS Duration: 92 QT Interval:  388 QTC Calculation: 482 R Axis:   -11 Text Interpretation:  Normal sinus rhythm Left ventricular hypertrophy  with repolarization abnormality Abnormal ECG No significant change since  last tracing Confirmed by Glynn Octave [id number]) on 07/18/2014  2:54:43 AM      MDM   Final diagnoses:  None   PAtient presents with CP, history is consistent with ACS. She also states this feels like prior MI.  She was given ASA and nitro in the ED.  She has new TWI in V4-V6.  She will require admission for further work up.   I personally performed the services described in this documentation, which was scribed in my presence. The recorded information has been reviewed and is accurate.   Everlene Balls, MD 07/18/14 1525

## 2014-07-18 NOTE — Progress Notes (Signed)
PATIENT ARRIVED TO TELE UNIT FROM E.D. VIA STRETCHER. TRANSFERRED TO BED. TELE APPLIED. VITALS OBTAINED. ASSESSMENT PERFORMED.  PATIENT INSTRUCTED TO CALL FOR ASSISTANCE WHEN NEEDED.

## 2014-07-18 NOTE — Interval H&P Note (Signed)
Cath Lab Visit (complete for each Cath Lab visit)  Clinical Evaluation Leading to the Procedure:   ACS: Yes.    Non-ACS:    Anginal Classification: CCS IV  Anti-ischemic medical therapy: Maximal Therapy (2 or more classes of medications)  Non-Invasive Test Results: No non-invasive testing performed  Prior CABG: No previous CABG      History and Physical Interval Note:  07/18/2014 2:55 PM  [name]  has presented today for surgery, with the diagnosis of cp  The various methods of treatment have been discussed with the patient and family. After consideration of risks, benefits and other options for treatment, the patient has consented to  Procedure(s): Left Heart Cath and Coronary Angiography (N/A) as a surgical intervention .  The patient's history has been reviewed, patient examined, no change in status, stable for surgery.  I have reviewed the patient's chart and labs.  Questions were answered to the patient's satisfaction.     Quay Burow

## 2014-07-18 NOTE — Progress Notes (Signed)
Subjective: Patient denies chest pain, dizziness or SOB this morning. States she is feeling well. Will proceed with left heart cath today, per cardiology.   Objective: Vital signs in last 24 hours: Filed Vitals:   07/18/14 0500 07/18/14 0530 07/18/14 0600 07/18/14 1024  BP: 120/56 133/65 134/69 159/76  Pulse: 78 78 76   Temp:   98.9 F (37.2 C)   TempSrc:   Oral   Resp: 23 22 22    Height:   5\' 5"  (1.651 m)   Weight:   96.571 kg (212 lb 14.4 oz)   SpO2: 97% 96% 97%    General: obese, lying in bed, in no acute distress  CV: RRR, normal S1, S2, no murmurs, rubs or gallops Pulm: CTAB, no wheezing, no crackles, normal respiratory effort  Abdomen: obese, +BS, non-tender, non-distended Extremities: no peripheral edema, warm, well perfused  Skin: no rashes  Lab ResulStudies/Results: Basic Metabolic Panel:  Recent Labs  07/18/14 0209 07/18/14 0905  NA 139  --   K 3.1*  --   CL 103  --   CO2 26  --   GLUCOSE 213*  --   BUN 9  --   CREATININE 1.15*  --   CALCIUM 9.0  --   MG  --  2.0    CBC:  Recent Labs  07/18/14 0209  WBC 10.7*  NEUTROABS 8.3*  HGB 11.9*  HCT 36.2  MCV 84.2  PLT 304   Cardiac Enzymes:  Recent Labs  07/18/14 0905  TROPONINI 0.03   CBG:  Recent Labs  07/18/14 0827 07/18/14 1112  GLUCAP 116* 128*   Urine Drug Screen: Drugs of Abuse     Component Value Date/Time   LABOPIA NONE DETECTED 07/18/2014 1010   COCAINSCRNUR NONE DETECTED 07/18/2014 1010   LABBENZ NONE DETECTED 07/18/2014 1010   AMPHETMU NONE DETECTED 07/18/2014 1010   THCU NONE DETECTED 07/18/2014 1010   LABBARB NONE DETECTED 07/18/2014 1010    Dg Chest 2 View  07/18/2014   CLINICAL DATA:  Acute onset of generalized chest pain and right neck pain. Initial encounter.  EXAM: CHEST  2 VIEW  COMPARISON:  Chest radiograph performed 05/21/2013  FINDINGS: The lungs are well-aerated and clear. There is no evidence of focal opacification, pleural effusion or pneumothorax.  The  heart is borderline normal in size. No acute osseous abnormalities are seen.  IMPRESSION: No acute cardiopulmonary process seen.   Electronically Signed   By: Garald Balding M.D.   On: 07/18/2014 02:40    EKG Interpretation  Date/Time:  Monday July 18 2014 00:22:41 EDT Ventricular Rate:  93 PR Interval:  144 QRS Duration: 92 QT Interval:  388 QTC Calculation: 482 R Axis:   -11 Text Interpretation:  Normal sinus rhythm Left ventricular hypertrophy with repolarization abnormality Abnormal ECG No significant change since last tracing Confirmed by Glynn Octave [phone]) on 07/18/2014 2:54:43 AM  Medications: I have reviewed the patient's current medications. Scheduled Meds: . amLODipine  10 mg Oral Daily  . aspirin  81 mg Oral Daily  . [START ON 07/19/2014] aspirin  81 mg Oral Pre-Cath  . carvedilol  12.5 mg Oral BID  . docusate sodium  100 mg Oral Q12H  . enoxaparin (LOVENOX) injection  40 mg Subcutaneous Q24H  . insulin aspart  0-9 Units Subcutaneous TID WC  . levothyroxine  125 mcg Oral QAC breakfast  . polyethylene glycol  17 g Oral Daily  . rosuvastatin  10 mg Oral QHS  . sodium chloride  3 mL Intravenous Q12H   Continuous Infusions: . sodium chloride    . 0.9 % NaCl with KCl 20 mEq / L 100 mL/hr at 07/18/14 1020   PRN Meds:.sodium chloride, sodium chloride Assessment/Plan: Principal Problem:   Chest pain Active Problems:   Hypothyroidism   Hyperlipidemia   Essential hypertension   Coronary atherosclerosis   Diabetes mellitus type 2, controlled, without complications  This is a 69 yo female with a PMH of CAD, ACS s/p LCX stenting (1998), HLD, HTN, T2DM, renal artery stenosis s/p stenting in 2007 who presents with chest pain. The pain responded to the NG that she took at home. Her CP had resolved by the time she was seen in the ER. Initial troponin was normal. CXR was negative for any acute cardiopulmonary process. Her admission EKG revealed T wave changes. A repeat  EKG this morning was significant first degree AV block and T wave inversion in V4-V6, I, aVL, which were more pronounced compared to previous.     #Chest Pain patient with multiple CAD risk factors presents with anginal type chest pain, which resolved with NG. She has a h/o ACS s/p stenting of LCX in 1998, when she presented with similar symptoms. Last cath in 2004 revealed patent LCX stent, 90% RCA stenosis, and 50% stenosis of the LAD. She is being followed by Dr. Burt Knack with cardiology. Initial troponin was negative. CXR was unremarkable. EKG on admission revealed T wave changes. Repeat EKG today with first degree AV block, T wave inversions in leads V4-V6, I, and aVL, more pronounced than on previous EKGs. Given her h/o ACS, multiple risk factors, and previous cath showing significant CAD, differential diagnosis includes NSTEMI vs unstable angina. However, NSTEMI is less likely due to negative troponins. Recent T wave changes on EKGs are supportive of possible ischemic event. - cardiology consulted, appreciate recs. Cardiology recommends left heart cath today - NPO, PT/ INR pre-cath - telemetry - Trend troponins  - cnt ASA 81mg   #AKI SCr on admission 1.15, up from baseline at 0.8. Possibly due to home diuretic use vs reduced PO intake.  - trend SCr - holding home lisinopril, HCTZ  - NS with KCl 20 mEq at 100 ml/hr  #DM - holding metformin 500 mg qd - SSI  #HTN Stable. H/o renal artery stenosis with right renal artery stenting in 2007.  - holding home lisinopril, HCTZ  - cnt home carvedilol, amlodipine  #Hypothyroidism Stable -cnt home synthroid 125 mcg  #HLD Most recent lipid panel with cholesterol 183, HDL 32, LDL 128.  - cnt home Crestor 10 mg   #DVT ppx - lovenox 40  #FEN/GI - NPO pre-cath - NS with KCl 20 mEq at 100 ml/hr - replete electrolytes prn - Replete to K >4, Mg >2 - Mg 2.0 - Miralax   #Dispo: admit to IM Teaching services. Anticipated discharge in 1 day.    Code: Full  This is a Careers information officer Note.  The care of the patient was discussed with Dr. Gordy Levan and the assessment and plan formulated with their assistance.  Please see their attached note for official documentation of the daily encounter.     [employer], Med Student 07/18/2014, 12:30 PM

## 2014-07-18 NOTE — ED Notes (Signed)
Report attempted, nurse unavailable at this time.

## 2014-07-19 ENCOUNTER — Encounter (HOSPITAL_COMMUNITY): Payer: Self-pay | Admitting: Cardiovascular Disease

## 2014-07-19 DIAGNOSIS — I10 Essential (primary) hypertension: Secondary | ICD-10-CM

## 2014-07-19 DIAGNOSIS — Z955 Presence of coronary angioplasty implant and graft: Secondary | ICD-10-CM

## 2014-07-19 DIAGNOSIS — E785 Hyperlipidemia, unspecified: Secondary | ICD-10-CM

## 2014-07-19 DIAGNOSIS — I200 Unstable angina: Secondary | ICD-10-CM

## 2014-07-19 LAB — BASIC METABOLIC PANEL
Anion gap: 7 (ref 5–15)
BUN: 11 mg/dL (ref 6–20)
CO2: 26 mmol/L (ref 22–32)
Calcium: 8.4 mg/dL — ABNORMAL LOW (ref 8.9–10.3)
Chloride: 104 mmol/L (ref 101–111)
Creatinine, Ser: 0.79 mg/dL (ref 0.44–1.00)
GFR calc Af Amer: >60 mL/min (ref >60)
GFR calc non Af Amer: >60 mL/min (ref >60)
Glucose, Bld: 143 mg/dL — ABNORMAL HIGH (ref 65–99)
Potassium: 3.7 mmol/L (ref 3.5–5.1)
Sodium: 137 mmol/L (ref 135–145)

## 2014-07-19 LAB — TROPONIN I: Troponin I: <0.03 ng/mL (ref <0.031)

## 2014-07-19 LAB — GLUCOSE, CAPILLARY
Glucose-Capillary: 131 mg/dL — ABNORMAL HIGH (ref 65–99)
Glucose-Capillary: 155 mg/dL — ABNORMAL HIGH (ref 65–99)

## 2014-07-19 LAB — CBC
HCT: 34.7 % — ABNORMAL LOW (ref 36.0–46.0)
Hemoglobin: 11.3 g/dL — ABNORMAL LOW (ref 12.0–15.0)
MCH: 27.6 pg (ref 26.0–34.0)
MCHC: 32.6 g/dL (ref 30.0–36.0)
MCV: 84.6 fL (ref 78.0–100.0)
Platelets: 285 10*3/uL (ref 150–400)
RBC: 4.1 MIL/uL (ref 3.87–5.11)
RDW: 14.3 % (ref 11.5–15.5)
WBC: 7.6 10*3/uL (ref 4.0–10.5)

## 2014-07-19 MED ORDER — ASPIRIN 81 MG PO TABS: 81.0000 mg | ORAL_TABLET | Freq: Every day | ORAL | Status: AC

## 2014-07-19 MED ORDER — CLOPIDOGREL BISULFATE 75 MG PO TABS: 75.0000 mg | ORAL_TABLET | Freq: Every day | ORAL | Status: DC

## 2014-07-19 MED FILL — Heparin Sodium (Porcine) 2 Unit/ML in Sodium Chloride 0.9%: INTRAMUSCULAR | Qty: 1000 | Status: AC

## 2014-07-19 NOTE — Discharge Summary (Signed)
Patient Name: [name]  MRN:  [id number]   DOB: 02-17-1945   PCP: Teresa Groves, DO         Date of Admission: 07/18/2014  Date of Discharge: 07/19/2014        Attending Physician: Dr. Oval [name]       DISCHARGE DIAGNOSES: Primary: Unstable angina   Secondary:  Coronary atherosclerosis Hypothyroidism Hyperlipidemia  Hypertension DMT2  DISPOSITION AND FOLLOW-UP: [name] is to follow-up with the listed providers as detailed below, at which time, the following should be addressed:   1. Follow up with  Dr. Sherren [name] and/or Dr. Heber [name] re possible adjustment of statin dose, given elevated LDL and progression of CAD  2. Labs / imaging needed at time of follow-up: none  3. Pending labs/ test needing follow-up: none    DISCHARGE INSTRUCTIONS: Follow-up Information    Follow up with Teresa Mocha, MD.   Specialty:  Cardiology   Why:  The office will call.   Contact information:   [address] [place] [address] [phone]       Schedule an appointment as soon as possible for a visit with Teresa Groves, DO.   Specialty:  Internal Medicine   Contact information:   [place] [address] [phone]       Follow up with Teresa Groves, DO On 07/26/2014.   Specialty:  Internal Medicine   Why:  at 10:45 am   Contact information:   [place] [address] [phone]      Discharge Instructions    Amb Referral to Cardiac Rehabilitation    Complete by:  As directed   Congestive Heart Failure: If diagnosis is Heart Failure, patient MUST meet each of the CMS criteria: 1. Left Ventricular Ejection Fraction </= 35% 2. NYHA class II-IV symptoms despite being on optimal heart failure therapy for at least 6 weeks. 3. Stable = have not had a recent (<6 weeks) or planned (<6 months) major cardiovascular hospitalization or procedure  Program Details: - Physician supervised classes - 1-3  classes per week over a 12-18 week period, generally for a total of 36 sessions  Physician Certification: I certify that the above Cardiac Rehabilitation treatment is medically necessary and is medically approved by me for treatment of this patient. The patient is willing and cooperative, able to ambulate and medically stable to participate in exercise rehabilitation. The participant's progress and Individualized Treatment Plan will be reviewed by the Medical Director, Cardiac Rehab staff and as indicated by the Referring/Ordering Physician.  Diagnosis:  PCI     Diet - low sodium heart healthy    Complete by:  As directed      Diet Carb Modified    Complete by:  As directed      Increase activity slowly    Complete by:  As directed             DISCHARGE MEDICATIONS:   Medication List    TAKE these medications        amLODipine 10 MG tablet  Commonly known as:  NORVASC  Take 1 tablet (10 mg total) by mouth daily.     aspirin 81 MG tablet  Take 1 tablet (81 mg total) by mouth daily.     carvedilol 25 MG tablet  Commonly known as:  COREG  TAKE ONE-HALF TABLET BY MOUTH TWICE DAILY     clopidogrel 75 MG tablet  Commonly known as:  PLAVIX  Take 1 tablet (75 mg total) by mouth daily with breakfast.     docusate sodium 100 MG capsule  Commonly known as:  COLACE  Take 1 capsule (100 mg total) by mouth every 12 (twelve) hours.     levothyroxine 125 MCG tablet  Commonly known as:  SYNTHROID, LEVOTHROID  TAKE ONE TABLET BY MOUTH ONCE DAILY BEFORE BREAKFAST     lisinopril-hydrochlorothiazide 20-12.5 MG per tablet  Commonly known as:  PRINZIDE,ZESTORETIC  Take 1 tablet by mouth 2 (two) times daily.     metFORMIN 500 MG 24 hr tablet  Commonly known as:  GLUCOPHAGE XR  Take 1 tablet (500 mg total) by mouth daily with breakfast.     nitroGLYCERIN 0.4 MG SL tablet  Commonly known as:  NITROSTAT  Place 0.4 mg under the tongue every 5 (five) minutes as needed for chest pain. Up to  3 doses     polyethylene glycol powder powder  Commonly known as:  MIRALAX  TAKE 6 CAPFULS OF MIRALAX IN A 32 OUNCE GATORADE AND DRINK THE WHOLE BEVERAGE FOLLOWED BY 3 CAPFULS TWICE A DAY FOR THE NEXT WEEK AND FOLLOW UP WITH YOUR PRIMARY CARE PHYSICIAN.     rosuvastatin 10 MG tablet  Commonly known as:  CRESTOR  Take 1 tablet (10 mg total) by mouth at bedtime.         CONSULTS:  Treatment Team:  Rounding [facility], MD    PROCEDURES PERFORMED:  Dg Chest 2 View  07/18/2014   CLINICAL DATA:  Acute onset of generalized chest pain and right neck pain. Initial encounter.  EXAM: CHEST  2 VIEW  COMPARISON:  Chest radiograph performed 05/21/2013  FINDINGS: The lungs are well-aerated and clear. There is no evidence of focal opacification, pleural effusion or pneumothorax.  The heart is borderline normal in size. No acute osseous abnormalities are seen.  IMPRESSION: No acute cardiopulmonary process seen.   Electronically Signed   By: [name] M.D.   On: 07/18/2014 02:40     Cardiac cath: 07/18/2014  Ost RCA to Prox RCA lesion, 100% stenosed.  1st Mrg lesion, 30% stenosed. The lesion was previously treated with a stent (unknown type) .  The left ventricular systolic function is normal.  Ost 1st Mrg to 1st Mrg lesion, 80% stenosed proximal to previously placed stent. There is a 0% residual stenosis post SYNERGY DES 2.5X12 intervention.  EF 55-60 percent, no wall motion abnormalities    EKG: 07/19/2014 Sinus rhythm, rate 65 diffuse lateral ST and T-wave abnormalities, unchanged from March 2016 ECG   ADMISSION DATA: H&P: [name] is a 69 yo female with a PMH of CAD, ACS, s/p LCX stenting (1998), HLD, HTN, T2DM, renal artery stenosis s/p stenting in 2007 who presents with chest pain. The pain was midsternal, radiating to the back, and associated with nausea and diaphoresis. The pain responded to the NG that she took at home. Her CP had resolved by the time she was seen in the  ED. Initial troponin was normal. CXR was negative for any acute cardiopulmonary process. Her admission EKG revealed T wave changes.   Physical Exam: Blood pressure 128/67, pulse 83, temperature 98.8 F (37.1 C), temperature source Oral, resp. rate 25, SpO2 99 %.  Physical Exam  Constitutional: She appears well-developed and well-nourished. No distress.  HENT:  Mouth/Throat: Oropharynx is clear and moist.  Eyes: Pupils are equal, round, and reactive to light.  Cardiovascular: Normal rate and regular rhythm.  No murmur heard. Pulses:  Dorsalis pedis pulses are 2+ on the right side, and 1+ on the left side.  Pulmonary/Chest: Effort normal and breath sounds normal. She has no wheezes. She exhibits no tenderness.  Abdominal: Soft. Bowel sounds are normal. There is no tenderness.  Musculoskeletal: She exhibits no edema.  Skin: Skin is warm and dry.   Labs:  Recent Labs  07/18/14 0209  NA 139  K 3.1*  CL 103  CO2 26  GLUCOSE 213*  BUN 9  CREATININE 1.15*  CALCIUM 9.0     CBC:  Recent Labs (last 2 labs)      Recent Labs  07/18/14 0209  WBC 10.7*  NEUTROABS 8.3*  HGB 11.9*  HCT 36.2  MCV 84.2  PLT 304      Imaging results:   Imaging Results (Last 48 hours)    Dg Chest 2 View  07/18/2014 CLINICAL DATA: Acute onset of generalized chest pain and right neck pain. Initial encounter. EXAM: CHEST 2 VIEW COMPARISON: Chest radiograph performed 05/21/2013 FINDINGS: The lungs are well-aerated and clear. There is no evidence of focal opacification, pleural effusion or pneumothorax. The heart is borderline normal in size. No acute osseous abnormalities are seen. IMPRESSION: No acute cardiopulmonary process seen. Electronically Signed By: [name] M.D. On: 07/18/2014 02:40     Other results: EKG Interpretation  Date/Time:Monday July 18 2014 00:22:41 EDT Ventricular Rate: 93 PR  Interval:144 QRS Duration:92 QT Interval:388 QTC Calculation:482 R Axis:-11 Text Interpretation: Normal sinus rhythm Left ventricular hypertrophy with repolarization abnormality Abnormal ECG No significant change since last tracing Confirmed by Glynn Octave [phone]) on 07/18/2014 2:54:43 AM T wave changes presents, previously had T wave inversion in V5-6, I, and aVL       HOSPITAL COURSE BY PROBLEM LIST:  #Chest Pain The patient presented with anginal type chest pain, which resolved with NG. She has a h/o ACS s/p stenting of LCX in 1998, when she presented with similar symptoms. Last cath in 2004 revealed patent LCX stent, 90% RCA stenosis, and 50% stenosis of the LAD. She is being followed by Dr. Burt Knack with cardiology. Troponins were trended, and remained negative. CXR was unremarkable. EKG on admission revealed T wave changes. Her symptoms were consistent with unstable angina, so she was taken to the cath lab later on 07/18/2014. Cardiac cath on 6/13 showed 100% RCA occlusion, consistent with prior cath results, and 80% stenosis proximal to her previously placed stent, which was treated with a drug eluting stent with complete resolution of stenosis. She tolerated the procedure well. She was started on Plavix 75mg  and should be continued on it for 12 months. Given her elevated LDL on the most recent lipid profile, she should follow up with her PCP or cardiology regarding potentially increasing her statin dose. Prior to discharge, she was given instructions on appropriate lifestyle modification.She was given a referral to a cardiac rehab program. She should follow up with her cardiologist as an outpatient.   #AKI Resolved s/p IV hydration. SCr on admission 1.15, up from baseline at 0.8. Possibly due to home diuretic use vs reduced PO intake. We held her home lisinopril, HCTZ and restarted on discharge.   #DDM  We held her metformin 500 mg qd in setting of AKI and restarted it on discharge.   #HTN She has a h/o renal artery stenosis with right renal artery stenting in 2007.  We held her home lisinopril, HCTZ, which we restarted on discharge. We continued her on her home  carvedilol, amlodipine.  #Hypothyroidism Stable. We continued her on her home synthroid 125 mcg.  #HLD Most recent lipid panel with cholesterol 183, HDL 32, LDL 128. We continued her home Crestor 10 mg. The patient should f/u with PCP re increasing statin dose since LDL is above goal.     DISCHARGE DATA: Vital Signs: BP 154/69 mmHg  Pulse 67  Temp(Src) 98.7 F (37.1 C) (Oral)  Resp 19  Ht 5\' 5"  (1.651 m)  Wt 98.5 kg (217 lb 2.5 oz)  BMI 36.14 kg/m2  SpO2 98%  PE General: lying in bed, in no acute distress  CV: RRR, normal S1, S2, no murmurs, rubs or gallops Pulm: CTAB, no wheezing, no crackles, normal respiratory effort  Abdomen: obese, +BS, non-tender, non-distended Extremities: no peripheral edema, warm, well perfused, radial catheterization site clean, dry, intact, bandaid in place Skin: no rashes  Labs: Results for orders placed or performed during the hospital encounter of 07/18/14 (from the past 24 hour(s))  POCT Activated clotting time     Status: None   Collection Time: 07/18/14  3:48 PM  Result Value Ref Range   Activated Clotting Time 454 seconds  Glucose, capillary     Status: Abnormal   Collection Time: 07/18/14  5:01 PM  Result Value Ref Range   Glucose-Capillary 157 (H) 65 - 99 mg/dL  Troponin I     Status: None   Collection Time: 07/18/14  7:10 PM  Result Value Ref Range   Troponin I <0.03 <0.031 ng/mL  Comprehensive metabolic panel     Status: Abnormal   Collection Time: 07/18/14  7:10 PM  Result Value Ref Range   Sodium 139 135 - 145 mmol/L   Potassium 3.7 3.5 - 5.1 mmol/L   Chloride 106 101 - 111 mmol/L   CO2 25 22 - 32 mmol/L   Glucose, Bld 254 (H) 65 - 99 mg/dL   BUN 10 6 - 20 mg/dL    Creatinine, Ser 0.94 0.44 - 1.00 mg/dL   Calcium 8.5 (L) 8.9 - 10.3 mg/dL   Total Protein 6.9 6.5 - 8.1 g/dL   Albumin 3.4 (L) 3.5 - 5.0 g/dL   AST 17 15 - 41 U/L   ALT 15 14 - 54 U/L   Alkaline Phosphatase 64 38 - 126 U/L   Total Bilirubin 0.6 0.3 - 1.2 mg/dL   GFR calc non Af Amer >60 >60 mL/min   GFR calc Af Amer >60 >60 mL/min   Anion gap 8 5 - 15  Protime-INR     Status: Abnormal   Collection Time: 07/18/14  7:10 PM  Result Value Ref Range   Prothrombin Time 21.0 (H) 11.6 - 15.2 seconds   INR 1.81 (H) 0.00 - 1.49  Glucose, capillary     Status: Abnormal   Collection Time: 07/18/14  9:37 PM  Result Value Ref Range   Glucose-Capillary 232 (H) 65 - 99 mg/dL   Comment 1 Notify RN    Comment 2 Document in Chart   Basic metabolic panel     Status: Abnormal   Collection Time: 07/19/14  1:01 AM  Result Value Ref Range   Sodium 137 135 - 145 mmol/L   Potassium 3.7 3.5 - 5.1 mmol/L   Chloride 104 101 - 111 mmol/L   CO2 26 22 - 32 mmol/L   Glucose, Bld 143 (H) 65 - 99 mg/dL   BUN 11 6 - 20 mg/dL   Creatinine, Ser 0.79 0.44 - 1.00 mg/dL   Calcium  8.4 (L) 8.9 - 10.3 mg/dL   GFR calc non Af Amer >60 >60 mL/min   GFR calc Af Amer >60 >60 mL/min   Anion gap 7 5 - 15  CBC     Status: Abnormal   Collection Time: 07/19/14  1:01 AM  Result Value Ref Range   WBC 7.6 4.0 - 10.5 K/uL   RBC 4.10 3.87 - 5.11 MIL/uL   Hemoglobin 11.3 (L) 12.0 - 15.0 g/dL   HCT 34.7 (L) 36.0 - 46.0 %   MCV 84.6 78.0 - 100.0 fL   MCH 27.6 26.0 - 34.0 pg   MCHC 32.6 30.0 - 36.0 g/dL   RDW 14.3 11.5 - 15.5 %   Platelets 285 150 - 400 K/uL  Troponin I     Status: None   Collection Time: 07/19/14  1:01 AM  Result Value Ref Range   Troponin I <0.03 <0.031 ng/mL  Glucose, capillary     Status: Abnormal   Collection Time: 07/19/14  7:01 AM  Result Value Ref Range   Glucose-Capillary 131 (H) 65 - 99 mg/dL  Glucose, capillary     Status: Abnormal   Collection Time: 07/19/14 12:10 PM  Result Value Ref Range     Glucose-Capillary 155 (H) 65 - 99 mg/dL     This is a Careers information officer Note. The care of the patient was discussed with Dr. Gordy Levan and the assessment and plan formulated with their assistance. Please see their attached note for official documentation of the daily encounter.  Signed: Ree Kida, Med Student   07/19/2014, 2:27 PM

## 2014-07-19 NOTE — Discharge Instructions (Signed)
PLEASE REMEMBER TO BRING ALL OF YOUR MEDICATIONS TO EACH OF YOUR FOLLOW-UP OFFICE VISITS. PLEASE ATTEND ALL SCHEDULED FOLLOW-UP APPOINTMENTS.  -It is very important that you continue to take both Plavix and aspirin every day for 1 year. -Please seek medical care if you have recurrent chest pain, shortness of breath, fevers, chills, signs of infection at your catheterization site, or bleeding.  Activity: Increase activity slowly as tolerated. You may shower, but no soaking baths (or swimming) for 1 week. No driving for 2 days. No lifting over 5 lbs for 1 week. No sexual activity for 1 week.   You May Return to Work: in 1 week (if applicable)  Wound Care: You may wash cath site gently with soap and water. Keep cath site clean and dry. If you notice pain, swelling, bleeding or pus at your cath site, please call [phone].    Cardiac Cath Site Care Refer to this sheet in the next few weeks. These instructions provide you with information on caring for yourself after your procedure. Your caregiver may also give you more specific instructions. Your treatment has been planned according to current medical practices, but problems sometimes occur. Call your caregiver if you have any problems or questions after your procedure. HOME CARE INSTRUCTIONS  You may shower 24 hours after the procedure. Remove the bandage (dressing) and gently wash the site with plain soap and water. Gently pat the site dry.   Do not apply powder or lotion to the site.   Do not sit in a bathtub, swimming pool, or whirlpool for 5 to 7 days.   No bending, squatting, or lifting anything over 10 pounds (4.5 kg) as directed by your caregiver.   Inspect the site at least twice daily.   Do not drive home if you are discharged the same day of the procedure. Have someone else drive you.   You may drive 24 hours after the procedure unless otherwise instructed by your caregiver.  What to expect:  Any bruising will usually fade  within 1 to 2 weeks.   Blood that collects in the tissue (hematoma) may be painful to the touch. It should usually decrease in size and tenderness within 1 to 2 weeks.  SEEK IMMEDIATE MEDICAL CARE IF:  You have unusual pain at the site or down the affected limb.   You have redness, warmth, swelling, or pain at the site.   You have drainage (other than a small amount of blood on the dressing).   You have chills.   You have a fever or persistent symptoms for more than 72 hours.   You have a fever and your symptoms suddenly get worse.   Your leg becomes pale, cool, tingly, or numb.   You have heavy bleeding from the site. Hold pressure on the site.  Document Released: 02/23/2010 Document Revised: 01/10/2011 Document Reviewed: 02/23/2010 [facility] Patient Information 2012 [place].

## 2014-07-19 NOTE — Discharge Summary (Signed)
CARDIOLOGY DISCHARGE SUMMARY   [name] MRN: [id number] DOB/AGE: [birth year] 69 y.o.  Admit date: 07/18/2014 Discharge date: 07/19/2014  PCP: Lucious Groves, DO Primary Cardiologist: Dr Burt Knack  Primary Discharge Diagnosis:  Unstable angina, s/p DES to the [place] Secondary Discharge Diagnosis:    Hypothyroidism   Hyperlipidemia   Essential hypertension   Coronary atherosclerosis   Diabetes mellitus type 2, controlled, without complications   Chest pain   S/P coronary artery stent placement, OM 07/18/14   Procedures: Cardiac catheterization, coronary arteriogram, left ventriculogram, DES to the [facility] Course: [name] is a 69 y.o. female with a history of hypertension, PA-C with bilateral renal artery stenting, CAD with remote stenting to the OM1, chronically occluded RCA, hypertension and hyperlipidemia and diabetes. She was admitted with chest pain on 07/18/2014.  Cardiac enzymes were negative for MI. She was taken to the cath lab later on 07/18/2014 as her symptoms were concerning for unstable anginal pain.  Catheterization results are below. She has a known occluded RCA that is managed medically. In her OM1, she had an 80% stenosis prior to the previously placed stent. This was treated with a drug-eluting stent, reducing the stenosis to 0.  A recent lipid profile and LFTs were reviewed. Her lipid profile showed a low HDL and elevated LDL. As we now have documented progression of her CAD, we will encourage her to follow-up with primary care as she may need an increased statin dose.  On 07/19/2014, she was seen by cardiac rehabilitation and educated on stent restrictions, heart-healthy lifestyle modifications and exercise guidelines. She was seen by Dr. Claiborne Billings and all data were reviewed. Her post procedure labs were stable and she did well with cardiac rehabilitation. No further inpatient workup was indicated and she is considered stable for discharge, to  follow up as an outpatient.  Labs:   Lab Results  Component Value Date   WBC 7.6 07/19/2014   HGB 11.3* 07/19/2014   HCT 34.7* 07/19/2014   MCV 84.6 07/19/2014   PLT 285 07/19/2014     Recent Labs Lab 07/18/14 1910 07/19/14 0101  NA 139 137  K 3.7 3.7  CL 106 104  CO2 25 26  BUN 10 11  CREATININE 0.94 0.79  CALCIUM 8.5* 8.4*  PROT 6.9  --   BILITOT 0.6  --   ALKPHOS 64  --   ALT 15  --   AST 17  --   GLUCOSE 254* 143*    Recent Labs  07/18/14 0905 07/18/14 1910 07/19/14 0101  TROPONINI 0.03 <0.03 <0.03    Recent Labs  07/18/14 1910  INR 1.81*   Lab Results  Component Value Date   CHOL 183 07/07/2014   HDL 32* 07/07/2014   LDLCALC 128* 07/07/2014   LDLDIRECT 196.8 07/11/2010   TRIG 113 07/07/2014   CHOLHDL 5.7 07/07/2014      Radiology: Dg Chest 2 View  07/18/2014   CLINICAL DATA:  Acute onset of generalized chest pain and right neck pain. Initial encounter.  EXAM: CHEST  2 VIEW  COMPARISON:  Chest radiograph performed 05/21/2013  FINDINGS: The lungs are well-aerated and clear. There is no evidence of focal opacification, pleural effusion or pneumothorax.  The heart is borderline normal in size. No acute osseous abnormalities are seen.  IMPRESSION: No acute cardiopulmonary process seen.   Electronically Signed   By: Garald Balding M.D.   On: 07/18/2014 02:40    Cardiac Cath: 07/18/2014 Conclusion  Ost RCA to Prox RCA lesion, 100% stenosed.  1st Mrg lesion, 30% stenosed. The lesion was previously treated with a stent (unknown type) .  The left ventricular systolic function is normal.  Ost 1st Mrg to 1st Mrg lesion, 80% stenosed proximal to previously placed stent. There is a 0% residual stenosis post SYNERGY DES 2.5X12  intervention.  EF 55-60 percent, no wall motion abnormalities     EKG:  07/19/2014 Sinus rhythm, rate 65 diffuse lateral ST and T-wave abnormalities, unchanged from March 2016 ECG  FOLLOW UP PLANS AND APPOINTMENTS No  Known Allergies   Medication List    TAKE these medications        amLODipine 10 MG tablet  Commonly known as:  NORVASC  Take 1 tablet (10 mg total) by mouth daily.     aspirin 81 MG tablet  Take 1 tablet (81 mg total) by mouth daily.     carvedilol 25 MG tablet  Commonly known as:  COREG  TAKE ONE-HALF TABLET BY MOUTH TWICE DAILY     clopidogrel 75 MG tablet  Commonly known as:  PLAVIX  Take 1 tablet (75 mg total) by mouth daily with breakfast.     docusate sodium 100 MG capsule  Commonly known as:  COLACE  Take 1 capsule (100 mg total) by mouth every 12 (twelve) hours.     levothyroxine 125 MCG tablet  Commonly known as:  SYNTHROID, LEVOTHROID  TAKE ONE TABLET BY MOUTH ONCE DAILY BEFORE BREAKFAST     lisinopril-hydrochlorothiazide 20-12.5 MG per tablet  Commonly known as:  PRINZIDE,ZESTORETIC  Take 1 tablet by mouth 2 (two) times daily.     metFORMIN 500 MG 24 hr tablet  Commonly known as:  GLUCOPHAGE XR  Take 1 tablet (500 mg total) by mouth daily with breakfast.     nitroGLYCERIN 0.4 MG SL tablet  Commonly known as:  NITROSTAT  Place 0.4 mg under the tongue every 5 (five) minutes as needed for chest pain. Up to 3 doses     polyethylene glycol powder powder  Commonly known as:  MIRALAX  TAKE 6 CAPFULS OF MIRALAX IN A 32 OUNCE GATORADE AND DRINK THE WHOLE BEVERAGE FOLLOWED BY 3 CAPFULS TWICE A DAY FOR THE NEXT WEEK AND FOLLOW UP WITH YOUR PRIMARY CARE PHYSICIAN.     rosuvastatin 10 MG tablet  Commonly known as:  CRESTOR  Take 1 tablet (10 mg total) by mouth at bedtime.        Discharge Instructions    Amb Referral to Cardiac Rehabilitation    Complete by:  As directed   Congestive Heart Failure: If diagnosis is Heart Failure, patient MUST meet each of the CMS criteria: 1. Left Ventricular Ejection Fraction </= 35% 2. NYHA class II-IV symptoms despite being on optimal heart failure therapy for at least 6 weeks. 3. Stable = have not had a recent (<6 weeks) or  planned (<6 months) major cardiovascular hospitalization or procedure  Program Details: - Physician supervised classes - 1-3 classes per week over a 12-18 week period, generally for a total of 36 sessions  Physician Certification: I certify that the above Cardiac Rehabilitation treatment is medically necessary and is medically approved by me for treatment of this patient. The patient is willing and cooperative, able to ambulate and medically stable to participate in exercise rehabilitation. The participant's progress and Individualized Treatment Plan will be reviewed by the Medical Director, Cardiac Rehab staff and as indicated by the Referring/Ordering Physician.  Diagnosis:  PCI  Diet - low sodium heart healthy    Complete by:  As directed      Diet Carb Modified    Complete by:  As directed      Increase activity slowly    Complete by:  As directed           Follow-up Information    Follow up with Sherren Mocha, MD.   Specialty:  Cardiology   Why:  The office will call.   Contact information:   [address] [place] [address] [phone]       Schedule an appointment as soon as possible for a visit with Lucious Groves, DO.   Specialty:  Internal Medicine   Contact information:   [place] [address] [phone]       BRING ALL MEDICATIONS WITH YOU TO FOLLOW UP APPOINTMENTS  Time spent with patient to include physician time: 43 min Signed: Rosaria Ferries, PA-C 07/19/2014, 11:58 AM Co-Sign MD

## 2014-07-19 NOTE — Progress Notes (Signed)
CARDIAC REHAB PHASE I   PRE:  Rate/Rhythm: 74 SR  BP:  Supine:   Sitting: 155/85  Standing:    SaO2:   MODE:  Ambulation: 540 ft   POST:  Rate/Rhythm: 100 SR  BP:  Supine:   Sitting: 215/83 dinamapp, 170/80 manual  Standing:    SaO2:  0850-1000 Pt walked 540 ft on RA with steady gait. No CP. Pt stated felt less SOB walking than normally. Education completed with pt who voiced understanding. Discussed CRP 2 and permission given to refer to [facility] program. Discussed importance of plavix with stent. Discussed carb counting and gave diabetic and heart healthy diets.   Graylon Good, RN BSN  07/19/2014 9:53 AM

## 2014-07-19 NOTE — Discharge Summary (Signed)
C   Name: [name] MRN: [id number] DOB: September 08, 1945 69 y.o. PCP: Lucious Groves, DO ______________________________________________________________  Date of Admission: 07/17/2014  7:11 PM Date of Discharge: 07/19/2014 Attending Physician: Oval Linsey, MD   Discharge Diagnosis: Chest pain with CAD s/p stenting 07/18/14.  AKI  Diabetes Mellitus, type II  Hypertension  Hypothyroidism  Dyslipidemia  Discharge Medications:   Medication List    TAKE these medications        docusate sodium 100 MG capsule  Commonly known as:  COLACE  Take 1 capsule (100 mg total) by mouth every 12 (twelve) hours.     polyethylene glycol powder powder  Commonly known as:  MIRALAX  TAKE 6 CAPFULS OF MIRALAX IN A 32 OUNCE GATORADE AND DRINK THE WHOLE BEVERAGE FOLLOWED BY 3 CAPFULS TWICE A DAY FOR THE NEXT WEEK AND FOLLOW UP WITH YOUR PRIMARY CARE PHYSICIAN.      ASK your doctor about these medications        amLODipine 10 MG tablet  Commonly known as:  NORVASC  Take 1 tablet (10 mg total) by mouth daily.     carvedilol 25 MG tablet  Commonly known as:  COREG  TAKE ONE-HALF TABLET BY MOUTH TWICE DAILY     levothyroxine 125 MCG tablet  Commonly known as:  SYNTHROID, LEVOTHROID  TAKE ONE TABLET BY MOUTH ONCE DAILY BEFORE BREAKFAST     lisinopril-hydrochlorothiazide 20-12.5 MG per tablet  Commonly known as:  PRINZIDE,ZESTORETIC  Take 1 tablet by mouth 2 (two) times daily.     metFORMIN 500 MG 24 hr tablet  Commonly known as:  GLUCOPHAGE XR  Take 1 tablet (500 mg total) by mouth daily with breakfast.     nitroGLYCERIN 0.4 MG SL tablet  Commonly known as:  NITROSTAT  Place 0.4 mg under the tongue every 5 (five) minutes as needed for chest pain. Up to 3 doses     rosuvastatin 10 MG tablet  Commonly known as:  CRESTOR  Take 1 tablet (10 mg total) by mouth at bedtime.        Disposition and follow-up:   [name] was discharged from [name] in good  condition to home.  Please address the following problems post-discharge:  1. Follow up with Dr. Sherren Mocha; no additional episodes of chest pain and compliance with medications; referral made to cardiac rehab per cardiology.    2. Labs / imaging needed at time of follow-up: none  3. Pending labs/ test needing follow-up: none   Follow-up Appointments: Follow-up Information    Schedule an appointment as soon as possible for a visit with [name]    .   Why:  As needed   Contact information:   [address] [place] [id number] [phone]      Discharge Instructions:   Consultations:  Cardiology   Procedures Performed:  Dg Chest 2 View  07/18/2014   CLINICAL DATA:  Acute onset of generalized chest pain and right neck pain. Initial encounter.  EXAM: CHEST  2 VIEW  COMPARISON:  Chest radiograph performed 05/21/2013  FINDINGS: The lungs are well-aerated and clear. There is no evidence of focal opacification, pleural effusion or pneumothorax.  The heart is borderline normal in size. No acute osseous abnormalities are seen.  IMPRESSION: No acute cardiopulmonary process seen.   Electronically Signed   By: Garald Balding M.D.   On: 07/18/2014 02:40    2D Echo: N/A  Cardiac Cath:  07/18/2014 Ms [name] had 80% proximal OM1 stenosis proximal to the previous placed stent. LAD had minor irregularities in the RCA was known to be chronically occluded with normal LV function. The patient received 600 mg of Plavix. She received weight based bivalirudin. She underwent PCI stenting of her proximal OM1 with a Synergy 2.5 mm x 12 mm long stent at 14 atm (2.70 mm) after predilatation with a 2 mm balloon. This was done radially. She received radial cocktail to SideArm sheath. She remained hemodynamically stable throughout the case. The sheath was removed and a TR band was placed on the right wrist to acute patent hemostasis. The patient left the  laboratory stable condition. She'll be discharged home tomorrow on dual antiplatelets therapy  Admission HPI:  Pt is a 69 y/o F w/ PMHx of HTN, HLD, CAD, hypothyroidism, and hx of MI who presents with chest pain. Chest pain is located started around 10:00pm. She was doing laundry and felt nauseous. She then sat down and had 7/10 chest pain that started at her midsternum radiated to her back. She then laid down and had to sit back up due to SOB and heavy pressure sensation on chest. She was also diaphoretic and felt like her heart was fluttering. She denies weakness or tingling in her hands, she did have rt sided neck pain. She took 2 baby asa and 1 ng without much improvement in pain. She then took another NG and chest pain started subsiding around 61mins while she was en route to the ED. She currently denies any chest pain. She experienced this symptoms before without the chest pain ( SOB and diaphoresis) and was admitted w/ resultant PCI w/ stenting in left circumflex in 1998. She follows w/ Dr. Burt Knack w/ cardiology. Pt was admitted for chest pain rule out. Point of care troponin negative.   Original H&P: Teresa Oka, MD   Hospital Course by problem list:  #Chest pain with CAD s/p stenting 07/18/14 The patient presented with anginal type chest pain, which resolved with NG. She has a h/o ACS s/p stenting of LCX in 1998, when she presented with similar symptoms. Last cath in 2004 revealed patent LCX stent, 90% RCA stenosis, and 50% stenosis of the LAD. She is being followed by Dr. Burt Knack with cardiology. Troponins were trended, and remained negative. CXR was unremarkable. EKG on admission revealed T wave changes. Her symptoms were consistent with ACS, so she was taken to the cath lab later on 07/18/2014. Cardiac cath on 6/13 showed 100% RCA occlusion, consistent with prior cath results, and 80% stenosis proximal to her previously placed stent, which was treated with a drug eluting stent with complete  resolution of stenosis. She tolerated the procedure well. She was started on Plavix $RemoveB'75mg'OxxanUhU$  and should be continued on it for 12 months. Given her elevated LDL on the most recent lipid profile, she should follow up with her PCP or cardiology regarding potentially increasing her statin dose. Prior to discharge, she was given instructions on appropriate lifestyle modification.She was given a referral to a cardiac rehab program. She should follow up with her cardiologist as an outpatient.   #AKI Resolved s/p IV hydration. SCr on admission 1.15, up from baseline at 0.8. Possibly due to home diuretic use vs reduced PO intake. We held her home lisinopril, HCTZ and restarted on discharge.   #DMII We held her metformin 500 mg qd in setting of AKI and restarted it on discharge.   #HTN She has a h/o renal artery stenosis  with right renal artery stenting in 2007. We held her home lisinopril and HCTZ in the setting of AKI which we restarted on discharge. We continued her on her home carvedilol, amlodipine.  #Hypothyroidism Stable. We continued her on her home synthroid 125 mcg.  #HLD Most recent lipid panel with cholesterol 183, HDL 32, LDL 128. We continued her home Crestor 10 mg. The patient should f/u with PCP re increasing statin dose since LDL is above goal.   Discharge Vitals:   BP 144/71 mmHg  Pulse 83  Temp(Src) 98.9 F (37.2 C) (Oral)  Resp 16  Ht _0  (1.651 m)  Wt 96.571 kg (212 lb 14.4 oz)  BMI 35.43 kg/m2  SpO2 97%  Discharge Labs:  No results found for this or any previous visit (from the past 24 hour(s)).  Signed: Jones Bales, MD 07/23/2014, 2:44 PM   Services Ordered on Discharge: None Equipment Ordered on Discharge: None

## 2014-07-19 NOTE — Progress Notes (Signed)
[name] was seen on rounds this AM and was doing well post cath with stent placement.  All of her questions were answered and the reasons and importance of her medications were discussed and renforced.  She was felt to be stable for discharge.

## 2014-07-19 NOTE — Progress Notes (Signed)
Pt profile: 69yo female with a history of ASCAD s/p PCI of LCx and chronically occluded small RCA and last cath 2004 with patent LCx stent, renal artery stenosis s/p right renal artery stenting in 2007 with last renal arterial duplex in 2011 with patent stent, HTN, type 2 DM, dyslipidemia and GERD who presented to the ER with complaints of chest pain which started last night around 10pm. She had been seen in the ER earlier that day for some constipation and was sent home. She was doing laundry at the time and started to feel nauseated. She sat down and then laid down. and developed SOB and severe heavy pressure in her chest with radiation to her back and right neck and had to sit up. She broke out in a sweat. She also felt like her heart was fluttering. She took 2 baby ASA and 1 SL NTG but pain persisted so she took another SL NTG and CP subsided after about 10 minutes. She was transferred to the ER via EMS. On arrival in ER she was pain free. Of note, she had similar symptoms of SOB and diaphoresis in 1998 at which time she got PCI to the left circ. She has not had any further CP and initial troponin is normal.  Pt with cath and stent to ostial OM.  Subjective: No chest pain, no SOB  Objective: Vital signs in last 24 hours: Temp:  [97.6 F (36.4 C)-98.6 F (37 C)] 98.6 F (37 C) (06/14 0801) Pulse Rate:  [0-97] 71 (06/14 0801) Resp:  [0-30] 19 (06/14 0801) BP: (119-197)/(58-112) 159/75 mmHg (06/14 0801) SpO2:  [0 %-100 %] 99 % (06/14 0801) Weight:  [217 lb 2.5 oz (98.5 kg)] 217 lb 2.5 oz (98.5 kg) (06/14 0019) Weight change: 4 lb 4 oz (1.929 kg) Last BM Date: 07/17/14 Intake/Output from previous day: 06/13 0701 - 06/14 0700 In: 340 [P.O.:240] Out: 1600 [Urine:1600] Intake/Output this shift:    PE: General:Pleasant affect, NAD Skin:Warm and dry, brisk capillary refill HEENT:normocephalic, sclera clear, mucus membranes moist Heart:S1S2 RRR without murmur, gallup, rub  or click Lungs:clear without rales, rhonchi, or wheezes ACZ:YSAY, non tender, + BS, do not palpate liver spleen or masses Ext:no lower ext edema, 2+ pedal pulses, 2+ radial pulses- rt radial cath site without hematoma Neuro:alert and oriented, MAE, follows commands, + facial symmetry Tele:  SR  EKG:  SR now with resolution of T wave inversion in lat leads   Lab Results:  Recent Labs  07/18/14 0209 07/19/14 0101  WBC 10.7* 7.6  HGB 11.9* 11.3*  HCT 36.2 34.7*  PLT 304 285   BMET  Recent Labs  07/18/14 1910 07/19/14 0101  NA 139 137  K 3.7 3.7  CL 106 104  CO2 25 26  GLUCOSE 254* 143*  BUN 10 11  CREATININE 0.94 0.79  CALCIUM 8.5* 8.4*    Recent Labs  07/18/14 1910 07/19/14 0101  TROPONINI <0.03 <0.03    Lab Results  Component Value Date   CHOL 183 07/07/2014   HDL 32* 07/07/2014   LDLCALC 128* 07/07/2014   LDLDIRECT 196.8 07/11/2010   TRIG 113 07/07/2014   CHOLHDL 5.7 07/07/2014   Lab Results  Component Value Date   HGBA1C 6.9 07/07/2014     Lab Results  Component Value Date   TSH 3.682 07/07/2014    Hepatic Function Panel  Recent Labs  07/18/14 1910  PROT 6.9  ALBUMIN 3.4*  AST 17  ALT 15  ALKPHOS 64  BILITOT 0.6   No results for input(s): CHOL in the last 72 hours. No results for input(s): PROTIME in the last 72 hours.     Studies/Results: Dg Chest 2 View  07/18/2014   CLINICAL DATA:  Acute onset of generalized chest pain and right neck pain. Initial encounter.  EXAM: CHEST  2 VIEW  COMPARISON:  Chest radiograph performed 05/21/2013  FINDINGS: The lungs are well-aerated and clear. There is no evidence of focal opacification, pleural effusion or pneumothorax.  The heart is borderline normal in size. No acute osseous abnormalities are seen.  IMPRESSION: No acute cardiopulmonary process seen.   Electronically Signed   By: Garald Balding M.D.   On: 07/18/2014 02:40    Medications: I have reviewed the patient's current  medications. Scheduled Meds: . amLODipine  10 mg Oral Daily  . aspirin  81 mg Oral Daily  . atorvastatin  80 mg Oral q1800  . carvedilol  12.5 mg Oral BID  . clopidogrel  75 mg Oral Q breakfast  . docusate sodium  100 mg Oral Q12H  . enoxaparin (LOVENOX) injection  40 mg Subcutaneous Q24H  . insulin aspart  0-5 Units Subcutaneous QHS  . insulin aspart  0-9 Units Subcutaneous TID WC  . insulin aspart  0-9 Units Subcutaneous TID WC  . levothyroxine  125 mcg Oral QAC breakfast  . polyethylene glycol  17 g Oral Daily  . sodium chloride  3 mL Intravenous Q12H   Continuous Infusions:  PRN Meds:.sodium chloride, acetaminophen, morphine injection, ondansetron (ZOFRAN) IV, sodium chloride  Assessment/Plan: Principal Problem:   Chest pain- unstable angina   S/p stent to OM with DES- plan to ambulate and d/c home  Active Problems:   Hypothyroidism   Hyperlipidemia- on lipitor 80 mg, LDL 128, HDL 32 previously had been on crestor 10 mg- pt prefers lipitor due to cost.    Essential hypertension- BP 139/63 to 119/83 to 159/75    Coronary atherosclerosis, previous s/p PCI of LCx and chronically occluded small RCA    Diabetes mellitus type 2, controlled, without complications- glucose controlled.   Unstable angina    LOS: 1 day    [facility]  Nurse Practitioner Certified Pager [phone] or after 5pm and on weekends call [phone] 07/19/2014, 8:19 AM   Patient seen and examined. Agree with assessment and plan. No recurrent chest pain s/p PCI/Stent to OM1 with prviously documented RCA occlusion. Medical therapy for concomitant CAD. DC today. F/U Dr. Burt Knack.   Troy Sine, MD, [facility] 07/19/2014 10:42 AM

## 2014-07-19 NOTE — Progress Notes (Signed)
Subjective: Patient feels well this morning. She is s/p cardiac cath with stent placement yesterday. Denies CP, SOB, fevers or chills.   Objective: Vital signs in last 24 hours: Filed Vitals:   07/19/14 0705 07/19/14 0800 07/19/14 0801 07/19/14 1212  BP: 119/83  159/75 154/69  Pulse: 68 74 71 67  Temp: 97.6 F (36.4 C)  98.6 F (37 C) 98.7 F (37.1 C)  TempSrc: Oral  Oral Oral  Resp: 16 25 19 19   Height:      Weight:      SpO2: 98% 99% 99% 98%   Weight change: 1.929 kg (4 lb 4 oz)  Intake/Output Summary (Last 24 hours) at 07/19/14 1347 Last data filed at 07/19/14 1329  Gross per 24 hour  Intake    480 ml  Output   1600 ml  Net  -1120 ml    General: lying in bed, in no acute distress  CV: RRR, normal S1, S2, no murmurs, rubs or gallops Pulm: CTAB, no wheezing, no crackles, normal respiratory effort  Abdomen: obese, +BS, non-tender, non-distended Extremities: no peripheral edema, warm, well perfused, radial catheterization site clean, dry, intact, bandaid in place Skin: no rashes  Lab Results: Basic Metabolic Panel:  Recent Labs  07/18/14 0905 07/18/14 1910 07/19/14 0101  NA  --  139 137  K  --  3.7 3.7  CL  --  106 104  CO2  --  25 26  GLUCOSE  --  254* 143*  BUN  --  10 11  CREATININE  --  0.94 0.79  CALCIUM  --  8.5* 8.4*  MG 2.0  --   --    CBC:  Recent Labs  07/18/14 0209 07/19/14 0101  WBC 10.7* 7.6  NEUTROABS 8.3*  --   HGB 11.9* 11.3*  HCT 36.2 34.7*  MCV 84.2 84.6  PLT 304 285   Cardiac Enzymes:  Recent Labs  07/18/14 0905 07/18/14 1910 07/19/14 0101  TROPONINI 0.03 <0.03 <0.03    CBG:  Recent Labs  07/18/14 0827 07/18/14 1112 07/18/14 1701 07/18/14 2137 07/19/14 0701 07/19/14 1210  GLUCAP 116* 128* 157* 232* 131* 155*    EKG: 07/19/2014 Sinus rhythm, rate 65, diffuse lateral ST and T-wave abnormalities, unchanged from March 2016 ECG  Medications: I have reviewed the patient's current medications. Scheduled  Meds: . amLODipine  10 mg Oral Daily  . aspirin  81 mg Oral Daily  . atorvastatin  80 mg Oral q1800  . carvedilol  12.5 mg Oral BID  . clopidogrel  75 mg Oral Q breakfast  . docusate sodium  100 mg Oral Q12H  . enoxaparin (LOVENOX) injection  40 mg Subcutaneous Q24H  . insulin aspart  0-5 Units Subcutaneous QHS  . insulin aspart  0-9 Units Subcutaneous TID WC  . insulin aspart  0-9 Units Subcutaneous TID WC  . levothyroxine  125 mcg Oral QAC breakfast  . polyethylene glycol  17 g Oral Daily  . sodium chloride  3 mL Intravenous Q12H   Continuous Infusions:  PRN Meds:.sodium chloride, acetaminophen, morphine injection, ondansetron (ZOFRAN) IV, sodium chloride Assessment/Plan: Principal Problem:   Unstable angina Active Problems:   Hypothyroidism   Hyperlipidemia   Essential hypertension   Coronary atherosclerosis   Diabetes mellitus type 2, controlled, without complications   Chest pain   S/P coronary artery stent placement, OM 07/18/14   This is a 69 yo female with a PMH of CAD, ACS s/p LCX stenting (1998), HLD, HTN, T2DM, renal artery stenosis  s/p stenting in 2007 who presents with chest pain. The pain responded to the NG that she took at home. Her CP had resolved by the time she was seen in the ER. Initial troponin was normal. CXR was negative for any acute cardiopulmonary process. Her admission EKG revealed T wave changes. A repeat EKG yesterday was significant first degree AV block and T wave inversion in V4-V6, I, aVL, which were more pronounced compared to previous. Cardiac cath on 6/13 showed 100% RCA occlusion, consistent with prior cath results, and 80% stenosis proximal to her previously placed stent, which was treated with a drug eluting stent.  #Chest Pain patient with multiple CAD risk factors presents with anginal type chest pain, which resolved with NG. She has a h/o ACS s/p stenting of LCX in 1998, when she presented with similar symptoms. Last cath in 2004 revealed  patent LCX stent, 90% RCA stenosis, and 50% stenosis of the LAD. She is being followed by Dr. Burt Knack with cardiology. Initial troponin was negative. CXR was unremarkable. EKG on admission revealed T wave changes. Repeat EKG today with first degree AV block, T wave inversions in leads V4-V6, I, and aVL, more pronounced than on previous EKGs. Given her h/o ACS, multiple risk factors, and previous cath showing significant CAD, differential diagnosis includes NSTEMI vs unstable angina. However, NSTEMI is less likely due to negative troponins. - cardiology consulted, appreciate recs - s/p coronary artery stent placement yesterday, patient tolerated the procedure well - telemetry - troponins negative    - cnt ASA 81mg  - started on  plavix 75mg  qd - patient will be discharged today  #AKI Resolved s/p IV hydration. SCr on admission 1.15, up from baseline at 0.8. Possibly due to home diuretic use vs reduced PO intake.  - holding home lisinopril, HCTZ   #DM - holding metformin 500 mg qd - SSI  #HTN Stable. H/o renal artery stenosis with right renal artery stenting in 2007.  - holding home lisinopril, HCTZ  - cnt home carvedilol, amlodipine  #Hypothyroidism Stable -cnt home synthroid 125 mcg  #HLD Most recent lipid panel with cholesterol 183, HDL 32, LDL 128.  - cnt home Crestor 10 mg   #DVT ppx - lovenox 40   #FEN/GI - heart health /carb modified diet - replete electrolytes prn - Miralax   #Dispo: discharge to home today.   This is a Careers information officer Note.  The care of the patient was discussed with Dr. Gordy Levan and the assessment and plan formulated with their assistance.  Please see their attached note for official documentation of the daily encounter.   LOS: 1 day   [employer], Med Student 07/19/2014, 1:47 PM

## 2014-07-19 NOTE — Progress Notes (Signed)
Subjective:   VSS.  No overnight events.  Denies CP.  Pt underwent a LHC yesterday done radially.  The 1st obtuse marginal (OM1) was 80% stenosed proximal to the previous placed stent.  She underwent PCI stenting of the proximal OM1 with a DES.  Stable for d/c today.   Objective:   Vital signs in last 24 hours: Filed Vitals:   07/19/14 0705 07/19/14 0800 07/19/14 0801 07/19/14 1212  BP: 119/83  159/75 154/69  Pulse: 68 74 71 67  Temp: 97.6 F (36.4 C)  98.6 F (37 C) 98.7 F (37.1 C)  TempSrc: Oral  Oral Oral  Resp: 16 25 19 19   Height:      Weight:      SpO2: 98% 99% 99% 98%    Weight: Filed Weights   07/18/14 0600 07/19/14 0019  Weight: 96.571 kg (212 lb 14.4 oz) 98.5 kg (217 lb 2.5 oz)    I/Os:  Intake/Output Summary (Last 24 hours) at 07/19/14 1340 Last data filed at 07/19/14 1329  Gross per 24 hour  Intake    480 ml  Output   1600 ml  Net  -1120 ml    Physical Exam: Constitutional: Vital signs reviewed.  Patient is sitting up in bed in no acute distress and cooperative with exam.   HEENT: [place]/AT; EOMI, conjunctivae normal, no scleral icterus  Cardiovascular: RRR, no MRG Pulmonary/Chest: normal respiratory effort, no accessory muscle use, CTAB, no wheezes, rales, or rhonchi Abdominal: Obese. Soft. +BS, NT/ND Neurological: A&O x3, CN II-XII grossly intact; non-focal exam Extremities: 2+DP b/l,  no pitting edema.  R arm without hematoma, radial pulses intact bilaterally Skin: Warm, dry and intact.   Lab Results:  BMP:  Recent Labs Lab 07/18/14 0905 07/18/14 1910 07/19/14 0101  NA  --  139 137  K  --  3.7 3.7  CL  --  106 104  CO2  --  25 26  GLUCOSE  --  254* 143*  BUN  --  10 11  CREATININE  --  0.94 0.79  CALCIUM  --  8.5* 8.4*  MG 2.0  --   --     CBC:  Recent Labs Lab 07/18/14 0209 07/19/14 0101  WBC 10.7* 7.6  NEUTROABS 8.3*  --   HGB 11.9* 11.3*  HCT 36.2 34.7*  MCV 84.2 84.6  PLT 304 285    Coagulation:  Recent  Labs Lab 07/18/14 1910  LABPROT 21.0*  INR 1.81*    CBG:            Recent Labs Lab 07/18/14 0827 07/18/14 1112 07/18/14 1701 07/18/14 2137 07/19/14 0701 07/19/14 1210  GLUCAP 116* 128* 157* 232* 131* 155*           HA1C:      No results for input(s): HGBA1C in the last 168 hours.  Lipid Panel: No results for input(s): CHOL, HDL, LDLCALC, TRIG, CHOLHDL, LDLDIRECT in the last 168 hours.  LFTs:  Recent Labs Lab 07/18/14 1910  AST 17  ALT 15  ALKPHOS 64  BILITOT 0.6  PROT 6.9  ALBUMIN 3.4*    Pancreatic Enzymes: No results for input(s): LIPASE, AMYLASE in the last 168 hours.  Lactic Acid/Procalcitonin: No results for input(s): LATICACIDVEN, PROCALCITON in the last 168 hours.  Ammonia: No results for input(s): AMMONIA in the last 168 hours.  Cardiac Enzymes:  Recent Labs Lab 07/18/14 0905 07/18/14 1910 07/19/14 0101  TROPONINI 0.03 <0.03 <0.03    EKG: EKG Interpretation  Date/Time:  Monday July 18 2014 00:22:41 EDT Ventricular Rate:  93 PR Interval:  144 QRS Duration: 92 QT Interval:  388 QTC Calculation: 482 R Axis:   -11 Text Interpretation:  Normal sinus rhythm Left ventricular hypertrophy with repolarization abnormality Abnormal ECG No significant change since last tracing Confirmed by Glynn Octave [phone]) on 07/18/2014 2:54:43 AM   BNP: No results for input(s): PROBNP in the last 168 hours.  D-Dimer: No results for input(s): DDIMER in the last 168 hours.  Urinalysis: No results for input(s): COLORURINE, LABSPEC, PHURINE, GLUCOSEU, HGBUR, BILIRUBINUR, KETONESUR, PROTEINUR, UROBILINOGEN, NITRITE, LEUKOCYTESUR in the last 168 hours.  Invalid input(s): APPERANCEUR  Micro Results: No results found for this or any previous visit (from the past 240 hour(s)).  Blood Culture: No results found for: SDES, SPECREQUEST, CULT, REPTSTATUS  Studies/Results: Dg Chest 2 View  07/18/2014   CLINICAL DATA:  Acute onset of generalized chest  pain and right neck pain. Initial encounter.  EXAM: CHEST  2 VIEW  COMPARISON:  Chest radiograph performed 05/21/2013  FINDINGS: The lungs are well-aerated and clear. There is no evidence of focal opacification, pleural effusion or pneumothorax.  The heart is borderline normal in size. No acute osseous abnormalities are seen.  IMPRESSION: No acute cardiopulmonary process seen.   Electronically Signed   By: Garald Balding M.D.   On: 07/18/2014 02:40    Medications:  Scheduled Meds: . amLODipine  10 mg Oral Daily  . aspirin  81 mg Oral Daily  . atorvastatin  80 mg Oral q1800  . carvedilol  12.5 mg Oral BID  . clopidogrel  75 mg Oral Q breakfast  . docusate sodium  100 mg Oral Q12H  . enoxaparin (LOVENOX) injection  40 mg Subcutaneous Q24H  . insulin aspart  0-5 Units Subcutaneous QHS  . insulin aspart  0-9 Units Subcutaneous TID WC  . insulin aspart  0-9 Units Subcutaneous TID WC  . levothyroxine  125 mcg Oral QAC breakfast  . polyethylene glycol  17 g Oral Daily  . sodium chloride  3 mL Intravenous Q12H   Continuous Infusions:   PRN Meds: sodium chloride, acetaminophen, morphine injection, ondansetron (ZOFRAN) IV, sodium chloride  Antibiotics: Antibiotics Given (last 72 hours)    None      Day of Hospitalization:   Consults: Treatment Team:  Rounding [facility]cardiology, MD  Assessment/Plan:   Principal Problem:   Unstable angina Active Problems:   Hypothyroidism   Hyperlipidemia   Essential hypertension   Coronary atherosclerosis   Diabetes mellitus type 2, controlled, without complications   Chest pain   S/P coronary artery stent placement, OM 07/18/14  Chest pain with CAD s/p stenting 07/18/14 Pt underwent a R radial approach LHC yesterday and OM1 was 80% stenosed which was stented with a DES.  SCr remains normal this AM.  No hematoma on R arm with radial pulses intact bilaterally.   -placed on dual antiplatelet therapy for DES -follow-up with cards and IMC   Controlled  DMII Last HA1c 6.9. Lab Results  Component Value Date   HGBA1C 6.9 07/07/2014  -SSI -cbgs  HTN -cont home meds, except hold lisinopril in setting of cath   Dyslipidemia Crestor is home med for dyslipidemia. -d/c crestor -add atorvastatin 80mg    Hypothyroidism Last TSH 3.682.  Lab Results  Component Value Date   TSH 3.682 07/07/2014  -cont current dose   F/E/N Fluids- NS per cards  Electrolytes- Replete as needed  Nutrition- HH/Carb mod diet   VTE PPx  lovenox 40mg  qd  Disposition Discharge today with cardiology and [name] follow-up.     LOS: 1 day   Jones Bales, MD PGY-2, Internal Medicine Teaching Service 07/19/2014, 1:40 PM

## 2014-07-26 ENCOUNTER — Encounter: Payer: Self-pay | Admitting: Internal Medicine

## 2014-07-26 ENCOUNTER — Ambulatory Visit (INDEPENDENT_AMBULATORY_CARE_PROVIDER_SITE_OTHER): Payer: Medicare Other | Admitting: Internal Medicine

## 2014-07-26 VITALS — BP 131/62 | HR 67 | Temp 98.7°F | Ht 64.5 in | Wt 214.3 lb

## 2014-07-26 DIAGNOSIS — I200 Unstable angina: Secondary | ICD-10-CM | POA: Diagnosis not present

## 2014-07-26 DIAGNOSIS — I10 Essential (primary) hypertension: Secondary | ICD-10-CM | POA: Diagnosis not present

## 2014-07-26 DIAGNOSIS — I25110 Atherosclerotic heart disease of native coronary artery with unstable angina pectoris: Secondary | ICD-10-CM | POA: Diagnosis not present

## 2014-07-26 DIAGNOSIS — E119 Type 2 diabetes mellitus without complications: Secondary | ICD-10-CM | POA: Diagnosis not present

## 2014-07-26 LAB — BASIC METABOLIC PANEL WITH GFR
BUN: 13 mg/dL (ref 6–23)
CO2: 30 mEq/L (ref 19–32)
Calcium: 9.3 mg/dL (ref 8.4–10.5)
Chloride: 101 mEq/L (ref 96–112)
Creat: 0.88 mg/dL (ref 0.50–1.10)
GFR, Est African American: 79 mL/min
GFR, Est Non African American: 69 mL/min
Glucose, Bld: 119 mg/dL — ABNORMAL HIGH (ref 70–99)
Potassium: 3.8 mEq/L (ref 3.5–5.3)
Sodium: 142 mEq/L (ref 135–145)

## 2014-07-26 LAB — GLUCOSE, CAPILLARY: Glucose-Capillary: 128 mg/dL — ABNORMAL HIGH (ref 65–99)

## 2014-07-26 MED ORDER — ATORVASTATIN CALCIUM 80 MG PO TABS: 80.0000 mg | ORAL_TABLET | Freq: Every day | ORAL | Status: DC

## 2014-07-26 NOTE — Progress Notes (Signed)
[facility] Subjective:   [name] female   DOB: 1945-07-23 69 y.o.   MRN: [id number]  HPI: [name] is a 69 y.o. female with a PMH detailed below who presents for HFU.  She was admitted for chest pain and found to have UA. She was taking to the cath lab and found to have an occluded OM which was treated with a DES.  She reports she is doing well since discharge, no further chest pain.  No SOB. Doing well with medications but has not resumed metformin yet.    Past Medical History  Diagnosis Date  . Cervical polyp   . Neck mass   . Cervical lymphadenopathy   . Urinary tract infection   . Hyperlipidemia     takes Crestor daily  . Coronary artery disease     Prior stent of the lCX with chronic total occlusion of a small RCA. Last cath in 2004 showing patent stent, nonobstructive disease and total RCA. EF is 65 to 70%. She has been managed medically.   . Tubular adenoma of rectum 10/24/04  . Diabetes mellitus without complication     takes Metformin daily  . Hypothyroidism     takes SYnthroid daily  . Hypertension     takes Amlodipine,Coreg,and Prinzide daily  . TIA (transient ischemic attack)   . Back pain     reason unknown  . History of colon polyps   . Depression     sees a Social worker at [facility] but no meds  . Umbilical hernia   . OSA (obstructive sleep apnea)     PT DENIES, STATES SHE WAS TOLD SHE DIDN'T HAVE OSA, NO CPAP  . PTSD (post-traumatic stress disorder)   . Renal artery stenosis 2007  . Stroke     TIA- LAST ONE GREATER THAN 10 YEARS PER PT  . CAD (coronary artery disease)    Current Outpatient Prescriptions  Medication Sig Dispense Refill  . amLODipine (NORVASC) 10 MG tablet Take 1 tablet (10 mg total) by mouth daily. 90 tablet 0  . aspirin 81 MG tablet Take 1 tablet (81 mg total) by mouth daily.    Marland Kitchen atorvastatin (LIPITOR) 80 MG tablet Take 1 tablet (80 mg total) by mouth daily. 30 tablet 11  . carvedilol  (COREG) 25 MG tablet TAKE ONE-HALF TABLET BY MOUTH TWICE DAILY 30 tablet 10  . clopidogrel (PLAVIX) 75 MG tablet Take 1 tablet (75 mg total) by mouth daily with breakfast. 30 tablet 11  . docusate sodium (COLACE) 100 MG capsule Take 1 capsule (100 mg total) by mouth every 12 (twelve) hours. 30 capsule 0  . levothyroxine (SYNTHROID, LEVOTHROID) 125 MCG tablet TAKE ONE TABLET BY MOUTH ONCE DAILY BEFORE BREAKFAST 30 tablet 5  . lisinopril-hydrochlorothiazide (PRINZIDE,ZESTORETIC) 20-12.5 MG per tablet Take 1 tablet by mouth 2 (two) times daily. 60 tablet 11  . metFORMIN (GLUCOPHAGE XR) 500 MG 24 hr tablet Take 1 tablet (500 mg total) by mouth daily with breakfast. 30 tablet 11  . nitroGLYCERIN (NITROSTAT) 0.4 MG SL tablet Place 0.4 mg under the tongue every 5 (five) minutes as needed for chest pain. Up to 3 doses    . polyethylene glycol powder (MIRALAX) powder TAKE 6 CAPFULS OF MIRALAX IN A 32 OUNCE GATORADE AND DRINK THE WHOLE BEVERAGE FOLLOWED BY 3 CAPFULS TWICE A DAY FOR THE NEXT WEEK AND FOLLOW UP WITH YOUR PRIMARY CARE PHYSICIAN. 255 g 0   No current facility-administered medications for this  visit.   Family History  Problem Relation Age of Onset  . Hypertension Mother   . Osteoarthritis Mother   . Alzheimer's disease Father   . Cancer Neg Hx   . Stroke Neg Hx   . Colon cancer Neg Hx   . Esophageal cancer Neg Hx   . Stomach cancer Neg Hx   . Rectal cancer Neg Hx    History   Social History  . Marital Status: Single    Spouse Name: N/A  . Number of Children: N/A  . Years of Education: N/A   Social History Main Topics  . Smoking status: Former Research scientist (life sciences)  . Smokeless tobacco: Never Used     Comment: quit smoking about 30 yrs ago  . Alcohol Use: 0.0 oz/week    0 Standard drinks or equivalent per week     Comment: Wine on special occasions  . Drug Use: No  . Sexual Activity: Not on file   Other Topics Concern  . None   Social History Narrative   Review of Systems: Review of  Systems  Constitutional: Negative for fever and chills.  Eyes: Negative for blurred vision.  Respiratory: Negative for cough and shortness of breath.   Cardiovascular: Negative for chest pain, palpitations and leg swelling.  Gastrointestinal: Negative for heartburn and abdominal pain.  Genitourinary: Negative for dysuria.  Musculoskeletal: Negative for myalgias.  Skin: Negative for rash.  Neurological: Negative for headaches.     Objective:  Physical Exam: Filed Vitals:   07/26/14 1107  BP: 131/62  Pulse: 67  Temp: 98.7 F (37.1 C)  TempSrc: Oral  Height: 5' 4.5" (1.638 m)  Weight: 214 lb 4.8 oz (97.206 kg)  SpO2: 100%   Physical Exam  Constitutional: She is well-developed, well-nourished, and in no distress. No distress.  HENT:  Head: Normocephalic and atraumatic.  Eyes: Conjunctivae are normal.  Cardiovascular: Normal rate, regular rhythm, normal heart sounds and intact distal pulses.   No murmur heard. Pulmonary/Chest: Effort normal and breath sounds normal. No respiratory distress. She has no wheezes. She has no rales.  Abdominal: Soft. Bowel sounds are normal. She exhibits no distension. There is no tenderness.  Musculoskeletal: She exhibits no edema.  Skin: Skin is warm and dry. She is not diaphoretic.  Psychiatric: Affect normal.  Nursing note and vitals reviewed.   Assessment & Plan:  Case discussed with Dr. Dareen Piano  Unstable angina -Resolved, No further episodes of chest pain  Essential hypertension BP Readings from Last 3 Encounters:  07/26/14 131/62  07/19/14 154/69  07/17/14 144/71    Lab Results  Component Value Date   NA 137 07/19/2014   K 3.7 07/19/2014   CREATININE 0.79 07/19/2014    Assessment: Blood pressure control: controlled Progress toward BP goal:  at goal Comments:   Plan: Medications:  Lisinopril-HCTZ 20-12.5mg  BID, Coreg 25 BID, Amlodipine 10mg  BID Educational resources provided:   Self management tools provided:   Other  plans:    Coronary atherosclerosis - DES placed 07/17/13.  Continue ASA and Plavix. - Change Crestor 10mg  to Atrovastatin 80mg  (affordability) - No further chest pain, plans to follow up with Dr. Burt Knack  Diabetes mellitus type 2, controlled, without complications Lab Results  Component Value Date   HGBA1C 6.9 07/07/2014   HGBA1C 7.0 11/25/2013   HGBA1C 6.6 04/23/2013     Assessment: Diabetes control: good control (HgbA1C at goal) Progress toward A1C goal:  at goal Comments:   Plan: Medications: metformin 500mg  XR daily Home glucose monitoring: Frequency:  no home glucose monitoring Timing:   Instruction/counseling given: no instruction/counseling  Educational resources provided:   Self management tools provided:   Other plans:       Medications Ordered Meds ordered this encounter  Medications  . atorvastatin (LIPITOR) 80 MG tablet    Sig: Take 1 tablet (80 mg total) by mouth daily.    Dispense:  30 tablet    Refill:  11    D/C Crestor   Other Orders Orders Placed This Encounter  Procedures  . Glucose, capillary  . BMP with Estimated GFR ([id number])   Follow Up: Return in about 3 months (around 10/26/2014).

## 2014-07-26 NOTE — Assessment & Plan Note (Signed)
Lab Results  Component Value Date   HGBA1C 6.9 07/07/2014   HGBA1C 7.0 11/25/2013   HGBA1C 6.6 04/23/2013     Assessment: Diabetes control: good control (HgbA1C at goal) Progress toward A1C goal:  at goal Comments:   Plan: Medications: metformin 500mg  XR daily Home glucose monitoring: Frequency: no home glucose monitoring Timing:   Instruction/counseling given: no instruction/counseling  Educational resources provided:   Self management tools provided:   Other plans:

## 2014-07-26 NOTE — Assessment & Plan Note (Signed)
-   DES placed 07/17/13.  Continue ASA and Plavix. - Change Crestor 10mg  to Atrovastatin 80mg  (affordability) - No further chest pain, plans to follow up with Dr. Burt Knack

## 2014-07-26 NOTE — Assessment & Plan Note (Signed)
BP Readings from Last 3 Encounters:  07/26/14 131/62  07/19/14 154/69  07/17/14 144/71    Lab Results  Component Value Date   NA 137 07/19/2014   K 3.7 07/19/2014   CREATININE 0.79 07/19/2014    Assessment: Blood pressure control: controlled Progress toward BP goal:  at goal Comments:   Plan: Medications:  Lisinopril-HCTZ 20-12.5mg  BID, Coreg 25 BID, Amlodipine 10mg  BID Educational resources provided:   Self management tools provided:   Other plans:

## 2014-07-26 NOTE — Assessment & Plan Note (Signed)
-  Resolved, No further episodes of chest pain

## 2014-07-26 NOTE — Patient Instructions (Signed)
General Instructions:  You can restart Metformin.  I am changing your Rosuvastatin to Atorvastatin 80mg  This will be cheaper.   Please bring your medicines with you each time you come to clinic.  Medicines may include prescription medications, over-the-counter medications, herbal remedies, eye drops, vitamins, or other pills.   Progress Toward Treatment Goals:  Treatment Goal 07/07/2014  Hemoglobin A1C at goal  Blood pressure deteriorated    Self Care Goals & Plans:  Self Care Goal 07/07/2014  Manage my medications take my medicines as prescribed; bring my medications to every visit; refill my medications on time  Monitor my health keep track of my blood glucose; bring my glucose meter and log to each visit  Eat healthy foods eat more vegetables; eat foods that are low in salt; eat baked foods instead of fried foods  Be physically active find an activity I enjoy  Other walking at [place] treatment goals -    Home Blood Glucose Monitoring 07/07/2014  Check my blood sugar no home glucose monitoring     Care Management & Community Referrals:  Referral 07/07/2014  Referrals made for care management support none needed  Referrals made to community resources -

## 2014-07-27 NOTE — Progress Notes (Signed)
INTERNAL MEDICINE TEACHING ATTENDING ADDENDUM - Satara Virella, MD: I reviewed and discussed at the time of visit with the resident Dr. Hoffman, the patient's medical history, physical examination, diagnosis and results of pertinent tests and treatment and I agree with the patient's care as documented.  

## 2014-07-28 ENCOUNTER — Telehealth: Payer: Self-pay | Admitting: Cardiovascular Disease

## 2014-07-28 NOTE — Telephone Encounter (Signed)
New Message       Verdis Frederickson from Cardiac rehab calling stating that pt had a stint placed on 07/18/14 and needs a f/u. On pt's AVS and D/c summary there is not a timeframe of when pt needs to f/u. Please call pt and advise.

## 2014-07-28 NOTE — Telephone Encounter (Signed)
I spoke with the pt and she could not come to appointment that was made for her on 08/01/14. Pt has been rescheduled to 08/10/14.

## 2014-08-01 ENCOUNTER — Encounter: Payer: Self-pay | Admitting: Nurse Practitioner

## 2014-08-10 ENCOUNTER — Encounter: Payer: Self-pay | Admitting: Physician Assistant

## 2014-08-11 ENCOUNTER — Ambulatory Visit (INDEPENDENT_AMBULATORY_CARE_PROVIDER_SITE_OTHER): Payer: Medicare Other | Admitting: Cardiology

## 2014-08-11 ENCOUNTER — Encounter: Payer: Self-pay | Admitting: Cardiology

## 2014-08-11 ENCOUNTER — Telehealth: Payer: Self-pay

## 2014-08-11 VITALS — BP 150/78 | HR 60 | Ht 64.5 in | Wt 211.8 lb

## 2014-08-11 DIAGNOSIS — I2510 Atherosclerotic heart disease of native coronary artery without angina pectoris: Secondary | ICD-10-CM | POA: Diagnosis not present

## 2014-08-11 DIAGNOSIS — I10 Essential (primary) hypertension: Secondary | ICD-10-CM

## 2014-08-11 DIAGNOSIS — E785 Hyperlipidemia, unspecified: Secondary | ICD-10-CM

## 2014-08-11 NOTE — Telephone Encounter (Signed)
Calling patient to schedule hepatic and fasting lipid panel in two weeks, per Cecilie Kicks NP. Labs are ordered and scheduled and patient understood instructions.

## 2014-08-11 NOTE — Progress Notes (Signed)
Cardiology Office Note   Date:  08/12/2014   ID:  [name], DOB 08-10-1945, MRN [id number]  PCP:  Teresa Groves, DO  Cardiologist:  Dr.  Burt [name]  Chief Complaint  Patient presents with  . Hospitalization Follow-up    no chest pain no SOB      History of Present Illness: [name] is a 69 y.o. female who presents for hospital follow up after admit for chest pain.  She has hx of history of ASCAD s/p PCI of LCx and chronically occluded small RCA and last cath 2004 with patent LCx stent, renal artery stenosis s/p right renal artery stenting in 2007 with last renal arterial duplex in 2011 with patent stent, HTN, type 2 DM, dyslipidemia and GERD.  She did not have MI but did undergo cardiac cath with    Ost RCA to Prox RCA lesion, 100% stenosed.  1st Mrg lesion, 30% stenosed. The lesion was previously treated with a stent (unknown type) .  The left ventricular systolic function is normal.  Ost 1st Mrg to 1st Mrg lesion, 80% stenosed. There is a 0% residual stenosis post intervention--PCI stenting of her proximal OM1 with a Synergy 2.5 mm x 12 mm long stent at 14 atm (2.70 mm) after predilatation with a 2 mm balloon  07/18/14.  Today she has no complaints.  She is exercising by walking, watching her diet.  She would like to go to cardiac rehab and is making plans at work to do so.  Her LDL was elevated and she is now on 80 of lipitor -will recheck lipids in 2 weeks.   Past Medical History  Diagnosis Date  . Cervical polyp   . Neck mass   . Cervical lymphadenopathy   . Urinary tract infection   . Hyperlipidemia     takes Crestor daily  . Coronary artery disease     Prior stent of the lCX with chronic total occlusion of a small RCA. Last cath in 2004 showing patent stent, nonobstructive disease and total RCA. EF is 65 to 70%. She has been managed medically.   . Tubular adenoma of rectum 10/24/04  . Diabetes mellitus without complication     takes Metformin daily  .  Hypothyroidism     takes SYnthroid daily  . Hypertension     takes Amlodipine,Coreg,and Prinzide daily  . TIA (transient ischemic attack)   . Back pain     reason unknown  . History of colon polyps   . Depression     sees a Social worker at [facility] but no meds  . Umbilical hernia   . OSA (obstructive sleep apnea)     PT DENIES, STATES SHE WAS TOLD SHE DIDN'T HAVE OSA, NO CPAP  . PTSD (post-traumatic stress disorder)   . Renal artery stenosis 2007  . Stroke     TIA- LAST ONE GREATER THAN 10 YEARS PER PT  . CAD (coronary artery disease)     Past Surgical History  Procedure Laterality Date  . Cardiac catheterization  2005/2006/2007/2010  . Colonoscopy  10/2004  . Appendectomy    . Ankle surgery Left     plates and screws  . Coronary angioplasty      x 1  . Umbilical hernia repair    . Umbilical hernia repair N/A 05/27/2013    Procedure: HERNIA REPAIR UMBILICAL ADULT;  Surgeon: [name]. Teresa Dover, MD;  Location: [facility];  Service: General;  Laterality: N/A;  . Insertion of mesh N/A 05/27/2013  Procedure: INSERTION OF MESH;  Surgeon: [name]. Teresa Dover, MD;  Location: [facility];  Service: General;  Laterality: N/A;  . Cardiac catheterization N/A 07/18/2014    Procedure: Left Heart Cath and Coronary Angiography;  Surgeon: Teresa Harp, MD; LAD patent, RCA 100% with L-R collaterals, OM1 80% stenosed proximal to previously placed stent, 30% ISR, EF 55%  . Renal artery stent Bilateral 2007    Dr. Albertine [name]  . Coronary stent placement  1998    NIR 3.0 x 25 to the OM1  . Coronary stent placement  07/18/2014    SYNERGY DES 2.5X12 to the OM1      Current Outpatient Prescriptions  Medication Sig Dispense Refill  . amLODipine (NORVASC) 10 MG tablet Take 1 tablet (10 mg total) by mouth daily. 90 tablet 0  . aspirin 81 MG tablet Take 1 tablet (81 mg total) by mouth daily.    Marland Kitchen atorvastatin (LIPITOR) 80 MG tablet Take 1 tablet (80 mg total) by mouth daily. 30 tablet 11  . carvedilol (COREG)  25 MG tablet TAKE ONE-HALF TABLET BY MOUTH TWICE DAILY 30 tablet 10  . clopidogrel (PLAVIX) 75 MG tablet Take 1 tablet (75 mg total) by mouth daily with breakfast. 30 tablet 11  . docusate sodium (COLACE) 100 MG capsule Take 1 capsule (100 mg total) by mouth every 12 (twelve) hours. 30 capsule 0  . levothyroxine (SYNTHROID, LEVOTHROID) 125 MCG tablet TAKE ONE TABLET BY MOUTH ONCE DAILY BEFORE BREAKFAST 30 tablet 5  . lisinopril-hydrochlorothiazide (PRINZIDE,ZESTORETIC) 20-12.5 MG per tablet Take 1 tablet by mouth 2 (two) times daily. 60 tablet 11  . metFORMIN (GLUCOPHAGE XR) 500 MG 24 hr tablet Take 1 tablet (500 mg total) by mouth daily with breakfast. 30 tablet 11  . nitroGLYCERIN (NITROSTAT) 0.4 MG SL tablet Place 0.4 mg under the tongue every 5 (five) minutes as needed for chest pain. Up to 3 doses     No current facility-administered medications for this visit.    Allergies:   Review of patient's allergies indicates no known allergies.    Social History:  The patient  reports that she has quit smoking. She has never used smokeless tobacco. She reports that she drinks alcohol. She reports that she does not use illicit drugs.   Family History:  The patient's family history includes Alzheimer's disease in her father; Hypertension in her mother; Osteoarthritis in her mother. There is no history of Cancer, Stroke, Colon cancer, Esophageal cancer, Stomach cancer, or Rectal cancer.    ROS:  General:no colds or fevers, no weight changes Skin:no rashes or ulcers HEENT:no blurred vision, no congestion CV:see HPI PUL:see HPI GI:no diarrhea constipation or melena, no indigestion GU:no hematuria, no dysuria MS:no joint pain, no claudication Neuro:no syncope, no lightheadedness Endo:+ diabetes stable, no thyroid disease  Wt Readings from Last 3 Encounters:  08/11/14 211 lb 12.8 oz (96.072 kg)  07/26/14 214 lb 4.8 oz (97.206 kg)  07/19/14 217 lb 2.5 oz (98.5 kg)     PHYSICAL EXAM: VS:  BP  150/78 mmHg  Pulse 60  Ht 5' 4.5" (1.638 m)  Wt 211 lb 12.8 oz (96.072 kg)  BMI 35.81 kg/m2 , BMI Body mass index is 35.81 kg/(m^2). General:Pleasant affect, NAD Skin:Warm and dry, brisk capillary refill HEENT:normocephalic, sclera clear, mucus membranes moist Neck:supple, no JVD, no bruits  Heart:S1S2 RRR without murmur, gallup, rub or click Lungs:clear without rales, rhonchi, or wheezes JQZ:ESPQ, non tender, + BS, do not palpate liver spleen or masses Ext:no lower ext  edema, 2+ pedal pulses, 2+ radial pulses Neuro:alert and oriented, MAE, follows commands, + facial symmetry    EKG:  EKG is ordered today. The ekg ordered today demonstrates SR at 60 PR 218 ms now T wave inversion V6, otherwise no acute changes   Recent Labs: 07/07/2014: TSH 3.682 07/18/2014: ALT 15; Magnesium 2.0 07/19/2014: Hemoglobin 11.3*; Platelets 285 07/26/2014: BUN 13; Creat 0.88; Potassium 3.8; Sodium 142    Lipid Panel    Component Value Date/Time   CHOL 183 07/07/2014 0948   TRIG 113 07/07/2014 0948   HDL 32* 07/07/2014 0948   CHOLHDL 5.7 07/07/2014 0948   VLDL 23 07/07/2014 0948   LDLCALC 128* 07/07/2014 0948   LDLDIRECT 196.8 07/11/2010 1107       Other studies Reviewed: Additional studies/ records that were reviewed today include: cardiac cath, hospital discharge.   ASSESSMENT AND PLAN:  1.  CAD with recent unstable angina undergoing cath and PCI with stent to prox OM1.  Doing well without further pain.  Continue current meds DAPT for 1 year. Follow up with Dr. Burt [name] in 3 months.  2.  HTN elevated but on recheck 128/76.  No change in meds  3. Hyperlipidemia continue statin, recheck lipid and hepatic in 2 weeks.      Current medicines are reviewed with the patient today.  The patient Has no concerns regarding medicines.  The following changes have been made:  See above Labs/ tests ordered today include:see above  Disposition:   FU:  see above  Lennie Muckle, NP    08/12/2014 10:19 PM    [facility] [place], [place], [place] [place], [place] Phone: [phone]; Fax: [phone]

## 2014-08-11 NOTE — Patient Instructions (Signed)
Medication Instructions:  Your physician recommends that you continue on your current medications as directed. Please refer to the Current Medication list given to you today.  Labwork: NONE  Testing/Procedures: NONE  Follow-Up: Your physician recommends that you schedule a follow-up appointment in: 3 months with Dr. Burt Knack.   Any Other Special Instructions Will Be Listed Below (If Applicable).

## 2014-08-25 ENCOUNTER — Other Ambulatory Visit (INDEPENDENT_AMBULATORY_CARE_PROVIDER_SITE_OTHER): Payer: Medicare Other | Admitting: *Deleted

## 2014-08-25 DIAGNOSIS — E785 Hyperlipidemia, unspecified: Secondary | ICD-10-CM

## 2014-08-25 LAB — LIPID PANEL
Cholesterol: 202 mg/dL — ABNORMAL HIGH (ref 0–200)
HDL: 33.2 mg/dL — ABNORMAL LOW (ref >39.00)
LDL Cholesterol: 142 mg/dL — ABNORMAL HIGH (ref 0–99)
NonHDL: 168.8
Total CHOL/HDL Ratio: 6
Triglycerides: 134 mg/dL (ref 0.0–149.0)
VLDL: 26.8 mg/dL (ref 0.0–40.0)

## 2014-08-25 LAB — HEPATIC FUNCTION PANEL
ALT: 13 U/L (ref 0–35)
AST: 12 U/L (ref 0–37)
Albumin: 3.7 g/dL (ref 3.5–5.2)
Alkaline Phosphatase: 61 U/L (ref 39–117)
Bilirubin, Direct: 0.1 mg/dL (ref 0.0–0.3)
Total Bilirubin: 0.5 mg/dL (ref 0.2–1.2)
Total Protein: 7 g/dL (ref 6.0–8.3)

## 2014-10-06 ENCOUNTER — Other Ambulatory Visit: Payer: Self-pay | Admitting: Internal Medicine

## 2014-12-20 ENCOUNTER — Encounter: Payer: Self-pay | Admitting: Student

## 2015-01-25 ENCOUNTER — Telehealth: Payer: Self-pay | Admitting: Cardiovascular Disease

## 2015-01-25 NOTE — Telephone Encounter (Signed)
Pt scheduled to see Dr Curt Bears ([facility]) on 01/26/15 for further evaluation.

## 2015-01-25 NOTE — Telephone Encounter (Signed)
I spoke with the pt and she complains of off and on episodes of SOB, chest discomfort and nausea. The pt has taken NTG with some of these episodes and had improvement in symptoms. This has been ongoing for 2-3 days and is brought on by exertion.  In general the pt said she does not feel well. Today the pt has not been exerting herself so she has not had symptoms. I made the pt aware that I will contact her with our recommendations.

## 2015-01-25 NOTE — Telephone Encounter (Signed)
New Message  Pt c/o Shortness Of Breath: STAT if SOB developed within the last 24 hours or pt is noticeably SOB on the phone  1. Are you currently SOB (can you hear that pt is SOB on the phone)? No  2. How long have you been experiencing SOB? Just within the 24 Hours  3. Are you SOB when sitting or when up moving around? Moving around.   Comments: Pt called states that yesterday walking around the house she had to sit down for a few moments because she felt as if she was breathing short. She took a nitro to feel better. And she felt nauseated.

## 2015-01-26 ENCOUNTER — Ambulatory Visit (INDEPENDENT_AMBULATORY_CARE_PROVIDER_SITE_OTHER): Payer: Medicare Other | Admitting: Cardiology

## 2015-01-26 ENCOUNTER — Ambulatory Visit (INDEPENDENT_AMBULATORY_CARE_PROVIDER_SITE_OTHER): Payer: Medicare Other | Admitting: Internal Medicine

## 2015-01-26 ENCOUNTER — Encounter: Payer: Self-pay | Admitting: Internal Medicine

## 2015-01-26 ENCOUNTER — Telehealth (HOSPITAL_COMMUNITY): Payer: Self-pay | Admitting: *Deleted

## 2015-01-26 ENCOUNTER — Encounter: Payer: Self-pay | Admitting: Cardiology

## 2015-01-26 ENCOUNTER — Other Ambulatory Visit: Payer: Self-pay | Admitting: Internal Medicine

## 2015-01-26 VITALS — BP 196/84 | HR 71 | Temp 98.1°F | Ht 64.0 in | Wt 215.4 lb

## 2015-01-26 VITALS — BP 160/96 | HR 60 | Ht 64.5 in | Wt 212.0 lb

## 2015-01-26 DIAGNOSIS — L989 Disorder of the skin and subcutaneous tissue, unspecified: Secondary | ICD-10-CM

## 2015-01-26 DIAGNOSIS — R8299 Other abnormal findings in urine: Secondary | ICD-10-CM

## 2015-01-26 DIAGNOSIS — E1151 Type 2 diabetes mellitus with diabetic peripheral angiopathy without gangrene: Secondary | ICD-10-CM

## 2015-01-26 DIAGNOSIS — E785 Hyperlipidemia, unspecified: Secondary | ICD-10-CM

## 2015-01-26 DIAGNOSIS — L918 Other hypertrophic disorders of the skin: Secondary | ICD-10-CM | POA: Diagnosis not present

## 2015-01-26 DIAGNOSIS — Z1231 Encounter for screening mammogram for malignant neoplasm of breast: Secondary | ICD-10-CM

## 2015-01-26 DIAGNOSIS — I10 Essential (primary) hypertension: Secondary | ICD-10-CM

## 2015-01-26 DIAGNOSIS — I25119 Atherosclerotic heart disease of native coronary artery with unspecified angina pectoris: Secondary | ICD-10-CM | POA: Diagnosis not present

## 2015-01-26 DIAGNOSIS — R82998 Other abnormal findings in urine: Secondary | ICD-10-CM

## 2015-01-26 DIAGNOSIS — Z0000 Encounter for general adult medical examination without abnormal findings: Secondary | ICD-10-CM

## 2015-01-26 DIAGNOSIS — N390 Urinary tract infection, site not specified: Secondary | ICD-10-CM | POA: Insufficient documentation

## 2015-01-26 DIAGNOSIS — R0602 Shortness of breath: Secondary | ICD-10-CM

## 2015-01-26 DIAGNOSIS — R0789 Other chest pain: Secondary | ICD-10-CM

## 2015-01-26 LAB — POCT URINALYSIS DIPSTICK
Bilirubin, UA: NEGATIVE
Glucose, UA: NEGATIVE
Ketones, UA: NEGATIVE
Nitrite, UA: POSITIVE
Protein, UA: NEGATIVE
Spec Grav, UA: 1.015
Urobilinogen, UA: 0.2
pH, UA: 6.5

## 2015-01-26 LAB — POCT GLYCOSYLATED HEMOGLOBIN (HGB A1C): Hemoglobin A1C: 6.9

## 2015-01-26 LAB — GLUCOSE, CAPILLARY: Glucose-Capillary: 117 mg/dL — ABNORMAL HIGH (ref 65–99)

## 2015-01-26 MED ORDER — NITROFURANTOIN MONOHYD MACRO 100 MG PO CAPS: 100.0000 mg | ORAL_CAPSULE | Freq: Two times a day (BID) | ORAL | Status: AC

## 2015-01-26 MED ORDER — ISOSORBIDE MONONITRATE ER 30 MG PO TB24
30.0000 mg | ORAL_TABLET | Freq: Every day | ORAL | Status: DC
Start: 2015-01-26 — End: 2015-06-16

## 2015-01-26 NOTE — Assessment & Plan Note (Signed)
Due for mammogram. Ordered.

## 2015-01-26 NOTE — Assessment & Plan Note (Signed)
HPI: Has been following up with cardiology.  Earlier today she saw cardiology and was started on IMDUR for stable angina.  She has a stress test ordered and scheduled for next week.  A: CAD  P: - Continue ASA and Plavix - Continue Atorvastatin 80, last LDL was not at goal, will repeat today and may consider adding zetia.

## 2015-01-26 NOTE — Patient Instructions (Signed)
General Instructions:  Please keep your appointment for the stress test.  You are doing great with the diabetes.  Keep up the good work!  I want you to take Macrobid 100mg  twice a day for 3 days.  Please bring your medicines with you each time you come to clinic.  Medicines may include prescription medications, over-the-counter medications, herbal remedies, eye drops, vitamins, or other pills.   Progress Toward Treatment Goals:  Treatment Goal 07/26/2014  Hemoglobin A1C at goal  Blood pressure at goal    Self Care Goals & Plans:  Self Care Goal 07/26/2014  Manage my medications take my medicines as prescribed; bring my medications to every visit; refill my medications on time  Monitor my health keep track of my blood glucose; bring my glucose meter and log to each visit  Eat healthy foods eat more vegetables; eat foods that are low in salt; eat baked foods instead of fried foods  Be physically active -  Other -  Meeting treatment goals -    Home Blood Glucose Monitoring 07/26/2014  Check my blood sugar no home glucose monitoring     Care Management & Community Referrals:  Referral 07/26/2014  Referrals made for care management support none needed  Referrals made to community resources -     Urinary Tract Infection Urinary tract infections (UTIs) can develop anywhere along your urinary tract. Your urinary tract is your body's drainage system for removing wastes and extra water. Your urinary tract includes two kidneys, two ureters, a bladder, and a urethra. Your kidneys are a pair of bean-shaped organs. Each kidney is about the size of your fist. They are located below your ribs, one on each side of your spine. CAUSES Infections are caused by microbes, which are microscopic organisms, including fungi, viruses, and bacteria. These organisms are so small that they can only be seen through a microscope. Bacteria are the microbes that most commonly cause UTIs. SYMPTOMS  Symptoms  of UTIs may vary by age and gender of the patient and by the location of the infection. Symptoms in young women typically include a frequent and intense urge to urinate and a painful, burning feeling in the bladder or urethra during urination. Older women and men are more likely to be tired, shaky, and weak and have muscle aches and abdominal pain. A fever may mean the infection is in your kidneys. Other symptoms of a kidney infection include pain in your back or sides below the ribs, nausea, and vomiting. DIAGNOSIS To diagnose a UTI, your caregiver will ask you about your symptoms. Your caregiver will also ask you to provide a urine sample. The urine sample will be tested for bacteria and white blood cells. White blood cells are made by your body to help fight infection. TREATMENT  Typically, UTIs can be treated with medication. Because most UTIs are caused by a bacterial infection, they usually can be treated with the use of antibiotics. The choice of antibiotic and length of treatment depend on your symptoms and the type of bacteria causing your infection. HOME CARE INSTRUCTIONS  If you were prescribed antibiotics, take them exactly as your caregiver instructs you. Finish the medication even if you feel better after you have only taken some of the medication.  Drink enough water and fluids to keep your urine clear or pale yellow.  Avoid caffeine, tea, and carbonated beverages. They tend to irritate your bladder.  Empty your bladder often. Avoid holding urine for long periods of time.  Empty  your bladder before and after sexual intercourse.  After a bowel movement, women should cleanse from front to back. Use each tissue only once. SEEK MEDICAL CARE IF:   You have back pain.  You develop a fever.  Your symptoms do not begin to resolve within 3 days. SEEK IMMEDIATE MEDICAL CARE IF:   You have severe back pain or lower abdominal pain.  You develop chills.  You have nausea or  vomiting.  You have continued burning or discomfort with urination. MAKE SURE YOU:   Understand these instructions.  Will watch your condition.  Will get help right away if you are not doing well or get worse.   This information is not intended to replace advice given to you by your health care provider. Make sure you discuss any questions you have with your health care provider.   Document Released: 10/31/2004 Document Revised: 10/12/2014 Document Reviewed: 03/01/2011 Elsevier Interactive Patient Education [employer].

## 2015-01-26 NOTE — Progress Notes (Signed)
[facility] Subjective:   [name] female   DOB: November 23, 1945 69 y.o.   MRN: [id number]  HPI: [name] is a 69 y.o. female with a PMH detailed below who presents for evaluations of right foot skin lesions.    Please see problem based charting below for further details on the status of her chronic medical problems.  Past Medical History  Diagnosis Date  . Cervical polyp   . Neck mass   . Cervical lymphadenopathy   . Urinary tract infection   . Hyperlipidemia     takes Crestor daily  . Coronary artery disease     Prior stent of the lCX with chronic total occlusion of a small RCA. Last cath in 2004 showing patent stent, nonobstructive disease and total RCA. EF is 65 to 70%. She has been managed medically.   . Tubular adenoma of rectum 10/24/04  . Diabetes mellitus without complication ([place])     takes Metformin daily  . Hypothyroidism     takes SYnthroid daily  . Hypertension     takes Amlodipine,Coreg,and Prinzide daily  . TIA (transient ischemic attack)   . Back pain     reason unknown  . History of colon polyps   . Depression     sees a Social worker at [facility] but no meds  . Umbilical hernia   . OSA (obstructive sleep apnea)     PT DENIES, STATES SHE WAS TOLD SHE DIDN'T HAVE OSA, NO CPAP  . PTSD (post-traumatic stress disorder)   . Renal artery stenosis ([place]) 2007  . Stroke ([place])     TIA- LAST ONE GREATER THAN 10 YEARS PER PT  . CAD (coronary artery disease)    Current Outpatient Prescriptions  Medication Sig Dispense Refill  . amLODipine (NORVASC) 10 MG tablet Take 1 tablet (10 mg total) by mouth daily. 90 tablet 0  . aspirin 81 MG tablet Take 1 tablet (81 mg total) by mouth daily.    Marland Kitchen atorvastatin (LIPITOR) 80 MG tablet Take 1 tablet (80 mg total) by mouth daily. 30 tablet 11  . carvedilol (COREG) 25 MG tablet TAKE ONE-HALF TABLET BY MOUTH TWICE DAILY 30 tablet 10  . clopidogrel (PLAVIX) 75 MG tablet Take 1 tablet (75 mg  total) by mouth daily with breakfast. 30 tablet 11  . docusate sodium (COLACE) 100 MG capsule Take 1 capsule (100 mg total) by mouth every 12 (twelve) hours. 30 capsule 0  . isosorbide mononitrate (IMDUR) 30 MG 24 hr tablet Take 1 tablet (30 mg total) by mouth daily. 30 tablet 3  . levothyroxine (SYNTHROID, LEVOTHROID) 125 MCG tablet TAKE ONE TABLET BY MOUTH ONCE DAILY BEFORE BREAKFAST 30 tablet 3  . lisinopril-hydrochlorothiazide (PRINZIDE,ZESTORETIC) 20-12.5 MG per tablet Take 1 tablet by mouth 2 (two) times daily. 60 tablet 11  . metFORMIN (GLUCOPHAGE XR) 500 MG 24 hr tablet Take 1 tablet (500 mg total) by mouth daily with breakfast. 30 tablet 11  . nitrofurantoin, macrocrystal-monohydrate, (MACROBID) 100 MG capsule Take 1 capsule (100 mg total) by mouth 2 (two) times daily. 6 capsule 0  . nitroGLYCERIN (NITROSTAT) 0.4 MG SL tablet Place 0.4 mg under the tongue every 5 (five) minutes as needed for chest pain. Up to 3 doses     No current facility-administered medications for this visit.   Family History  Problem Relation Age of Onset  . Hypertension Mother   . Osteoarthritis Mother   . Alzheimer's disease Father   . Cancer Neg  Hx   . Stroke Neg Hx   . Colon cancer Neg Hx   . Esophageal cancer Neg Hx   . Stomach cancer Neg Hx   . Rectal cancer Neg Hx    Social History   Social History  . Marital Status: Single    Spouse Name: N/A  . Number of Children: N/A  . Years of Education: N/A   Social History Main Topics  . Smoking status: Former Research scientist (life sciences)  . Smokeless tobacco: Never Used     Comment: quit smoking about 30 yrs ago  . Alcohol Use: 0.0 oz/week    0 Standard drinks or equivalent per week     Comment: Wine on special occasions  . Drug Use: No  . Sexual Activity: Not Asked   Other Topics Concern  . None   Social History Narrative   Review of Systems: Review of Systems  Constitutional: Negative for fever and chills.  Eyes: Negative for blurred vision.  Respiratory:  Negative for cough and shortness of breath.   Cardiovascular: Negative for chest pain, palpitations and leg swelling.  Gastrointestinal: Negative for heartburn and abdominal pain.  Genitourinary: Negative for dysuria.  Musculoskeletal: Negative for myalgias.  Skin: Negative for rash.  Neurological: Negative for headaches.     Objective:  Physical Exam: Filed Vitals:   01/26/15 1338  BP: 196/84  Pulse: 71  Temp: 98.1 F (36.7 C)  TempSrc: Oral  Height: 5\' 4"  (1.626 m)  Weight: 215 lb 6.4 oz (97.705 kg)  SpO2: 100%   Physical Exam  Constitutional: She is well-developed, well-nourished, and in no distress. No distress.  HENT:  Head: Normocephalic and atraumatic.  Eyes: Conjunctivae are normal.  Cardiovascular: Normal rate, regular rhythm, normal heart sounds and intact distal pulses.   No murmur heard. Pulmonary/Chest: Effort normal and breath sounds normal. No respiratory distress. She has no wheezes. She has no rales.  Abdominal: Soft. Bowel sounds are normal. She exhibits no distension. There is no tenderness.  Musculoskeletal: She exhibits no edema.  Skin: Skin is warm and dry. She is not diaphoretic.  She has 3 circular ~1cm hyperpigmented skin lesions on the dorsum of her left foot.  The skin at these areas appears somewhat callused.  Additionally she has a less pigmented circular lesion on her right posterior calf that is <1cm in diameter, it is minimally raised.  She has multiple areas of skin tags around her neck, the largest of which are on the right side.  Psychiatric: Affect normal.  Nursing note and vitals reviewed.   Right foot  Assessment & Plan:  Case discussed with Dr. Eppie Gibson  Coronary atherosclerosis HPI: Has been following up with cardiology.  Earlier today she saw cardiology and was started on IMDUR for stable angina.  She has a stress test ordered and scheduled for next week.  A: CAD  P: - Continue ASA and Plavix - Continue Atorvastatin 80, last LDL  was not at goal, will repeat today and may consider adding zetia.  Essential hypertension HPI: Taking her medications, no issues.  BP was 160/96 at cardiology earlier today.  A: Essential HTN, not at goal  P: Given she has just been started on IMDUR will hold off on further changes of her medication at this time.  DM (diabetes mellitus), type 2 with peripheral vascular complications (HCC) HPI: Taking metformin daily, no issues.  Denies polyuria, polydispia.  A: Controlled T2DM with diabetic peripheral vascular disease  P: A1c at goal 6.9%  Will continue metformin  XR 500mg  dialy  Cutaneous skin tags HPI: Patient inquires about how to get rid of her skin tags that are on her neck. She reports they are very bothersome and painfull when they get pulled on by her clothing.  A: Skin tags  P: I offered to remove the skin tags in office.  Was able to remove the largest 6 tags from her left neck under supervision of Dr Daryll Drown.  Skin lesion of right lower extremity HPI: She presents today concerned about skin lesions.  She has 3 lesion on her foot and a single <1cm lesion on her right posterior calf.  She thinks she first noticed the lesion on her right 2nd toe in March along with the lesion on her posterior calf.  The other two lesions on her right foot have appeared since that time and all have grown in size.  She reports a significant family history of cancer but no one has had any skin cancer that she recalls.  She has never had any skin biopsies before. She denies any trauma to the back of her calf. Denies any bug bites.  She has not had any recent changes in foot ware and reports she mostly wears bedroom slippers.  A:  Skin lesion of right posterior calf 3 skin lesions of right dorsal foot>> likely reactive to trauma from footware  P: For the skin lesion of the right posterior calf I will refer her over to dermatology for evaluation For the skin lesions of her right foot, these appear  suggestive of repeatative trauma likely from foot ware and I have asked her to get some OTC band-aid callus cushions to try to wear and monitor.  If this does not improve she can also have this evaluated when she sees dermatology.  UTI (urinary tract infection), uncomplicated HPI: She reports her urine has been darker lately and she has had some increased urinary frequency.  She denies any dysuria, flank pain, or fever.  A: Uncomplicated UTI  P:  Urine dip is c/w UTI.  Given her DM is well controlled and she does not have other risk factors I will treat her as uncomplicated UTI.  I have prescribed 3 days of Macrobid.  Hyperlipidemia HPI: Taking atrovastatin  A: HLD  P: LDL not at goal at last check. Repeat Lipid panel today, if remains not at goal may consider addition of zetia.  Preventative health care - Due for mammogram. Ordered.    Medications Ordered Meds ordered this encounter  Medications  . nitrofurantoin, macrocrystal-monohydrate, (MACROBID) 100 MG capsule    Sig: Take 1 capsule (100 mg total) by mouth 2 (two) times daily.    Dispense:  6 capsule    Refill:  0   Other Orders Orders Placed This Encounter  Procedures  . MM Digital Screening    Standing Status: Future     Number of Occurrences:      Standing Expiration Date: 03/28/2016    Order Specific Question:  Reason for Exam (SYMPTOM  OR DIAGNOSIS REQUIRED)    Answer:  screening mammogram    Order Specific Question:  Preferred imaging location?    Answer:  [facility]  . Lipid Profile  . Glucose, capillary  . Ambulatory referral to Dermatology    Referral Priority:  Routine    Referral Type:  Consultation    Referral Reason:  Specialty Services Required    Requested Specialty:  Dermatology    Number of Visits Requested:  1  . POCT glycosylated  hemoglobin (Hb A1C)  . POCT Urinalysis Dipstick (81002)   Follow Up: Return in about 3 months (around 04/26/2015).

## 2015-01-26 NOTE — Assessment & Plan Note (Signed)
HPI: She reports her urine has been darker lately and she has had some increased urinary frequency.  She denies any dysuria, flank pain, or fever.  A: Uncomplicated UTI  P:  Urine dip is c/w UTI.  Given her DM is well controlled and she does not have other risk factors I will treat her as uncomplicated UTI.  I have prescribed 3 days of Macrobid.

## 2015-01-26 NOTE — Assessment & Plan Note (Signed)
HPI: Patient inquires about how to get rid of her skin tags that are on her neck. She reports they are very bothersome and painfull when they get pulled on by her clothing.  A: Skin tags  P: I offered to remove the skin tags in office.  Was able to remove the largest 6 tags from her left neck under supervision of Dr Daryll Drown.

## 2015-01-26 NOTE — Assessment & Plan Note (Signed)
HPI: She presents today concerned about skin lesions.  She has 3 lesion on her foot and a single <1cm lesion on her right posterior calf.  She thinks she first noticed the lesion on her right 2nd toe in March along with the lesion on her posterior calf.  The other two lesions on her right foot have appeared since that time and all have grown in size.  She reports a significant family history of cancer but no one has had any skin cancer that she recalls.  She has never had any skin biopsies before. She denies any trauma to the back of her calf. Denies any bug bites.  She has not had any recent changes in foot ware and reports she mostly wears bedroom slippers.  A:  Skin lesion of right posterior calf 3 skin lesions of right dorsal foot>> likely reactive to trauma from footware  P: For the skin lesion of the right posterior calf I will refer her over to dermatology for evaluation For the skin lesions of her right foot, these appear suggestive of repeatative trauma likely from foot ware and I have asked her to get some OTC band-aid callus cushions to try to wear and monitor.  If this does not improve she can also have this evaluated when she sees dermatology.

## 2015-01-26 NOTE — Progress Notes (Signed)
Procedure Note PRE-OP DIAGNOSIS: Cutaneous Skin tag POST-OP DIAGNOSIS: Same  PROCEDURE: skin lesion excision  Performing Physician: Dr Heber Choudrant Supervising Physician (if applicable):Dr Daryll Drown PROCEDURE:  _ Shave Biopsy X Scissors _ Cryotherapy _ Punch (Size _)  The area surrounding the skin lesion was prepared with alcohol wipes. 2cc of 1% lidocaine was used to numb the area. The lesion was removed in the usual manner by the biopsy method noted above. Hemostasis was assured. 6 skin tags were removed form the anterior right neck and shoulder. Closure: _ Monsel's for hemostasis _ suture X None, silver nitre was used to control hemostasis of 1 lesion. Followup: The patient tolerated the procedure well without complications. Standard post-procedure care is explained and return precautions are given.

## 2015-01-26 NOTE — Assessment & Plan Note (Signed)
HPI: Taking her medications, no issues.  BP was 160/96 at cardiology earlier today.  A: Essential HTN, not at goal  P: Given she has just been started on IMDUR will hold off on further changes of her medication at this time.

## 2015-01-26 NOTE — Progress Notes (Signed)
Cardiology Office Note   Date:  01/26/2015   ID:  [name], DOB 1945-05-02, MRN [id number]  PCP:  Lucious Groves, DO  Cardiologist:  Burt Knack Primary Electrophysiologist:  Constance Haw, MD    Chief Complaint  Patient presents with  . Follow-up  . Shortness of Breath  . chest discomfort     History of Present Illness: [name] is a 69 y.o. female who presents today for cardiology evaluation.   She has a history of coronary artery disease status post PCI of the circumflex and a chronically occluded RCA from 2004 with, renal artery stenosis status post right renal artery stenting in 2007, hypertension, type 2 diabetes, hyperlipidemia, and GERD. She did not have an an MI but did undergo cardiac catheterization which showed  Ost RCA to Prox RCA lesion, 100% stenosed.  1st Mrg lesion, 30% stenosed. The lesion was previously treated with a stent (unknown type) .  The left ventricular systolic function is normal.  Ost 1st Mrg to 1st Mrg lesion, 80% stenosed. There is a 0% residual stenosis post intervention--PCI stenting of her proximal OM1 with a Synergy 2.5 mm x 12 mm long stent at 14 atm (2.70 mm) after predilatation with a 2 mm balloon 07/18/14 She's been having intermittent chest pain in the center for chest and doesn't radiate. She says that the pain is worse with exertion and emotional stress but improves with rest. She has been taking nitroglycerin which also improves the chest pain.  Today, she denies symptoms of palpitations, orthopnea, PND, lower extremity edema, claudication, dizziness, presyncope, syncope, bleeding, or neurologic sequela. The patient is tolerating medications without difficulties and is otherwise without complaint today.    Past Medical History  Diagnosis Date  . Cervical polyp   . Neck mass   . Cervical lymphadenopathy   . Urinary tract infection   . Hyperlipidemia     takes Crestor daily  . Coronary artery disease     Prior stent of the  lCX with chronic total occlusion of a small RCA. Last cath in 2004 showing patent stent, nonobstructive disease and total RCA. EF is 65 to 70%. She has been managed medically.   . Tubular adenoma of rectum 10/24/04  . Diabetes mellitus without complication ([place])     takes Metformin daily  . Hypothyroidism     takes SYnthroid daily  . Hypertension     takes Amlodipine,Coreg,and Prinzide daily  . TIA (transient ischemic attack)   . Back pain     reason unknown  . History of colon polyps   . Depression     sees a Social worker at [facility] but no meds  . Umbilical hernia   . OSA (obstructive sleep apnea)     PT DENIES, STATES SHE WAS TOLD SHE DIDN'T HAVE OSA, NO CPAP  . PTSD (post-traumatic stress disorder)   . Renal artery stenosis ([place]) 2007  . Stroke ([place])     TIA- LAST ONE GREATER THAN 10 YEARS PER PT  . CAD (coronary artery disease)    Past Surgical History  Procedure Laterality Date  . Cardiac catheterization  2005/2006/2007/2010  . Colonoscopy  10/2004  . Appendectomy    . Ankle surgery Left     plates and screws  . Coronary angioplasty      x 1  . Umbilical hernia repair    . Umbilical hernia repair N/A 05/27/2013    Procedure: HERNIA REPAIR UMBILICAL ADULT;  Surgeon: Imogene Burn. Georgette Dover, MD;  Location:  [place] [facility];  Service: General;  Laterality: N/A;  . Insertion of mesh N/A 05/27/2013    Procedure: INSERTION OF MESH;  Surgeon: Imogene Burn. Georgette Dover, MD;  Location: [place];  Service: General;  Laterality: N/A;  . Cardiac catheterization N/A 07/18/2014    Procedure: Left Heart Cath and Coronary Angiography;  Surgeon: Lorretta Harp, MD; LAD patent, RCA 100% with L-R collaterals, OM1 80% stenosed proximal to previously placed stent, 30% ISR, EF 55%  . Renal artery stent Bilateral 2007    Dr. Albertine Patricia  . Coronary stent placement  1998    NIR 3.0 x 25 to the OM1  . Coronary stent placement  07/18/2014    SYNERGY DES 2.5X12 to the OM1      Current Outpatient Prescriptions  Medication  Sig Dispense Refill  . amLODipine (NORVASC) 10 MG tablet Take 1 tablet (10 mg total) by mouth daily. 90 tablet 0  . aspirin 81 MG tablet Take 1 tablet (81 mg total) by mouth daily.    Marland Kitchen atorvastatin (LIPITOR) 80 MG tablet Take 1 tablet (80 mg total) by mouth daily. 30 tablet 11  . carvedilol (COREG) 25 MG tablet TAKE ONE-HALF TABLET BY MOUTH TWICE DAILY 30 tablet 10  . clopidogrel (PLAVIX) 75 MG tablet Take 1 tablet (75 mg total) by mouth daily with breakfast. 30 tablet 11  . docusate sodium (COLACE) 100 MG capsule Take 1 capsule (100 mg total) by mouth every 12 (twelve) hours. 30 capsule 0  . levothyroxine (SYNTHROID, LEVOTHROID) 125 MCG tablet TAKE ONE TABLET BY MOUTH ONCE DAILY BEFORE BREAKFAST 30 tablet 3  . lisinopril-hydrochlorothiazide (PRINZIDE,ZESTORETIC) 20-12.5 MG per tablet Take 1 tablet by mouth 2 (two) times daily. 60 tablet 11  . metFORMIN (GLUCOPHAGE XR) 500 MG 24 hr tablet Take 1 tablet (500 mg total) by mouth daily with breakfast. 30 tablet 11  . nitroGLYCERIN (NITROSTAT) 0.4 MG SL tablet Place 0.4 mg under the tongue every 5 (five) minutes as needed for chest pain. Up to 3 doses     No current facility-administered medications for this visit.    Allergies:   Review of patient's allergies indicates no known allergies.   Social History:  The patient  reports that she has quit smoking. She has never used smokeless tobacco. She reports that she drinks alcohol. She reports that she does not use illicit drugs.   Family History:  The patient's  family history includes Alzheimer's disease in her father; Hypertension in her mother; Osteoarthritis in her mother. There is no history of Cancer, Stroke, Colon cancer, Esophageal cancer, Stomach cancer, or Rectal cancer.    ROS:  Please see the history of present illness.   Otherwise, review of systems is positive for chest pain, wheezing, shortness of breath.   All other systems are reviewed and negative.    PHYSICAL EXAM: VS:  Ht 5'  4.5" (1.638 m)  Wt 212 lb (96.163 kg)  BMI 35.84 kg/m2 , BMI Body mass index is 35.84 kg/(m^2). GEN: Well nourished, well developed, in no acute distress HEENT: normal Neck: no JVD, carotid bruits, or masses Cardiac: RRR; no murmurs, rubs, or gallops,no edema  Respiratory:  clear to auscultation bilaterally, normal work of breathing GI: soft, nontender, nondistended, + BS MS: no deformity or atrophy Skin: warm and dry Neuro:  Strength and sensation are intact Psych: euthymic mood, full affect  EKG:  EKG is ordered today. The ekg ordered today shows sinus rhythm, lateral TWI (old)  Recent Labs: 07/07/2014: TSH 3.682 07/18/2014:  Magnesium 2.0 07/19/2014: Hemoglobin 11.3*; Platelets 285 07/26/2014: BUN 13; Creat 0.88; Potassium 3.8; Sodium 142 08/25/2014: ALT 13    Lipid Panel     Component Value Date/Time   CHOL 202* 08/25/2014 0741   TRIG 134.0 08/25/2014 0741   HDL 33.20* 08/25/2014 0741   CHOLHDL 6 08/25/2014 0741   VLDL 26.8 08/25/2014 0741   LDLCALC 142* 08/25/2014 0741   LDLDIRECT 196.8 07/11/2010 1107     Wt Readings from Last 3 Encounters:  01/26/15 212 lb (96.163 kg)  08/11/14 211 lb 12.8 oz (96.072 kg)  07/26/14 214 lb 4.8 oz (97.206 kg)      Other studies Reviewed: Additional studies/ records that were reviewed today include: Cath 07/18/14 Review of the above records today demonstrates:   Ost RCA to Prox RCA lesion, 100% stenosed.  1st Mrg lesion, 30% stenosed. The lesion was previously treated with a stent (unknown type) .  The left ventricular systolic function is normal.  Ost 1st Mrg to 1st Mrg lesion, 80% stenosed. There is a 0% residual stenosis post intervention.  ASSESSMENT AND PLAN:  1.  Chest pain: Patient complains of chest pain that is worse with exertion but improved with rest also worse with emotional stressors. She has been taking nitroglycerin for her pain which seems to improve the discomfort. Because she is having more pain I have  prescribed her indoor which should help with the amount of pain she has. I have also scheduled her for a nuclear stress test to determine if she has any further ischemia. She has had interventions in the past.    Current medicines are reviewed at length with the patient today.   The patient does not have concerns regarding her medicines.  The following changes were made today:  imdur  Labs/ tests ordered today include:  No orders of the defined types were placed in this encounter.     Disposition:   FU with Cecilie Kicks post stress testing  Signed, Will Meredith Leeds, MD  01/26/2015 10:28 AM     [address] [place] [place] [place] [address] [phone] (office) [phone] (fax)

## 2015-01-26 NOTE — Assessment & Plan Note (Signed)
HPI: Taking metformin daily, no issues.  Denies polyuria, polydispia.  A: Controlled T2DM with diabetic peripheral vascular disease  P: A1c at goal 6.9%  Will continue metformin XR 500mg  dialy

## 2015-01-26 NOTE — Patient Instructions (Addendum)
Medication Instructions:  Your physician has recommended you make the following change in your medication: 1) START Imdur 30 mg daily  Labwork: None ordered  Testing/Procedures: Your physician has requested that you have a lexiscan myoview. For further information please visit [email or web address]. Please follow instruction sheet, as given.  Follow-Up: Your physician recommends that you schedule a follow-up appointment in: with a PA/NP after stress testing.   If you need a refill on your cardiac medications before your next appointment, please call your pharmacy.  Thank you for choosing [facility]!!

## 2015-01-26 NOTE — Telephone Encounter (Signed)
Patient given detailed instructions per Myocardial Perfusion Study Information Sheet for the test on 01/31/15 at 0730. Patient notified to arrive 15 minutes early and that it is imperative to arrive on time for appointment to keep from having the test rescheduled.  If you need to cancel or reschedule your appointment, please call the office within 24 hours of your appointment. Failure to do so may result in a cancellation of your appointment, and a $50 no show fee. Patient verbalized understanding.Nirvi Boehler, Ranae Palms

## 2015-01-26 NOTE — Assessment & Plan Note (Signed)
HPI: Taking atrovastatin  A: HLD  P: LDL not at goal at last check. Repeat Lipid panel today, if remains not at goal may consider addition of zetia.

## 2015-01-27 LAB — LIPID PANEL
Chol/HDL Ratio: 3.3 ratio units (ref 0.0–4.4)
Cholesterol, Total: 107 mg/dL (ref 100–199)
HDL: 32 mg/dL — ABNORMAL LOW (ref >39)
LDL Calculated: 48 mg/dL (ref 0–99)
Triglycerides: 134 mg/dL (ref 0–149)
VLDL Cholesterol Cal: 27 mg/dL (ref 5–40)

## 2015-01-30 NOTE — Progress Notes (Signed)
I saw and evaluated the patient.  I personally confirmed the key portions of Dr. Hoffman's history and exam and reviewed pertinent patient test results.  The assessment, diagnosis, and plan were formulated together and I agree with the documentation in the resident's note. 

## 2015-01-31 ENCOUNTER — Ambulatory Visit (HOSPITAL_COMMUNITY): Payer: Medicare Other | Attending: Cardiology

## 2015-01-31 ENCOUNTER — Encounter: Payer: Self-pay | Admitting: Internal Medicine

## 2015-01-31 DIAGNOSIS — R9439 Abnormal result of other cardiovascular function study: Secondary | ICD-10-CM | POA: Insufficient documentation

## 2015-01-31 DIAGNOSIS — R0609 Other forms of dyspnea: Secondary | ICD-10-CM | POA: Insufficient documentation

## 2015-01-31 DIAGNOSIS — E119 Type 2 diabetes mellitus without complications: Secondary | ICD-10-CM | POA: Insufficient documentation

## 2015-01-31 DIAGNOSIS — R0789 Other chest pain: Secondary | ICD-10-CM | POA: Insufficient documentation

## 2015-01-31 DIAGNOSIS — R002 Palpitations: Secondary | ICD-10-CM | POA: Insufficient documentation

## 2015-01-31 DIAGNOSIS — I10 Essential (primary) hypertension: Secondary | ICD-10-CM | POA: Insufficient documentation

## 2015-01-31 LAB — MYOCARDIAL PERFUSION IMAGING
LV dias vol: 93 mL
LV sys vol: 34 mL
Peak HR: 81 {beats}/min
RATE: 0.33
Rest HR: 66 {beats}/min
SDS: 11
SRS: 5
SSS: 14
TID: 0.94

## 2015-01-31 MED ORDER — TECHNETIUM TC 99M SESTAMIBI GENERIC - CARDIOLITE
32.4000 | Freq: Once | INTRAVENOUS | Status: AC | PRN
  Administered 2015-01-31: 32 via INTRAVENOUS

## 2015-01-31 MED ORDER — TECHNETIUM TC 99M SESTAMIBI GENERIC - CARDIOLITE
10.7000 | Freq: Once | INTRAVENOUS | Status: AC | PRN
  Administered 2015-01-31: 11 via INTRAVENOUS

## 2015-01-31 MED ORDER — REGADENOSON 0.4 MG/5ML IV SOLN
0.4000 mg | Freq: Once | INTRAVENOUS | Status: AC
  Administered 2015-01-31: 0.4 mg via INTRAVENOUS

## 2015-02-02 ENCOUNTER — Encounter: Payer: Self-pay | Admitting: Cardiovascular Disease

## 2015-02-07 ENCOUNTER — Other Ambulatory Visit: Payer: Self-pay | Admitting: Internal Medicine

## 2015-02-07 NOTE — Telephone Encounter (Signed)
Can we please call ms [name] and find out why she is requesting a refill of macrobid?  If the 3 day course of macrobid failed to resolve her symptoms she may need a 7 day course of levofloxacin or bactrim and potentially a urine culture.

## 2015-02-09 ENCOUNTER — Other Ambulatory Visit: Payer: Self-pay | Admitting: Internal Medicine

## 2015-02-09 ENCOUNTER — Ambulatory Visit: Payer: Self-pay | Admitting: Cardiovascular Disease

## 2015-02-09 DIAGNOSIS — E038 Other specified hypothyroidism: Secondary | ICD-10-CM

## 2015-02-13 ENCOUNTER — Encounter (HOSPITAL_COMMUNITY): Payer: Self-pay | Admitting: General Practice

## 2015-02-13 ENCOUNTER — Ambulatory Visit (HOSPITAL_COMMUNITY)
Admission: RE | Admit: 2015-02-13 | Discharge: 2015-02-14 | Disposition: A | Discharge status: Home / Self Care | Payer: Medicare Other | Source: Ambulatory Visit | Attending: Cardiovascular Disease | Admitting: Cardiovascular Disease

## 2015-02-13 ENCOUNTER — Encounter (HOSPITAL_COMMUNITY)
Admission: RE | Disposition: A | Discharge status: Home / Self Care | Payer: Medicare Other | Source: Ambulatory Visit | Attending: Cardiovascular Disease

## 2015-02-13 DIAGNOSIS — E039 Hypothyroidism, unspecified: Secondary | ICD-10-CM | POA: Diagnosis not present

## 2015-02-13 DIAGNOSIS — E119 Type 2 diabetes mellitus without complications: Secondary | ICD-10-CM | POA: Diagnosis not present

## 2015-02-13 DIAGNOSIS — Z23 Encounter for immunization: Secondary | ICD-10-CM | POA: Insufficient documentation

## 2015-02-13 DIAGNOSIS — Z7984 Long term (current) use of oral hypoglycemic drugs: Secondary | ICD-10-CM | POA: Insufficient documentation

## 2015-02-13 DIAGNOSIS — E785 Hyperlipidemia, unspecified: Secondary | ICD-10-CM | POA: Diagnosis not present

## 2015-02-13 DIAGNOSIS — Z87891 Personal history of nicotine dependence: Secondary | ICD-10-CM | POA: Diagnosis not present

## 2015-02-13 DIAGNOSIS — Z86010 Personal history of colonic polyps: Secondary | ICD-10-CM | POA: Insufficient documentation

## 2015-02-13 DIAGNOSIS — Z8249 Family history of ischemic heart disease and other diseases of the circulatory system: Secondary | ICD-10-CM | POA: Insufficient documentation

## 2015-02-13 DIAGNOSIS — Z8673 Personal history of transient ischemic attack (TIA), and cerebral infarction without residual deficits: Secondary | ICD-10-CM | POA: Insufficient documentation

## 2015-02-13 DIAGNOSIS — I701 Atherosclerosis of renal artery: Secondary | ICD-10-CM | POA: Diagnosis not present

## 2015-02-13 DIAGNOSIS — I10 Essential (primary) hypertension: Secondary | ICD-10-CM | POA: Diagnosis not present

## 2015-02-13 DIAGNOSIS — I2510 Atherosclerotic heart disease of native coronary artery without angina pectoris: Secondary | ICD-10-CM | POA: Diagnosis not present

## 2015-02-13 DIAGNOSIS — Z955 Presence of coronary angioplasty implant and graft: Secondary | ICD-10-CM | POA: Diagnosis not present

## 2015-02-13 DIAGNOSIS — F4310 Post-traumatic stress disorder, unspecified: Secondary | ICD-10-CM | POA: Diagnosis not present

## 2015-02-13 DIAGNOSIS — R931 Abnormal findings on diagnostic imaging of heart and coronary circulation: Secondary | ICD-10-CM | POA: Diagnosis present

## 2015-02-13 DIAGNOSIS — I25119 Atherosclerotic heart disease of native coronary artery with unspecified angina pectoris: Secondary | ICD-10-CM | POA: Diagnosis present

## 2015-02-13 DIAGNOSIS — F329 Major depressive disorder, single episode, unspecified: Secondary | ICD-10-CM | POA: Diagnosis not present

## 2015-02-13 DIAGNOSIS — Z7982 Long term (current) use of aspirin: Secondary | ICD-10-CM | POA: Insufficient documentation

## 2015-02-13 DIAGNOSIS — I2582 Chronic total occlusion of coronary artery: Secondary | ICD-10-CM | POA: Insufficient documentation

## 2015-02-13 DIAGNOSIS — K219 Gastro-esophageal reflux disease without esophagitis: Secondary | ICD-10-CM | POA: Insufficient documentation

## 2015-02-13 DIAGNOSIS — I25110 Atherosclerotic heart disease of native coronary artery with unstable angina pectoris: Secondary | ICD-10-CM | POA: Insufficient documentation

## 2015-02-13 DIAGNOSIS — Z7902 Long term (current) use of antithrombotics/antiplatelets: Secondary | ICD-10-CM | POA: Insufficient documentation

## 2015-02-13 HISTORY — PX: PERCUTANEOUS CORONARY STENT INTERVENTION (PCI-S): SHX6016

## 2015-02-13 HISTORY — PX: CARDIAC CATHETERIZATION: SHX172

## 2015-02-13 LAB — CBC
HCT: 36.7 % (ref 36.0–46.0)
Hemoglobin: 11.5 g/dL — ABNORMAL LOW (ref 12.0–15.0)
MCH: 27.1 pg (ref 26.0–34.0)
MCHC: 31.3 g/dL (ref 30.0–36.0)
MCV: 86.4 fL (ref 78.0–100.0)
Platelets: 269 10*3/uL (ref 150–400)
RBC: 4.25 MIL/uL (ref 3.87–5.11)
RDW: 14.2 % (ref 11.5–15.5)
WBC: 6.1 10*3/uL (ref 4.0–10.5)

## 2015-02-13 LAB — BASIC METABOLIC PANEL
Anion gap: 8 (ref 5–15)
BUN: 10 mg/dL (ref 6–20)
CO2: 29 mmol/L (ref 22–32)
Calcium: 9.2 mg/dL (ref 8.9–10.3)
Chloride: 105 mmol/L (ref 101–111)
Creatinine, Ser: 0.8 mg/dL (ref 0.44–1.00)
GFR calc Af Amer: >60 mL/min (ref >60)
GFR calc non Af Amer: >60 mL/min (ref >60)
Glucose, Bld: 163 mg/dL — ABNORMAL HIGH (ref 65–99)
Potassium: 3.7 mmol/L (ref 3.5–5.1)
Sodium: 142 mmol/L (ref 135–145)

## 2015-02-13 LAB — GLUCOSE, CAPILLARY
Glucose-Capillary: 148 mg/dL — ABNORMAL HIGH (ref 65–99)
Glucose-Capillary: 136 mg/dL — ABNORMAL HIGH (ref 65–99)
Glucose-Capillary: 138 mg/dL — ABNORMAL HIGH (ref 65–99)
Glucose-Capillary: 143 mg/dL — ABNORMAL HIGH (ref 65–99)

## 2015-02-13 LAB — POCT ACTIVATED CLOTTING TIME
Activated Clotting Time: 245 seconds
Activated Clotting Time: 270 seconds

## 2015-02-13 LAB — PROTIME-INR
INR: 1.12 (ref 0.00–1.49)
Prothrombin Time: 14.6 seconds (ref 11.6–15.2)

## 2015-02-13 SURGERY — LEFT HEART CATH AND CORONARY ANGIOGRAPHY
Anesthesia: LOCAL | Laterality: Right

## 2015-02-13 MED ORDER — LISINOPRIL-HYDROCHLOROTHIAZIDE 20-12.5 MG PO TABS: 1.0000 | ORAL_TABLET | Freq: Two times a day (BID) | ORAL | Status: DC

## 2015-02-13 MED ORDER — INSULIN ASPART 100 UNIT/ML ~~LOC~~ SOLN
0.0000 [IU] | Freq: Three times a day (TID) | SUBCUTANEOUS | Status: DC
  Administered 2015-02-13 – 2015-02-14 (×2): 2 [IU] via SUBCUTANEOUS

## 2015-02-13 MED ORDER — HYDROCHLOROTHIAZIDE 12.5 MG PO CAPS
12.5000 mg | ORAL_CAPSULE | Freq: Every day | ORAL | Status: DC
  Administered 2015-02-14: 12.5 mg via ORAL
  Filled 2015-02-13: qty 1

## 2015-02-13 MED ORDER — HEPARIN SODIUM (PORCINE) 1000 UNIT/ML IJ SOLN
INTRAMUSCULAR | Status: AC
  Filled 2015-02-13: qty 1

## 2015-02-13 MED ORDER — ASPIRIN 81 MG PO CHEW: 81.0000 mg | CHEWABLE_TABLET | ORAL | Status: DC

## 2015-02-13 MED ORDER — NITROGLYCERIN 1 MG/10 ML FOR IR/CATH LAB
INTRA_ARTERIAL | Status: DC | PRN
  Administered 2015-02-13: 150 ug via INTRACORONARY

## 2015-02-13 MED ORDER — ISOSORBIDE MONONITRATE ER 30 MG PO TB24
30.0000 mg | ORAL_TABLET | Freq: Every day | ORAL | Status: DC
  Administered 2015-02-14: 11:00:00 30 mg via ORAL
  Filled 2015-02-13: qty 1

## 2015-02-13 MED ORDER — AMLODIPINE BESYLATE 10 MG PO TABS
10.0000 mg | ORAL_TABLET | Freq: Every day | ORAL | Status: DC
  Administered 2015-02-14: 11:00:00 10 mg via ORAL
  Filled 2015-02-13: qty 1

## 2015-02-13 MED ORDER — LEVOTHYROXINE SODIUM 125 MCG PO TABS
125.0000 ug | ORAL_TABLET | Freq: Every day | ORAL | Status: DC
  Administered 2015-02-14: 125 ug via ORAL
  Filled 2015-02-13: qty 1

## 2015-02-13 MED ORDER — SODIUM CHLORIDE 0.9 % IJ SOLN: 3.0000 mL | Freq: Two times a day (BID) | INTRAMUSCULAR | Status: DC

## 2015-02-13 MED ORDER — FENTANYL CITRATE (PF) 100 MCG/2ML IJ SOLN
INTRAMUSCULAR | Status: DC | PRN
  Administered 2015-02-13: 50 ug via INTRAVENOUS
  Administered 2015-02-13 (×2): 25 ug via INTRAVENOUS

## 2015-02-13 MED ORDER — HYDRALAZINE HCL 20 MG/ML IJ SOLN
INTRAMUSCULAR | Status: AC
  Filled 2015-02-13: qty 1

## 2015-02-13 MED ORDER — FENTANYL CITRATE (PF) 100 MCG/2ML IJ SOLN
INTRAMUSCULAR | Status: AC
  Filled 2015-02-13: qty 2

## 2015-02-13 MED ORDER — SODIUM CHLORIDE 0.9 % IV SOLN: INTRAVENOUS | Status: DC

## 2015-02-13 MED ORDER — ONDANSETRON HCL 4 MG/2ML IJ SOLN
INTRAMUSCULAR | Status: DC | PRN
  Administered 2015-02-13: 4 mg via INTRAVENOUS

## 2015-02-13 MED ORDER — PROMETHAZINE HCL 25 MG/ML IJ SOLN
6.2500 mg | Freq: Four times a day (QID) | INTRAMUSCULAR | Status: DC | PRN
  Administered 2015-02-13: 19:00:00 12.5 mg via INTRAVENOUS
  Filled 2015-02-13: qty 1

## 2015-02-13 MED ORDER — HEPARIN (PORCINE) IN NACL 2-0.9 UNIT/ML-% IJ SOLN
INTRAMUSCULAR | Status: DC | PRN
  Administered 2015-02-13: 10 mL via INTRA_ARTERIAL

## 2015-02-13 MED ORDER — VERAPAMIL HCL 2.5 MG/ML IV SOLN
INTRAVENOUS | Status: AC
  Filled 2015-02-13: qty 2

## 2015-02-13 MED ORDER — ONDANSETRON HCL 4 MG/2ML IJ SOLN
INTRAMUSCULAR | Status: AC
  Filled 2015-02-13: qty 2

## 2015-02-13 MED ORDER — SODIUM CHLORIDE 0.9 % WEIGHT BASED INFUSION
3.0000 mL/kg/h | INTRAVENOUS | Status: DC
  Administered 2015-02-13 (×2): 3 mL/kg/h via INTRAVENOUS

## 2015-02-13 MED ORDER — SODIUM CHLORIDE 0.9 % IJ SOLN: 3.0000 mL | INTRAMUSCULAR | Status: DC | PRN

## 2015-02-13 MED ORDER — IOHEXOL 350 MG/ML SOLN
INTRAVENOUS | Status: DC | PRN
  Administered 2015-02-13: 200 mL via INTRA_ARTERIAL

## 2015-02-13 MED ORDER — HYDRALAZINE HCL 20 MG/ML IJ SOLN
10.0000 mg | INTRAMUSCULAR | Status: DC | PRN
  Administered 2015-02-13: 17:00:00 10 mg via INTRAVENOUS
  Filled 2015-02-13: qty 1

## 2015-02-13 MED ORDER — LIDOCAINE HCL (PF) 1 % IJ SOLN
INTRAMUSCULAR | Status: AC
  Filled 2015-02-13: qty 30

## 2015-02-13 MED ORDER — ASPIRIN EC 81 MG PO TBEC
81.0000 mg | DELAYED_RELEASE_TABLET | Freq: Every day | ORAL | Status: DC
  Administered 2015-02-14: 81 mg via ORAL
  Filled 2015-02-13: qty 1

## 2015-02-13 MED ORDER — ATORVASTATIN CALCIUM 80 MG PO TABS
80.0000 mg | ORAL_TABLET | Freq: Every day | ORAL | Status: DC
  Administered 2015-02-14: 11:00:00 80 mg via ORAL
  Filled 2015-02-13: qty 1

## 2015-02-13 MED ORDER — HEPARIN (PORCINE) IN NACL 2-0.9 UNIT/ML-% IJ SOLN
INTRAMUSCULAR | Status: AC
  Filled 2015-02-13: qty 1000

## 2015-02-13 MED ORDER — MIDAZOLAM HCL 2 MG/2ML IJ SOLN
INTRAMUSCULAR | Status: DC | PRN
  Administered 2015-02-13: 2 mg via INTRAVENOUS
  Administered 2015-02-13: 1 mg via INTRAVENOUS

## 2015-02-13 MED ORDER — ANGIOPLASTY BOOK
Freq: Once | Status: AC
  Administered 2015-02-14: 07:00:00
  Filled 2015-02-13: qty 1

## 2015-02-13 MED ORDER — HEPARIN SODIUM (PORCINE) 1000 UNIT/ML IJ SOLN
INTRAMUSCULAR | Status: DC | PRN
  Administered 2015-02-13: 2000 [IU] via INTRAVENOUS
  Administered 2015-02-13: 4000 [IU] via INTRAVENOUS
  Administered 2015-02-13: 5000 [IU] via INTRAVENOUS

## 2015-02-13 MED ORDER — CLOPIDOGREL BISULFATE 75 MG PO TABS
75.0000 mg | ORAL_TABLET | Freq: Every day | ORAL | Status: DC
  Administered 2015-02-14: 08:00:00 75 mg via ORAL
  Filled 2015-02-13: qty 1

## 2015-02-13 MED ORDER — NITROGLYCERIN 1 MG/10 ML FOR IR/CATH LAB
INTRA_ARTERIAL | Status: AC
  Filled 2015-02-13: qty 10

## 2015-02-13 MED ORDER — MIDAZOLAM HCL 2 MG/2ML IJ SOLN
INTRAMUSCULAR | Status: AC
  Filled 2015-02-13: qty 2

## 2015-02-13 MED ORDER — SODIUM CHLORIDE 0.9 % IV SOLN: 250.0000 mL | INTRAVENOUS | Status: DC | PRN

## 2015-02-13 MED ORDER — SODIUM CHLORIDE 0.9 % WEIGHT BASED INFUSION: 1.0000 mL/kg/h | INTRAVENOUS | Status: DC

## 2015-02-13 MED ORDER — LISINOPRIL 10 MG PO TABS
20.0000 mg | ORAL_TABLET | Freq: Every day | ORAL | Status: DC
  Administered 2015-02-14: 11:00:00 20 mg via ORAL
  Filled 2015-02-13: qty 2

## 2015-02-13 MED ORDER — ACETAMINOPHEN 325 MG PO TABS: 650.0000 mg | ORAL_TABLET | ORAL | Status: DC | PRN

## 2015-02-13 MED ORDER — HYDRALAZINE HCL 20 MG/ML IJ SOLN
INTRAMUSCULAR | Status: DC | PRN
  Administered 2015-02-13: 10 mg via INTRAVENOUS

## 2015-02-13 MED ORDER — OXYCODONE-ACETAMINOPHEN 5-325 MG PO TABS: 1.0000 | ORAL_TABLET | ORAL | Status: DC | PRN

## 2015-02-13 MED ORDER — SODIUM CHLORIDE 0.9 % IJ SOLN
3.0000 mL | Freq: Two times a day (BID) | INTRAMUSCULAR | Status: DC
  Administered 2015-02-13: 3 mL via INTRAVENOUS

## 2015-02-13 MED ORDER — INFLUENZA VAC SPLIT QUAD 0.5 ML IM SUSY
0.5000 mL | PREFILLED_SYRINGE | INTRAMUSCULAR | Status: AC
  Administered 2015-02-14: 0.5 mL via INTRAMUSCULAR
  Filled 2015-02-13: qty 0.5

## 2015-02-13 MED ORDER — ONDANSETRON HCL 4 MG/2ML IJ SOLN: 4.0000 mg | Freq: Four times a day (QID) | INTRAMUSCULAR | Status: DC | PRN

## 2015-02-13 MED ORDER — CARVEDILOL 12.5 MG PO TABS
12.5000 mg | ORAL_TABLET | Freq: Two times a day (BID) | ORAL | Status: DC
  Administered 2015-02-13 – 2015-02-14 (×2): 12.5 mg via ORAL
  Filled 2015-02-13 (×2): qty 1

## 2015-02-13 SURGICAL SUPPLY — 20 items
BALLN EMERGE MR 2.5X12 (BALLOONS) ×3
BALLN ~~LOC~~ EMERGE MR 3.0X8 (BALLOONS) ×3
BALLOON EMERGE MR 2.5X12 (BALLOONS) ×2 IMPLANT
BALLOON ~~LOC~~ EMERGE MR 3.0X8 (BALLOONS) ×2 IMPLANT
CATH INFINITI 5 FR JL3.5 (CATHETERS) ×3 IMPLANT
CATH INFINITI 5FR ANG PIGTAIL (CATHETERS) ×3 IMPLANT
CATH INFINITI JR4 5F (CATHETERS) ×3 IMPLANT
CATH VISTA GUIDE 6FR XB3.5 (CATHETERS) ×3 IMPLANT
DEVICE RAD COMP TR BAND LRG (VASCULAR PRODUCTS) ×3 IMPLANT
GLIDESHEATH SLEND SS 6F .021 (SHEATH) ×3 IMPLANT
KIT ENCORE 26 ADVANTAGE (KITS) ×3 IMPLANT
KIT HEART LEFT (KITS) ×3 IMPLANT
PACK CARDIAC CATHETERIZATION (CUSTOM PROCEDURE TRAY) ×3 IMPLANT
STENT SYNERGY DES 2.75X12 (Permanent Stent) ×3 IMPLANT
SYR MEDRAD MARK V 150ML (SYRINGE) ×3 IMPLANT
TRANSDUCER W/STOPCOCK (MISCELLANEOUS) ×3 IMPLANT
TUBING CIL FLEX 10 FLL-RA (TUBING) ×3 IMPLANT
VALVE GUARDIAN II ~~LOC~~ HEMO (MISCELLANEOUS) ×3 IMPLANT
WIRE HI TORQ WHISPER MS 190CM (WIRE) ×3 IMPLANT
WIRE SAFE-T 1.5MM-J .035X260CM (WIRE) ×3 IMPLANT

## 2015-02-13 NOTE — Progress Notes (Signed)
TR BAND REMOVAL  LOCATION:    right radial  DEFLATED PER PROTOCOL:    Yes.    TIME BAND OFF / DRESSING APPLIED:    1900   SITE UPON ARRIVAL:    Level 0  SITE AFTER BAND REMOVAL:    Level 0  CIRCULATION SENSATION AND MOVEMENT:    Within Normal Limits   Yes.    COMMENTS:   Tolerated procedure well, post [place]  Instruction given.

## 2015-02-13 NOTE — Interval H&P Note (Signed)
Cath Lab Visit (complete for each Cath Lab visit)  Clinical Evaluation Leading to the Procedure:   ACS: No.  Non-ACS:    Anginal Classification: CCS II  Anti-ischemic medical therapy: Maximal Therapy (2 or more classes of medications)  Non-Invasive Test Results: Intermediate-risk stress test findings: cardiac mortality 1-3%/year  Prior CABG: No previous CABG      History and Physical Interval Note:  02/13/2015 1:36 PM  [name]  has presented today for surgery, with the diagnosis of c/p abn myoview  The various methods of treatment have been discussed with the patient and family. After consideration of risks, benefits and other options for treatment, the patient has consented to  Procedure(s): Left Heart Cath and Coronary Angiography (N/A) as a surgical intervention .  The patient's history has been reviewed, patient examined, no change in status, stable for surgery.  I have reviewed the patient's chart and labs.  Questions were answered to the patient's satisfaction.     Sherren Mocha

## 2015-02-13 NOTE — H&P (View-Only) (Signed)
Cardiology Office Note   Date:  01/26/2015   ID:  [name], DOB 05-Jan-1946, MRN [id number]  PCP:  Lucious Groves, DO  Cardiologist:  Burt Knack Primary Electrophysiologist:  Constance Haw, MD    Chief Complaint  Patient presents with  . Follow-up  . Shortness of Breath  . chest discomfort     History of Present Illness: [name] is a 70 y.o. female who presents today for cardiology evaluation.   She has a history of coronary artery disease status post PCI of the circumflex and a chronically occluded RCA from 2004 with, renal artery stenosis status post right renal artery stenting in 2007, hypertension, type 2 diabetes, hyperlipidemia, and GERD. She did not have an an MI but did undergo cardiac catheterization which showed  Ost RCA to Prox RCA lesion, 100% stenosed.  1st Mrg lesion, 30% stenosed. The lesion was previously treated with a stent (unknown type) .  The left ventricular systolic function is normal.  Ost 1st Mrg to 1st Mrg lesion, 80% stenosed. There is a 0% residual stenosis post intervention--PCI stenting of her proximal OM1 with a Synergy 2.5 mm x 12 mm long stent at 14 atm (2.70 mm) after predilatation with a 2 mm balloon 07/18/14 She's been having intermittent chest pain in the center for chest and doesn't radiate. She says that the pain is worse with exertion and emotional stress but improves with rest. She has been taking nitroglycerin which also improves the chest pain.  Today, she denies symptoms of palpitations, orthopnea, PND, lower extremity edema, claudication, dizziness, presyncope, syncope, bleeding, or neurologic sequela. The patient is tolerating medications without difficulties and is otherwise without complaint today.    Past Medical History  Diagnosis Date  . Cervical polyp   . Neck mass   . Cervical lymphadenopathy   . Urinary tract infection   . Hyperlipidemia     takes Crestor daily  . Coronary artery disease     Prior stent of the  lCX with chronic total occlusion of a small RCA. Last cath in 2004 showing patent stent, nonobstructive disease and total RCA. EF is 65 to 70%. She has been managed medically.   . Tubular adenoma of rectum 10/24/04  . Diabetes mellitus without complication ([place])     takes Metformin daily  . Hypothyroidism     takes SYnthroid daily  . Hypertension     takes Amlodipine,Coreg,and Prinzide daily  . TIA (transient ischemic attack)   . Back pain     reason unknown  . History of colon polyps   . Depression     sees a Social worker at [facility] but no meds  . Umbilical hernia   . OSA (obstructive sleep apnea)     PT DENIES, STATES SHE WAS TOLD SHE DIDN'T HAVE OSA, NO CPAP  . PTSD (post-traumatic stress disorder)   . Renal artery stenosis ([place]) 2007  . Stroke ([place])     TIA- LAST ONE GREATER THAN 10 YEARS PER PT  . CAD (coronary artery disease)    Past Surgical History  Procedure Laterality Date  . Cardiac catheterization  2005/2006/2007/2010  . Colonoscopy  10/2004  . Appendectomy    . Ankle surgery Left     plates and screws  . Coronary angioplasty      x 1  . Umbilical hernia repair    . Umbilical hernia repair N/A 05/27/2013    Procedure: HERNIA REPAIR UMBILICAL ADULT;  Surgeon: Imogene Burn. Georgette Dover, MD;  Location:  [place] [facility];  Service: General;  Laterality: N/A;  . Insertion of mesh N/A 05/27/2013    Procedure: INSERTION OF MESH;  Surgeon: Imogene Burn. Georgette Dover, MD;  Location: [place];  Service: General;  Laterality: N/A;  . Cardiac catheterization N/A 07/18/2014    Procedure: Left Heart Cath and Coronary Angiography;  Surgeon: Lorretta Harp, MD; LAD patent, RCA 100% with L-R collaterals, OM1 80% stenosed proximal to previously placed stent, 30% ISR, EF 55%  . Renal artery stent Bilateral 2007    Dr. Albertine Patricia  . Coronary stent placement  1998    NIR 3.0 x 25 to the OM1  . Coronary stent placement  07/18/2014    SYNERGY DES 2.5X12 to the OM1      Current Outpatient Prescriptions  Medication  Sig Dispense Refill  . amLODipine (NORVASC) 10 MG tablet Take 1 tablet (10 mg total) by mouth daily. 90 tablet 0  . aspirin 81 MG tablet Take 1 tablet (81 mg total) by mouth daily.    Marland Kitchen atorvastatin (LIPITOR) 80 MG tablet Take 1 tablet (80 mg total) by mouth daily. 30 tablet 11  . carvedilol (COREG) 25 MG tablet TAKE ONE-HALF TABLET BY MOUTH TWICE DAILY 30 tablet 10  . clopidogrel (PLAVIX) 75 MG tablet Take 1 tablet (75 mg total) by mouth daily with breakfast. 30 tablet 11  . docusate sodium (COLACE) 100 MG capsule Take 1 capsule (100 mg total) by mouth every 12 (twelve) hours. 30 capsule 0  . levothyroxine (SYNTHROID, LEVOTHROID) 125 MCG tablet TAKE ONE TABLET BY MOUTH ONCE DAILY BEFORE BREAKFAST 30 tablet 3  . lisinopril-hydrochlorothiazide (PRINZIDE,ZESTORETIC) 20-12.5 MG per tablet Take 1 tablet by mouth 2 (two) times daily. 60 tablet 11  . metFORMIN (GLUCOPHAGE XR) 500 MG 24 hr tablet Take 1 tablet (500 mg total) by mouth daily with breakfast. 30 tablet 11  . nitroGLYCERIN (NITROSTAT) 0.4 MG SL tablet Place 0.4 mg under the tongue every 5 (five) minutes as needed for chest pain. Up to 3 doses     No current facility-administered medications for this visit.    Allergies:   Review of patient's allergies indicates no known allergies.   Social History:  The patient  reports that she has quit smoking. She has never used smokeless tobacco. She reports that she drinks alcohol. She reports that she does not use illicit drugs.   Family History:  The patient's  family history includes Alzheimer's disease in her father; Hypertension in her mother; Osteoarthritis in her mother. There is no history of Cancer, Stroke, Colon cancer, Esophageal cancer, Stomach cancer, or Rectal cancer.    ROS:  Please see the history of present illness.   Otherwise, review of systems is positive for chest pain, wheezing, shortness of breath.   All other systems are reviewed and negative.    PHYSICAL EXAM: VS:  Ht 5'  4.5" (1.638 m)  Wt 212 lb (96.163 kg)  BMI 35.84 kg/m2 , BMI Body mass index is 35.84 kg/(m^2). GEN: Well nourished, well developed, in no acute distress HEENT: normal Neck: no JVD, carotid bruits, or masses Cardiac: RRR; no murmurs, rubs, or gallops,no edema  Respiratory:  clear to auscultation bilaterally, normal work of breathing GI: soft, nontender, nondistended, + BS MS: no deformity or atrophy Skin: warm and dry Neuro:  Strength and sensation are intact Psych: euthymic mood, full affect  EKG:  EKG is ordered today. The ekg ordered today shows sinus rhythm, lateral TWI (old)  Recent Labs: 07/07/2014: TSH 3.682 07/18/2014:  Magnesium 2.0 07/19/2014: Hemoglobin 11.3*; Platelets 285 07/26/2014: BUN 13; Creat 0.88; Potassium 3.8; Sodium 142 08/25/2014: ALT 13    Lipid Panel     Component Value Date/Time   CHOL 202* 08/25/2014 0741   TRIG 134.0 08/25/2014 0741   HDL 33.20* 08/25/2014 0741   CHOLHDL 6 08/25/2014 0741   VLDL 26.8 08/25/2014 0741   LDLCALC 142* 08/25/2014 0741   LDLDIRECT 196.8 07/11/2010 1107     Wt Readings from Last 3 Encounters:  01/26/15 212 lb (96.163 kg)  08/11/14 211 lb 12.8 oz (96.072 kg)  07/26/14 214 lb 4.8 oz (97.206 kg)      Other studies Reviewed: Additional studies/ records that were reviewed today include: Cath 07/18/14 Review of the above records today demonstrates:   Ost RCA to Prox RCA lesion, 100% stenosed.  1st Mrg lesion, 30% stenosed. The lesion was previously treated with a stent (unknown type) .  The left ventricular systolic function is normal.  Ost 1st Mrg to 1st Mrg lesion, 80% stenosed. There is a 0% residual stenosis post intervention.  ASSESSMENT AND PLAN:  1.  Chest pain: Patient complains of chest pain that is worse with exertion but improved with rest also worse with emotional stressors. She has been taking nitroglycerin for her pain which seems to improve the discomfort. Because she is having more pain I have  prescribed her indoor which should help with the amount of pain she has. I have also scheduled her for a nuclear stress test to determine if she has any further ischemia. She has had interventions in the past.    Current medicines are reviewed at length with the patient today.   The patient does not have concerns regarding her medicines.  The following changes were made today:  imdur  Labs/ tests ordered today include:  No orders of the defined types were placed in this encounter.     Disposition:   FU with Cecilie Kicks post stress testing  Signed, Janira Mandell Meredith Leeds, MD  01/26/2015 10:28 AM     [place] [address] [phone] (office) [phone] (fax)

## 2015-02-14 ENCOUNTER — Encounter (HOSPITAL_COMMUNITY): Payer: Self-pay | Admitting: Cardiovascular Disease

## 2015-02-14 ENCOUNTER — Ambulatory Visit: Payer: Self-pay

## 2015-02-14 ENCOUNTER — Ambulatory Visit: Payer: Self-pay | Admitting: Cardiology

## 2015-02-14 DIAGNOSIS — Z86010 Personal history of colonic polyps: Secondary | ICD-10-CM | POA: Diagnosis not present

## 2015-02-14 DIAGNOSIS — F329 Major depressive disorder, single episode, unspecified: Secondary | ICD-10-CM | POA: Diagnosis not present

## 2015-02-14 DIAGNOSIS — Z7984 Long term (current) use of oral hypoglycemic drugs: Secondary | ICD-10-CM | POA: Diagnosis not present

## 2015-02-14 DIAGNOSIS — Z7902 Long term (current) use of antithrombotics/antiplatelets: Secondary | ICD-10-CM | POA: Diagnosis not present

## 2015-02-14 DIAGNOSIS — E039 Hypothyroidism, unspecified: Secondary | ICD-10-CM | POA: Diagnosis not present

## 2015-02-14 DIAGNOSIS — Z955 Presence of coronary angioplasty implant and graft: Secondary | ICD-10-CM | POA: Diagnosis not present

## 2015-02-14 DIAGNOSIS — Z8249 Family history of ischemic heart disease and other diseases of the circulatory system: Secondary | ICD-10-CM | POA: Diagnosis not present

## 2015-02-14 DIAGNOSIS — I25110 Atherosclerotic heart disease of native coronary artery with unstable angina pectoris: Secondary | ICD-10-CM | POA: Diagnosis not present

## 2015-02-14 DIAGNOSIS — E119 Type 2 diabetes mellitus without complications: Secondary | ICD-10-CM | POA: Diagnosis not present

## 2015-02-14 DIAGNOSIS — I10 Essential (primary) hypertension: Secondary | ICD-10-CM | POA: Diagnosis not present

## 2015-02-14 DIAGNOSIS — I25118 Atherosclerotic heart disease of native coronary artery with other forms of angina pectoris: Secondary | ICD-10-CM

## 2015-02-14 DIAGNOSIS — I2582 Chronic total occlusion of coronary artery: Secondary | ICD-10-CM | POA: Diagnosis not present

## 2015-02-14 DIAGNOSIS — E785 Hyperlipidemia, unspecified: Secondary | ICD-10-CM | POA: Diagnosis not present

## 2015-02-14 DIAGNOSIS — I701 Atherosclerosis of renal artery: Secondary | ICD-10-CM | POA: Diagnosis not present

## 2015-02-14 DIAGNOSIS — Z87891 Personal history of nicotine dependence: Secondary | ICD-10-CM | POA: Diagnosis not present

## 2015-02-14 DIAGNOSIS — F4310 Post-traumatic stress disorder, unspecified: Secondary | ICD-10-CM | POA: Diagnosis not present

## 2015-02-14 DIAGNOSIS — Z23 Encounter for immunization: Secondary | ICD-10-CM | POA: Diagnosis not present

## 2015-02-14 DIAGNOSIS — K219 Gastro-esophageal reflux disease without esophagitis: Secondary | ICD-10-CM | POA: Diagnosis not present

## 2015-02-14 DIAGNOSIS — Z7982 Long term (current) use of aspirin: Secondary | ICD-10-CM | POA: Diagnosis not present

## 2015-02-14 DIAGNOSIS — Z8673 Personal history of transient ischemic attack (TIA), and cerebral infarction without residual deficits: Secondary | ICD-10-CM | POA: Diagnosis not present

## 2015-02-14 LAB — BASIC METABOLIC PANEL
Anion gap: 10 (ref 5–15)
BUN: 14 mg/dL (ref 6–20)
CO2: 26 mmol/L (ref 22–32)
Calcium: 8.9 mg/dL (ref 8.9–10.3)
Chloride: 103 mmol/L (ref 101–111)
Creatinine, Ser: 1.13 mg/dL — ABNORMAL HIGH (ref 0.44–1.00)
GFR calc Af Amer: 57 mL/min — ABNORMAL LOW (ref >60)
GFR calc non Af Amer: 49 mL/min — ABNORMAL LOW (ref >60)
Glucose, Bld: 243 mg/dL — ABNORMAL HIGH (ref 65–99)
Potassium: 3.4 mmol/L — ABNORMAL LOW (ref 3.5–5.1)
Sodium: 139 mmol/L (ref 135–145)

## 2015-02-14 LAB — CBC
HCT: 37.2 % (ref 36.0–46.0)
Hemoglobin: 11.9 g/dL — ABNORMAL LOW (ref 12.0–15.0)
MCH: 27.7 pg (ref 26.0–34.0)
MCHC: 32 g/dL (ref 30.0–36.0)
MCV: 86.5 fL (ref 78.0–100.0)
Platelets: 284 10*3/uL (ref 150–400)
RBC: 4.3 MIL/uL (ref 3.87–5.11)
RDW: 14.5 % (ref 11.5–15.5)
WBC: 9.2 10*3/uL (ref 4.0–10.5)

## 2015-02-14 LAB — GLUCOSE, CAPILLARY: Glucose-Capillary: 147 mg/dL — ABNORMAL HIGH (ref 65–99)

## 2015-02-14 MED ORDER — NITROGLYCERIN 0.4 MG SL SUBL: 0.4000 mg | SUBLINGUAL_TABLET | SUBLINGUAL | Status: DC | PRN

## 2015-02-14 MED ORDER — METFORMIN HCL ER 500 MG PO TB24: 500.0000 mg | ORAL_TABLET | Freq: Every day | ORAL | Status: DC

## 2015-02-14 MED ORDER — POTASSIUM CHLORIDE CRYS ER 20 MEQ PO TBCR
40.0000 meq | EXTENDED_RELEASE_TABLET | Freq: Once | ORAL | Status: AC
  Administered 2015-02-14: 11:00:00 40 meq via ORAL
  Filled 2015-02-14: qty 2

## 2015-02-14 NOTE — Discharge Summary (Signed)
Physician Discharge Summary  [name] MRN: [id number] DOB/AGE: September 17, 1945 70 y.o.   Primary Cardiology: Dr. Burt Knack  Admit date: 02/13/2015 Discharge date: 02/14/2015  Admission Diagnoses: Unstable Angina/ Abnormal NST  Discharge Diagnoses:  Active Problems:   Abnormal nuclear cardiac imaging test   Discharged Condition: stable  Hospital Course: 70 yo female with diabetes and known CAD, underwent PCI in June 2016, admitted for elective LHC in the setting of recurrent angina and an anormal nuclear stress test. LHC was performed by Dr. Burt Knack. 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 She underwent successful PCI + DES to the prox LCx. She tolerated the procedure well and left the cath lab in stable condition. She had no post cath complications and no recurrent CP. She was continued on DAPT with ASA + Plavix. She was also continued on high dose statin, BB, ACE-I and nitrate. Her metformin was held post cath. Renal function, vital signs and cath access site remained stable. She had no difficulties ambulating w/ cardiac rehab. She was last seen and examined by Dr. Lorenza Cambridge, who determined she was stable for discharge home. Post hospital f/u will be arranged with Dr. Burt Knack or an APP in 1-2 weeks. She was instructed to resume metformin in 48 hrs.    Consults: None  Significant Diagnostic Studies:  Procedures    Coronary Stent Intervention   Left Heart Cath and Coronary Angiography    Conclusion    1. 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 Normal LV function by noninvasive assessment  DAPT with ASA and plavix at least 12 months. Continued aggressive medical Rx.     Treatments: See Hospital Course  Discharge Exam: Blood  pressure 136/65, pulse 77, temperature 98.2 F (36.8 C), temperature source Oral, resp. rate 25, height 5' 4.5" (1.638 m), weight 201 lb 15.1 oz (91.6 kg), SpO2 98 %.   Disposition: 01-Home or Self Care      Discharge Instructions    Amb Referral to Cardiac Rehabilitation    Complete by:  As directed   Diagnosis:  PCI     Diet - low sodium heart healthy    Complete by:  As directed      Increase activity slowly    Complete by:  As directed             Medication List    TAKE these medications        amLODipine 10 MG tablet  Commonly known as:  NORVASC  Take 1 tablet (10 mg total) by mouth daily.     aspirin 81 MG tablet  Take 1 tablet (81 mg total) by mouth daily.     atorvastatin 80 MG tablet  Commonly known as:  LIPITOR  Take 1 tablet (80 mg total) by mouth daily.     carvedilol 25 MG tablet  Commonly known as:  COREG  TAKE ONE-HALF TABLET BY MOUTH TWICE DAILY     clopidogrel 75 MG tablet  Commonly known as:  PLAVIX  Take 1 tablet (75 mg total) by mouth daily with breakfast.     isosorbide mononitrate 30 MG 24 hr tablet  Commonly known as:  IMDUR  Take 1 tablet (30 mg total) by mouth daily.     levothyroxine 125  MCG tablet  Commonly known as:  SYNTHROID, LEVOTHROID  Take 1 tablet (125 mcg total) by mouth daily before breakfast.     lisinopril-hydrochlorothiazide 20-12.5 MG tablet  Commonly known as:  PRINZIDE,ZESTORETIC  Take 1 tablet by mouth 2 (two) times daily.     metFORMIN 500 MG 24 hr tablet  Commonly known as:  GLUCOPHAGE XR  Take 1 tablet (500 mg total) by mouth daily with breakfast.  Start taking on:  02/16/2015     nitroGLYCERIN 0.4 MG SL tablet  Commonly known as:  NITROSTAT  Place 1 tablet (0.4 mg total) under the tongue every 5 (five) minutes as needed for chest pain. Up to 3 doses       Follow-up Information    Follow up with Sherren Mocha, MD.   Specialty:  Cardiology   Why:  our office will call you with a hospital follow-up  appointment.   Contact information:   [id number] [place] [address] [phone]       TIME SPENT ON DISCHARGE, INCLUDING PHYSICIAN TIME: > 30 MINUTES  Signed: SIMMONS, BRITTAINY 02/14/2015, 9:41 AM

## 2015-02-14 NOTE — Progress Notes (Signed)
CARDIAC REHAB PHASE I   PRE:  Rate/Rhythm: 79 SR  BP:  Sitting: 150/65        SaO2: 99 RA  MODE:  Ambulation: 500 ft   POST:  Rate/Rhythm: 108 ST  BP:  Sitting: 145/94         SaO2: 98 RA  Pt ambulated 500 ft on RA, handheld assist, steady gait, tolerated well.  Pt denies cp, dizziness, DOE, declined rest stop. Completed PCI/stent education.  Reviewed risk factors, anti-platelet therapy, stent card, activity restrictions, ntg, exercise, heart healthy diet, carb counting, portion control and phase 2 cardiac rehab. Pt verbalized understanding, receptive to education, needs reinforcement of diet, states she has had difficulty making changes to her diet since her last hospital visit. Pt agrees to phase 2 cardiac rehab referral, will send to [facility]. Pt recliner after walk, call bell within reach.    [id number] [name], RN, BSN 02/14/2015 9:05 AM

## 2015-02-14 NOTE — Progress Notes (Signed)
Patient Profile: 70 yo female with diabetes and known CAD, last underwent PCI in June 2016, now with recurrent angina and abnormal nuclear stress test. Admitted for elective LHC.   Subjective: No complaints. Denies recurrent CP. No dyspnea. Ambulating ok. Radial cath site is ok.   Objective: Vital signs in last 24 hours: Temp:  [98.2 F (36.8 C)-98.4 F (36.9 C)] 98.2 F (36.8 C) (01/10 0819) Pulse Rate:  [0-98] 77 (01/10 0819) Resp:  [0-34] 25 (01/10 0318) BP: (102-267)/(51-192) 136/65 mmHg (01/10 0318) SpO2:  [0 %-100 %] 98 % (01/10 0819) Weight:  [200 lb (90.719 kg)-201 lb 15.1 oz (91.6 kg)] 201 lb 15.1 oz (91.6 kg) (01/10 0318) Last BM Date: 02/12/15  Intake/Output from previous day: 01/09 0701 - 01/10 0700 In: 640 [P.O.:240; I.V.:400] Out: 1450 [Urine:1450] Intake/Output this shift: Total I/O In: 360 [P.O.:360] Out: -   Medications Current Facility-Administered Medications  Medication Dose Route Frequency Provider Last Rate Last Dose  . 0.9 %  sodium chloride infusion  250 mL Intravenous PRN Sherren Mocha, MD      . acetaminophen (TYLENOL) tablet 650 mg  650 mg Oral Q4H PRN Sherren Mocha, MD      . amLODipine (NORVASC) tablet 10 mg  10 mg Oral Daily Sherren Mocha, MD      . aspirin EC tablet 81 mg  81 mg Oral Daily Sherren Mocha, MD      . atorvastatin (LIPITOR) tablet 80 mg  80 mg Oral Daily Sherren Mocha, MD      . carvedilol (COREG) tablet 12.5 mg  12.5 mg Oral BID Sherren Mocha, MD   12.5 mg at 02/13/15 2200  . clopidogrel (PLAVIX) tablet 75 mg  75 mg Oral Q breakfast Sherren Mocha, MD   75 mg at 02/14/15 0753  . hydrALAZINE (APRESOLINE) injection 10 mg  10 mg Intravenous Q4H PRN Sherren Mocha, MD   10 mg at 02/13/15 1640  . lisinopril (PRINIVIL,ZESTRIL) tablet 20 mg  20 mg Oral Daily Sherren Mocha, MD       And  . hydrochlorothiazide (MICROZIDE) capsule 12.5 mg  12.5 mg Oral Daily Sherren Mocha, MD      . Influenza vac split quadrivalent PF  (FLUARIX) injection 0.5 mL  0.5 mL Intramuscular Tomorrow-1000 Sherren Mocha, MD      . insulin aspart (novoLOG) injection 0-15 Units  0-15 Units Subcutaneous TID [facility] Sherren Mocha, MD   2 Units at 02/14/15 0730  . isosorbide mononitrate (IMDUR) 24 hr tablet 30 mg  30 mg Oral Daily Sherren Mocha, MD      . levothyroxine (SYNTHROID, LEVOTHROID) tablet 125 mcg  125 mcg Oral QAC breakfast Sherren Mocha, MD   125 mcg at 02/14/15 [phone]  . ondansetron (ZOFRAN) injection 4 mg  4 mg Intravenous Q6H PRN Sherren Mocha, MD      . oxyCODONE-acetaminophen (PERCOCET/ROXICET) 5-325 MG per tablet 1-2 tablet  1-2 tablet Oral Q4H PRN Sherren Mocha, MD      . promethazine (PHENERGAN) injection 6.25-12.5 mg  6.25-12.5 mg Intravenous Q6H PRN Evelene Croon Barrett, PA-C   12.5 mg at 02/13/15 1839  . sodium chloride 0.9 % injection 3 mL  3 mL Intravenous Q12H Sherren Mocha, MD   3 mL at 02/13/15 1841  . sodium chloride 0.9 % injection 3 mL  3 mL Intravenous PRN Sherren Mocha, MD        PE: General appearance: alert, cooperative and no distress Neck: no carotid bruit and no JVD Lungs: clear to auscultation  bilaterally Heart: regular rate and rhythm, S1, S2 normal, no murmur, click, rub or gallop Extremities: no LEE Pulses: 2+ and symmetric Skin: warm and dry Neurologic: Grossly normal  Lab Results:   Recent Labs  02/13/15 1034 02/14/15 0317  WBC 6.1 9.2  HGB 11.5* 11.9*  HCT 36.7 37.2  PLT 269 284   BMET  Recent Labs  02/13/15 1034 02/14/15 0317  NA 142 139  K 3.7 3.4*  CL 105 103  CO2 29 26  GLUCOSE 163* 243*  BUN 10 14  CREATININE 0.80 1.13*  CALCIUM 9.2 8.9   PT/INR  Recent Labs  02/13/15 1034  LABPROT 14.6  INR 1.12    Studies/Results: Procedures    Coronary Stent Intervention   Left Heart Cath and Coronary Angiography    Conclusion    1. 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  Normal LV function by noninvasive assessment  DAPT with ASA and plavix at least 12 months. Continued aggressive medical Rx.      Assessment/Plan  Active Problems:   Abnormal nuclear cardiac imaging test    1. Unstable Angina/CAD: Abnormal NST 01/31/15. 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 S/p PCI + DES to the prox LCx. Denies any recurrent CP. No dyspnea. Rt radial cath site is stable with 2+ radial pulse. Continue DAPT with ASA + Plavix, high dose statin, BB, ACE-I and nitrate. Ambulate w/ CR prior to discharge.   2. DM: followed by PCP. Renal function is ok. Hold metformin for 48 hrs post cath.    Dispo: d/c home today. F/u with Dr. Burt Knack or APP in 1-2 weeks.   Brittainy M. Rosita Fire, PA-C 02/14/2015 8:36 AM  I have seen and examined the patient along with Brittainy M. Rosita Fire, PA-C.  I have reviewed the chart, notes and new data.  I agree with PA's note.  Key new complaints: no angina and no bleeding at right radial access site Key examination changes: no signs of CHF, healthy radial artery access Key new findings / data: K 3.4  PLAN: DC home. Cardiac rehab. Discussed critical importance of compliance with DAPT Resume metformin in 48h.  Sanda Klein, MD, [place] [phone] 02/14/2015, 9:03 AM

## 2015-02-15 MED FILL — Lidocaine HCl Local Preservative Free (PF) Inj 1%: INTRAMUSCULAR | Qty: 30 | Status: AC

## 2015-02-15 MED FILL — Heparin Sodium (Porcine) 2 Unit/ML in Sodium Chloride 0.9%: INTRAMUSCULAR | Qty: 500 | Status: AC

## 2015-02-21 ENCOUNTER — Telehealth: Payer: Self-pay | Admitting: *Deleted

## 2015-02-21 NOTE — Telephone Encounter (Signed)
CALLED PATIENT WITH APPOINTMENT TO [facility]/ January 24.017 @ 10:50AM / ARRIVE 15 MINUTES EARLY.  APPOINTMENT ALSO MAILED TO THE PATIENT AS A REMINDER.

## 2015-02-28 ENCOUNTER — Ambulatory Visit
Admission: RE | Admit: 2015-02-28 | Discharge: 2015-02-28 | Disposition: A | Discharge status: Home / Self Care | Payer: Medicare Other | Source: Ambulatory Visit | Attending: Internal Medicine | Admitting: Internal Medicine

## 2015-02-28 DIAGNOSIS — Z1231 Encounter for screening mammogram for malignant neoplasm of breast: Secondary | ICD-10-CM

## 2015-02-28 DIAGNOSIS — L84 Corns and callosities: Secondary | ICD-10-CM | POA: Diagnosis not present

## 2015-02-28 NOTE — Progress Notes (Signed)
Cardiology Office Note   Date:  03/01/2015   ID:  [name], DOB 1945/03/23, MRN [id number]  PCP:  Lucious Groves, DO  Cardiologist:  Dr. Burt Knack    Chief Complaint  Patient presents with  . Hospitalization Follow-up    post stent, pt has no complaints      History of Present Illness: [name] is a 70 y.o. female who presents for post hospital.  Recent cath with new DES to LCX.   She has a hx,of diabetes and known CAD, underwent PCI in June 2016, admitted for elective LHC in the setting of recurrent angina and an anormal nuclear stress test. LHC was performed by Dr. Burt Knack. 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 She underwent successful PCI + DES to the prox LCx.  She was continued on DAPT with ASA + Plavix. She was also continued on high dose statin, BB, ACE-I and nitrate. Her metformin was held post cath but has since resumed. K+ post cath was decreased will K+ 3.4 will recheck.  She has done well without chest pain or SOB.  Unable to go to cardiac rehab but is exercising with silver sneakers.  She eats a healthy diet and her glucose has been controlled.    She denies any chest pain, no SOB. No complaints. Though does have chronic cough, and is on ACE.  She feels she has post nasal drip.     Past Medical History  Diagnosis Date  . Cervical polyp   . Neck mass   . Cervical lymphadenopathy   . Urinary tract infection   . Hyperlipidemia     takes Crestor daily  . Coronary artery disease     Prior stent of the lCX with chronic total occlusion of a small RCA. Last cath in 2004 showing patent stent, nonobstructive disease and total RCA. EF is 65 to 70%. She has been managed medically.   . Tubular adenoma of rectum 10/24/04  . Diabetes mellitus without complication ([place])     takes Metformin daily  . Hypothyroidism    takes SYnthroid daily  . Hypertension     takes Amlodipine,Coreg,and Prinzide daily  . TIA (transient ischemic attack)   . Back pain     reason unknown  . History of colon polyps   . Depression     sees a Social worker at [facility] but no meds  . Umbilical hernia   . OSA (obstructive sleep apnea)     PT DENIES, STATES SHE WAS TOLD SHE DIDN'T HAVE OSA, NO CPAP  . PTSD (post-traumatic stress disorder)   . Renal artery stenosis ([place]) 2007  . Stroke ([place])     TIA- LAST ONE GREATER THAN 10 YEARS PER PT  . CAD (coronary artery disease)     Past Surgical History  Procedure Laterality Date  . Cardiac catheterization  2005/2006/2007/2010  . Colonoscopy  10/2004  . Appendectomy    . Ankle surgery Left     plates and screws  . Coronary angioplasty      x 1  . Umbilical hernia repair    . Umbilical hernia repair N/A 05/27/2013    Procedure: HERNIA REPAIR UMBILICAL ADULT;  Surgeon: Imogene Burn. Georgette Dover, MD;  Location: [place];  Service: General;  Laterality: N/A;  . Insertion of mesh N/A 05/27/2013    Procedure: INSERTION OF  MESH;  Surgeon: Imogene Burn. Georgette Dover, MD;  Location: [place];  Service: General;  Laterality: N/A;  . Cardiac catheterization N/A 07/18/2014    Procedure: Left Heart Cath and Coronary Angiography;  Surgeon: Lorretta Harp, MD; LAD patent, RCA 100% with L-R collaterals, OM1 80% stenosed proximal to previously placed stent, 30% ISR, EF 55%  . Renal artery stent Bilateral 2007    Dr. Albertine Patricia  . Coronary stent placement  1998    NIR 3.0 x 25 to the OM1  . Coronary stent placement  07/18/2014    SYNERGY DES 2.5X12 to the OM1   . Percutaneous coronary stent intervention (pci-s)  02/13/2015    des  to proximal circumflex  . Cardiac catheterization N/A 02/13/2015    Procedure: Left Heart Cath and Coronary Angiography;  Surgeon: Sherren Mocha, MD;  Location: [place] [facility];  Service: Cardiovascular;  Laterality: N/A;  . Cardiac catheterization Right 02/13/2015    Procedure: Coronary  Stent Intervention;  Surgeon: Sherren Mocha, MD;  Location: [place] [facility];  Service: Cardiovascular;  Laterality: Right;     Current Outpatient Prescriptions  Medication Sig Dispense Refill  . amLODipine (NORVASC) 10 MG tablet Take 1 tablet (10 mg total) by mouth daily. 90 tablet 0  . aspirin 81 MG tablet Take 1 tablet (81 mg total) by mouth daily.    Marland Kitchen atorvastatin (LIPITOR) 80 MG tablet Take 1 tablet (80 mg total) by mouth daily. 30 tablet 11  . carvedilol (COREG) 25 MG tablet TAKE ONE-HALF TABLET BY MOUTH TWICE DAILY (Patient taking differently: TAKE ONE-HALF (12.5 mg)  TABLET BY MOUTH TWICE DAILY) 30 tablet 10  . clopidogrel (PLAVIX) 75 MG tablet Take 1 tablet (75 mg total) by mouth daily with breakfast. 30 tablet 11  . isosorbide mononitrate (IMDUR) 30 MG 24 hr tablet Take 1 tablet (30 mg total) by mouth daily. 30 tablet 3  . levothyroxine (SYNTHROID, LEVOTHROID) 125 MCG tablet Take 1 tablet (125 mcg total) by mouth daily before breakfast. 90 tablet 3  . lisinopril-hydrochlorothiazide (PRINZIDE,ZESTORETIC) 20-12.5 MG per tablet Take 1 tablet by mouth 2 (two) times daily. 60 tablet 11  . metFORMIN (GLUCOPHAGE XR) 500 MG 24 hr tablet Take 1 tablet (500 mg total) by mouth daily with breakfast. 30 tablet 11  . nitroGLYCERIN (NITROSTAT) 0.4 MG SL tablet Place 1 tablet (0.4 mg total) under the tongue every 5 (five) minutes as needed for chest pain. Up to 3 doses 25 tablet 2   No current facility-administered medications for this visit.    Allergies:   Review of patient's allergies indicates no known allergies.    Social History:  The patient  reports that she has quit smoking. She has never used smokeless tobacco. She reports that she drinks alcohol. She reports that she does not use illicit drugs.   Family History:  The patient's family history includes Alzheimer's disease in her father; Hypertension in her mother; Osteoarthritis in her mother. There is no history of Cancer, Stroke,  Colon cancer, Esophageal cancer, Stomach cancer, or Rectal cancer.    ROS:  General:no colds or fevers, no weight changes Skin:no rashes or ulcers HEENT:no blurred vision, + congestion + cough and post nasal drip.  CV:see HPI PUL:see HPI GI:no diarrhea constipation or melena, no indigestion GU:no hematuria, no dysuria MS:no joint pain, no claudication Neuro:no syncope, no lightheadedness Endo:+ diabetes, no thyroid disease  Wt Readings from Last 3 Encounters:  03/01/15 211 lb 12.8 oz (96.072 kg)  02/14/15 201 lb 15.1  oz (91.6 kg)  01/31/15 212 lb (96.163 kg)     PHYSICAL EXAM: VS:  BP 142/82 mmHg  Pulse 67  Ht 5' 4.5" (1.638 m)  Wt 211 lb 12.8 oz (96.072 kg)  BMI 35.81 kg/m2  SpO2 95% , BMI Body mass index is 35.81 kg/(m^2). General:Pleasant affect, NAD Skin:Warm and dry, brisk capillary refill HEENT:normocephalic, sclera clear, mucus membranes moist Neck:supple, no JVD, no bruits  Heart:S1S2 RRR without murmur, gallup, rub or click Lungs:clear without rales, rhonchi, or wheezes [id number], non tender, + BS, do not palpate liver spleen or masses Ext:no lower ext edema, 2+ pedal pulses, 2+ radial pulses-rt radial cath site was stable.  Neuro:alert and oriented X 3, MAE, follows commands, + facial symmetry    EKG:  EKG is ordered today. The ekg ordered today demonstrates SR with 1st degree AV block, and similar T wave inversions in V5-6.    Recent Labs: 07/07/2014: TSH 3.682 07/18/2014: Magnesium 2.0 08/25/2014: ALT 13 02/14/2015: BUN 14; Creatinine, Ser 1.13*; Hemoglobin 11.9*; Platelets 284; Potassium 3.4*; Sodium 139    Lipid Panel    Component Value Date/Time   CHOL 107 01/26/2015 1410   CHOL 202* 08/25/2014 0741   TRIG 134 01/26/2015 1410   HDL 32* 01/26/2015 1410   HDL 33.20* 08/25/2014 0741   CHOLHDL 3.3 01/26/2015 1410   CHOLHDL 6 08/25/2014 0741   VLDL 26.8 08/25/2014 0741   LDLCALC 48 01/26/2015 1410   LDLCALC 142* 08/25/2014 0741   LDLDIRECT 196.8  07/11/2010 1107       Other studies Reviewed: Additional studies/ records that were reviewed today include: cath note, discharge summary..   ASSESSMENT AND PLAN:  1.  Angina resolved with stent to LCX.  2. CAD total occlusion of RCA, now with PCI + DES to the prox LCX. Continue DAPT for at least 12 months.  3. Hypokalemia, recheck to see if normal.  4.  Hyperlipidemia on high dose statin, keep on this for nowl.    Current medicines are reviewed with the patient today.  The patient Has no concerns regarding medicines.  The following changes have been made:  See above Labs/ tests ordered today include:see above  Disposition:   FU:  see above  Signed, Isaiah Serge, NP  03/01/2015 9:15 AM    [facility], [place], [place], [place] Phone: [phone]; Fax: [phone]

## 2015-03-01 ENCOUNTER — Encounter: Payer: Self-pay | Admitting: Cardiology

## 2015-03-01 ENCOUNTER — Ambulatory Visit (INDEPENDENT_AMBULATORY_CARE_PROVIDER_SITE_OTHER): Payer: Medicare Other | Admitting: Cardiology

## 2015-03-01 VITALS — BP 142/82 | HR 67 | Ht 64.5 in | Wt 211.8 lb

## 2015-03-01 DIAGNOSIS — I2510 Atherosclerotic heart disease of native coronary artery without angina pectoris: Secondary | ICD-10-CM

## 2015-03-01 DIAGNOSIS — E876 Hypokalemia: Secondary | ICD-10-CM

## 2015-03-01 DIAGNOSIS — E785 Hyperlipidemia, unspecified: Secondary | ICD-10-CM | POA: Diagnosis not present

## 2015-03-01 DIAGNOSIS — I10 Essential (primary) hypertension: Secondary | ICD-10-CM

## 2015-03-01 LAB — BASIC METABOLIC PANEL
BUN: 11 mg/dL (ref 7–25)
CO2: 28 mmol/L (ref 20–31)
Calcium: 8.9 mg/dL (ref 8.6–10.4)
Chloride: 105 mmol/L (ref 98–110)
Creat: 0.84 mg/dL (ref 0.50–0.99)
Glucose, Bld: 150 mg/dL — ABNORMAL HIGH (ref 65–99)
Potassium: 3.7 mmol/L (ref 3.5–5.3)
Sodium: 140 mmol/L (ref 135–146)

## 2015-03-01 NOTE — Patient Instructions (Signed)
Medication Instructions:  Your physician has recommended you make the following change in your medication:  1- Start claritin and call if your cough has not cleared up in a week and we will see about stopping your ace inhibitor.   Labwork: Your physician recommends that you have lab work today- BMET  Testing/Procedures: NONE  Follow-Up: Your physician wants you to follow-up in: 5 months with Dr. Burt Knack. You will receive a reminder letter in the mail two months in advance. If you don't receive a letter, please call our office to schedule the follow-up appointment.      If you need a refill on your cardiac medications before your next appointment, please call your pharmacy.

## 2015-03-02 ENCOUNTER — Other Ambulatory Visit: Payer: Self-pay | Admitting: Internal Medicine

## 2015-03-02 DIAGNOSIS — R928 Other abnormal and inconclusive findings on diagnostic imaging of breast: Secondary | ICD-10-CM

## 2015-03-03 NOTE — Addendum Note (Signed)
Addended by: Freada Bergeron on: 03/03/2015 02:29 PM   Modules accepted: Orders

## 2015-03-07 ENCOUNTER — Ambulatory Visit
Admission: RE | Admit: 2015-03-07 | Discharge: 2015-03-07 | Disposition: A | Discharge status: Home / Self Care | Payer: Medicare Other | Source: Ambulatory Visit | Attending: Internal Medicine | Admitting: Internal Medicine

## 2015-03-07 DIAGNOSIS — R928 Other abnormal and inconclusive findings on diagnostic imaging of breast: Secondary | ICD-10-CM

## 2015-03-07 DIAGNOSIS — N63 Unspecified lump in breast: Secondary | ICD-10-CM | POA: Diagnosis not present

## 2015-03-07 DIAGNOSIS — N6011 Diffuse cystic mastopathy of right breast: Secondary | ICD-10-CM | POA: Diagnosis not present

## 2015-03-14 DIAGNOSIS — H2513 Age-related nuclear cataract, bilateral: Secondary | ICD-10-CM | POA: Diagnosis not present

## 2015-03-14 DIAGNOSIS — H1851 Endothelial corneal dystrophy: Secondary | ICD-10-CM | POA: Diagnosis not present

## 2015-03-14 DIAGNOSIS — H04123 Dry eye syndrome of bilateral lacrimal glands: Secondary | ICD-10-CM | POA: Diagnosis not present

## 2015-03-14 DIAGNOSIS — E119 Type 2 diabetes mellitus without complications: Secondary | ICD-10-CM | POA: Diagnosis not present

## 2015-03-14 LAB — HM DIABETES EYE EXAM

## 2015-03-16 ENCOUNTER — Ambulatory Visit (HOSPITAL_COMMUNITY): Payer: Self-pay

## 2015-03-23 ENCOUNTER — Ambulatory Visit (HOSPITAL_COMMUNITY): Payer: Self-pay

## 2015-03-27 ENCOUNTER — Ambulatory Visit (HOSPITAL_COMMUNITY): Payer: Self-pay

## 2015-03-29 ENCOUNTER — Encounter: Payer: Self-pay | Admitting: *Deleted

## 2015-03-29 ENCOUNTER — Ambulatory Visit (HOSPITAL_COMMUNITY): Payer: Self-pay

## 2015-03-31 ENCOUNTER — Ambulatory Visit (HOSPITAL_COMMUNITY): Payer: Self-pay

## 2015-04-03 ENCOUNTER — Ambulatory Visit (HOSPITAL_COMMUNITY): Payer: Self-pay

## 2015-04-05 ENCOUNTER — Ambulatory Visit (HOSPITAL_COMMUNITY): Payer: Self-pay

## 2015-04-07 ENCOUNTER — Ambulatory Visit (HOSPITAL_COMMUNITY): Payer: Self-pay

## 2015-04-10 ENCOUNTER — Ambulatory Visit (HOSPITAL_COMMUNITY): Payer: Self-pay

## 2015-04-12 ENCOUNTER — Ambulatory Visit (HOSPITAL_COMMUNITY): Payer: Self-pay

## 2015-04-14 ENCOUNTER — Ambulatory Visit (HOSPITAL_COMMUNITY): Payer: Self-pay

## 2015-04-17 ENCOUNTER — Ambulatory Visit (HOSPITAL_COMMUNITY): Payer: Self-pay

## 2015-04-19 ENCOUNTER — Ambulatory Visit (HOSPITAL_COMMUNITY): Payer: Self-pay

## 2015-04-21 ENCOUNTER — Ambulatory Visit (HOSPITAL_COMMUNITY): Payer: Self-pay

## 2015-04-24 ENCOUNTER — Ambulatory Visit (HOSPITAL_COMMUNITY): Payer: Self-pay

## 2015-04-26 ENCOUNTER — Ambulatory Visit (HOSPITAL_COMMUNITY): Payer: Self-pay

## 2015-04-28 ENCOUNTER — Ambulatory Visit (HOSPITAL_COMMUNITY): Payer: Self-pay

## 2015-05-01 ENCOUNTER — Ambulatory Visit (HOSPITAL_COMMUNITY): Payer: Self-pay

## 2015-05-03 ENCOUNTER — Ambulatory Visit (HOSPITAL_COMMUNITY): Payer: Self-pay

## 2015-05-05 ENCOUNTER — Ambulatory Visit (HOSPITAL_COMMUNITY): Payer: Self-pay

## 2015-05-08 ENCOUNTER — Ambulatory Visit (HOSPITAL_COMMUNITY): Payer: Self-pay

## 2015-05-10 ENCOUNTER — Ambulatory Visit (HOSPITAL_COMMUNITY): Payer: Self-pay

## 2015-05-12 ENCOUNTER — Other Ambulatory Visit: Payer: Self-pay | Admitting: Cardiovascular Disease

## 2015-05-12 ENCOUNTER — Ambulatory Visit (HOSPITAL_COMMUNITY): Payer: Self-pay

## 2015-05-15 ENCOUNTER — Ambulatory Visit (HOSPITAL_COMMUNITY): Payer: Self-pay

## 2015-05-17 ENCOUNTER — Ambulatory Visit (HOSPITAL_COMMUNITY): Payer: Self-pay

## 2015-05-19 ENCOUNTER — Ambulatory Visit (HOSPITAL_COMMUNITY): Payer: Self-pay

## 2015-05-22 ENCOUNTER — Ambulatory Visit (HOSPITAL_COMMUNITY): Payer: Self-pay

## 2015-05-24 ENCOUNTER — Ambulatory Visit (HOSPITAL_COMMUNITY): Payer: Self-pay

## 2015-05-26 ENCOUNTER — Ambulatory Visit (HOSPITAL_COMMUNITY): Payer: Self-pay

## 2015-05-29 ENCOUNTER — Ambulatory Visit (HOSPITAL_COMMUNITY): Payer: Self-pay

## 2015-05-31 ENCOUNTER — Ambulatory Visit (HOSPITAL_COMMUNITY): Payer: Self-pay

## 2015-06-02 ENCOUNTER — Ambulatory Visit (HOSPITAL_COMMUNITY): Payer: Self-pay

## 2015-06-05 ENCOUNTER — Ambulatory Visit (HOSPITAL_COMMUNITY): Payer: Self-pay

## 2015-06-07 ENCOUNTER — Ambulatory Visit (HOSPITAL_COMMUNITY): Payer: Self-pay

## 2015-06-09 ENCOUNTER — Ambulatory Visit (HOSPITAL_COMMUNITY): Payer: Self-pay

## 2015-06-12 ENCOUNTER — Ambulatory Visit (HOSPITAL_COMMUNITY): Payer: Self-pay

## 2015-06-14 ENCOUNTER — Ambulatory Visit (HOSPITAL_COMMUNITY): Payer: Self-pay

## 2015-06-16 ENCOUNTER — Other Ambulatory Visit: Payer: Self-pay | Admitting: Cardiology

## 2015-06-16 ENCOUNTER — Ambulatory Visit (HOSPITAL_COMMUNITY): Payer: Self-pay

## 2015-06-19 ENCOUNTER — Ambulatory Visit (HOSPITAL_COMMUNITY): Payer: Self-pay

## 2015-06-21 ENCOUNTER — Ambulatory Visit (HOSPITAL_COMMUNITY): Payer: Self-pay

## 2015-06-23 ENCOUNTER — Ambulatory Visit (HOSPITAL_COMMUNITY): Payer: Self-pay

## 2015-06-26 ENCOUNTER — Ambulatory Visit (HOSPITAL_COMMUNITY): Payer: Self-pay

## 2015-06-28 ENCOUNTER — Ambulatory Visit (HOSPITAL_COMMUNITY): Payer: Self-pay

## 2015-06-30 ENCOUNTER — Ambulatory Visit (HOSPITAL_COMMUNITY): Payer: Self-pay

## 2015-07-08 ENCOUNTER — Emergency Department (HOSPITAL_COMMUNITY): Payer: Medicare Other

## 2015-07-08 ENCOUNTER — Observation Stay (HOSPITAL_COMMUNITY)
Admission: EM | Admit: 2015-07-08 | Discharge: 2015-07-11 | Disposition: A | Discharge status: Home / Self Care | Payer: Medicare Other | Attending: Oncology | Admitting: Oncology

## 2015-07-08 ENCOUNTER — Encounter (HOSPITAL_COMMUNITY): Payer: Self-pay | Admitting: Nurse Practitioner

## 2015-07-08 DIAGNOSIS — I69354 Hemiplegia and hemiparesis following cerebral infarction affecting left non-dominant side: Secondary | ICD-10-CM | POA: Diagnosis not present

## 2015-07-08 DIAGNOSIS — I2582 Chronic total occlusion of coronary artery: Secondary | ICD-10-CM | POA: Insufficient documentation

## 2015-07-08 DIAGNOSIS — R55 Syncope and collapse: Secondary | ICD-10-CM | POA: Diagnosis present

## 2015-07-08 DIAGNOSIS — E876 Hypokalemia: Secondary | ICD-10-CM | POA: Diagnosis not present

## 2015-07-08 DIAGNOSIS — Z87891 Personal history of nicotine dependence: Secondary | ICD-10-CM | POA: Diagnosis not present

## 2015-07-08 DIAGNOSIS — I472 Ventricular tachycardia: Secondary | ICD-10-CM | POA: Diagnosis not present

## 2015-07-08 DIAGNOSIS — I2510 Atherosclerotic heart disease of native coronary artery without angina pectoris: Secondary | ICD-10-CM | POA: Diagnosis present

## 2015-07-08 DIAGNOSIS — R079 Chest pain, unspecified: Secondary | ICD-10-CM | POA: Diagnosis present

## 2015-07-08 DIAGNOSIS — I25110 Atherosclerotic heart disease of native coronary artery with unstable angina pectoris: Secondary | ICD-10-CM | POA: Diagnosis not present

## 2015-07-08 DIAGNOSIS — Z7982 Long term (current) use of aspirin: Secondary | ICD-10-CM | POA: Insufficient documentation

## 2015-07-08 DIAGNOSIS — E039 Hypothyroidism, unspecified: Secondary | ICD-10-CM | POA: Insufficient documentation

## 2015-07-08 DIAGNOSIS — Z79899 Other long term (current) drug therapy: Secondary | ICD-10-CM | POA: Insufficient documentation

## 2015-07-08 DIAGNOSIS — F329 Major depressive disorder, single episode, unspecified: Secondary | ICD-10-CM | POA: Diagnosis not present

## 2015-07-08 DIAGNOSIS — R0789 Other chest pain: Secondary | ICD-10-CM | POA: Diagnosis not present

## 2015-07-08 DIAGNOSIS — I200 Unstable angina: Secondary | ICD-10-CM | POA: Diagnosis present

## 2015-07-08 DIAGNOSIS — Z7902 Long term (current) use of antithrombotics/antiplatelets: Secondary | ICD-10-CM | POA: Insufficient documentation

## 2015-07-08 DIAGNOSIS — E785 Hyperlipidemia, unspecified: Secondary | ICD-10-CM | POA: Diagnosis not present

## 2015-07-08 DIAGNOSIS — E119 Type 2 diabetes mellitus without complications: Secondary | ICD-10-CM | POA: Diagnosis not present

## 2015-07-08 DIAGNOSIS — Z6835 Body mass index (BMI) 35.0-35.9, adult: Secondary | ICD-10-CM | POA: Insufficient documentation

## 2015-07-08 DIAGNOSIS — I25119 Atherosclerotic heart disease of native coronary artery with unspecified angina pectoris: Secondary | ICD-10-CM | POA: Diagnosis present

## 2015-07-08 DIAGNOSIS — I10 Essential (primary) hypertension: Secondary | ICD-10-CM | POA: Diagnosis not present

## 2015-07-08 DIAGNOSIS — Z7984 Long term (current) use of oral hypoglycemic drugs: Secondary | ICD-10-CM | POA: Insufficient documentation

## 2015-07-08 DIAGNOSIS — F32A Depression, unspecified: Secondary | ICD-10-CM | POA: Diagnosis present

## 2015-07-08 DIAGNOSIS — N179 Acute kidney failure, unspecified: Secondary | ICD-10-CM | POA: Insufficient documentation

## 2015-07-08 DIAGNOSIS — G4733 Obstructive sleep apnea (adult) (pediatric): Secondary | ICD-10-CM | POA: Diagnosis not present

## 2015-07-08 DIAGNOSIS — Z955 Presence of coronary angioplasty implant and graft: Secondary | ICD-10-CM | POA: Diagnosis not present

## 2015-07-08 DIAGNOSIS — E669 Obesity, unspecified: Secondary | ICD-10-CM | POA: Insufficient documentation

## 2015-07-08 LAB — BASIC METABOLIC PANEL
Anion gap: 8 (ref 5–15)
BUN: 12 mg/dL (ref 6–20)
CO2: 26 mmol/L (ref 22–32)
Calcium: 9 mg/dL (ref 8.9–10.3)
Chloride: 102 mmol/L (ref 101–111)
Creatinine, Ser: 1.41 mg/dL — ABNORMAL HIGH (ref 0.44–1.00)
GFR calc Af Amer: 44 mL/min — ABNORMAL LOW (ref >60)
GFR calc non Af Amer: 38 mL/min — ABNORMAL LOW (ref >60)
Glucose, Bld: 229 mg/dL — ABNORMAL HIGH (ref 65–99)
Potassium: 3.1 mmol/L — ABNORMAL LOW (ref 3.5–5.1)
Sodium: 136 mmol/L (ref 135–145)

## 2015-07-08 LAB — TROPONIN I: Troponin I: 0.03 ng/mL (ref <0.031)

## 2015-07-08 LAB — CBC
HCT: 36.8 % (ref 36.0–46.0)
Hemoglobin: 11.5 g/dL — ABNORMAL LOW (ref 12.0–15.0)
MCH: 26.9 pg (ref 26.0–34.0)
MCHC: 31.3 g/dL (ref 30.0–36.0)
MCV: 86.2 fL (ref 78.0–100.0)
Platelets: 274 10*3/uL (ref 150–400)
RBC: 4.27 MIL/uL (ref 3.87–5.11)
RDW: 14.3 % (ref 11.5–15.5)
WBC: 8.2 10*3/uL (ref 4.0–10.5)

## 2015-07-08 LAB — I-STAT TROPONIN, ED: Troponin i, poc: 0.01 ng/mL (ref 0.00–0.08)

## 2015-07-08 LAB — CBG MONITORING, ED: Glucose-Capillary: 213 mg/dL — ABNORMAL HIGH (ref 65–99)

## 2015-07-08 MED ORDER — SODIUM CHLORIDE 0.9 % IV SOLN
INTRAVENOUS | Status: DC
  Administered 2015-07-09: 01:00:00 via INTRAVENOUS

## 2015-07-08 MED ORDER — ATORVASTATIN CALCIUM 80 MG PO TABS
80.0000 mg | ORAL_TABLET | Freq: Every day | ORAL | Status: DC
  Administered 2015-07-09 – 2015-07-11 (×4): 80 mg via ORAL
  Filled 2015-07-08 (×5): qty 1

## 2015-07-08 MED ORDER — ACETAMINOPHEN 325 MG PO TABS
650.0000 mg | ORAL_TABLET | Freq: Four times a day (QID) | ORAL | Status: DC | PRN
  Administered 2015-07-10: 650 mg via ORAL

## 2015-07-08 MED ORDER — SODIUM CHLORIDE 0.9% FLUSH
3.0000 mL | Freq: Two times a day (BID) | INTRAVENOUS | Status: DC
  Administered 2015-07-09: 3 mL via INTRAVENOUS

## 2015-07-08 MED ORDER — ASPIRIN 325 MG PO TABS
81.0000 mg | ORAL_TABLET | Freq: Every day | ORAL | Status: DC
Start: 2015-07-09 — End: 2015-07-08

## 2015-07-08 MED ORDER — ASPIRIN 81 MG PO CHEW: 81.0000 mg | CHEWABLE_TABLET | Freq: Every day | ORAL | Status: DC

## 2015-07-08 MED ORDER — SODIUM CHLORIDE 0.9 % IV BOLUS (SEPSIS)
1000.0000 mL | Freq: Once | INTRAVENOUS | Status: AC
Start: 2015-07-08 — End: 2015-07-08
  Administered 2015-07-08: 1000 mL via INTRAVENOUS

## 2015-07-08 MED ORDER — ENOXAPARIN SODIUM 40 MG/0.4ML ~~LOC~~ SOLN
40.0000 mg | SUBCUTANEOUS | Status: DC
  Administered 2015-07-09 – 2015-07-11 (×3): 40 mg via SUBCUTANEOUS
  Filled 2015-07-08 (×3): qty 0.4

## 2015-07-08 MED ORDER — ACETAMINOPHEN 650 MG RE SUPP: 650.0000 mg | Freq: Four times a day (QID) | RECTAL | Status: DC | PRN

## 2015-07-08 MED ORDER — LEVOTHYROXINE SODIUM 25 MCG PO TABS
125.0000 ug | ORAL_TABLET | Freq: Every day | ORAL | Status: DC
  Administered 2015-07-09 – 2015-07-10 (×2): 125 ug via ORAL
  Filled 2015-07-08 (×4): qty 1

## 2015-07-08 MED ORDER — CLOPIDOGREL BISULFATE 75 MG PO TABS
75.0000 mg | ORAL_TABLET | Freq: Every day | ORAL | Status: DC
  Administered 2015-07-09 – 2015-07-11 (×3): 75 mg via ORAL
  Filled 2015-07-08 (×3): qty 1

## 2015-07-08 NOTE — ED Notes (Signed)
The pt complains that  She was having hot flashes and had a stiffness in her lt neck took a sl nitro and fainted.  Her family at the bedside reports that she is sl confused  However she is alert and oriented x 4 skin warm and dry.  Still has a sl lt neck stiffness.

## 2015-07-08 NOTE — ED Provider Notes (Signed)
CSN: [id number]     Arrival date & time 07/08/15  1908 History   First MD Initiated Contact with Patient 07/08/15 1929     Chief Complaint  Patient presents with  . Loss of Consciousness   HPI  [name] is a 70 year old female with past medical history including coronary artery disease with 3 drug-eluting stents, diabetes, hypertension, TIA presenting with lightheadedness. Patient states that she was at an outdoor baby shower when she became overheated. She then got in the car to drive home and opened the windows for air. She states that she was driving she began to feel unwell and pulled the vehicle over. She became lightheaded and "woozy". She walked into a store and sat in a chair so she would not fall. She reports associated diaphoresis, left-sided neck pain, chest pain, shortness of breath, back pain and nausea. She describes the chest pain as being central to her chest and radiating to her back. She states that it was a heavy sensation like something was sitting on her back. She reports that she was short of breath when she was walking but not at rest. Her neck pain felt like a stiffness and she was attempting to "work it out by turning my neck side-to-side". She then took a nitro. An employee at the store called her sister's and told them that she was going in and out of consciousness. Patient states that she is unsure if she lost consciousness or not because she does not remember this event very well. She does state that she did not fall as she was seated for the entirety of her episode. He took a nitroglycerin which improved her symptoms and her sisters brought her to the emergency department for evaluation. She states that she has had similar symptoms with chest heaviness and neck pain in the past. She presented to the emergency department if these symptoms and was admitted to the hospital and received stents at that time. She states that her symptoms have almost completely resolved currently. She  states she still feels a little "woozy".  Past Medical History  Diagnosis Date  . Cervical polyp   . Neck mass   . Cervical lymphadenopathy   . Urinary tract infection   . Hyperlipidemia     takes Crestor daily  . Coronary artery disease     Prior stent of the lCX with chronic total occlusion of a small RCA. Last cath in 2004 showing patent stent, nonobstructive disease and total RCA. EF is 65 to 70%. She has been managed medically.   . Tubular adenoma of rectum 10/24/04  . Diabetes mellitus without complication ([place])     takes Metformin daily  . Hypothyroidism     takes SYnthroid daily  . Hypertension     takes Amlodipine,Coreg,and Prinzide daily  . TIA (transient ischemic attack)   . Back pain     reason unknown  . History of colon polyps   . Depression     sees a Social worker at [facility] but no meds  . Umbilical hernia   . OSA (obstructive sleep apnea)     PT DENIES, STATES SHE WAS TOLD SHE DIDN'T HAVE OSA, NO CPAP  . PTSD (post-traumatic stress disorder)   . Renal artery stenosis ([place]) 2007  . Stroke ([place])     TIA- LAST ONE GREATER THAN 10 YEARS PER PT  . CAD (coronary artery disease)    Past Surgical History  Procedure Laterality Date  . Cardiac catheterization  2005/2006/2007/2010  .  Colonoscopy  10/2004  . Appendectomy    . Ankle surgery Left     plates and screws  . Coronary angioplasty      x 1  . Umbilical hernia repair    . Umbilical hernia repair N/A 05/27/2013    Procedure: HERNIA REPAIR UMBILICAL ADULT;  Surgeon: Imogene Burn. Georgette Dover, MD;  Location: [facility];  Service: General;  Laterality: N/A;  . Insertion of mesh N/A 05/27/2013    Procedure: INSERTION OF MESH;  Surgeon: Imogene Burn. Georgette Dover, MD;  Location: [facility];  Service: General;  Laterality: N/A;  . Cardiac catheterization N/A 07/18/2014    Procedure: Left Heart Cath and Coronary Angiography;  Surgeon: Lorretta Harp, MD; LAD patent, RCA 100% with L-R collaterals, OM1 80% stenosed proximal to previously  placed stent, 30% ISR, EF 55%  . Renal artery stent Bilateral 2007    Dr. Albertine Patricia  . Coronary stent placement  1998    NIR 3.0 x 25 to the OM1  . Coronary stent placement  07/18/2014    SYNERGY DES 2.5X12 to the OM1   . Percutaneous coronary stent intervention (pci-s)  02/13/2015    des  to proximal circumflex  . Cardiac catheterization N/A 02/13/2015    Procedure: Left Heart Cath and Coronary Angiography;  Surgeon: Sherren Mocha, MD;  Location: [place] [facility];  Service: Cardiovascular;  Laterality: N/A;  . Cardiac catheterization Right 02/13/2015    Procedure: Coronary Stent Intervention;  Surgeon: Sherren Mocha, MD;  Location: [place] [facility];  Service: Cardiovascular;  Laterality: Right;   Family History  Problem Relation Age of Onset  . Hypertension Mother   . Osteoarthritis Mother   . Alzheimer's disease Father   . Cancer Neg Hx   . Stroke Neg Hx   . Colon cancer Neg Hx   . Esophageal cancer Neg Hx   . Stomach cancer Neg Hx   . Rectal cancer Neg Hx    Social History  Substance Use Topics  . Smoking status: Former Research scientist (life sciences)  . Smokeless tobacco: Never Used     Comment: quit smoking about 30 yrs ago  . Alcohol Use: 0.0 oz/week    0 Standard drinks or equivalent per week     Comment: Wine on special occasions   OB History    No data available     Review of Systems  All other systems reviewed and are negative.     Allergies  Review of patient's allergies indicates no known allergies.  Home Medications   Prior to Admission medications   Medication Sig Start Date End Date Taking? Authorizing Provider  amLODipine (NORVASC) 10 MG tablet Take 1 tablet (10 mg total) by mouth daily. 07/06/14   Lucious Groves, DO  aspirin 81 MG tablet Take 1 tablet (81 mg total) by mouth daily. 07/19/14   Rhonda G Cote Mayabb, PA-C  atorvastatin (LIPITOR) 80 MG tablet Take 1 tablet (80 mg total) by mouth daily. 07/26/14 07/26/15  Lucious Groves, DO  carvedilol (COREG) 25 MG tablet TAKE  ONE-HALF TABLET BY MOUTH TWICE DAILY 05/12/15   Sherren Mocha, MD  clopidogrel (PLAVIX) 75 MG tablet Take 1 tablet (75 mg total) by mouth daily with breakfast. 07/19/14   Evelene Croon Irvan Tiedt, PA-C  isosorbide mononitrate (IMDUR) 30 MG 24 hr tablet TAKE ONE TABLET BY MOUTH ONCE DAILY 06/19/15   Will Meredith Leeds, MD  levothyroxine (SYNTHROID, LEVOTHROID) 125 MCG tablet Take 1 tablet (125 mcg total) by mouth daily before breakfast. 02/09/15  Oval Linsey, MD  lisinopril-hydrochlorothiazide (PRINZIDE,ZESTORETIC) 20-12.5 MG per tablet Take 1 tablet by mouth 2 (two) times daily. 07/07/14   Lucious Groves, DO  metFORMIN (GLUCOPHAGE XR) 500 MG 24 hr tablet Take 1 tablet (500 mg total) by mouth daily with breakfast. 02/16/15 02/16/16  Brittainy Erie Noe, PA-C  nitroGLYCERIN (NITROSTAT) 0.4 MG SL tablet Place 1 tablet (0.4 mg total) under the tongue every 5 (five) minutes as needed for chest pain. Up to 3 doses 02/14/15   Brittainy M Simmons, PA-C   BP 125/71 mmHg  Pulse 77  Temp(Src) 97.9 F (36.6 C) (Oral)  Resp 17  Ht 5' 4.5" (1.638 m)  SpO2 98% Physical Exam  Constitutional: She is oriented to person, place, and time. She appears well-developed and well-nourished. No distress.  Appears older than stated age  HENT:  Head: Normocephalic and atraumatic.  Mouth/Throat: Oropharynx is clear and moist.  Eyes: Conjunctivae and EOM are normal. Pupils are equal, round, and reactive to light. Right eye exhibits no discharge. Left eye exhibits no discharge. No scleral icterus.  Neck: Normal range of motion. Neck supple.  Cardiovascular: Normal rate, regular rhythm and normal heart sounds.   Pulmonary/Chest: Effort normal and breath sounds normal. No respiratory distress. She has no wheezes. She has no rales.  Abdominal: Soft. Bowel sounds are normal. She exhibits no distension. There is no tenderness.  Musculoskeletal: Normal range of motion.  Moves all extremities spontaneously. No peripheral edema.   Neurological: She is alert and oriented to person, place, and time. Coordination normal.  Patient mentating appropriately. Alert and oriented 4.  Skin: Skin is warm and dry.  Psychiatric: She has a normal mood and affect. Her behavior is normal.  Nursing note and vitals reviewed.   ED Course  Procedures (including critical care time) Labs Review Labs Reviewed  BASIC METABOLIC PANEL - Abnormal; Notable for the following:    Potassium 3.1 (*)    Glucose, Bld 229 (*)    Creatinine, Ser 1.41 (*)    GFR calc non Af Amer 38 (*)    GFR calc Af Amer 44 (*)    All other components within normal limits  CBC - Abnormal; Notable for the following:    Hemoglobin 11.5 (*)    All other components within normal limits  CBG MONITORING, ED - Abnormal; Notable for the following:    Glucose-Capillary 213 (*)    All other components within normal limits  TROPONIN I  URINALYSIS, ROUTINE W REFLEX MICROSCOPIC (NOT AT [facility])  BASIC METABOLIC PANEL  CBC  TROPONIN I  TROPONIN I  HEMOGLOBIN A1C  I-STAT TROPOININ, ED    Imaging Review Dg Chest 2 View  07/08/2015  CLINICAL DATA:  Chest pain. EXAM: CHEST  2 VIEW COMPARISON:  July 18, 2014 FINDINGS: Mild cardiomegaly. The hila, mediastinum, lungs, and pleura are unremarkable. IMPRESSION: No active cardiopulmonary disease. Electronically Signed   By: Dorise Bullion III M.D   On: 07/08/2015 21:10   I have personally reviewed and evaluated these images and lab results as part of my medical decision-making.   EKG Interpretation   Date/Time:  Saturday July 08 2015 19:17:06 EDT Ventricular Rate:  81 PR Interval:  192 QRS Duration: 88 QT Interval:  422 QTC Calculation: 490 R Axis:   -9 Text Interpretation:  Normal sinus rhythm Nonspecific T wave abnormality  Prolonged QT Abnormal ECG biphasic t waves in V5-V6 new since January 2017  Confirmed by [facility] MD, Corene Cornea [phone]) on 07/08/2015 8:35:35 PM  MDM   Final diagnoses:  Syncope, unspecified  syncope type  Chest pain, unspecified chest pain type   70 year old female with extensive cardiac history presenting with episodes of lightheadedness, diaphoresis, chest pain, shortness of breath and syncope. Afebrile and hemodynamically stable. Benign physical exam. No leukocytosis. Creatinine elevated from baseline. Chest x-ray negative. Initial troponin negative with a nonischemic EKG. There is a new T wave abnormality and QT prolongation. Given patient's significant cardiac history, high heart score and concerning presentation, patient will be admitted to the internal medicine teaching service for further cardiac workup.    Lahoma Crocker Raynold Blankenbaker, PA-C 07/09/15 [id number]  Merrily Pew, MD 07/09/15 [phone]

## 2015-07-08 NOTE — ED Notes (Signed)
Family brought pt in for 2-3 episodes of syncope this afternoon. Family reports each time the patient became diaphoretic. She c/o upper back pain. She took a nitro with some relief. She is alert and breathing easily

## 2015-07-08 NOTE — ED Notes (Signed)
Sandwich given 

## 2015-07-08 NOTE — H&P (Signed)
Date: 07/09/2015               Patient Name:  [name] MRN: [id number]  DOB: Mar 17, 1945 Age / Sex: 70 y.o., female   PCP: Lucious Groves, DO         Medical Service: Internal Medicine Teaching Service         Attending Physician: Dr. Annia Belt, MD    First Contact: Dr. Lovena Le Pager: [phone]  Second Contact: Dr. Posey Pronto Pager: [phone]       After Hours (After 5p/  First Contact Pager: [phone]  weekends / holidays): Second Contact Pager: [phone]   Chief Complaint: lightheadedness, chest pain, back pain   History of Present Illness: Patient is a 70 yo F with a PMHx of CAD, HTN, HLD, hypothyroidism, TIA, OSA presenting to the hospital via EMS after she was found to be confused at a grocery store. Patient states she was in her usual state of health until she was visiting someone today. States as she was getting out of her car, she experienced chest pressure that radiated to her back and neck, diaphoresis, nausea, and SOB. She took one dose of nitroglycerin and felt better afterwards. Patient then went to a grocery store where people though she was confused and about to faint. States she was feeling lightheaded and does not remember anything else. States her left hand "feels like it is asleep" intermittently for the past 3-4 weeks and is chronically weaker than her right hand due to a stroke in the past. Denies having any lower extremity weakness or numbness. Reports taking Aspirin and Plavix daily. Denies any history of seizures. States she experiences ringing in her ears on occasion. Patient denies any history of recent travel or leg swelling. States she has chronic pain in her lower legs and calves. Denies any symptoms of orthopnea. Reports having a chronic cough. States she has been feeling tired and "cranky" for several days.     Meds: Current Facility-Administered Medications  Medication Dose Route Frequency Provider Last Rate Last Dose  . 0.9 %  sodium chloride infusion    Intravenous Continuous Tasrif Ahmed, MD 125 mL/hr at 07/09/15 0119    . acetaminophen (TYLENOL) tablet 650 mg  650 mg Oral Q6H PRN Shela Leff, MD       Or  . acetaminophen (TYLENOL) suppository 650 mg  650 mg Rectal Q6H PRN Shela Leff, MD      . aspirin chewable tablet 81 mg  81 mg Oral Daily Annia Belt, MD   81 mg at 07/09/15 0119  . atorvastatin (LIPITOR) tablet 80 mg  80 mg Oral Daily Shela Leff, MD   80 mg at 07/09/15 0119  . clopidogrel (PLAVIX) tablet 75 mg  75 mg Oral Q breakfast Shela Leff, MD      . enoxaparin (LOVENOX) injection 40 mg  40 mg Subcutaneous Q24H Shela Leff, MD      . levothyroxine (SYNTHROID, LEVOTHROID) tablet 125 mcg  125 mcg Oral QAC breakfast Shela Leff, MD      . magnesium sulfate IVPB 2 g 50 mL  2 g Intravenous Once Dellia Nims, MD        Allergies: Allergies as of 07/08/2015  . (No Known Allergies)   Past Medical History  Diagnosis Date  . Cervical polyp   . Neck mass   . Cervical lymphadenopathy   . Urinary tract infection   . Hyperlipidemia     takes Crestor daily  . Coronary artery  disease     Prior stent of the lCX with chronic total occlusion of a small RCA. Last cath in 2004 showing patent stent, nonobstructive disease and total RCA. EF is 65 to 70%. She has been managed medically.   . Tubular adenoma of rectum 10/24/04  . Diabetes mellitus without complication ([place])     takes Metformin daily  . Hypothyroidism     takes SYnthroid daily  . Hypertension     takes Amlodipine,Coreg,and Prinzide daily  . TIA (transient ischemic attack)   . Back pain     reason unknown  . History of colon polyps   . Depression     sees a Social worker at [facility] but no meds  . Umbilical hernia   . OSA (obstructive sleep apnea)     PT DENIES, STATES SHE WAS TOLD SHE DIDN'T HAVE OSA, NO CPAP  . PTSD (post-traumatic stress disorder)   . Renal artery stenosis ([place]) 2007  . Stroke ([place])     TIA- LAST ONE  GREATER THAN 10 YEARS PER PT  . CAD (coronary artery disease)    Past Surgical History  Procedure Laterality Date  . Cardiac catheterization  2005/2006/2007/2010  . Colonoscopy  10/2004  . Appendectomy    . Ankle surgery Left     plates and screws  . Coronary angioplasty      x 1  . Umbilical hernia repair    . Umbilical hernia repair N/A 05/27/2013    Procedure: HERNIA REPAIR UMBILICAL ADULT;  Surgeon: Imogene Burn. Georgette Dover, MD;  Location: [place];  Service: General;  Laterality: N/A;  . Insertion of mesh N/A 05/27/2013    Procedure: INSERTION OF MESH;  Surgeon: Imogene Burn. Georgette Dover, MD;  Location: [place];  Service: General;  Laterality: N/A;  . Cardiac catheterization N/A 07/18/2014    Procedure: Left Heart Cath and Coronary Angiography;  Surgeon: Lorretta Harp, MD; LAD patent, RCA 100% with L-R collaterals, OM1 80% stenosed proximal to previously placed stent, 30% ISR, EF 55%  . Renal artery stent Bilateral 2007    Dr. Albertine Patricia  . Coronary stent placement  1998    NIR 3.0 x 25 to the OM1  . Coronary stent placement  07/18/2014    SYNERGY DES 2.5X12 to the OM1   . Percutaneous coronary stent intervention (pci-s)  02/13/2015    des  to proximal circumflex  . Cardiac catheterization N/A 02/13/2015    Procedure: Left Heart Cath and Coronary Angiography;  Surgeon: Sherren Mocha, MD;  Location: [place] [facility];  Service: Cardiovascular;  Laterality: N/A;  . Cardiac catheterization Right 02/13/2015    Procedure: Coronary Stent Intervention;  Surgeon: Sherren Mocha, MD;  Location: [facility];  Service: Cardiovascular;  Laterality: Right;   Family History  Problem Relation Age of Onset  . Hypertension Mother   . Osteoarthritis Mother   . Alzheimer's disease Father   . Cancer Neg Hx   . Stroke Neg Hx   . Colon cancer Neg Hx   . Esophageal cancer Neg Hx   . Stomach cancer Neg Hx   . Rectal cancer Neg Hx    Social History   Social History  . Marital Status: Single    Spouse Name: N/A   . Number of Children: N/A  . Years of Education: N/A   Occupational History  . Not on file.   Social History Main Topics  . Smoking status: Former Research scientist (life sciences)  . Smokeless tobacco: Never Used  Comment: quit smoking about 30 yrs ago  . Alcohol Use: 0.0 oz/week    0 Standard drinks or equivalent per week     Comment: Wine on special occasions  . Drug Use: No  . Sexual Activity: Not on file   Other Topics Concern  . Not on file   Social History Narrative    Review of Systems: Review of Systems  Constitutional: Positive for malaise/fatigue and diaphoresis. Negative for fever and chills.  HENT: Negative for congestion.        Chronic rhinorrhea   Eyes: Negative for blurred vision and pain.  Respiratory: Positive for cough and shortness of breath. Negative for sputum production and wheezing.        Chronic cough   Cardiovascular: Positive for chest pain. Negative for palpitations, orthopnea and leg swelling.  Gastrointestinal: Positive for nausea. Negative for vomiting, abdominal pain and diarrhea.  Genitourinary: Negative for dysuria and flank pain.  Musculoskeletal: Positive for back pain and neck pain.  Skin: Negative for itching and rash.  Neurological: Positive for sensory change. Negative for focal weakness, seizures, loss of consciousness and headaches.       Lightheadedness     Physical Exam: Blood pressure 125/71, pulse 77, temperature 97.9 F (36.6 C), temperature source Oral, resp. rate 17, height 5' 4.5" (1.638 m), SpO2 98 %. Physical Exam  Constitutional: She is oriented to person, place, and time. She appears well-developed and well-nourished. No distress.  HENT:  Head: Normocephalic and atraumatic.  Mouth/Throat: Oropharynx is clear and moist.  Eyes: EOM are normal. Pupils are equal, round, and reactive to light.  Neck: Neck supple. No tracheal deviation present.  Cardiovascular: Normal rate, regular rhythm and intact distal pulses.  Exam reveals no gallop  and no friction rub.   No murmur heard. Pulmonary/Chest: Effort normal and breath sounds normal. No respiratory distress. She has no wheezes. She has no rales.  Abdominal: Soft. Bowel sounds are normal. She exhibits no distension. There is no tenderness. There is no guarding.  Musculoskeletal: She exhibits no edema.  Calves: Mildly tender to palpation bilaterally. They appear symmetrical in size. No increased erythema, warmth, or edema.   Neurological: She is alert and oriented to person, place, and time. No cranial nerve deficit.  Strength 4/5 in LUE and 5/5 in RUE Strength 5/5 in bilateral lower extremities Sensation to light touch grossly intact throughout Finger-to-nose test normal  Skin: Skin is warm and dry. No rash noted. She is not diaphoretic. No erythema.    Lab results: Basic Metabolic Panel:  Recent Labs  07/08/15 1929 07/09/15 0228  NA 136 141  K 3.1* 3.1*  CL 102 106  CO2 26 27  GLUCOSE 229* 178*  BUN 12 16  CREATININE 1.41* 1.08*  CALCIUM 9.0 8.7*  MG  --  1.9   CBC:  Recent Labs  07/08/15 1929 07/09/15 0228  WBC 8.2 8.3  HGB 11.5* 10.2*  HCT 36.8 32.6*  MCV 86.2 86.0  PLT 274 258   Cardiac Enzymes:  Recent Labs  07/08/15 2027 07/09/15 0228  TROPONINI 0.03 0.03   CBG:  Recent Labs  07/08/15 1922  GLUCAP 213*   Urine Drug Screen: Drugs of Abuse     Component Value Date/Time   LABOPIA NONE DETECTED 07/18/2014 1010   COCAINSCRNUR NONE DETECTED 07/18/2014 1010   LABBENZ NONE DETECTED 07/18/2014 1010   AMPHETMU NONE DETECTED 07/18/2014 1010   THCU NONE DETECTED 07/18/2014 1010   LABBARB NONE DETECTED 07/18/2014 1010  Imaging results:  Dg Chest 2 View  07/08/2015  CLINICAL DATA:  Chest pain. EXAM: CHEST  2 VIEW COMPARISON:  July 18, 2014 FINDINGS: Mild cardiomegaly. The hila, mediastinum, lungs, and pleura are unremarkable. IMPRESSION: No active cardiopulmonary disease. Electronically Signed   By: Dorise Bullion III M.D   On:  07/08/2015 21:10    Other results: EKG: T wave inversions in leads I, aVL, V5, V6; the inversion in aVL is new in comparison to prior EKG from 02/2015.  Assessment & Plan by Problem: Active Problems:   Hypothyroidism   Hyperlipidemia   Essential hypertension   Coronary atherosclerosis   Depression   Chest pain   Pre-syncope   AKI (acute kidney injury) ([place])   Hypokalemia  Chest pain and pre-syncope Acute onset, pressure-like, radiates to neck and back. Pain is relieved with Nitroglycerin. Patient did have a L heart and stent placement in 02/2015. Cath showing 2-vessel CAD with chronic total occlusion of the RCA and severe stenosis of the proximal LCx, non-obstructive disease of the LAD. Her current symptoms are concerning for ACS due to possible stent occlusion. EKG showing T wave inversions in leads I, aVL, V5, V6; the inversion in aVL is new in comparison to prior EKG from 02/2015. However, Istat troponin and repeat Troponin I negative. Patient does have chronic calf pain. However, calves are symmetrical in size, not swollen, and no increased erythema or warmth. Her Wells score is 0, making PE less likely. Vitals are stable. Her pre-syncope is not likely neurological in etiology. Strength was slightly reduced in the LUE (chronic since her last stroke as per patient). It could possibly be orthostatic in nature.  -Monitor on telemetry  -Patient was given a bolus of 1L NS in the ED. Continue maintenance fluid - NS@ 125 cc/hr -Aspirin 81 mg daily -Plavix 75 mg daily  -Troponin x 2 pending  -Mag pending  -EKG in a.m. -Daily weights -Strict I&Os -Check orthostatic vitals   Hypokalemia K 3.1 on admission. -Kdur 40 meq once  -BMP in a.m.  AKI SCr 1.4 on admission, BL 0.8.  -Continue IVF  DM2 Last A1c 6.9 in 01/2015.  -Checking A1c again   HTN -Holding home anti-hypertensives for now   HLD -Lipitor 80 mg daily   Hypothyroidism -Synthroid 125 mcg daily   DVT ppx: Lovenox    Diet: HH  Dispo: Disposition is deferred at this time, awaiting improvement of current medical problems. Anticipated discharge in approximately 1-2 day(s).   The patient does have a current PCP Lucious Groves, DO) and does need an [facility] hospital follow-up appointment after discharge.  The patient does not have transportation limitations that hinder transportation to clinic appointments.  Signed: Shela Leff, MD 07/09/2015, 6:18 AM

## 2015-07-08 NOTE — ED Provider Notes (Signed)
Medical screening examination/treatment/procedure(s) were conducted as a shared visit with non-physician practitioner(s) and myself.  I personally evaluated the patient during the encounter.  Patient with some historical alternans. She was in her normal state of health today until she started having some chest pressure that radiated to Her back and neck. She took nitroglycerin and subsequently had 3 reported episodes of syncope. When she awoke she was still confused and not herself. EMS was called and brought here for evaluation. On my examination patient with normal heart rate, blood pressure mental status seems to be appropriate. She has no lower extremity edema or other evidence of fluid overload. Recently had a coronary stent placed back in January. Secondary to patient's age, chest pain and medical history along with prolonged QT on her EKG and new T-wave changes we'll plan for an observation stay in the hospital for monitoring and possible echocardiogram..      EKG Interpretation   Date/Time:  Saturday July 08 2015 19:17:06 EDT Ventricular Rate:  81 PR Interval:  192 QRS Duration: 88 QT Interval:  422 QTC Calculation: 490 R Axis:   -9 Text Interpretation:  Normal sinus rhythm Nonspecific T wave abnormality  Prolonged QT Abnormal ECG biphasic t waves in V5-V6 new since January 2017  Confirmed by [facility] MD, Quinten Allerton [phone]) on 07/08/2015 8:35:35 PM        Merrily Pew, MD 07/08/15 2056

## 2015-07-08 NOTE — ED Notes (Signed)
Family at the bedside no pain at present joking with family

## 2015-07-09 ENCOUNTER — Encounter (HOSPITAL_COMMUNITY): Payer: Self-pay | Admitting: Student

## 2015-07-09 DIAGNOSIS — I25119 Atherosclerotic heart disease of native coronary artery with unspecified angina pectoris: Secondary | ICD-10-CM

## 2015-07-09 DIAGNOSIS — Z955 Presence of coronary angioplasty implant and graft: Secondary | ICD-10-CM | POA: Diagnosis not present

## 2015-07-09 DIAGNOSIS — E785 Hyperlipidemia, unspecified: Secondary | ICD-10-CM | POA: Diagnosis not present

## 2015-07-09 DIAGNOSIS — E119 Type 2 diabetes mellitus without complications: Secondary | ICD-10-CM

## 2015-07-09 DIAGNOSIS — E876 Hypokalemia: Secondary | ICD-10-CM

## 2015-07-09 DIAGNOSIS — N179 Acute kidney failure, unspecified: Secondary | ICD-10-CM

## 2015-07-09 DIAGNOSIS — E039 Hypothyroidism, unspecified: Secondary | ICD-10-CM

## 2015-07-09 DIAGNOSIS — I200 Unstable angina: Secondary | ICD-10-CM | POA: Diagnosis not present

## 2015-07-09 DIAGNOSIS — Z87891 Personal history of nicotine dependence: Secondary | ICD-10-CM

## 2015-07-09 DIAGNOSIS — I25118 Atherosclerotic heart disease of native coronary artery with other forms of angina pectoris: Secondary | ICD-10-CM

## 2015-07-09 DIAGNOSIS — R55 Syncope and collapse: Secondary | ICD-10-CM | POA: Diagnosis not present

## 2015-07-09 DIAGNOSIS — R079 Chest pain, unspecified: Secondary | ICD-10-CM | POA: Insufficient documentation

## 2015-07-09 LAB — BASIC METABOLIC PANEL
Anion gap: 8 (ref 5–15)
Anion gap: 9 (ref 5–15)
BUN: 16 mg/dL (ref 6–20)
BUN: 17 mg/dL (ref 6–20)
CO2: 27 mmol/L (ref 22–32)
CO2: 26 mmol/L (ref 22–32)
Calcium: 8.7 mg/dL — ABNORMAL LOW (ref 8.9–10.3)
Calcium: 8.8 mg/dL — ABNORMAL LOW (ref 8.9–10.3)
Chloride: 106 mmol/L (ref 101–111)
Chloride: 107 mmol/L (ref 101–111)
Creatinine, Ser: 1.08 mg/dL — ABNORMAL HIGH (ref 0.44–1.00)
Creatinine, Ser: 0.93 mg/dL (ref 0.44–1.00)
GFR calc Af Amer: >60 mL/min (ref >60)
GFR calc Af Amer: >60 mL/min (ref >60)
GFR calc non Af Amer: 52 mL/min — ABNORMAL LOW (ref >60)
GFR calc non Af Amer: >60 mL/min (ref >60)
Glucose, Bld: 178 mg/dL — ABNORMAL HIGH (ref 65–99)
Glucose, Bld: 121 mg/dL — ABNORMAL HIGH (ref 65–99)
Potassium: 3.1 mmol/L — ABNORMAL LOW (ref 3.5–5.1)
Potassium: 3.4 mmol/L — ABNORMAL LOW (ref 3.5–5.1)
Sodium: 141 mmol/L (ref 135–145)
Sodium: 142 mmol/L (ref 135–145)

## 2015-07-09 LAB — TROPONIN I
Troponin I: 0.03 ng/mL (ref <0.031)
Troponin I: 0.03 ng/mL (ref <0.031)

## 2015-07-09 LAB — CBC
HCT: 32.6 % — ABNORMAL LOW (ref 36.0–46.0)
Hemoglobin: 10.2 g/dL — ABNORMAL LOW (ref 12.0–15.0)
MCH: 26.9 pg (ref 26.0–34.0)
MCHC: 31.3 g/dL (ref 30.0–36.0)
MCV: 86 fL (ref 78.0–100.0)
Platelets: 258 10*3/uL (ref 150–400)
RBC: 3.79 MIL/uL — ABNORMAL LOW (ref 3.87–5.11)
RDW: 14.4 % (ref 11.5–15.5)
WBC: 8.3 10*3/uL (ref 4.0–10.5)

## 2015-07-09 LAB — MAGNESIUM: Magnesium: 1.9 mg/dL (ref 1.7–2.4)

## 2015-07-09 MED ORDER — MAGNESIUM SULFATE 2 GM/50ML IV SOLN
2.0000 g | Freq: Once | INTRAVENOUS | Status: AC
Start: 2015-07-09 — End: 2015-07-09
  Administered 2015-07-09: 2 g via INTRAVENOUS
  Filled 2015-07-09: qty 50

## 2015-07-09 MED ORDER — SODIUM CHLORIDE 0.9 % WEIGHT BASED INFUSION: 1.0000 mL/kg/h | INTRAVENOUS | Status: DC

## 2015-07-09 MED ORDER — ASPIRIN 81 MG PO CHEW
81.0000 mg | CHEWABLE_TABLET | ORAL | Status: AC
  Administered 2015-07-10: 81 mg via ORAL
  Filled 2015-07-09: qty 1

## 2015-07-09 MED ORDER — CARVEDILOL 6.25 MG PO TABS
6.2500 mg | ORAL_TABLET | Freq: Two times a day (BID) | ORAL | Status: DC
  Administered 2015-07-09 – 2015-07-10 (×4): 6.25 mg via ORAL
  Filled 2015-07-09 (×4): qty 1

## 2015-07-09 MED ORDER — ASPIRIN 81 MG PO CHEW
81.0000 mg | CHEWABLE_TABLET | Freq: Every day | ORAL | Status: DC
  Administered 2015-07-09 – 2015-07-11 (×3): 81 mg via ORAL
  Filled 2015-07-09 (×5): qty 1

## 2015-07-09 MED ORDER — SODIUM CHLORIDE 0.9 % WEIGHT BASED INFUSION
3.0000 mL/kg/h | INTRAVENOUS | Status: DC
  Administered 2015-07-10: 3 mL/kg/h via INTRAVENOUS

## 2015-07-09 MED ORDER — SODIUM CHLORIDE 0.9 % IV SOLN: 250.0000 mL | INTRAVENOUS | Status: DC | PRN

## 2015-07-09 MED ORDER — SODIUM CHLORIDE 0.9% FLUSH
3.0000 mL | Freq: Two times a day (BID) | INTRAVENOUS | Status: DC
  Administered 2015-07-09 – 2015-07-10 (×2): 3 mL via INTRAVENOUS

## 2015-07-09 MED ORDER — POTASSIUM CHLORIDE CRYS ER 20 MEQ PO TBCR
40.0000 meq | EXTENDED_RELEASE_TABLET | Freq: Once | ORAL | Status: AC
  Administered 2015-07-09: 40 meq via ORAL
  Filled 2015-07-09: qty 2

## 2015-07-09 MED ORDER — SODIUM CHLORIDE 0.9% FLUSH: 3.0000 mL | INTRAVENOUS | Status: DC | PRN

## 2015-07-09 NOTE — Progress Notes (Signed)
Pt arrived on the floor, with the ED nurse. Pt alert and oriented and in no apparent distress.

## 2015-07-09 NOTE — Progress Notes (Signed)
Subjective: NAEON.  Patient currently denies chest pain.  On clarification of symptoms, patient reports feeling chest pressure/pain similar to her previous episodes of angina after walking a short distance to her car.  She had associated nausea, dizziness, diaphoresis, and SOB.  She does not remember having palpitations.  She also has also had orthopnea and 10 lb weight gain over the last month, but denies LE edema.  She will have similar chest pains when walking the ~150 feet.  However, she will sometimes have the pains at rest.    Objective: Vital signs in last 24 hours: Filed Vitals:   07/08/15 2230 07/08/15 2335 07/09/15 0000 07/09/15 0616  BP: 126/76 125/71 125/71 141/68  Pulse: 74 77 77 57  Temp:  97.9 F (36.6 C) 97.9 F (36.6 C) 98 F (36.7 C)  TempSrc:    Oral  Resp: 17   18  Height:  5' 4.5" (1.638 m)    Weight:    198 lb 4.8 oz (89.948 kg)  SpO2: 98% 98% 98% 99%   Weight change:  No intake or output data in the 24 hours ending 07/09/15 0643 Physical Exam  Constitutional: She is oriented to person, place, and time and well-developed, well-nourished, and in no distress. No distress.  Sitting on side of bed. NAD.  HENT:  Head: Normocephalic and atraumatic.  Eyes: EOM are normal. No scleral icterus.  Neck: No tracheal deviation present.  JVD could not be assessed 2/2 body habitus.  Cardiovascular: Normal rate, regular rhythm, normal heart sounds and intact distal pulses.   DP pulses 1+ bilaterally.  Pulmonary/Chest: Effort normal and breath sounds normal. No stridor. No respiratory distress. She has no wheezes. She has no rales.  No crackles appreciated.  Musculoskeletal: She exhibits no edema.  Neurological: She is alert and oriented to person, place, and time.  Skin: Skin is warm and dry. She is not diaphoretic.    Lab Results: Basic Metabolic Panel:  Recent Labs Lab 07/08/15 1929 07/09/15 0228  NA 136 141  K 3.1* 3.1*  CL 102 106  CO2 26 27  GLUCOSE 229*  178*  BUN 12 16  CREATININE 1.41* 1.08*  CALCIUM 9.0 8.7*  MG  --  1.9   Liver Function Tests: No results for input(s): AST, ALT, ALKPHOS, BILITOT, PROT, ALBUMIN in the last 168 hours. No results for input(s): LIPASE, AMYLASE in the last 168 hours. No results for input(s): AMMONIA in the last 168 hours. CBC:  Recent Labs Lab 07/08/15 1929 07/09/15 0228  WBC 8.2 8.3  HGB 11.5* 10.2*  HCT 36.8 32.6*  MCV 86.2 86.0  PLT 274 258   Cardiac Enzymes:  Recent Labs Lab 07/08/15 2027 07/09/15 0228  TROPONINI 0.03 0.03   BNP: No results for input(s): PROBNP in the last 168 hours. D-Dimer: No results for input(s): DDIMER in the last 168 hours. CBG:  Recent Labs Lab 07/08/15 1922  GLUCAP 213*   Hemoglobin A1C: No results for input(s): HGBA1C in the last 168 hours. Fasting Lipid Panel: No results for input(s): CHOL, HDL, LDLCALC, TRIG, CHOLHDL, LDLDIRECT in the last 168 hours. Thyroid Function Tests: No results for input(s): TSH, T4TOTAL, FREET4, T3FREE, THYROIDAB in the last 168 hours. Coagulation: No results for input(s): LABPROT, INR in the last 168 hours. Anemia Panel: No results for input(s): VITAMINB12, FOLATE, FERRITIN, TIBC, IRON, RETICCTPCT in the last 168 hours. Urine Drug Screen: Drugs of Abuse     Component Value Date/Time   LABOPIA NONE DETECTED 07/18/2014 1010  COCAINSCRNUR NONE DETECTED 07/18/2014 1010   LABBENZ NONE DETECTED 07/18/2014 1010   AMPHETMU NONE DETECTED 07/18/2014 1010   THCU NONE DETECTED 07/18/2014 1010   LABBARB NONE DETECTED 07/18/2014 1010    Alcohol Level: No results for input(s): ETH in the last 168 hours. Urinalysis: No results for input(s): COLORURINE, LABSPEC, PHURINE, GLUCOSEU, HGBUR, BILIRUBINUR, KETONESUR, PROTEINUR, UROBILINOGEN, NITRITE, LEUKOCYTESUR in the last 168 hours.  Invalid input(s): APPERANCEUR Misc. Labs:   Micro Results: No results found for this or any previous visit (from the past 240  hour(s)). Studies/Results: Dg Chest 2 View  07/08/2015  CLINICAL DATA:  Chest pain. EXAM: CHEST  2 VIEW COMPARISON:  July 18, 2014 FINDINGS: Mild cardiomegaly. The hila, mediastinum, lungs, and pleura are unremarkable. IMPRESSION: No active cardiopulmonary disease. Electronically Signed   By: Dorise Bullion III M.D   On: 07/08/2015 21:10   Medications: I have reviewed the patient's current medications. Scheduled Meds: . aspirin  81 mg Oral Daily  . atorvastatin  80 mg Oral Daily  . clopidogrel  75 mg Oral Q breakfast  . enoxaparin (LOVENOX) injection  40 mg Subcutaneous Q24H  . levothyroxine  125 mcg Oral QAC breakfast  . magnesium sulfate 1 - 4 g bolus IVPB  2 g Intravenous Once   Continuous Infusions: . sodium chloride 125 mL/hr at 07/09/15 0119   PRN Meds:.acetaminophen **OR** acetaminophen Assessment/Plan: Active Problems:   Hypothyroidism   Hyperlipidemia   Essential hypertension   Coronary atherosclerosis   Depression   Chest pain   Pre-syncope   AKI (acute kidney injury) ([place])   Hypokalemia  [name] is a 70 yo F with a PMHx of CAD, HTN, HLD, hypothyroidism, TIA, OSA presenting to the hospital via EMS after she was found to be confused at a grocery store.   Unstable Angina and Presyncope: Acute onset, pressure-like chest pain, relieved with Nitroglycerin, similar to previous episodes.  Pains will also occur at rest. She has extensive cardiac history, including left heart cath Jan 2017 showing 2-vessel CAD with chronic total occlusion of the RCA and severe stenosis of the proximal LCx, non-obstructive disease of the LAD. Stent was placed to LCx. Patient endorses compliance with ASA and Plavix. Symptoms concerning for ACS, but troponins negative.  With symptoms occurring with less exertion and even at rest, there is still concern for unstable angina.  Therefore, will consult cardiology, as patient may benefit from repeat stress test.  Patient's calf pain likely vascular  claudication as they are symmetric without swelling.  She may benefit from ABIs as an outpatient for further evaluation.  Wells PE score is 0, making PE less likely. Presyncope likely orthostatic in nature without other neurologic symptoms.  - Cardiology consult, appreciate rec's - [place] - Aspirin 81 mg daily - Plavix 75 mg daily  - Daily weights - Strict I&Os [ ]  TTE  Hypokalemia: K 3.1 on admission. - Kdur 40 meq once   DM2: Last A1c 6.9 in 01/2015.  [ ]  A1c  HTN: On Amlodipine, Coreg, and Lisinopril-HCTZ at home. -Holding home anti-hypertensives for now   HLD: Lipitor 80 mg daily   Hypothyroidism: Synthroid 125 mcg daily   DVT ppx: Lovenox   Diet: HH  Dispo: Disposition is deferred at this time, awaiting improvement of current medical problems.  Anticipated discharge in approximately 1-2 day(s).   The patient does have a current PCP Teresa Groves, DO) and does need an [facility] hospital follow-up appointment after discharge.  The patient does not have transportation  limitations that hinder transportation to clinic appointments.  .Services Needed at time of discharge: Y = Yes, Blank = No PT:   OT:   RN:   Equipment:   Other:     LOS: 1 day   Iline Oven, MD 07/09/2015, 6:43 AM

## 2015-07-09 NOTE — Consult Note (Signed)
Cardiology Consult    [name] MRN: [id number], DOB/AGE: September 17, 1945   Admit date: 07/08/2015 Date of Consult: 07/09/2015  Primary Physician: Lucious Groves, DO Reason for Consult: Chest Pain Primary Cardiologist: Dr. Burt Knack Requesting Provider: Dr. Beryle Beams  History of Present Illness    [name] is a 70 y.o. female with past medical history of CAD (s/p DES to OM1 in 07/2014, recent cath in 02/2015 showing CTO if RCA with collaterals present and severe stenosis of LCx with DES placed), HLD, HTN, Type 2 DM, and past TIA's who presented to [facility] ED on 07/08/2015 for a syncopal event.  Had been outside for a baby shower earlier that day and when driving she developed lightheadedness and went into a local store to sit down. She developed a central chest pressure which radiated into her back and neck and was associated with diaphoresis. A store employee called her family and told them she was going in and out of consciousness, but the patient was unsure if she actually lost consciousness. Was noted to have altered mental status while in the ED.    Initial labs showed a WBC of 8.2, stable Hgb of 11.5, and platelets of 274. BMET with K+ of 3.1 and creatinine of 1.41. Initial troponin and subsequent values have been negative. CXR with no active cardiopulmonary disease. EKG shows NSR, HR 81, with TWI in V5 and V6 (present on previous EKG on 03/01/2015).   In talking with the patient, she reports worsening chest discomfort and dyspnea on exertion for the past several months. Following her recent stent placement in 02/2015, she was doing well, ambulating over 300+ feet without any symptoms. However, over the past few months, she develops anginal symptoms just walking to her mailbox. Says it has continued to progress. Reports good compliance with her medications, including the ASA and Plavix.  Of note, in reviewing her telemetry she had over a 30+ beat run of SVT vs. VT and  episodes of 4-5 beats NSVT overnight and this AM. She denies any repeat symptoms of chest discomfort, dyspnea, or palpitations since admitted.   Past Medical History   Past Medical History  Diagnosis Date  . Cervical polyp   . Neck mass   . Cervical lymphadenopathy   . Urinary tract infection   . Hyperlipidemia     takes Crestor daily  . Coronary artery disease     Prior stent of the lCX with chronic total occlusion of a small RCA. Last cath in 2004 showing patent stent, nonobstructive disease and total RCA. EF is 65 to 70%. She has been managed medically.   . Tubular adenoma of rectum 10/24/04  . Diabetes mellitus without complication ([place])     takes Metformin daily  . Hypothyroidism     takes SYnthroid daily  . Hypertension     takes Amlodipine,Coreg,and Prinzide daily  . TIA (transient ischemic attack)   . Back pain     reason unknown  . History of colon polyps   . Depression     sees a Social worker at [facility] but no meds  . Umbilical hernia   . OSA (obstructive sleep apnea)     PT DENIES, STATES SHE WAS TOLD SHE DIDN'T HAVE OSA, NO CPAP  . PTSD (post-traumatic stress disorder)   . Renal artery stenosis ([place]) 2007  . Stroke ([place])     TIA- LAST ONE GREATER THAN 10 YEARS PER PT  . CAD (coronary artery disease)  Past Surgical History  Procedure Laterality Date  . Cardiac catheterization  2005/2006/2007/2010  . Colonoscopy  10/2004  . Appendectomy    . Ankle surgery Left     plates and screws  . Coronary angioplasty      x 1  . Umbilical hernia repair    . Umbilical hernia repair N/A 05/27/2013    Procedure: HERNIA REPAIR UMBILICAL ADULT;  Surgeon: Imogene Burn. Georgette Dover, MD;  Location: [place];  Service: General;  Laterality: N/A;  . Insertion of mesh N/A 05/27/2013    Procedure: INSERTION OF MESH;  Surgeon: Imogene Burn. Georgette Dover, MD;  Location: [place];  Service: General;  Laterality: N/A;  . Cardiac catheterization N/A 07/18/2014    Procedure: Left Heart Cath and Coronary  Angiography;  Surgeon: Lorretta Harp, MD; LAD patent, RCA 100% with L-R collaterals, OM1 80% stenosed proximal to previously placed stent, 30% ISR, EF 55%  . Renal artery stent Bilateral 2007    Dr. Albertine Patricia  . Coronary stent placement  1998    NIR 3.0 x 25 to the OM1  . Coronary stent placement  07/18/2014    SYNERGY DES 2.5X12 to the OM1   . Percutaneous coronary stent intervention (pci-s)  02/13/2015    des  to proximal circumflex  . Cardiac catheterization N/A 02/13/2015    Procedure: Left Heart Cath and Coronary Angiography;  Surgeon: Sherren Mocha, MD;  Location: [place] [facility];  Service: Cardiovascular;  Laterality: N/A;  . Cardiac catheterization Right 02/13/2015    Procedure: Coronary Stent Intervention;  Surgeon: Sherren Mocha, MD;  Location: [place] [facility];  Service: Cardiovascular;  Laterality: Right;     Allergies  No Known Allergies  Inpatient Medications    . aspirin  81 mg Oral Daily  . atorvastatin  80 mg Oral Daily  . clopidogrel  75 mg Oral Q breakfast  . enoxaparin (LOVENOX) injection  40 mg Subcutaneous Q24H  . levothyroxine  125 mcg Oral QAC breakfast  . potassium chloride  40 mEq Oral Once    Family History    Family History  Problem Relation Age of Onset  . Hypertension Mother   . Osteoarthritis Mother   . Alzheimer's disease Father   . Cancer Neg Hx   . Stroke Neg Hx   . Colon cancer Neg Hx   . Esophageal cancer Neg Hx   . Stomach cancer Neg Hx   . Rectal cancer Neg Hx     Social History    Social History   Social History  . Marital Status: Single    Spouse Name: N/A  . Number of Children: N/A  . Years of Education: N/A   Occupational History  . Not on file.   Social History Main Topics  . Smoking status: Former Research scientist (life sciences)  . Smokeless tobacco: Never Used     Comment: quit smoking about 30 yrs ago  . Alcohol Use: 0.0 oz/week    0 Standard drinks or equivalent per week     Comment: Wine on special occasions  . Drug Use: No  .  Sexual Activity: Not on file   Other Topics Concern  . Not on file   Social History Narrative     Review of Systems    General:  No chills, fever, night sweats or weight changes.  Cardiovascular:  No edema, orthopnea, palpitations, paroxysmal nocturnal dyspnea. Positive for chest pain and dyspnea on exertion.  Dermatological: No rash, lesions/masses Respiratory: No cough, dyspnea Urologic: No hematuria, dysuria  Abdominal:   No nausea, vomiting, diarrhea, bright red blood per rectum, melena, or hematemesis Neurologic:  No visual changes, wkns, changes in mental status. All other systems reviewed and are otherwise negative except as noted above.  Physical Exam    Blood pressure 141/68, pulse 57, temperature 98 F (36.7 C), temperature source Oral, resp. rate 18, height 5' 4.5" (1.638 m), weight 198 lb 4.8 oz (89.948 kg), SpO2 99 %.  General: Pleasant, African American female appearing in NAD. Psych: Normal affect. Neuro: Alert and oriented X 3. Moves all extremities spontaneously. HEENT: Normal  Neck: Supple without bruits or JVD. Lungs:  Resp regular and unlabored, CTA without wheezing or rales. Heart: RRR no s3, s4, or murmurs. Abdomen: Soft, non-tender, non-distended, BS + x 4.  Extremities: No clubbing, cyanosis or edema. DP/PT/Radials 2+ and equal bilaterally.  Labs    Troponin [facility] of Care Test)  Recent Labs  07/08/15 2021  TROPIPOC 0.01    Recent Labs  07/08/15 2027 07/09/15 0228 07/09/15 0820  TROPONINI 0.03 0.03 0.03   Lab Results  Component Value Date   WBC 8.3 07/09/2015   HGB 10.2* 07/09/2015   HCT 32.6* 07/09/2015   MCV 86.0 07/09/2015   PLT 258 07/09/2015    Recent Labs Lab 07/09/15 0820  NA 142  K 3.4*  CL 107  CO2 26  BUN 17  CREATININE 0.93  CALCIUM 8.8*  GLUCOSE 121*   Lab Results  Component Value Date   CHOL 107 01/26/2015   HDL 32* 01/26/2015   LDLCALC 48 01/26/2015   TRIG 134 01/26/2015   Lab Results  Component Value  Date   DDIMER 0.80* 01/23/2011     Radiology Studies    Dg Chest 2 View: 07/08/2015  CLINICAL DATA:  Chest pain. EXAM: CHEST  2 VIEW COMPARISON:  July 18, 2014 FINDINGS: Mild cardiomegaly. The hila, mediastinum, lungs, and pleura are unremarkable. IMPRESSION: No active cardiopulmonary disease. Electronically Signed   By: Dorise Bullion III M.D   On: 07/08/2015 21:10   EKG & Cardiac Imaging    EKG: NSR, HR 81, with TWI in V5 and V6 (present on previous EKG on 03/01/2015).   Echocardiogram: Pending  Assessment & Plan    1. Chest Pain concerning for Unstable Angina/ History of CAD - history of CAD s/p DES to OM1 in 07/2014, recent cath in 02/2015 showing CTO if RCA with collaterals present and severe stenosis of LCx with DES placed at that time.  - reports worsening chest pain and dyspnea with exertion for the past few months, ever since 3-weeks out from her stent placement. Presented with chest discomfort and possible syncopal event. - EKG shows NSR, HR 81, with TWI in V5 and V6 (present on previous EKG on 03/01/2015). Cyclic troponin values have been negative. Telemetry shows over a 30+ beat run of SVT vs. VT and episodes of 4-5 beats NSVT overnight and this AM. Her clinical presentation would be most consistent with VT. - continue ASA, statin, and high-intensity statin. Will reinitiate BB at lower dose.  - plan for cardiac catheterization tomorrow. Will need to be NPO after midnight.  2. HLD - continue statin therapy  3. HTN - BP has been 104/56 - 141/76 since admission.  - will restart PTA Coreg at lower dose of 6.25mg  BID (mildly bradycardiac this AM, HR now improved into the 70's).  4. Type 2 DM - hold Metformin 24 hours before and 48 hours following cath. - per admitting team  Signed, Erma Heritage, PA-C 07/09/2015, 11:21 AM Pager: [phone]  Attending Note:   The patient was seen and examined.  Agree with assessment and plan as noted above.  Changes made to the  above note as needed.  Patient seen and independently examined with [place] .   We discussed all aspects of the encounter. I agree with the assessment and plan as stated above.  She has exertional symptoms that are similar to her presenting symptoms before her PCI . Given her symptoms , I think we should cath her .  Her symptoms are progressive and are c/w angina   I have spent a total of 40 minutes with patient reviewing hospital  notes , telemetry, EKGs, labs and examining patient as well as establishing an assessment and plan that was discussed with the patient. > 50% of time was spent in direct patient care.    Thayer Headings, Brooke Bonito., MD, [facility] 07/09/2015, 3:12 PM [address]  [place] Pager [phone]

## 2015-07-10 ENCOUNTER — Encounter (HOSPITAL_COMMUNITY)
Admission: EM | Disposition: A | Discharge status: Home / Self Care | Payer: Self-pay | Source: Home / Self Care | Attending: Oncology

## 2015-07-10 ENCOUNTER — Other Ambulatory Visit (HOSPITAL_COMMUNITY): Payer: Self-pay

## 2015-07-10 DIAGNOSIS — E876 Hypokalemia: Secondary | ICD-10-CM | POA: Diagnosis not present

## 2015-07-10 DIAGNOSIS — I25110 Atherosclerotic heart disease of native coronary artery with unstable angina pectoris: Principal | ICD-10-CM

## 2015-07-10 DIAGNOSIS — I25118 Atherosclerotic heart disease of native coronary artery with other forms of angina pectoris: Secondary | ICD-10-CM | POA: Diagnosis not present

## 2015-07-10 DIAGNOSIS — R0789 Other chest pain: Secondary | ICD-10-CM

## 2015-07-10 DIAGNOSIS — I10 Essential (primary) hypertension: Secondary | ICD-10-CM

## 2015-07-10 DIAGNOSIS — R55 Syncope and collapse: Secondary | ICD-10-CM | POA: Diagnosis not present

## 2015-07-10 DIAGNOSIS — Z955 Presence of coronary angioplasty implant and graft: Secondary | ICD-10-CM | POA: Diagnosis not present

## 2015-07-10 DIAGNOSIS — I2510 Atherosclerotic heart disease of native coronary artery without angina pectoris: Secondary | ICD-10-CM

## 2015-07-10 HISTORY — PX: CARDIAC CATHETERIZATION: SHX172

## 2015-07-10 LAB — PROTIME-INR
INR: 1.1 (ref 0.00–1.49)
Prothrombin Time: 14.4 seconds (ref 11.6–15.2)

## 2015-07-10 LAB — URINALYSIS, ROUTINE W REFLEX MICROSCOPIC
Bilirubin Urine: NEGATIVE
Glucose, UA: NEGATIVE mg/dL
Ketones, ur: NEGATIVE mg/dL
Nitrite: NEGATIVE
Protein, ur: NEGATIVE mg/dL
Specific Gravity, Urine: 1.013 (ref 1.005–1.030)
pH: 6 (ref 5.0–8.0)

## 2015-07-10 LAB — BASIC METABOLIC PANEL
Anion gap: 4 — ABNORMAL LOW (ref 5–15)
BUN: 12 mg/dL (ref 6–20)
CO2: 29 mmol/L (ref 22–32)
Calcium: 8.4 mg/dL — ABNORMAL LOW (ref 8.9–10.3)
Chloride: 109 mmol/L (ref 101–111)
Creatinine, Ser: 0.84 mg/dL (ref 0.44–1.00)
GFR calc Af Amer: >60 mL/min (ref >60)
GFR calc non Af Amer: >60 mL/min (ref >60)
Glucose, Bld: 123 mg/dL — ABNORMAL HIGH (ref 65–99)
Potassium: 3.8 mmol/L (ref 3.5–5.1)
Sodium: 142 mmol/L (ref 135–145)

## 2015-07-10 LAB — URINE MICROSCOPIC-ADD ON

## 2015-07-10 LAB — MAGNESIUM: Magnesium: 2 mg/dL (ref 1.7–2.4)

## 2015-07-10 LAB — HEMOGLOBIN A1C
Hgb A1c MFr Bld: 7.6 % — ABNORMAL HIGH (ref 4.8–5.6)
Mean Plasma Glucose: 171 mg/dL

## 2015-07-10 LAB — TSH: TSH: 3.468 u[IU]/mL (ref 0.350–4.500)

## 2015-07-10 SURGERY — LEFT HEART CATH AND CORONARY ANGIOGRAPHY
Anesthesia: LOCAL

## 2015-07-10 MED ORDER — LABETALOL HCL 5 MG/ML IV SOLN
INTRAVENOUS | Status: AC
  Filled 2015-07-10: qty 4

## 2015-07-10 MED ORDER — HEPARIN (PORCINE) IN NACL 2-0.9 UNIT/ML-% IJ SOLN
INTRAMUSCULAR | Status: AC
  Filled 2015-07-10: qty 1500

## 2015-07-10 MED ORDER — AMLODIPINE BESYLATE 5 MG PO TABS
10.0000 mg | ORAL_TABLET | Freq: Every day | ORAL | Status: DC
  Administered 2015-07-11: 10 mg via ORAL
  Filled 2015-07-10: qty 2

## 2015-07-10 MED ORDER — FENTANYL CITRATE (PF) 100 MCG/2ML IJ SOLN
INTRAMUSCULAR | Status: DC | PRN
  Administered 2015-07-10: 50 ug via INTRAVENOUS
  Administered 2015-07-10 (×2): 25 ug via INTRAVENOUS

## 2015-07-10 MED ORDER — IOPAMIDOL (ISOVUE-370) INJECTION 76%
INTRAVENOUS | Status: AC
  Filled 2015-07-10: qty 100

## 2015-07-10 MED ORDER — NITROGLYCERIN IN D5W 200-5 MCG/ML-% IV SOLN
INTRAVENOUS | Status: AC
  Filled 2015-07-10: qty 250

## 2015-07-10 MED ORDER — NITROGLYCERIN 1 MG/10 ML FOR IR/CATH LAB
INTRA_ARTERIAL | Status: AC
  Filled 2015-07-10: qty 10

## 2015-07-10 MED ORDER — LIDOCAINE HCL (PF) 1 % IJ SOLN
INTRAMUSCULAR | Status: DC | PRN
  Administered 2015-07-10: 5 mL

## 2015-07-10 MED ORDER — HYDRALAZINE HCL 20 MG/ML IJ SOLN
INTRAMUSCULAR | Status: DC | PRN
  Administered 2015-07-10: 20 mg via INTRAVENOUS

## 2015-07-10 MED ORDER — ISOSORBIDE MONONITRATE ER 30 MG PO TB24
30.0000 mg | ORAL_TABLET | Freq: Every day | ORAL | Status: DC
  Administered 2015-07-11: 30 mg via ORAL
  Filled 2015-07-10: qty 1

## 2015-07-10 MED ORDER — SODIUM CHLORIDE 0.9 % IV SOLN: 250.0000 mL | INTRAVENOUS | Status: DC | PRN

## 2015-07-10 MED ORDER — MIDAZOLAM HCL 2 MG/2ML IJ SOLN
INTRAMUSCULAR | Status: AC
  Filled 2015-07-10: qty 2

## 2015-07-10 MED ORDER — HYDRALAZINE HCL 20 MG/ML IJ SOLN: 5.0000 mg | INTRAMUSCULAR | Status: AC | PRN

## 2015-07-10 MED ORDER — ONDANSETRON HCL 4 MG/2ML IJ SOLN
INTRAMUSCULAR | Status: AC
  Filled 2015-07-10: qty 2

## 2015-07-10 MED ORDER — SODIUM CHLORIDE 0.9% FLUSH
3.0000 mL | Freq: Two times a day (BID) | INTRAVENOUS | Status: DC
Start: 2015-07-10 — End: 2015-07-11
  Administered 2015-07-10 – 2015-07-11 (×2): 3 mL via INTRAVENOUS

## 2015-07-10 MED ORDER — FENTANYL CITRATE (PF) 100 MCG/2ML IJ SOLN
INTRAMUSCULAR | Status: AC
  Filled 2015-07-10: qty 2

## 2015-07-10 MED ORDER — VERAPAMIL HCL 2.5 MG/ML IV SOLN
INTRAVENOUS | Status: DC | PRN
  Administered 2015-07-10: 10 mL via INTRA_ARTERIAL

## 2015-07-10 MED ORDER — NITROGLYCERIN 1 MG/10 ML FOR IR/CATH LAB
INTRA_ARTERIAL | Status: DC | PRN
  Administered 2015-07-10 (×2): 200 ug via INTRA_ARTERIAL

## 2015-07-10 MED ORDER — LISINOPRIL 10 MG PO TABS
20.0000 mg | ORAL_TABLET | Freq: Every day | ORAL | Status: DC
  Administered 2015-07-11: 20 mg via ORAL
  Filled 2015-07-10: qty 2

## 2015-07-10 MED ORDER — SODIUM CHLORIDE 0.9% FLUSH: 3.0000 mL | INTRAVENOUS | Status: DC | PRN

## 2015-07-10 MED ORDER — SODIUM CHLORIDE 0.9 % WEIGHT BASED INFUSION
3.0000 mL/kg/h | INTRAVENOUS | Status: AC
  Administered 2015-07-10: 3 mL/kg/h via INTRAVENOUS

## 2015-07-10 MED ORDER — NITROGLYCERIN IN D5W 200-5 MCG/ML-% IV SOLN
INTRAVENOUS | Status: DC | PRN
  Administered 2015-07-10: 10 ug/min via INTRAVENOUS

## 2015-07-10 MED ORDER — MIDAZOLAM HCL 2 MG/2ML IJ SOLN
INTRAMUSCULAR | Status: DC | PRN
  Administered 2015-07-10 (×2): 1 mg via INTRAVENOUS

## 2015-07-10 MED ORDER — ONDANSETRON HCL 4 MG/2ML IJ SOLN
4.0000 mg | Freq: Four times a day (QID) | INTRAMUSCULAR | Status: DC | PRN
  Filled 2015-07-10: qty 2

## 2015-07-10 MED ORDER — LISINOPRIL-HYDROCHLOROTHIAZIDE 20-12.5 MG PO TABS: 1.0000 | ORAL_TABLET | Freq: Two times a day (BID) | ORAL | Status: DC

## 2015-07-10 MED ORDER — HYDRALAZINE HCL 20 MG/ML IJ SOLN
INTRAMUSCULAR | Status: AC
Start: 2015-07-10 — End: 2015-07-10
  Filled 2015-07-10: qty 1

## 2015-07-10 MED ORDER — HYDROCHLOROTHIAZIDE 12.5 MG PO CAPS
12.5000 mg | ORAL_CAPSULE | Freq: Every day | ORAL | Status: DC
  Administered 2015-07-11: 12.5 mg via ORAL
  Filled 2015-07-10: qty 1

## 2015-07-10 MED ORDER — HEPARIN SODIUM (PORCINE) 1000 UNIT/ML IJ SOLN
INTRAMUSCULAR | Status: DC | PRN
  Administered 2015-07-10: 5000 [IU] via INTRAVENOUS

## 2015-07-10 MED ORDER — LIDOCAINE HCL (PF) 1 % IJ SOLN
INTRAMUSCULAR | Status: AC
  Filled 2015-07-10: qty 30

## 2015-07-10 MED ORDER — SODIUM CHLORIDE 0.9 % IV SOLN
INTRAVENOUS | Status: DC | PRN
Start: 2015-07-10 — End: 2015-07-10
  Administered 2015-07-10: 96 mL/h via INTRAVENOUS

## 2015-07-10 MED ORDER — ONDANSETRON HCL 4 MG/2ML IJ SOLN
INTRAMUSCULAR | Status: DC | PRN
  Administered 2015-07-10: 4 mg via INTRAVENOUS

## 2015-07-10 MED ORDER — LABETALOL HCL 5 MG/ML IV SOLN
INTRAVENOUS | Status: DC | PRN
  Administered 2015-07-10 (×2): 10 mg via INTRAVENOUS

## 2015-07-10 SURGICAL SUPPLY — 9 items
CATH OPTITORQUE TIG 4.0 5F (CATHETERS) ×2 IMPLANT
DEVICE RAD COMP TR BAND LRG (VASCULAR PRODUCTS) ×2 IMPLANT
GLIDESHEATH SLEND A-KIT 6F 22G (SHEATH) ×2 IMPLANT
KIT HEART LEFT (KITS) ×2 IMPLANT
PACK CARDIAC CATHETERIZATION (CUSTOM PROCEDURE TRAY) ×2 IMPLANT
TRANSDUCER W/STOPCOCK (MISCELLANEOUS) ×2 IMPLANT
TUBING CIL FLEX 10 FLL-RA (TUBING) ×2 IMPLANT
WIRE HI TORQ VERSACORE-J 145CM (WIRE) ×2 IMPLANT
WIRE SAFE-T 1.5MM-J .035X260CM (WIRE) ×2 IMPLANT

## 2015-07-10 NOTE — Progress Notes (Signed)
Right and left groin clipped and right radial clipped also.

## 2015-07-10 NOTE — Interval H&P Note (Signed)
History and Physical Interval Note:  07/10/2015 4:34 PM  Myndi Cannizzo  has presented today for surgery, with the diagnosis of unstable angina with known CAD-PCI history.  The various methods of treatment have been discussed with the patient and family. After consideration of risks, benefits and other options for treatment, the patient has consented to  Procedure(s): Left Heart Cath and Coronary Angiography (N/A) with possible Percutaneous Coronary Intervention as a surgical intervention .  The patient's history has been reviewed, patient examined, no change in status, stable for surgery.  I have reviewed the patient's chart and labs.  Questions were answered to the patient's satisfaction.    Cath Lab Visit (complete for each Cath Lab visit)  Clinical Evaluation Leading to the Procedure:   ACS: Yes.    Non-ACS:    Anginal Classification: CCS III  Anti-ischemic medical therapy: Minimal Therapy (1 class of medications)  Non-Invasive Test Results: No non-invasive testing performed  Prior CABG: No previous CABG  TIMI SCORE  Patient Information:  TIMI Score is 4  UA/NSTEMI and intermediate-risk features (e.g., TIMI score 3?4) for short-term risk of death or nonfatal MI  Revascularization of the presumed culprit artery   A (8)  Indication: 10; Score: 8   Glenetta Hew

## 2015-07-10 NOTE — Progress Notes (Signed)
[name], female   DOB: [birth year], 71 y.o.   MRN: [id number] Medicine attending: I examined this patient today together with resident physician Dr. Viviano Simas and I concur with his evaluation and management plan which we discussed together. Cardiac monitor showed some runs of asymptomatic V. tach versus SVT. Cardiology has decided to defer stress testing and go immediately to cardiac catheterization which will happen later today. Further evaluation based on results.

## 2015-07-10 NOTE — Care Management Obs Status (Signed)
[place] NOTIFICATION   Patient Details  Name: [name] MRN: [id number] Date of Birth: 1945/11/14   Medicare Observation Status Notification Given:  Yes    Dawayne Patricia, RN 07/10/2015, 11:03 AM

## 2015-07-10 NOTE — Progress Notes (Signed)
CHG bath given 

## 2015-07-10 NOTE — H&P (View-Only) (Signed)
Cardiologist: Dr. Burt Knack Subjective:  Presented with syncope, had chest pressure radiating to her back and neck with associated diaphoresis she was unsure if she actually lost consciousness. EKG T-wave inversion V5 and V6 present on prior EKG on 03/01/15. She did report worsening chest discomfort and dyspnea for the past several months.  Following her most recent stent placement in January 2017 she was doing well without any symptoms. However, her symptoms seem to progress. She also had 4-5 beats of nonsustained ventricular tachycardia. In addition she had SVT as well.  Feeling better but still worried about why she had the experience she had. No active chest pain, no shortness of breath.  Objective:  Vital Signs in the last 24 hours: Temp:  [98.2 F (36.8 C)-98.7 F (37.1 C)] 98.3 F (36.8 C) (06/05 0459) Pulse Rate:  [50-61] 50 (06/05 0459) Resp:  [17-18] 18 (06/05 0459) BP: (144-161)/(63-82) 161/77 mmHg (06/05 0459) SpO2:  [98 %-100 %] 98 % (06/05 0459) Weight:  [212 lb (96.163 kg)-213 lb 1.6 oz (96.662 kg)] 212 lb (96.163 kg) (06/05 0317)  Intake/Output from previous day: 06/04 0701 - 06/05 0700 In: 600 [P.O.:600] Out: 1900 [Urine:1900]   Physical Exam: General: Well developed, well nourished, in no acute distress. Head:  Normocephalic and atraumatic. Lungs: Clear to auscultation and percussion. Heart: Normal S1 and S2.  No murmur, rubs or gallops.  Abdomen: soft, non-tender, positive bowel sounds. Overweight Extremities: No clubbing or cyanosis. No edema. Neurologic: Alert and oriented x 3.    Lab Results:  Recent Labs  07/08/15 1929 07/09/15 0228  WBC 8.2 8.3  HGB 11.5* 10.2*  PLT 274 258    Recent Labs  07/09/15 0820 07/10/15 0335  NA 142 142  K 3.4* 3.8  CL 107 109  CO2 26 29  GLUCOSE 121* 123*  BUN 17 12  CREATININE 0.93 0.84    Recent Labs  07/09/15 0228 07/09/15 0820  TROPONINI 0.03 0.03   Hepatic Function Panel No results for input(s):  PROT, ALBUMIN, AST, ALT, ALKPHOS, BILITOT, BILIDIR, IBILI in the last 72 hours. No results for input(s): CHOL in the last 72 hours. No results for input(s): PROTIME in the last 72 hours.  Imaging: Dg Chest 2 View  07/08/2015  CLINICAL DATA:  Chest pain. EXAM: CHEST  2 VIEW COMPARISON:  July 18, 2014 FINDINGS: Mild cardiomegaly. The hila, mediastinum, lungs, and pleura are unremarkable. IMPRESSION: No active cardiopulmonary disease. Electronically Signed   By: Dorise Bullion III M.D   On: 07/08/2015 21:10   Personally viewed.   Telemetry: 5 beats nonsustained ventricular tachycardia earlier on admission, currently sinus bradycardia no alarms Personally viewed.   EKG:  NSR, HR 81, with TWI in V5 and V6 (present on previous EKG on 03/01/2015).  Personally viewed.  Cardiac Studies:  Cardiac catheterization 02/13/15 1. 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 Normal LV function by noninvasive assessment -history of CAD s/p DES to OM1 in 07/2014, recent cath in 02/2015 showing CTO if RCA with collaterals present and severe stenosis of LCx with DES placed at that time  Meds: Scheduled Meds: . aspirin  81 mg Oral Daily  . atorvastatin  80 mg Oral Daily  . carvedilol  6.25 mg Oral BID  . clopidogrel  75 mg Oral Q breakfast  . enoxaparin (LOVENOX) injection  40 mg Subcutaneous Q24H  . levothyroxine  125 mcg Oral QAC breakfast  .  sodium chloride flush  3 mL Intravenous Q12H   Continuous Infusions: . sodium chloride Stopped (07/10/15 0633)   PRN Meds:.sodium chloride, acetaminophen **OR** acetaminophen, sodium chloride flush  Assessment/Plan:  Active Problems:   Hypothyroidism   Hyperlipidemia   Essential hypertension   Coronary atherosclerosis   Depression   Unstable angina (HCC)   Pre-syncope   AKI (acute kidney injury) (HCC)   Hypokalemia   Pain in the chest  1. Chest Pain concerning for  Unstable Angina/ History of CAD - history of CAD s/p DES to OM1 in 07/2014, recent cath in 02/2015 showing CTO if RCA with collaterals present and severe stenosis of LCx with DES placed at that time.  - reports worsening chest pain and dyspnea with exertion for the past few months, ever since 3-weeks out from her stent placement. Presented with chest discomfort and possible syncopal event. - EKG shows NSR, HR 81, with TWI in V5 and V6 (present on previous EKG on 03/01/2015). Cyclic troponin values have been negative. Telemetry shows over a 30+ beat run of SVT vs. VT and episodes of 4-5 beats NSVT overnight and this AM. Her clinical presentation would be most consistent with VT. - continue ASA, statin, and high-intensity statin. BB at lower dose. Bradycardia improved - plan for cardiac catheterization today perhaps around 3:00. Will be NPO.  2. HLD - continue statin therapy  3. HTN - BP has been 104/56 - 141/76 since admission.  - will restart PTA Coreg at lower dose of 6.25mg  BID (mildly bradycardiac, HR now improved into the 70's). Watch as this may precipitate more SVT.  4. Type 2 DM - holding Metformin 24 hours before and 48 hours following cath. - per admitting team   Candee Furbish 07/10/2015, 11:43 AM

## 2015-07-10 NOTE — Progress Notes (Signed)
Cardiologist: Dr. Burt Knack Subjective:  Presented with syncope, had chest pressure radiating to her back and neck with associated diaphoresis she was unsure if she actually lost consciousness. EKG T-wave inversion V5 and V6 present on prior EKG on 03/01/15. She did report worsening chest discomfort and dyspnea for the past several months.  Following her most recent stent placement in January 2017 she was doing well without any symptoms. However, her symptoms seem to progress. She also had 4-5 beats of nonsustained ventricular tachycardia. In addition she had SVT as well.  Feeling better but still worried about why she had the experience she had. No active chest pain, no shortness of breath.  Objective:  Vital Signs in the last 24 hours: Temp:  [98.2 F (36.8 C)-98.7 F (37.1 C)] 98.3 F (36.8 C) (06/05 0459) Pulse Rate:  [50-61] 50 (06/05 0459) Resp:  [17-18] 18 (06/05 0459) BP: (144-161)/(63-82) 161/77 mmHg (06/05 0459) SpO2:  [98 %-100 %] 98 % (06/05 0459) Weight:  [212 lb (96.163 kg)-213 lb 1.6 oz (96.662 kg)] 212 lb (96.163 kg) (06/05 0317)  Intake/Output from previous day: 06/04 0701 - 06/05 0700 In: 600 [P.O.:600] Out: 1900 [Urine:1900]   Physical Exam: General: Well developed, well nourished, in no acute distress. Head:  Normocephalic and atraumatic. Lungs: Clear to auscultation and percussion. Heart: Normal S1 and S2.  No murmur, rubs or gallops.  Abdomen: soft, non-tender, positive bowel sounds. Overweight Extremities: No clubbing or cyanosis. No edema. Neurologic: Alert and oriented x 3.    Lab Results:  Recent Labs  07/08/15 1929 07/09/15 0228  WBC 8.2 8.3  HGB 11.5* 10.2*  PLT 274 258    Recent Labs  07/09/15 0820 07/10/15 0335  NA 142 142  K 3.4* 3.8  CL 107 109  CO2 26 29  GLUCOSE 121* 123*  BUN 17 12  CREATININE 0.93 0.84    Recent Labs  07/09/15 0228 07/09/15 0820  TROPONINI 0.03 0.03   Hepatic Function Panel No results for input(s):  PROT, ALBUMIN, AST, ALT, ALKPHOS, BILITOT, BILIDIR, IBILI in the last 72 hours. No results for input(s): CHOL in the last 72 hours. No results for input(s): PROTIME in the last 72 hours.  Imaging: Dg Chest 2 View  07/08/2015  CLINICAL DATA:  Chest pain. EXAM: CHEST  2 VIEW COMPARISON:  July 18, 2014 FINDINGS: Mild cardiomegaly. The hila, mediastinum, lungs, and pleura are unremarkable. IMPRESSION: No active cardiopulmonary disease. Electronically Signed   By: Dorise Bullion III M.D   On: 07/08/2015 21:10   Personally viewed.   Telemetry: 5 beats nonsustained ventricular tachycardia earlier on admission, currently sinus bradycardia no alarms Personally viewed.   EKG:  NSR, HR 81, with TWI in V5 and V6 (present on previous EKG on 03/01/2015).  Personally viewed.  Cardiac Studies:  Cardiac catheterization 02/13/15 1. 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 Normal LV function by noninvasive assessment -history of CAD s/p DES to OM1 in 07/2014, recent cath in 02/2015 showing CTO if RCA with collaterals present and severe stenosis of LCx with DES placed at that time  Meds: Scheduled Meds: . aspirin  81 mg Oral Daily  . atorvastatin  80 mg Oral Daily  . carvedilol  6.25 mg Oral BID  . clopidogrel  75 mg Oral Q breakfast  . enoxaparin (LOVENOX) injection  40 mg Subcutaneous Q24H  . levothyroxine  125 mcg Oral QAC breakfast  .  sodium chloride flush  3 mL Intravenous Q12H   Continuous Infusions: . sodium chloride Stopped (07/10/15 0633)   PRN Meds:.sodium chloride, acetaminophen **OR** acetaminophen, sodium chloride flush  Assessment/Plan:  Active Problems:   Hypothyroidism   Hyperlipidemia   Essential hypertension   Coronary atherosclerosis   Depression   Unstable angina (HCC)   Pre-syncope   AKI (acute kidney injury) (HCC)   Hypokalemia   Pain in the chest  1. Chest Pain concerning for  Unstable Angina/ History of CAD - history of CAD s/p DES to OM1 in 07/2014, recent cath in 02/2015 showing CTO if RCA with collaterals present and severe stenosis of LCx with DES placed at that time.  - reports worsening chest pain and dyspnea with exertion for the past few months, ever since 3-weeks out from her stent placement. Presented with chest discomfort and possible syncopal event. - EKG shows NSR, HR 81, with TWI in V5 and V6 (present on previous EKG on 03/01/2015). Cyclic troponin values have been negative. Telemetry shows over a 30+ beat run of SVT vs. VT and episodes of 4-5 beats NSVT overnight and this AM. Her clinical presentation would be most consistent with VT. - continue ASA, statin, and high-intensity statin. BB at lower dose. Bradycardia improved - plan for cardiac catheterization today perhaps around 3:00. Will be NPO.  2. HLD - continue statin therapy  3. HTN - BP has been 104/56 - 141/76 since admission.  - will restart PTA Coreg at lower dose of 6.25mg  BID (mildly bradycardiac, HR now improved into the 70's). Watch as this may precipitate more SVT.  4. Type 2 DM - holding Metformin 24 hours before and 48 hours following cath. - per admitting team   Candee Furbish 07/10/2015, 11:43 AM

## 2015-07-10 NOTE — Progress Notes (Signed)
Subjective: NAEON.  Patient denies CP, SOB, or palpitations. She had minimal DOE after walking back from the bathroom.  She has no complaints currently and understands she will be going for cardiac cath.  Objective: Vital signs in last 24 hours: Filed Vitals:   07/09/15 2125 07/10/15 0026 07/10/15 0317 07/10/15 0459  BP: 160/82   161/77  Pulse: 61   50  Temp: 98.2 F (36.8 C)   98.3 F (36.8 C)  TempSrc: Oral   Oral  Resp: 18   18  Height:      Weight:  213 lb 1.6 oz (96.662 kg) 212 lb (96.163 kg)   SpO2: 100%   98%   Weight change: 14 lb 12.8 oz (6.713 kg)  Intake/Output Summary (Last 24 hours) at 07/10/15 0829 Last data filed at 07/10/15 0817  Gross per 24 hour  Intake    600 ml  Output   2200 ml  Net  -1600 ml   Physical Exam  Constitutional: She is oriented to person, place, and time and well-developed, well-nourished, and in no distress. No distress.  Sitting on side of bed. NAD.  HENT:  Head: Normocephalic and atraumatic.  Eyes: EOM are normal. No scleral icterus.  Neck: No tracheal deviation present.  JVD could not be assessed 2/2 body habitus.  Cardiovascular: Normal rate, regular rhythm and intact distal pulses.   DP pulses 1+ bilaterally.  Soft S1, normal S2.  Pulmonary/Chest: Effort normal and breath sounds normal. No stridor. No respiratory distress. She has no wheezes. She has no rales.  No crackles appreciated.  Musculoskeletal: She exhibits no edema.  Neurological: She is alert and oriented to person, place, and time.  Skin: Skin is warm and dry. She is not diaphoretic.    Lab Results: Basic Metabolic Panel:  Recent Labs Lab 07/09/15 0228 07/09/15 0820 07/10/15 0335  NA 141 142 142  K 3.1* 3.4* 3.8  CL 106 107 109  CO2 27 26 29   GLUCOSE 178* 121* 123*  BUN 16 17 12   CREATININE 1.08* 0.93 0.84  CALCIUM 8.7* 8.8* 8.4*  MG 1.9  --  2.0   Liver Function Tests: No results for input(s): AST, ALT, ALKPHOS, BILITOT, PROT, ALBUMIN in the last  168 hours. No results for input(s): LIPASE, AMYLASE in the last 168 hours. No results for input(s): AMMONIA in the last 168 hours. CBC:  Recent Labs Lab 07/08/15 1929 07/09/15 0228  WBC 8.2 8.3  HGB 11.5* 10.2*  HCT 36.8 32.6*  MCV 86.2 86.0  PLT 274 258   Cardiac Enzymes:  Recent Labs Lab 07/08/15 2027 07/09/15 0228 07/09/15 0820  TROPONINI 0.03 0.03 0.03   BNP: No results for input(s): PROBNP in the last 168 hours. D-Dimer: No results for input(s): DDIMER in the last 168 hours. CBG:  Recent Labs Lab 07/08/15 1922  GLUCAP 213*   Hemoglobin A1C: No results for input(s): HGBA1C in the last 168 hours. Fasting Lipid Panel: No results for input(s): CHOL, HDL, LDLCALC, TRIG, CHOLHDL, LDLDIRECT in the last 168 hours. Thyroid Function Tests:  Recent Labs Lab 07/10/15 0335  TSH 3.468   Coagulation:  Recent Labs Lab 07/10/15 0335  LABPROT 14.4  INR 1.10   Anemia Panel: No results for input(s): VITAMINB12, FOLATE, FERRITIN, TIBC, IRON, RETICCTPCT in the last 168 hours. Urine Drug Screen: Drugs of Abuse     Component Value Date/Time   LABOPIA NONE DETECTED 07/18/2014 1010   COCAINSCRNUR NONE DETECTED 07/18/2014 1010   LABBENZ NONE DETECTED 07/18/2014  [place] 07/18/2014 1010   THCU NONE DETECTED 07/18/2014 1010   LABBARB NONE DETECTED 07/18/2014 1010    Alcohol Level: No results for input(s): ETH in the last 168 hours. Urinalysis:  Recent Labs Lab 07/09/15 2351  COLORURINE YELLOW  LABSPEC 1.013  PHURINE 6.0  GLUCOSEU NEGATIVE  HGBUR SMALL*  BILIRUBINUR NEGATIVE  KETONESUR NEGATIVE  PROTEINUR NEGATIVE  NITRITE NEGATIVE  LEUKOCYTESUR TRACE*   Misc. Labs:   Micro Results: No results found for this or any previous visit (from the past 240 hour(s)). Studies/Results: Dg Chest 2 View  07/08/2015  CLINICAL DATA:  Chest pain. EXAM: CHEST  2 VIEW COMPARISON:  July 18, 2014 FINDINGS: Mild cardiomegaly. The hila, mediastinum,  lungs, and pleura are unremarkable. IMPRESSION: No active cardiopulmonary disease. Electronically Signed   By: Dorise Bullion III M.D   On: 07/08/2015 21:10   Medications: I have reviewed the patient's current medications. Scheduled Meds: . aspirin  81 mg Oral Daily  . atorvastatin  80 mg Oral Daily  . carvedilol  6.25 mg Oral BID  . clopidogrel  75 mg Oral Q breakfast  . enoxaparin (LOVENOX) injection  40 mg Subcutaneous Q24H  . levothyroxine  125 mcg Oral QAC breakfast  . sodium chloride flush  3 mL Intravenous Q12H   Continuous Infusions: . sodium chloride Stopped (07/10/15 0633)   PRN Meds:.sodium chloride, acetaminophen **OR** acetaminophen, sodium chloride flush Assessment/Plan: Active Problems:   Hypothyroidism   Hyperlipidemia   Essential hypertension   Coronary atherosclerosis   Depression   Unstable angina (HCC)   Pre-syncope   AKI (acute kidney injury) ([place])   Hypokalemia   Pain in the chest  Ms. Schalk is a 70 yo F with a PMHx of CAD, HTN, HLD, hypothyroidism, TIA, OSA presenting to the hospital via EMS after she was found to be confused at a grocery store.   Unstable Angina and Presyncope: She has extensive cardiac history, including left heart cath Jan 2017 showing 2-vessel CAD with chronic total occlusion of the RCA and severe stenosis of the proximal LCx, non-obstructive disease of the LAD. Stent was placed to LCx. Patient endorses compliance with ASA and Plavix. Symptoms concerning for ACS, but troponins negative. However, telemetry demonstrates multiple runs of NSVT vs VT.  Cardiology restarted Coreg at decreased dose and is planning on cardiac catheterization today.  - Cardiology following, appreciate rec's - Teley - Aspirin 81 mg daily - Plavix 75 mg daily  - Daily weights - Strict I&Os - Coreg 6.25 mg BID [ ]  Cardiac cath [ ]  TTE  Hypokalemia: K 3.1 on admission. - Kdur 40 meq once   DM2: Last A1c 6.9 in 01/2015.  [ ]  A1c  HTN: On Amlodipine,  Coreg, and Lisinopril-HCTZ at home. -Holding home anti-hypertensives for now   HLD: Lipitor 80 mg daily   Hypothyroidism: Synthroid 125 mcg daily   DVT ppx: Lovenox   Diet: HH  Dispo: Disposition is deferred at this time, awaiting improvement of current medical problems.  Anticipated discharge in approximately 1-2 day(s).   The patient does have a current PCP Lucious Groves, DO) and does need an [facility] hospital follow-up appointment after discharge.  The patient does not have transportation limitations that hinder transportation to clinic appointments.  .Services Needed at time of discharge: Y = Yes, Blank = No PT:   OT:   RN:   Equipment:   Other:     LOS: 2 days   Iline Oven, MD  07/10/2015, 8:29 AM

## 2015-07-11 ENCOUNTER — Observation Stay (HOSPITAL_BASED_OUTPATIENT_CLINIC_OR_DEPARTMENT_OTHER): Payer: Medicare Other

## 2015-07-11 ENCOUNTER — Encounter (HOSPITAL_COMMUNITY): Payer: Self-pay | Admitting: Cardiology

## 2015-07-11 DIAGNOSIS — R079 Chest pain, unspecified: Secondary | ICD-10-CM

## 2015-07-11 DIAGNOSIS — R55 Syncope and collapse: Secondary | ICD-10-CM | POA: Diagnosis not present

## 2015-07-11 DIAGNOSIS — I25110 Atherosclerotic heart disease of native coronary artery with unstable angina pectoris: Secondary | ICD-10-CM | POA: Diagnosis not present

## 2015-07-11 DIAGNOSIS — I209 Angina pectoris, unspecified: Secondary | ICD-10-CM

## 2015-07-11 DIAGNOSIS — E119 Type 2 diabetes mellitus without complications: Secondary | ICD-10-CM | POA: Diagnosis not present

## 2015-07-11 DIAGNOSIS — Z955 Presence of coronary angioplasty implant and graft: Secondary | ICD-10-CM | POA: Diagnosis not present

## 2015-07-11 DIAGNOSIS — I10 Essential (primary) hypertension: Secondary | ICD-10-CM | POA: Diagnosis not present

## 2015-07-11 DIAGNOSIS — I2510 Atherosclerotic heart disease of native coronary artery without angina pectoris: Secondary | ICD-10-CM | POA: Diagnosis not present

## 2015-07-11 DIAGNOSIS — I2582 Chronic total occlusion of coronary artery: Secondary | ICD-10-CM | POA: Diagnosis not present

## 2015-07-11 DIAGNOSIS — I25118 Atherosclerotic heart disease of native coronary artery with other forms of angina pectoris: Secondary | ICD-10-CM | POA: Diagnosis not present

## 2015-07-11 LAB — ECHOCARDIOGRAM COMPLETE
E decel time: 289 msec
E/e' ratio: 19.53
FS: 45 % — AB (ref 28–44)
Height: 64.5 in
IVS/LV PW RATIO, ED: 0.96
LA ID, A-P, ES: 37 mm
LA diam end sys: 37 mm
LA diam index: 1.73 cm/m2
LA vol A4C: 73 ml
LA vol index: 31.8 mL/m2
LA vol: 68 mL
LV E/e' medial: 19.53
LV E/e'average: 19.53
LV PW d: 14.1 mm — AB (ref 0.6–1.1)
LV dias vol index: 31 mL/m2
LV dias vol: 67 mL (ref 46–106)
LV e' LATERAL: 3.84 cm/s
LV sys vol index: 14 mL/m2
LV sys vol: 29 mL (ref 14–42)
LVOT area: 3.14 cm2
LVOT diameter: 20 mm
MV Dec: 289
MV Peak grad: 2 mmHg
MV pk A vel: 126 m/s
MV pk E vel: 75 m/s
Simpson's disk: 57
Stroke v: 38 ml
TDI e' lateral: 3.84
TDI e' medial: 3.62
Weight: 3344 oz

## 2015-07-11 LAB — BASIC METABOLIC PANEL
Anion gap: 7 (ref 5–15)
BUN: 12 mg/dL (ref 6–20)
CO2: 25 mmol/L (ref 22–32)
Calcium: 8.4 mg/dL — ABNORMAL LOW (ref 8.9–10.3)
Chloride: 106 mmol/L (ref 101–111)
Creatinine, Ser: 0.84 mg/dL (ref 0.44–1.00)
GFR calc Af Amer: >60 mL/min (ref >60)
GFR calc non Af Amer: >60 mL/min (ref >60)
Glucose, Bld: 147 mg/dL — ABNORMAL HIGH (ref 65–99)
Potassium: 3.8 mmol/L (ref 3.5–5.1)
Sodium: 138 mmol/L (ref 135–145)

## 2015-07-11 MED ORDER — CARVEDILOL 12.5 MG PO TABS
12.5000 mg | ORAL_TABLET | Freq: Two times a day (BID) | ORAL | Status: DC
  Administered 2015-07-11: 12.5 mg via ORAL
  Filled 2015-07-11: qty 1

## 2015-07-11 MED ORDER — LEVOTHYROXINE SODIUM 125 MCG PO TABS
125.0000 ug | ORAL_TABLET | Freq: Every day | ORAL | Status: DC
  Administered 2015-07-11: 125 ug via ORAL
  Filled 2015-07-11 (×2): qty 1

## 2015-07-11 MED ORDER — CARVEDILOL 12.5 MG PO TABS: 12.5000 mg | ORAL_TABLET | Freq: Two times a day (BID) | ORAL | Status: DC

## 2015-07-11 MED ORDER — CARVEDILOL 6.25 MG PO TABS: 6.2500 mg | ORAL_TABLET | Freq: Two times a day (BID) | ORAL | Status: DC

## 2015-07-11 NOTE — Discharge Instructions (Signed)
1. We are decreasing your dose of Carvedilol.  Pick up your NEW prescription for Carvedilol 6.25 mg TWICE daily.  2. Do NOT resume Metformin until 6/8. 3. Follow up with PCP and cardiologist after discharge.   Chest Pain Observation It is often hard to give a specific diagnosis for the cause of chest pain. Among other possibilities your symptoms might be caused by inadequate oxygen delivery to your heart (angina). Angina that is not treated or evaluated can lead to a heart attack (myocardial infarction) or death. Blood tests, electrocardiograms, and X-rays may have been done to help determine a possible cause of your chest pain. After evaluation and observation, your health care provider has determined that it is unlikely your pain was caused by an unstable condition that requires hospitalization. However, a full evaluation of your pain may need to be completed, with additional diagnostic testing as directed. It is very important to keep your follow-up appointments. Not keeping your follow-up appointments could result in permanent heart damage, disability, or death. If there is any problem keeping your follow-up appointments, you must call your health care provider. HOME CARE INSTRUCTIONS  Due to the slight chance that your pain could be angina, it is important to follow your health care provider's treatment plan and also maintain a healthy lifestyle:  Maintain or work toward achieving a healthy weight.  Stay physically active and exercise regularly.  Decrease your salt intake.  Eat a balanced, healthy diet. Talk to a dietitian to learn about heart-healthy foods.  Increase your fiber intake by including whole grains, vegetables, fruits, and nuts in your diet.  Avoid situations that cause stress, anger, or depression.  Take medicines as advised by your health care provider. Report any side effects to your health care provider. Do not stop medicines or adjust the dosages on your own.  Quit  smoking. Do not use nicotine patches or gum until you check with your health care provider.  Keep your blood pressure, blood sugar, and cholesterol levels within normal limits.  Limit alcohol intake to no more than 1 drink per day for women who are not pregnant and 2 drinks per day for men.  Do not abuse drugs. SEEK IMMEDIATE MEDICAL CARE IF: You have severe chest pain or pressure which may include symptoms such as:  You feel pain or pressure in your arms, neck, jaw, or back.  You have severe back or abdominal pain, feel sick to your stomach (nauseous), or throw up (vomit).  You are sweating profusely.  You are having a fast or irregular heartbeat.  You feel short of breath while at rest.  You notice increasing shortness of breath during rest, sleep, or with activity.  You have chest pain that does not get better after rest or after taking your usual medicine.  You wake from sleep with chest pain.  You are unable to sleep because you cannot breathe.  You develop a frequent cough or you are coughing up blood.  You feel dizzy, faint, or experience extreme fatigue.  You develop severe weakness, dizziness, fainting, or chills. Any of these symptoms may represent a serious problem that is an emergency. Do not wait to see if the symptoms will go away. Call your local emergency services (911 in the [place] Do not drive yourself to the hospital. MAKE SURE YOU:  Understand these instructions.  Will watch your condition.  Will get help right away if you are not doing well or get worse.   This information is not  intended to replace advice given to you by your health care provider. Make sure you discuss any questions you have with your health care provider.   Document Released: 02/23/2010 Document Revised: 01/26/2013 Document Reviewed: 07/23/2012 Elsevier Interactive Patient Education [employer].

## 2015-07-11 NOTE — Progress Notes (Signed)
Subjective: [name].  Patient tolerated heart catheterization without complication.  She denies CP, SOB, dizziness, or presyncope. She is eager to go home.  Objective: Vital signs in last 24 hours: Filed Vitals:   07/10/15 2100 07/10/15 2119 07/11/15 0456 07/11/15 0538  BP: 163/76 122/68 142/70   Pulse: 73 74 73   Temp:  98 F (36.7 C) 98.3 F (36.8 C) 98.5 F (36.9 C)  TempSrc:  Oral Oral Oral  Resp:  18 18 18   Height:      Weight:      SpO2:  99% 98%    Weight change:   Intake/Output Summary (Last 24 hours) at 07/11/15 [id number] Last data filed at 07/10/15 0817  Gross per 24 hour  Intake      0 ml  Output    300 ml  Net   -300 ml   Physical Exam  Constitutional: She is oriented to person, place, and time and well-developed, well-nourished, and in no distress. No distress.  Sitting on side of bed. NAD.  HENT:  Head: Normocephalic and atraumatic.  Eyes: EOM are normal. No scleral icterus.  Neck: No tracheal deviation present.  JVD could not be assessed 2/2 body habitus.  Cardiovascular: Normal rate and regular rhythm.   Grade II/VI systolic murmur at RUSB.  Pulmonary/Chest: Effort normal and breath sounds normal. No stridor. No respiratory distress. She has no wheezes. She has no rales.  No crackles appreciated.  Musculoskeletal: She exhibits no edema.  Neurological: She is alert and oriented to person, place, and time.  Skin: Skin is warm and dry. She is not diaphoretic.    Lab Results: Basic Metabolic Panel:  Recent Labs Lab 07/09/15 0228  07/10/15 0335 07/11/15 0353  NA 141  < > 142 138  K 3.1*  < > 3.8 3.8  CL 106  < > 109 106  CO2 27  < > 29 25  GLUCOSE 178*  < > 123* 147*  BUN 16  < > 12 12  CREATININE 1.08*  < > 0.84 0.84  CALCIUM 8.7*  < > 8.4* 8.4*  MG 1.9  --  2.0  --   < > = values in this interval not displayed. Liver Function Tests: No results for input(s): AST, ALT, ALKPHOS, BILITOT, PROT, ALBUMIN in the last 168 hours. No results for  input(s): LIPASE, AMYLASE in the last 168 hours. No results for input(s): AMMONIA in the last 168 hours. CBC:  Recent Labs Lab 07/08/15 1929 07/09/15 0228  WBC 8.2 8.3  HGB 11.5* 10.2*  HCT 36.8 32.6*  MCV 86.2 86.0  PLT 274 258   Cardiac Enzymes:  Recent Labs Lab 07/08/15 2027 07/09/15 0228 07/09/15 0820  TROPONINI 0.03 0.03 0.03   BNP: No results for input(s): PROBNP in the last 168 hours. D-Dimer: No results for input(s): DDIMER in the last 168 hours. CBG:  Recent Labs Lab 07/08/15 1922  GLUCAP 213*   Hemoglobin A1C:  Recent Labs Lab 07/09/15 0228  HGBA1C 7.6*   Fasting Lipid Panel: No results for input(s): CHOL, HDL, LDLCALC, TRIG, CHOLHDL, LDLDIRECT in the last 168 hours. Thyroid Function Tests:  Recent Labs Lab 07/10/15 0335  TSH 3.468   Coagulation:  Recent Labs Lab 07/10/15 0335  LABPROT 14.4  INR 1.10   Anemia Panel: No results for input(s): VITAMINB12, FOLATE, FERRITIN, TIBC, IRON, RETICCTPCT in the last 168 hours. Urine Drug Screen: Drugs of Abuse     Component Value Date/Time   LABOPIA NONE DETECTED 07/18/2014  [place] 07/18/2014 1010   LABBENZ NONE DETECTED 07/18/2014 1010   AMPHETMU NONE DETECTED 07/18/2014 1010   THCU NONE DETECTED 07/18/2014 1010   LABBARB NONE DETECTED 07/18/2014 1010    Alcohol Level: No results for input(s): ETH in the last 168 hours. Urinalysis:  Recent Labs Lab 07/09/15 2351  COLORURINE YELLOW  LABSPEC 1.013  PHURINE 6.0  GLUCOSEU NEGATIVE  HGBUR SMALL*  BILIRUBINUR NEGATIVE  KETONESUR NEGATIVE  PROTEINUR NEGATIVE  NITRITE NEGATIVE  LEUKOCYTESUR TRACE*   Misc. Labs:   Micro Results: No results found for this or any previous visit (from the past 240 hour(s)). Studies/Results: No results found. Medications: I have reviewed the patient's current medications. Scheduled Meds: . amLODipine  10 mg Oral Daily  . aspirin  81 mg Oral Daily  . atorvastatin  80 mg  Oral Daily  . carvedilol  6.25 mg Oral BID  . clopidogrel  75 mg Oral Q breakfast  . enoxaparin (LOVENOX) injection  40 mg Subcutaneous Q24H  . lisinopril  20 mg Oral Daily   And  . hydrochlorothiazide  12.5 mg Oral Daily  . isosorbide mononitrate  30 mg Oral Daily  . levothyroxine  125 mcg Oral QAC breakfast  . sodium chloride flush  3 mL Intravenous Q12H   Continuous Infusions:   PRN Meds:.sodium chloride, acetaminophen **OR** acetaminophen, hydrALAZINE, ondansetron (ZOFRAN) IV, sodium chloride flush Assessment/Plan: Active Problems:   Hypothyroidism   Hyperlipidemia   Essential hypertension   Coronary atherosclerosis   Depression   Unstable angina (HCC)   Pre-syncope   AKI (acute kidney injury) ([place])   Hypokalemia   Pain in the chest  Ms. Argentieri is a 70 yo F with a PMHx of CAD, HTN, HLD, hypothyroidism, TIA, OSA presenting to the hospital via EMS after she was found to be confused at a grocery store.   Unstable Angina and Presyncope: She has extensive cardiac history, including left heart cath Jan 2017 showing 2-vessel CAD with chronic total occlusion of the RCA and severe stenosis of the proximal LCx, non-obstructive disease of the LAD. Stent was placed to LCx. Patient endorses compliance with ASA and Plavix. Symptoms concerning for ACS, but troponins negative. Left heart cath shows continued RCA stenosis, 25% stenosis of prox-mid LAD, widely patent OM1 stent, and elevated LVEDP at 16mmHg.  Antihypertensives were restarted.  Will discharge on decreased dose of Carvedilol per Cardiology's last note and have patient follow up as an outpatient. - Cardiology following, appreciate rec's - Teley - Aspirin 81 mg daily - Plavix 75 mg daily  - Daily weights - Strict I&Os - Amlodipine 10 mg - Coreg 6.25 mg BID - Lisinopril-HCTZ 20-12.5 mg [ ]  TTE  Hypokalemia: K 3.1 on admission. - Kdur 40 meq once   DM2: Last A1c 6.9 in 01/2015.  - A1c 7.6%  HTN: On Amlodipine, Coreg, and  Lisinopril-HCTZ at home. - Amlodipine 10 mg - Coreg 6.25 mg BID - Lisinopril-HCTZ 20-12.5 mg  HLD: Lipitor 80 mg daily   Hypothyroidism: Synthroid 125 mcg daily   DVT ppx: Lovenox   Diet: HH  Dispo: Disposition is deferred at this time, awaiting improvement of current medical problems.  Anticipated discharge in approximately 1-2 day(s).   The patient does have a current PCP Lucious Groves, DO) and does need an [facility] hospital follow-up appointment after discharge.  The patient does not have transportation limitations that hinder transportation to clinic appointments.  .Services Needed at time of discharge: Y = Yes, Blank =  No PT:   OT:   RN:   Equipment:   Other:     LOS: 3 days   Iline Oven, MD 07/11/2015, 6:32 AM

## 2015-07-11 NOTE — Discharge Summary (Signed)
Name: [name] MRN: [id number] DOB: [birth year] 70 y.o. PCP: Lucious Groves, DO  Date of Admission: 07/08/2015  7:28 PM Date of Discharge: 07/11/2015 Attending Physician: Annia Belt, MD  Discharge Diagnosis: 1. Chest Pain, Presyncope, NSVT   Active Problems:   Hypothyroidism   Hyperlipidemia   Essential hypertension   Coronary atherosclerosis   Depression   Unstable angina (HCC)   Pre-syncope   AKI (acute kidney injury) (HCC)   Hypokalemia   Pain in the chest  Discharge Medications:   Medication List    TAKE these medications        amLODipine 10 MG tablet  Commonly known as:  NORVASC  Take 1 tablet (10 mg total) by mouth daily.     aspirin 81 MG tablet  Take 1 tablet (81 mg total) by mouth daily.     atorvastatin 80 MG tablet  Commonly known as:  LIPITOR  Take 1 tablet (80 mg total) by mouth daily.     carvedilol 6.25 MG tablet  Commonly known as:  COREG  Take 1 tablet (6.25 mg total) by mouth 2 (two) times daily with a meal.     clopidogrel 75 MG tablet  Commonly known as:  PLAVIX  Take 1 tablet (75 mg total) by mouth daily with breakfast.     isosorbide mononitrate 30 MG 24 hr tablet  Commonly known as:  IMDUR  TAKE ONE TABLET BY MOUTH ONCE DAILY     levothyroxine 125 MCG tablet  Commonly known as:  SYNTHROID, LEVOTHROID  Take 1 tablet (125 mcg total) by mouth daily before breakfast.     lisinopril-hydrochlorothiazide 20-12.5 MG tablet  Commonly known as:  PRINZIDE,ZESTORETIC  Take 1 tablet by mouth 2 (two) times daily.     metFORMIN 500 MG 24 hr tablet  Commonly known as:  GLUCOPHAGE XR  Take 1 tablet (500 mg total) by mouth daily with breakfast.     nitroGLYCERIN 0.4 MG SL tablet  Commonly known as:  NITROSTAT  Place 1 tablet (0.4 mg total) under the tongue every 5 (five) minutes as needed for chest pain. Up to 3 doses        Disposition and follow-up:   [name] was discharged from [facility] in Good  condition.  At the hospital follow up visit please address:  1.  Chest pain, Coreg Dosing and NSVT, medication adherence, cardiology follow up  2.  Labs / imaging needed at time of follow-up: none  3.  Pending labs/ test needing follow-up: TTE  Follow-up Appointments: Follow-up Information    Follow up with Lucious Groves, DO. Go on 07/25/2015.   Specialty:  Internal Medicine   Why:  10:15a   Contact information:   [place] [address] [phone]       Follow up with Cecilie Kicks, NP. Go on 07/19/2015.   Specialties:  Cardiology, Radiology   Why:  1:30 pm   Contact information:   [address] [place] [address] [phone]       Discharge Instructions: Discharge Instructions    Call MD for:  difficulty breathing, headache or visual disturbances    Complete by:  As directed      Diet - low sodium heart healthy    Complete by:  As directed      Increase activity slowly    Complete by:  As directed  Consultations: Treatment Team:  Rounding Lbcardiology, MD  Procedures Performed:  Dg Chest 2 View  07/08/2015  CLINICAL DATA:  Chest pain. EXAM: CHEST  2 VIEW COMPARISON:  July 18, 2014 FINDINGS: Mild cardiomegaly. The hila, mediastinum, lungs, and pleura are unremarkable. IMPRESSION: No active cardiopulmonary disease. Electronically Signed   By: Dorise Bullion III M.D   On: 07/08/2015 21:10    2D Echo: pending  Cardiac Cath:   Prox RCA lesion, 100% stenosed. Known chronic occlusion - not imaged  Prox LAD to Mid LAD lesion, 25% stenosed. stable  Widely patent Circumflex-OM1 stent  Severe systemic hypertension with elevated LVEDP of 40 mmHg.   I don't see any angiographic evidence of any new disease compared to last catheterization. The stent is widely patent. She did have an LVEDP - however she had not been receiving her ACE inhibitor or calcium channel blocker while in the hospital.  Plan: Return to nursing unit  for TR band removal Restart home blood pressure medications Expect that she should be discharged tomorrow from a cardiac standpoint. Otherwise continue current home medications.  Admission HPI: Patient is a 70 yo F with a PMHx of CAD, HTN, HLD, hypothyroidism, TIA, OSA presenting to the hospital via EMS after she was found to be confused at a grocery store. Patient states she was in her usual state of health until she was visiting someone today. States as she was getting out of her car, she experienced chest pressure that radiated to her back and neck, diaphoresis, nausea, and SOB. She took one dose of nitroglycerin and felt better afterwards. Patient then went to a grocery store where people though she was confused and about to faint. States she was feeling lightheaded and does not remember anything else. States her left hand "feels like it is asleep" intermittently for the past 3-4 weeks and is chronically weaker than her right hand due to a stroke in the past. Denies having any lower extremity weakness or numbness. Reports taking Aspirin and Plavix daily. Denies any history of seizures. States she experiences ringing in her ears on occasion. Patient denies any history of recent travel or leg swelling. States she has chronic pain in her lower legs and calves. Denies any symptoms of orthopnea. Reports having a chronic cough. States she has been feeling tired and "cranky" for several days.   Hospital Course by problem list: Active Problems:   Hypothyroidism   Hyperlipidemia   Essential hypertension   Coronary atherosclerosis   Depression   Unstable angina (HCC)   Pre-syncope   AKI (acute kidney injury) ([place])   Hypokalemia   Pain in the chest   Chest Pain, Presyncope, NSVT: Patient presented with intermittent chest pain and presyncope, concerning for ACS.  Troponins were negative, but patient's symptoms were concerning still concerning for unstable angina.  She underwent coronary catheterization,  which demonstrated continued RCA stenosis and patient OM1 stent.  Her home antihypertensives were restarted, except for Carvedilol, that was decreased to 6.25 mg BID due to bradycardia on presentation.  During hospitalization, telemetry also noted a 30 beat run of NSVT vs VT while not on her home beta blocker.  At follow up, please address whether patient's Carvedilol should be increased to her original home dosing or whether she needs prolonged cardiac monitoring.  The reading of her echocardiogram was pending at time of discharge.  Discharge Vitals:   BP 142/70 mmHg  Pulse 73  Temp(Src) 98.5 F (36.9 C) (Oral)  Resp 18  Ht 5' 4.5" (  1.638 m)  Wt 209 lb (94.802 kg)  BMI 35.33 kg/m2  SpO2 100%  Discharge Labs:  Results for orders placed or performed during the hospital encounter of 07/08/15 (from the past 24 hour(s))  Basic metabolic panel     Status: Abnormal   Collection Time: 07/11/15  3:53 AM  Result Value Ref Range   Sodium 138 135 - 145 mmol/L   Potassium 3.8 3.5 - 5.1 mmol/L   Chloride 106 101 - 111 mmol/L   CO2 25 22 - 32 mmol/L   Glucose, Bld 147 (H) 65 - 99 mg/dL   BUN 12 6 - 20 mg/dL   Creatinine, Ser 0.84 0.44 - 1.00 mg/dL   Calcium 8.4 (L) 8.9 - 10.3 mg/dL   GFR calc non Af Amer >60 >60 mL/min   GFR calc Af Amer >60 >60 mL/min   Anion gap 7 5 - 15    Signed: Iline Oven, MD 07/11/2015, 11:15 AM    Services Ordered on Discharge: none Equipment Ordered on Discharge: none

## 2015-07-11 NOTE — Progress Notes (Signed)
Echocardiogram 2D Echocardiogram has been performed.  Joelene Millin 07/11/2015, 12:18 PM

## 2015-07-11 NOTE — Progress Notes (Signed)
Discharged per MD order, D/C instructions explained to the patient. Indicated she understands and IV taken out.

## 2015-07-11 NOTE — Progress Notes (Signed)
Cardiologist: Dr. Burt Knack Subjective:  Presented with syncope, had chest pressure radiating to her back and neck with associated diaphoresis she was unsure if she actually lost consciousness. EKG T-wave inversion V5 and V6 present on prior EKG on 03/01/15. She did report worsening chest discomfort and dyspnea for the past several months.  Following her most recent stent placement in January 2017 she was doing well without any symptoms. However, her symptoms seem to progress. She also had 4-5 beats of nonsustained ventricular tachycardia. In addition she had SVT as well.  Cardiac catheterization on this admission showed no changes from prior. Reassurance.  Feeling well this morning. No chest pain, no shortness of breath.  Objective:  Vital Signs in the last 24 hours: Temp:  [97.8 F (36.6 C)-98.5 F (36.9 C)] 98.5 F (36.9 C) (06/06 0538) Pulse Rate:  [62-80] 73 (06/06 0456) Resp:  [11-32] 18 (06/06 0538) BP: (108-227)/(58-114) 142/70 mmHg (06/06 0456) SpO2:  [96 %-100 %] 100 % (06/06 1031) Weight:  [209 lb (94.802 kg)] 209 lb (94.802 kg) (06/06 0500)  Intake/Output from previous day: 06/05 0701 - 06/06 0700 In: -  Out: 300 [Urine:300]   Physical Exam: General: Well developed, well nourished, in no acute distress. Head:  Normocephalic and atraumatic. Lungs: Clear to auscultation and percussion. Heart: Normal S1 and S2.  No murmur, rubs or gallops.  Abdomen: soft, non-tender, positive bowel sounds. Overweight Extremities: No clubbing or cyanosis. No edema. Neurologic: Alert and oriented x 3.    Lab Results:  Recent Labs  07/08/15 1929 07/09/15 0228  WBC 8.2 8.3  HGB 11.5* 10.2*  PLT 274 258    Recent Labs  07/10/15 0335 07/11/15 0353  NA 142 138  K 3.8 3.8  CL 109 106  CO2 29 25  GLUCOSE 123* 147*  BUN 12 12  CREATININE 0.84 0.84    Recent Labs  07/09/15 0228 07/09/15 0820  TROPONINI 0.03 0.03   Hepatic Function Panel No results for input(s): PROT,  ALBUMIN, AST, ALT, ALKPHOS, BILITOT, BILIDIR, IBILI in the last 72 hours. No results for input(s): CHOL in the last 72 hours. No results for input(s): PROTIME in the last 72 hours.  Imaging: No results found. Personally viewed.   Telemetry: 5 beats nonsustained ventricular tachycardia earlier on admission, currently sinus bradycardia no alarms, 5-6 beats of irregular wide-complex rhythm, matches up with subsequent PVC. Personally viewed.   EKG:  NSR, HR 81, with TWI in V5 and V6 (present on previous EKG on 03/01/2015).  Personally viewed.  Cardiac Studies:  Cardiac catheterization 02/13/15 1. 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 Normal LV function by noninvasive assessment -history of CAD s/p DES to OM1 in 07/2014, recent cath in 02/2015 showing CTO if RCA with collaterals present and severe stenosis of LCx with DES placed at that time  Current hospitalization cardiac catheterization on 07/10/15  Prox RCA lesion, 100% stenosed. Known chronic occlusion - not imaged  Prox LAD to Mid LAD lesion, 25% stenosed. stable  Widely patent Circumflex-OM1 stent  Severe systemic hypertension with elevated LVEDP of 40 mmHg.   I don't see any angiographic evidence of any new disease compared to last catheterization. The stent is widely patent. She did have an LVEDP - however she had not been receiving her ACE inhibitor or calcium channel blocker while in the hospital.  Plan: Return to nursing unit for TR band removal Restart home  blood pressure medications Expect that she should be discharged tomorrow from a cardiac standpoint. Otherwise continue current home medications.   Glenetta Hew, M.D., M.S.  Meds: Scheduled Meds: . amLODipine  10 mg Oral Daily  . aspirin  81 mg Oral Daily  . atorvastatin  80 mg Oral Daily  . carvedilol  6.25 mg Oral BID WC  . clopidogrel  75 mg Oral Q breakfast  .  enoxaparin (LOVENOX) injection  40 mg Subcutaneous Q24H  . lisinopril  20 mg Oral Daily   And  . hydrochlorothiazide  12.5 mg Oral Daily  . isosorbide mononitrate  30 mg Oral Daily  . levothyroxine  125 mcg Oral QAC breakfast  . sodium chloride flush  3 mL Intravenous Q12H   Continuous Infusions:   PRN Meds:.sodium chloride, acetaminophen **OR** acetaminophen, ondansetron (ZOFRAN) IV, sodium chloride flush  Assessment/Plan:  Active Problems:   Hypothyroidism   Hyperlipidemia   Essential hypertension   Coronary atherosclerosis   Depression   Unstable angina (HCC)   Pre-syncope   AKI (acute kidney injury) ([place])   Hypokalemia   Pain in the chest  1. Chest Pain  History of CAD - history of CAD s/p DES to OM1 in 07/2014, recent cath in 02/2015 showing CTO if RCA with collaterals present and severe stenosis of LCx with DES placed at that time.  - reports worsening chest pain and dyspnea with exertion for the past few months, ever since 3-weeks out from her stent placement. Presented with chest discomfort and possible syncopal event. - Reassuring cardiac catheterization on 07/10/15 showing no change in coronary anatomy. Continue with current medical management. - EKG shows NSR, HR 81, with TWI in V5 and V6 (present on previous EKG on 03/01/2015). Cyclic troponin values have been negative. Telemetry previously shows over a 30+ beat run of SVT vs. VT and episodes of 4-5 beats NSVT   - continue ASA, statin, and high-intensity statin. BB at lower dose. Bradycardia improved.  - Recent ejection fraction on nuclear stress test 01/31/15 was 63%.   2. HLD - continue statin therapy  3. HTN - BP has been 104/56 - 141/76 since admission.  - will restart PTA Coreg at lower dose of 6.25mg  BID (mildly bradycardiac, HR now improved into the 70's). Watch as this may precipitate more SVT.  4. Type 2 DM - holding Metformin 24 hours before and 48 hours following cath. - per admitting team  5.  SVT/VT  - Continue with Coreg which I think would be fine to increase back to 12.5 mg twice a day.  - EF was normal on last nuclear stress test. No further workup.  - It is hard to say whether or not she actually lost consciousness, she was lightheaded with altered mental status. I do not think that this was necessarily related to potential ventricular tachycardia as she was demonstrating this behavior in the emergency department without evidence of VT.  Okay with discharge.   Candee Furbish 07/11/2015, 11:27 AM

## 2015-07-12 MED FILL — Nitroglycerin IV Soln 200 MCG/ML in D5W: INTRAVENOUS | Qty: 250 | Status: AC

## 2015-07-12 MED FILL — Heparin Sodium (Porcine) 2 Unit/ML in Sodium Chloride 0.9%: INTRAMUSCULAR | Qty: 1000 | Status: AC

## 2015-07-18 ENCOUNTER — Telehealth: Payer: Self-pay | Admitting: Internal Medicine

## 2015-07-18 NOTE — Telephone Encounter (Signed)
Pt need TOC Discharge date 07/11/15 HFU 07/25/15

## 2015-07-19 ENCOUNTER — Encounter: Payer: Self-pay | Admitting: Physician Assistant

## 2015-07-20 ENCOUNTER — Other Ambulatory Visit: Payer: Self-pay | Admitting: Physician Assistant

## 2015-07-20 ENCOUNTER — Other Ambulatory Visit: Payer: Self-pay | Admitting: Internal Medicine

## 2015-07-20 NOTE — Telephone Encounter (Signed)
Last appt was 01/26/15, Next appt will be 6/20.

## 2015-07-21 ENCOUNTER — Encounter: Payer: Self-pay | Admitting: Physician Assistant

## 2015-07-25 ENCOUNTER — Ambulatory Visit: Payer: Self-pay | Admitting: Internal Medicine

## 2015-07-26 NOTE — Telephone Encounter (Signed)
Lm for rtc 

## 2015-07-27 NOTE — Telephone Encounter (Signed)
Pt calling helen back °

## 2015-07-27 NOTE — Telephone Encounter (Signed)
Transition Care Management Follow-up Telephone Call   Date discharged?6/13   How have you been since you were released from the hospital?" Good"   Do you understand why you were in the hospital? "i was having breathing problems, like i wanted to throw up, passed out"   Do you understand the discharge instructions? " THEY REALLY didn't tell me anything"   Where were you discharged to? home   Items Reviewed:  Medications reviewed: yes  Allergies reviewed: yes  Dietary changes reviewed: yes  Referrals reviewed: cardiology 7/3   Functional Questionnaire:   Activities of Daily Living (ADLs):   She states they are independent in the following: self  States they require assistance with the following: self   Any transportation issues/concerns?: no   Any patient concerns? Yes, "i s it all right for someone my age to have sex after this sort of thing?" pt was reassured that as long as she felt well to continue normal activities, that if she had short of breath, ch pain, N&V, severe h/a, weakness to seek medical assist   Confirmed importance and date/time of follow-up visits scheduled rescheduled with dr Heber Covington 6/28, discussed cardiology appt  Provider Appointment booked with dr Heber Cayce  Confirmed with patient if condition begins to worsen call PCP or go to the ER.  Patient was given the office number and encouraged to call back with question or concerns.  yes

## 2015-08-02 ENCOUNTER — Encounter: Payer: Self-pay | Admitting: Internal Medicine

## 2015-08-02 ENCOUNTER — Inpatient Hospital Stay: Payer: Self-pay | Admitting: Internal Medicine

## 2015-08-07 ENCOUNTER — Encounter: Payer: Self-pay | Admitting: Physician Assistant

## 2015-08-07 ENCOUNTER — Ambulatory Visit (INDEPENDENT_AMBULATORY_CARE_PROVIDER_SITE_OTHER): Payer: Medicare Other | Admitting: Internal Medicine

## 2015-08-07 ENCOUNTER — Ambulatory Visit (INDEPENDENT_AMBULATORY_CARE_PROVIDER_SITE_OTHER): Payer: Medicare Other | Admitting: Physician Assistant

## 2015-08-07 VITALS — BP 150/80 | HR 68 | Ht 64.5 in | Wt 204.8 lb

## 2015-08-07 VITALS — BP 158/85 | HR 61 | Temp 98.2°F | Ht 64.5 in | Wt 206.1 lb

## 2015-08-07 DIAGNOSIS — I10 Essential (primary) hypertension: Secondary | ICD-10-CM

## 2015-08-07 DIAGNOSIS — E876 Hypokalemia: Secondary | ICD-10-CM | POA: Diagnosis not present

## 2015-08-07 DIAGNOSIS — E785 Hyperlipidemia, unspecified: Secondary | ICD-10-CM

## 2015-08-07 DIAGNOSIS — I25119 Atherosclerotic heart disease of native coronary artery with unspecified angina pectoris: Secondary | ICD-10-CM

## 2015-08-07 DIAGNOSIS — R55 Syncope and collapse: Secondary | ICD-10-CM

## 2015-08-07 DIAGNOSIS — R079 Chest pain, unspecified: Secondary | ICD-10-CM

## 2015-08-07 DIAGNOSIS — Z7984 Long term (current) use of oral hypoglycemic drugs: Secondary | ICD-10-CM

## 2015-08-07 DIAGNOSIS — I2510 Atherosclerotic heart disease of native coronary artery without angina pectoris: Secondary | ICD-10-CM | POA: Diagnosis not present

## 2015-08-07 DIAGNOSIS — E1151 Type 2 diabetes mellitus with diabetic peripheral angiopathy without gangrene: Secondary | ICD-10-CM

## 2015-08-07 DIAGNOSIS — Z9889 Other specified postprocedural states: Secondary | ICD-10-CM

## 2015-08-07 DIAGNOSIS — R42 Dizziness and giddiness: Secondary | ICD-10-CM

## 2015-08-07 MED ORDER — CARVEDILOL 12.5 MG PO TABS: 12.5000 mg | ORAL_TABLET | Freq: Two times a day (BID) | ORAL | Status: DC

## 2015-08-07 MED ORDER — METFORMIN HCL ER 500 MG PO TB24: 1000.0000 mg | ORAL_TABLET | Freq: Every day | ORAL | Status: DC

## 2015-08-07 MED ORDER — LISINOPRIL-HYDROCHLOROTHIAZIDE 20-12.5 MG PO TABS: 2.0000 | ORAL_TABLET | Freq: Every day | ORAL | Status: DC

## 2015-08-07 NOTE — Progress Notes (Signed)
Cardiology Office Note   Date:  08/07/2015   ID:  [name], DOB March 14, 1945, MRN [id number]  PCP:  Lucious Groves, DO  Cardiologist:  Dr. Burt Knack    Chief Complaint  Patient presents with  . Hospitalization Follow-up    seen for Dr. Burt Knack      History of Present Illness: [name] is a 70 y.o. female who presents for post hospitalization follow-up. Patient has a history of CAD ((s/p DES to OM1 in 07/2014, recent cath in 02/2015 showing CTO if RCA with collaterals present and severe stenosis of LCx with DES placed), HTN, HLD, DMII, and h/o TIA who presented to [name] on 6/4 with possible syncopal event. While in the ED, she complained of worsening chest discomfort and dyspnea on exertion for the past several month. Overnight her serial troponin was negative. She did however had 30+ run of SVT versus VT. She underwent cardiac catheterization on 07/10/2015 which showed known chronically occluded proximal RCA, 25% proximal to mid LAD, widely patent left circumflex stent, severe systemic hypertension with elevated LVEDP of 40 mmHg. Echocardiogram was obtained on the same day which showed EF [id number], grade 1 diastolic dysfunction. According to discharge note, it is hard to say whether or not she actually lost consciousness, she was lightheaded with altered mental status, therefore it is not necessarily related to potential ventricular tachycardia as patient was demonstrating the same behavior in the ED without evidence of SVT. However Coreg was increased to 12.5 mg twice a day  She is been doing relatively well since she left the hospital. She denies any further chest discomfort. She does have occasional left shoulder ache which I recommended Tylenol given the fact that she is on aspirin and Plavix. Otherwise she has been compliant with all her medication. She is not sure why her blood pressure continued to be high. I will increase the carvedilol back to 12.5 mg twice a day especially given the fact  that she had 30+ run of non-sustained VT versus SVT in the hospital. Otherwise, she has not had any further dizziness spell as well. She appears to be stable on physical exam. She will follow-up with Dr. Burt Knack in 3 month.   Past Medical History  Diagnosis Date  . Cervical polyp   . Neck mass   . Cervical lymphadenopathy   . Urinary tract infection   . Hyperlipidemia     takes Crestor daily  . Coronary artery disease     Prior stent of the lCX with chronic total occlusion of a small RCA. Last cath in 2004 showing patent stent, nonobstructive disease and total RCA. EF is 65 to 70%. She has been managed medically.   . Tubular adenoma of rectum 10/24/04  . Diabetes mellitus without complication ([place])     takes Metformin daily  . Hypothyroidism     takes Synthroid daily  . Hypertension     takes Amlodipine,Coreg,and Prinzide daily  . TIA (transient ischemic attack)   . Back pain     reason unknown  . History of colon polyps   . Depression     sees a Social worker at [name] but no meds  . Umbilical hernia   . OSA (obstructive sleep apnea)     PT DENIES, STATES SHE WAS TOLD SHE DIDN'T HAVE OSA, NO CPAP  . PTSD (post-traumatic stress disorder)   . Renal artery stenosis ([name]) 2007  . Stroke ([place])     TIA- LAST ONE GREATER THAN 10 YEARS  PER PT  . CAD (coronary artery disease)     a. 07/2014: DES to OM1  b. 02/2015: CTO to RCA     Past Surgical History  Procedure Laterality Date  . Cardiac catheterization  2005/2006/2007/2010  . Colonoscopy  10/2004  . Appendectomy    . Ankle surgery Left     plates and screws  . Coronary angioplasty      x 1  . Umbilical hernia repair    . Umbilical hernia repair N/A 05/27/2013    Procedure: HERNIA REPAIR UMBILICAL ADULT;  Surgeon: [name]. Teresa Dover, MD;  Location: [facility];  Service: General;  Laterality: N/A;  . Insertion of mesh N/A 05/27/2013    Procedure: INSERTION OF MESH;  Surgeon: [name]. Teresa Dover, MD;  Location: [facility];  Service:  General;  Laterality: N/A;  . Cardiac catheterization N/A 07/18/2014    Procedure: Left Heart Cath and Coronary Angiography;  Surgeon: Teresa Harp, MD; LAD patent, RCA 100% with L-R collaterals, OM1 80% stenosed proximal to previously placed stent, 30% ISR, EF 55%  . Renal artery stent Bilateral 2007    Dr. Albertine Patricia  . Coronary stent placement  1998    NIR 3.0 x 25 to the OM1  . Coronary stent placement  07/18/2014    SYNERGY DES 2.5X12 to the OM1   . Percutaneous coronary stent intervention (pci-s)  02/13/2015    des  to proximal circumflex  . Cardiac catheterization N/A 02/13/2015    Procedure: Left Heart Cath and Coronary Angiography;  Surgeon: Teresa Mocha, MD;  Location: [facility];  Service: Cardiovascular;  Laterality: N/A;  . Cardiac catheterization Right 02/13/2015    Procedure: Coronary Stent Intervention;  Surgeon: Teresa Mocha, MD;  Location: [facility];  Service: Cardiovascular;  Laterality: Right;  . Cardiac catheterization N/A 07/10/2015    Procedure: Left Heart Cath and Coronary Angiography;  Surgeon: Teresa Man, MD;  Location: [facility];  Service: Cardiovascular;  Laterality: N/A;     Current Outpatient Prescriptions  Medication Sig Dispense Refill  . amLODipine (NORVASC) 10 MG tablet Take 1 tablet (10 mg total) by mouth daily. 90 tablet 0  . aspirin 81 MG tablet Take 1 tablet (81 mg total) by mouth daily.    Marland Kitchen atorvastatin (LIPITOR) 80 MG tablet Take 1 tablet (80 mg total) by mouth daily. 30 tablet 11  . carvedilol (COREG) 12.5 MG tablet Take 1 tablet (12.5 mg total) by mouth 2 (two) times daily with a meal. 60 tablet 5  . clopidogrel (PLAVIX) 75 MG tablet TAKE ONE TABLET BY MOUTH ONCE DAILY WITH  BREAKFAST 30 tablet 7  . isosorbide mononitrate (IMDUR) 30 MG 24 hr tablet TAKE ONE TABLET BY MOUTH ONCE DAILY 30 tablet 6  . levothyroxine (SYNTHROID, LEVOTHROID) 125 MCG tablet Take 1 tablet (125 mcg total) by mouth daily before breakfast. 90  tablet 3  . lisinopril-hydrochlorothiazide (PRINZIDE,ZESTORETIC) 20-12.5 MG tablet TAKE ONE TABLET BY MOUTH TWICE DAILY 60 tablet 0  . metFORMIN (GLUCOPHAGE XR) 500 MG 24 hr tablet Take 1 tablet (500 mg total) by mouth daily with breakfast. 30 tablet 11  . nitroGLYCERIN (NITROSTAT) 0.4 MG SL tablet Place 1 tablet (0.4 mg total) under the tongue every 5 (five) minutes as needed for chest pain. Up to 3 doses 25 tablet 2   No current facility-administered medications for this visit.    Allergies:   Review of patient's allergies indicates no known allergies.    Social History:  The patient  reports that she has quit smoking. She has never used smokeless tobacco. She reports that she drinks alcohol. She reports that she does not use illicit drugs.   Family History:  The patient's family history includes Alzheimer's disease in her father; Heart attack in her maternal grandfather; Hypertension in her mother; Osteoarthritis in her mother. There is no history of Cancer, Stroke, Colon cancer, Esophageal cancer, Stomach cancer, or Rectal cancer.    ROS:  Please see the history of present illness.   Otherwise, review of systems are positive for None.   All other systems are reviewed and negative.    PHYSICAL EXAM: VS:  BP 150/80 mmHg  Pulse 68  Ht 5' 4.5" (1.638 m)  Wt 204 lb 12.8 oz (92.897 kg)  BMI 34.62 kg/m2 , BMI Body mass index is 34.62 kg/(m^2). GEN: Well nourished, well developed, in no acute distress HEENT: normal Neck: no JVD, carotid bruits, or masses Cardiac: RRR; no murmurs, rubs, or gallops,no edema  Respiratory:  clear to auscultation bilaterally, normal work of breathing GI: soft, nontender, nondistended, + BS MS: no deformity or atrophy Skin: warm and dry, no rash Neuro:  Strength and sensation are intact Psych: euthymic mood, full affect   EKG:  EKG is ordered today. The ekg ordered today demonstrates NSR with TWI in II, aVF, V5-V6   Recent Labs: 08/25/2014: ALT  13 07/09/2015: Hemoglobin 10.2*; Platelets 258 07/10/2015: Magnesium 2.0; TSH 3.468 07/11/2015: BUN 12; Creatinine, Ser 0.84; Potassium 3.8; Sodium 138    Lipid Panel    Component Value Date/Time   CHOL 107 01/26/2015 1410   CHOL 202* 08/25/2014 0741   TRIG 134 01/26/2015 1410   HDL 32* 01/26/2015 1410   HDL 33.20* 08/25/2014 0741   CHOLHDL 3.3 01/26/2015 1410   CHOLHDL 6 08/25/2014 0741   VLDL 26.8 08/25/2014 0741   LDLCALC 48 01/26/2015 1410   LDLCALC 142* 08/25/2014 0741   LDLDIRECT 196.8 07/11/2010 1107      Wt Readings from Last 3 Encounters:  08/07/15 204 lb 12.8 oz (92.897 kg)  07/11/15 209 lb (94.802 kg)  03/01/15 211 lb 12.8 oz (96.072 kg)      Other studies Reviewed: Additional studies/ records that were reviewed today include:   Cath 07/10/2015 Conclusion     Prox RCA lesion, 100% stenosed. Known chronic occlusion - not imaged  Prox LAD to Mid LAD lesion, 25% stenosed. stable  Widely patent Circumflex-OM1 stent  Severe systemic hypertension with elevated LVEDP of 40 mmHg.   I don't see any angiographic evidence of any new disease compared to last catheterization. The stent is widely patent. She did have an LVEDP - however she had not been receiving her ACE inhibitor or calcium channel blocker while in the hospital.    Echo 07/11/2015 LV EF: 60% - 65%  ------------------------------------------------------------------- Indications: Chest pain 786.51.  ------------------------------------------------------------------- History: PMH: Syncope. Risk factors: Hypertension. Diabetes mellitus. Dyslipidemia.  ------------------------------------------------------------------- Study Conclusions  - Left ventricle: The cavity size was normal. Wall thickness was  increased in a pattern of moderate LVH. Systolic function was  normal. The estimated ejection fraction was in the range of 60%  to 65%. Wall motion was normal; there were no regional  wall  motion abnormalities. Doppler parameters are consistent with  abnormal left ventricular relaxation (grade 1 diastolic  dysfunction).   Review of the above records demonstrates:   Recent hospitalization for presyncope, had a 30+ rest of VT versus SVT. Cardiac catheterization showed no significant change. Echocardiogram  shows normal EF.   ASSESSMENT AND PLAN:  1.  Dizziness/presyncope: resolved, BP is high today, given episode of 30+ run of VT vs SVT in the hospital, will increase coreg to 12.5mg  BID. Advised keep hydration  2. CAD: stable on recent cath  3. HTN, uncontrolled: increase coreg to 12.5mg  BID. Noted to have moderate LVH on recent echo  4. HLD: continue lipitor  5. DMII: on metformin   Current medicines are reviewed at length with the patient today.  The patient does not have concerns regarding medicines.  The following changes have been made:  Increase coreg to 25mg  BID  Labs/ tests ordered today include:   Orders Placed This Encounter  Procedures  . EKG 12-Lead     Disposition:   FU with Dr. Burt Knack in 3 months  Signed, Almyra Deforest, [place]  08/07/2015 12:16 PM    [address], [place], [place]  [address] Phone: [phone]; Fax: [phone]

## 2015-08-07 NOTE — Assessment & Plan Note (Signed)
A:  Stable on recent cardiac catheterization.  P: - continue current meds - continues to follow with cardiology

## 2015-08-07 NOTE — Assessment & Plan Note (Signed)
A: Patient adherent to atorvastatin 80mg  daily  P: - continue current regimen

## 2015-08-07 NOTE — Progress Notes (Signed)
[name], female   DOB: 02-16-1945, 70 y.o.   MRN: [id number]   CC: hospital follow up for chest pain as well as HTN and DM management HPI: [name] is a 70 y.o. woman with PMHx as detailed below presenting today for follow up for recent chest pain hospitalization and mangement of her HTN and diabetes.    Past Medical History  Diagnosis Date  . Cervical polyp   . Neck mass   . Cervical lymphadenopathy   . Urinary tract infection   . Hyperlipidemia     takes Crestor daily  . Coronary artery disease     Prior stent of the lCX with chronic total occlusion of a small RCA. Last cath in 2004 showing patent stent, nonobstructive disease and total RCA. EF is 65 to 70%. She has been managed medically.   . Tubular adenoma of rectum 10/24/04  . Diabetes mellitus without complication ([place])     takes Metformin daily  . Hypothyroidism     takes Synthroid daily  . Hypertension     takes Amlodipine,Coreg,and Prinzide daily  . TIA (transient ischemic attack)   . Back pain     reason unknown  . History of colon polyps   . Depression     sees a Social worker at [facility] but no meds  . Umbilical hernia   . OSA (obstructive sleep apnea)     PT DENIES, STATES SHE WAS TOLD SHE DIDN'T HAVE OSA, NO CPAP  . PTSD (post-traumatic stress disorder)   . Renal artery stenosis ([place]) 2007  . Stroke ([place])     TIA- LAST ONE GREATER THAN 10 YEARS PER PT  . CAD (coronary artery disease)     a. 07/2014: DES to OM1  b. 02/2015: CTO to RCA    Review of Systems: Review of Systems  Constitutional: Negative for fever and chills.  Eyes: Negative for blurred vision.  Respiratory: Negative for shortness of breath.   Cardiovascular: Negative for chest pain.  Neurological: Negative for headaches.    Physical Exam: Filed Vitals:   08/07/15 1347 08/07/15 1443 08/07/15 1444  BP: 170/82 171/81 158/85  Pulse: 58 61 61  Temp: 98.2 F (36.8 C)    TempSrc: Oral    Height: 5' 4.5" (1.638 m)      Weight: 206 lb 1.6 oz (93.486 kg)    SpO2: 100%     Physical Exam  Constitutional: She is oriented to person, place, and time and well-developed, well-nourished, and in no distress.  HENT:  Head: Normocephalic and atraumatic.  Eyes: EOM are normal.  Cardiovascular: Normal rate and regular rhythm.   Pulmonary/Chest: Effort normal and breath sounds normal.  Neurological: She is alert and oriented to person, place, and time.     Assessment & Plan:  See encounters tab for problem based medical decision making. Patient discussed with Dr. Daryll Drown  Coronary atherosclerosis A:  Stable on recent cardiac catheterization.  P: - continue current meds - continues to follow with cardiology  DM (diabetes mellitus), type 2 with peripheral vascular complications ([place]) Lab Results  Component Value Date   HGBA1C 7.6* 07/09/2015   A:  Previously controlled on metformin XR 500mg  daily.  Most recent A1c from recent hospitalization was 7.6.  She reports eating more sweets than she knows she should.  Has not had any issues with taking metformin in the past.  P: - increase metformin XR to 1000mg  daily  Hyperlipidemia A: Patient adherent to atorvastatin 80mg  daily  P: - continue current regimen  Essential hypertension BP Readings from Last 3 Encounters:  08/07/15 158/85  08/07/15 150/80  07/11/15 142/70   A: hypertensive today to 170/82.  On recheck, was 171/81 right arm and 158/85 left arm.  Was 150/80 earlier today at cardiology office.  Had her Coreg titrated back up to 12.5mg  BID.  She otherwise continues to take lisinopril-HCTZ 20-12.5mg  daily (rx written for BID but she reports just taking daily), coreg 12.5 BID, Imdur 30mg  daily, and amlodipine 10mg  daily.  She reports taking her morning meds this morning.  P: - feel that controlling her BP is important given her cardiac risk so will increase her lisinopril to 40-25mg  daily by instructing her to take 2 tablets daily - home BP  log - decrease salt intake and encourage exercise.  She reports recently joining the [facility] - RTC 1 week for repeat BP check and BMET -continue other medications

## 2015-08-07 NOTE — Assessment & Plan Note (Signed)
BP Readings from Last 3 Encounters:  08/07/15 158/85  08/07/15 150/80  07/11/15 142/70   A: hypertensive today to 170/82.  On recheck, was 171/81 right arm and 158/85 left arm.  Was 150/80 earlier today at cardiology office.  Had her Coreg titrated back up to 12.5mg  BID.  She otherwise continues to take lisinopril-HCTZ 20-12.5mg  daily (rx written for BID but she reports just taking daily), coreg 12.5 BID, Imdur 30mg  daily, and amlodipine 10mg  daily.  She reports taking her morning meds this morning.  P: - feel that controlling her BP is important given her cardiac risk so will increase her lisinopril to 40-25mg  daily by instructing her to take 2 tablets daily - home BP log - decrease salt intake and encourage exercise.  She reports recently joining the [facility] - RTC 1 week for repeat BP check and BMET -continue other medications

## 2015-08-07 NOTE — Assessment & Plan Note (Signed)
Lab Results  Component Value Date   HGBA1C 7.6* 07/09/2015   A:  Previously controlled on metformin XR 500mg  daily.  Most recent A1c from recent hospitalization was 7.6.  She reports eating more sweets than she knows she should.  Has not had any issues with taking metformin in the past.  P: - increase metformin XR to 1000mg  daily

## 2015-08-07 NOTE — Patient Instructions (Signed)
Thank you for coming to see me today. It was a pleasure. Today we talked about:   Blood pressure: continue taking Imdur, Amlodipine, and Coreg as you have been prescribed.  For your Lisinopril-HCTZ combination pill, take 2 tablets once per day.  Diabetes: Take 2 tablets of metformin daily for a total dose of 1000mg  daily.  Please follow-up with [place] in 1 week.  If you have any questions or concerns, please do not hesitate to call the office at [phone].  Take Care,   Jule Ser, DO

## 2015-08-07 NOTE — Patient Instructions (Addendum)
Medication Instructions:  Your physician has recommended you make the following change in your medication:  INCREASE Carvedilol to 12.5mg  twice daily. An Rx has been sent to your pharmcy   Labwork: None ordered  Testing/Procedures: None ordered  Follow-Up: Your physician recommends that you schedule a follow-up appointment in: 3 months with Dr.Cooper   Any Other Special Instructions Will Be Listed Below (If Applicable).     If you need a refill on your cardiac medications before your next appointment, please call your pharmacy.

## 2015-08-14 NOTE — Progress Notes (Signed)
Internal Medicine Clinic Attending  Case discussed with Dr. Wallace at the time of the visit.  We reviewed the resident's history and exam and pertinent patient test results.  I agree with the assessment, diagnosis, and plan of care documented in the resident's note.  

## 2015-08-18 ENCOUNTER — Ambulatory Visit (INDEPENDENT_AMBULATORY_CARE_PROVIDER_SITE_OTHER): Payer: Medicare Other | Admitting: Internal Medicine

## 2015-08-18 ENCOUNTER — Encounter: Payer: Self-pay | Admitting: Internal Medicine

## 2015-08-18 VITALS — BP 142/62 | HR 74 | Temp 98.4°F | Wt 206.5 lb

## 2015-08-18 DIAGNOSIS — I10 Essential (primary) hypertension: Secondary | ICD-10-CM

## 2015-08-18 NOTE — Patient Instructions (Signed)
Thank you for your visit today You are doing a good job with taking your blood pressure medicines.  Please continue to take your current medicines . Please follow up in 1-3 months General Instructions:    Treatment Goals:  Goals (1 Years of Data) as of 08/18/15          As of Today 08/07/15 08/07/15 08/07/15 08/07/15     Blood Pressure   . Blood Pressure < 140/90  142/62 158/85 171/81 170/82 150/80     Result Component   . HEMOGLOBIN A1C < 7.0         . LDL CALC < 100            Progress Toward Treatment Goals:  Treatment Goal 07/26/2014  Hemoglobin A1C at goal  Blood pressure at goal    Self Care Goals & Plans:  Self Care Goal 08/07/2015  Manage my medications take my medicines as prescribed; bring my medications to every visit; refill my medications on time  Monitor my health keep track of my blood glucose; bring my glucose meter and log to each visit  Eat healthy foods eat baked foods instead of fried foods; drink diet soda or water instead of juice or soda; eat more vegetables; eat foods that are low in salt  Be physically active take a walk every day  Other -    Home Blood Glucose Monitoring 07/26/2014  Check my blood sugar no home glucose monitoring     Care Management & Community Referrals:  Referral 07/26/2014  Referrals made for care management support none needed

## 2015-08-18 NOTE — Progress Notes (Signed)
[name], female   DOB: 1945-12-07, 70 y.o.   MRN: [id number]    CC: hypertension follow up HPI: [name] is a 70 y.o. woman with PMH noted below who is here for follow up of her hypertension  Please see Problem List/A&P for the status of the patient's chronic medical problems   Past Medical History  Diagnosis Date  . Cervical polyp   . Neck mass   . Cervical lymphadenopathy   . Urinary tract infection   . Hyperlipidemia     takes Crestor daily  . Coronary artery disease     Prior stent of the lCX with chronic total occlusion of a small RCA. Last cath in 2004 showing patent stent, nonobstructive disease and total RCA. EF is 65 to 70%. She has been managed medically.   . Tubular adenoma of rectum 10/24/04  . Diabetes mellitus without complication ([place])     takes Metformin daily  . Hypothyroidism     takes Synthroid daily  . Hypertension     takes Amlodipine,Coreg,and Prinzide daily  . TIA (transient ischemic attack)   . Back pain     reason unknown  . History of colon polyps   . Depression     sees a Social worker at [facility] but no meds  . Umbilical hernia   . OSA (obstructive sleep apnea)     PT DENIES, STATES SHE WAS TOLD SHE DIDN'T HAVE OSA, NO CPAP  . PTSD (post-traumatic stress disorder)   . Renal artery stenosis ([place]) 2007  . Stroke ([place])     TIA- LAST ONE GREATER THAN 10 YEARS PER PT  . CAD (coronary artery disease)     a. 07/2014: DES to OM1  b. 02/2015: CTO to RCA     Review of Systems:  Negative except as per HPI/problem list  Physical Exam: Filed Vitals:   08/18/15 1504  BP: 142/62  Pulse: 74  Temp: 98.4 F (36.9 C)  TempSrc: Oral  Weight: 206 lb 8 oz (93.668 kg)  SpO2: 99%    General: A&O, in NAD CV: RRR, normal s1, s2, no m/r/g Resp: equal and symmetric breath sounds, no wheezing or crackles  Abdomen: soft, nontender, nondistended, +BS    Assessment & Plan:   See encounters tab for problem based medical decision  making. Patient discussed with Dr. Dareen Piano

## 2015-08-18 NOTE — Assessment & Plan Note (Addendum)
BP Readings from Last 3 Encounters:  08/18/15 142/62  08/07/15 158/85  08/07/15 150/80    Lab Results  Component Value Date   NA 138 07/11/2015   K 3.8 07/11/2015   CREATININE 0.84 07/11/2015   Her blood pressure is at goal today and was 142/62. She is compliant with all of her medicines including the recent increase in lisinopril-hctz. She denies any headaches, fevers, dizziness.  Plan Obtained BMET Continue current medicines RTC in 2-3 months for follow up with PCP

## 2015-08-19 LAB — BMP8+ANION GAP
Anion Gap: 19 mmol/L — ABNORMAL HIGH (ref 10.0–18.0)
BUN/Creatinine Ratio: 14 (ref 12–28)
BUN: 13 mg/dL (ref 8–27)
CO2: 24 mmol/L (ref 18–29)
Calcium: 9.2 mg/dL (ref 8.7–10.3)
Chloride: 99 mmol/L (ref 96–106)
Creatinine, Ser: 0.9 mg/dL (ref 0.57–1.00)
GFR calc Af Amer: 77 mL/min/{1.73_m2} (ref >59)
GFR calc non Af Amer: 66 mL/min/{1.73_m2} (ref >59)
Glucose: 130 mg/dL — ABNORMAL HIGH (ref 65–99)
Potassium: 3.2 mmol/L — ABNORMAL LOW (ref 3.5–5.2)
Sodium: 142 mmol/L (ref 134–144)

## 2015-08-22 ENCOUNTER — Telehealth: Payer: Self-pay | Admitting: Internal Medicine

## 2015-08-22 NOTE — Progress Notes (Signed)
Internal Medicine Clinic Attending  Case discussed with Dr. Saraiya at the time of the visit.  We reviewed the resident's history and exam and pertinent patient test results.  I agree with the assessment, diagnosis, and plan of care documented in the resident's note.  

## 2015-08-22 NOTE — Telephone Encounter (Signed)
I called [name] and she is doing well. Informed that her K was low at 3.2. Given that she increased her lisinopril, one would expect hyperkalemia but she was hypokalemic. Asked her to come back to the lab this week to recheck her BMET. Pt will come on Thursday.  For persistent hypokalemia, may need to change the hctz to K sparing.

## 2015-08-22 NOTE — Addendum Note (Signed)
Addended by: Burgess Estelle A on: 08/22/2015 10:08 AM   Modules accepted: Orders

## 2015-09-26 ENCOUNTER — Other Ambulatory Visit: Payer: Self-pay | Admitting: *Deleted

## 2015-09-26 MED ORDER — ATORVASTATIN CALCIUM 80 MG PO TABS: 80.0000 mg | ORAL_TABLET | Freq: Every day | ORAL | 5 refills | Status: DC

## 2015-10-17 NOTE — Addendum Note (Signed)
Addended by: Burgess Estelle A on: 10/17/2015 03:31 PM   Modules accepted: Orders

## 2015-10-27 ENCOUNTER — Ambulatory Visit (INDEPENDENT_AMBULATORY_CARE_PROVIDER_SITE_OTHER): Payer: Medicare Other | Admitting: Internal Medicine

## 2015-10-27 ENCOUNTER — Encounter: Payer: Self-pay | Admitting: Internal Medicine

## 2015-10-27 VITALS — BP 149/83 | HR 68 | Temp 97.7°F | Ht 64.5 in | Wt 207.5 lb

## 2015-10-27 DIAGNOSIS — Z87891 Personal history of nicotine dependence: Secondary | ICD-10-CM | POA: Diagnosis not present

## 2015-10-27 DIAGNOSIS — I10 Essential (primary) hypertension: Secondary | ICD-10-CM | POA: Diagnosis not present

## 2015-10-27 DIAGNOSIS — M79622 Pain in left upper arm: Secondary | ICD-10-CM | POA: Diagnosis not present

## 2015-10-27 MED ORDER — NAPROXEN 500 MG PO TABS: 500.0000 mg | ORAL_TABLET | Freq: Two times a day (BID) | ORAL | 0 refills | Status: DC

## 2015-10-27 MED ORDER — CYCLOBENZAPRINE HCL 5 MG PO TABS: 5.0000 mg | ORAL_TABLET | Freq: Every day | ORAL | 0 refills | Status: DC

## 2015-10-27 NOTE — Patient Instructions (Addendum)
Thank you for visiting [place] today. Your pain is most likely muscular in origin. I'm giving you a prescription for naproxen 500 mg for your shoulder pain, you can take 8 twice daily if needed. Please try some heating pads. I'm also giving you a prescription for muscle relaxant, take it at night if needed. Your blood pressure was little high today, we are not making any changes at this time. We will reassess that during your visit with PCP. You can come back and see [place] if your symptoms get worse.

## 2015-10-27 NOTE — Progress Notes (Signed)
Internal Medicine Clinic Attending  I saw and evaluated the patient.  I personally confirmed the key portions of the history and exam documented by Dr. Amin and I reviewed pertinent patient test results.  The assessment, diagnosis, and plan were formulated together and I agree with the documentation in the resident's note. 

## 2015-10-27 NOTE — Progress Notes (Signed)
   CC: Left upper arm pain after flu and pneumococcal vaccine.  HPI:  [name] is a 70 y.o. with past medical history as listed below, came to the clinic with complaint of left shoulder pain after getting her flu and pneumococcal vaccine approximately 4 weeks ago at [employer] [employer]. Vaccination was done on August 20, and both injections were placed on the same arm. She states that she got really swollen up around after the injection, she uses some ice and her swelling did went down , but never completely resolved .she is having persistent pain in that region, occasionally radiating to her left shoulder and left side of neck .she states that pain gets aggravated with sleeping on left side and with left arm movement .she can still dress herself and do her daily routine but experience increased pain with any left shoulder movement . She states that her pain is 3-4/10 at rest and increases to 7-8/10 with movement. She denies any injury, lifting heavy weight. She has no history of any job involving a more shoulder movements. Denies any erythema, fever associated with this pain .  Past Medical History:  Diagnosis Date  . Back pain    reason unknown  . CAD (coronary artery disease)    a. 07/2014: DES to OM1  b. 02/2015: CTO to RCA   . Cervical lymphadenopathy   . Cervical polyp   . Coronary artery disease    Prior stent of the lCX with chronic total occlusion of a small RCA. Last cath in 2004 showing patent stent, nonobstructive disease and total RCA. EF is 65 to 70%. She has been managed medically.   . Depression    sees a counselor at [facility] but no meds  . Diabetes mellitus without complication ([place])    takes Metformin daily  . History of colon polyps   . Hyperlipidemia    takes Crestor daily  . Hypertension    takes Amlodipine,Coreg,and Prinzide daily  . Hypothyroidism    takes Synthroid daily  . Neck mass   . OSA (obstructive sleep apnea)    PT DENIES, STATES SHE WAS TOLD SHE  DIDN'T HAVE OSA, NO CPAP  . PTSD (post-traumatic stress disorder)   . Renal artery stenosis ([place]) 2007  . Stroke ([place])    TIA- LAST ONE GREATER THAN 10 YEARS PER PT  . TIA (transient ischemic attack)   . Tubular adenoma of rectum 10/24/04  . Umbilical hernia   . Urinary tract infection     Review of Systems:  AS per HPI.  Physical Exam:  Vitals:   10/27/15 1043  BP: (!) 149/83  Pulse: 68  Temp: 97.7 F (36.5 C)  TempSrc: Oral  SpO2: 100%  Weight: 207 lb 8 oz (94.1 kg)  Height: 5' 4.5" (1.638 m)   Gen. Well-built, well-nourished lady, in no acute distress. Musculoskeletal. She had mild edema around her left deltoid region, no erythema, no change in temperature as compared to right. She had normal range of motion. She had some tenderness at deltoid region and along the trapezius. Strength and sensations were normal. Drop arm test was negative. Lungs. Clear bilaterally. CV. RRR, No r/m/g. Extremities. No edema, no cyanosis, pulses 2+ bilaterally.    Assessment & Plan:   See Encounters Tab for problem based charting.  Patient seen with Dr. Angelia Mould.

## 2015-10-27 NOTE — Assessment & Plan Note (Signed)
She came to the clinic with complaint of left shoulder pain after getting her flu and pneumococcal vaccine approximately 4 weeks ago at [employer]. Vaccination was done on August 20, and both injections were placed on the same arm. She states that she got really swollen up around after the injection, she uses some ice and her swelling did went down , but never completely resolved .she is having persistent pain in that region, occasionally radiating to her left shoulder and left side of neck .she states that pain gets aggravated with sleeping on left side and with left arm movement .she can still dress herself and do her daily routine but experience increased pain with any left shoulder movement. On exam. She had mild edema around her left deltoid region, no erythema, no change in temperature as compared to right. She had normal range of motion. She had some tenderness at deltoid region and along the trapezius. Strength and sensations were normal. Drop arm test was negative.  Pain looks like more muscular in the region.  Naproxen 500 mg twice a day for pain if needed. Flexeril 5 mg at bedtime, she was instructed to use for only a few days and at bedtime. She was instructed to use some heating pad. Follow-up if her symptoms get worse.

## 2015-10-27 NOTE — Assessment & Plan Note (Signed)
BP Readings from Last 3 Encounters:  10/27/15 (!) 149/83  08/18/15 (!) 142/62  08/07/15 (!) 158/85   Her blood pressure was high today. She states that she is being compliant with her new regimen. She took her morning pills before coming to the clinic.  We'll continue with the current management and reassess during her follow-up visit.

## 2015-11-28 ENCOUNTER — Ambulatory Visit (INDEPENDENT_AMBULATORY_CARE_PROVIDER_SITE_OTHER): Payer: Medicare Other | Admitting: Internal Medicine

## 2015-11-28 VITALS — BP 178/77 | HR 57 | Temp 98.1°F | Ht 64.5 in | Wt 208.8 lb

## 2015-11-28 DIAGNOSIS — S90811A Abrasion, right foot, initial encounter: Secondary | ICD-10-CM | POA: Diagnosis not present

## 2015-11-28 DIAGNOSIS — E1151 Type 2 diabetes mellitus with diabetic peripheral angiopathy without gangrene: Secondary | ICD-10-CM | POA: Diagnosis not present

## 2015-11-28 DIAGNOSIS — Z7984 Long term (current) use of oral hypoglycemic drugs: Secondary | ICD-10-CM

## 2015-11-28 DIAGNOSIS — B9689 Other specified bacterial agents as the cause of diseases classified elsewhere: Secondary | ICD-10-CM | POA: Diagnosis not present

## 2015-11-28 DIAGNOSIS — N390 Urinary tract infection, site not specified: Secondary | ICD-10-CM

## 2015-11-28 DIAGNOSIS — X58XXXA Exposure to other specified factors, initial encounter: Secondary | ICD-10-CM

## 2015-11-28 LAB — POCT GLYCOSYLATED HEMOGLOBIN (HGB A1C): Hemoglobin A1C: 6.4

## 2015-11-28 LAB — POCT URINALYSIS DIPSTICK
Bilirubin, UA: NEGATIVE
Glucose, UA: NEGATIVE
Ketones, UA: NEGATIVE
Nitrite, UA: POSITIVE
Protein, UA: NEGATIVE
Spec Grav, UA: 1.01
Urobilinogen, UA: 0.2
pH, UA: 7

## 2015-11-28 LAB — GLUCOSE, CAPILLARY: Glucose-Capillary: 117 mg/dL — ABNORMAL HIGH (ref 65–99)

## 2015-11-28 MED ORDER — NITROFURANTOIN MONOHYD MACRO 100 MG PO CAPS: 100.0000 mg | ORAL_CAPSULE | Freq: Two times a day (BID) | ORAL | 0 refills | Status: DC

## 2015-11-28 NOTE — Progress Notes (Signed)
CC: Right heel abrasion  HPI:  [name] is a 70 y.o. female with PMH as listed below who presents for evaluation of a right heel abrasion and for urinary symptoms.  Right heel abrasion: Patient reports friction on her right heel against the back of her tennis shoes caused an abrasion which she noticed a few weeks ago. There was some bleeding at first, however the area is now scabbed over without further bleeding or discharge. No pain or erythema around the area. She is wearing open heel shoes now.  UTI: Patient has noticed some spotting of blood in her urine for the last week. She reports occasional leakage of urine and increased urinary frequency. She has experienced low back pain during this time, but no flank pain. She denies any burning on urination, fevers, chills, or abdominal pain.  T2DM: Patient currently takes Metformin XR 1000 mg daily. Last Hgb A1c was 7.6. She denies any symptoms of high or low blood sugars.   Past Medical History:  Diagnosis Date  . Back pain    reason unknown  . CAD (coronary artery disease)    a. 07/2014: DES to OM1  b. 02/2015: CTO to RCA   . Cervical lymphadenopathy   . Cervical polyp   . Coronary artery disease    Prior stent of the lCX with chronic total occlusion of a small RCA. Last cath in 2004 showing patent stent, nonobstructive disease and total RCA. EF is 65 to 70%. She has been managed medically.   . Depression    sees a counselor at [facility] but no meds  . Diabetes mellitus without complication ([place])    takes Metformin daily  . History of colon polyps   . Hyperlipidemia    takes Crestor daily  . Hypertension    takes Amlodipine,Coreg,and Prinzide daily  . Hypothyroidism    takes Synthroid daily  . Neck mass   . OSA (obstructive sleep apnea)    PT DENIES, STATES SHE WAS TOLD SHE DIDN'T HAVE OSA, NO CPAP  . PTSD (post-traumatic stress disorder)   . Renal artery stenosis ([place]) 2007  . Stroke ([place])    TIA- LAST ONE  GREATER THAN 10 YEARS PER PT  . TIA (transient ischemic attack)   . Tubular adenoma of rectum 10/24/04  . Umbilical hernia   . Urinary tract infection     Review of Systems:   Review of Systems  Constitutional: Negative for chills and fever.  Respiratory: Negative for shortness of breath.   Cardiovascular: Negative for chest pain.  Gastrointestinal: Negative for abdominal pain, nausea and vomiting.  Genitourinary: Positive for frequency and hematuria. Negative for dysuria.  Musculoskeletal: Positive for back pain.  Skin:       Scab right heel     Physical Exam:  Vitals:   11/28/15 0913  BP: (!) 178/77  Pulse: (!) 57  Temp: 98.1 F (36.7 C)  TempSrc: Oral  SpO2: 100%  Weight: 208 lb 12.8 oz (94.7 kg)  Height: 5' 4.5" (1.638 m)   Physical Exam  Constitutional: She is oriented to person, place, and time. She appears well-developed and well-nourished. No distress.  Cardiovascular: Normal rate and regular rhythm.   Murmur heard. Pulmonary/Chest: Effort normal. No respiratory distress. She has no wheezes. She has no rales.  Abdominal: Soft. There is no guarding.  Mild suprapubic tenderness  Neurological: She is alert and oriented to person, place, and time.  Skin:  Healing scab on right heel without erythema, tenderness, discharge,  or bleeding.    Assessment & Plan:   See Encounters Tab for problem based charting.  Patient discussed with Dr. Angelia Mould

## 2015-11-28 NOTE — Patient Instructions (Addendum)
It was a pleasure to meet you Ms. Deyo.  Your right heel looks like it is healing well without a sign of infection. Please let this continue to heal on its own.  I will send an antibiotic for your UTI called Nitrofurantoin (Macrobid) 100 mg. Take this twice a day for three days. If you have continued symptoms or blood after 1-2 weeks, please let us know.  We will check your Hgb A1c today.  Please follow up with Dr. Heber [place].

## 2015-11-29 LAB — URINALYSIS, ROUTINE W REFLEX MICROSCOPIC
Bilirubin, UA: NEGATIVE
Glucose, UA: NEGATIVE
Ketones, UA: NEGATIVE
Nitrite, UA: NEGATIVE
Protein, UA: NEGATIVE
Specific Gravity, UA: 1.008 (ref 1.005–1.030)
Urobilinogen, Ur: 1 mg/dL (ref 0.2–1.0)
pH, UA: 7 (ref 5.0–7.5)

## 2015-11-29 LAB — MICROSCOPIC EXAMINATION
Casts: NONE SEEN /lpf
WBC, UA: >30 /hpf — AB (ref 0–?)

## 2015-11-29 NOTE — Assessment & Plan Note (Signed)
Patient currently takes Metformin XR 1000 mg daily. Last Hgb A1c was 7.6. She denies any symptoms of high or low blood sugars.  Hgb A1c is now 6.4, well controlled. -Continue current meds

## 2015-11-29 NOTE — Assessment & Plan Note (Signed)
Patient reports friction on her right heel against the back of her tennis shoes caused an abrasion which she noticed a few weeks ago. There was some bleeding at first, however the area is now scabbed over without further bleeding or discharge. No pain or erythema around the area. She is wearing open heel shoes now.  Right heel has a scabbed over lesion without sign of infection. Advised patient to allow area to heal on its own without picking at the area and avoid shoes which may cause friction over her heels.

## 2015-11-29 NOTE — Assessment & Plan Note (Signed)
Patient has noticed some spotting of blood in her urine for the last week. She reports occasional leakage of urine and increased urinary frequency. She has experienced low back pain during this time, but no flank pain. She denies any burning on urination, fevers, chills, or abdominal pain.  Urine dipstick is consistent with UTI, does also show blood which may be related to UTI. Microscopy shows 3-10 RBCs. Will treat with 3 days Nitrofurantoin as uncomplicated UTI given well-controlled diabetes. Will need repeat UA to assess for hematuria.

## 2015-12-01 NOTE — Progress Notes (Signed)
Internal Medicine Clinic Attending  Case discussed with Dr. Patel,Vishal at the time of the visit.  We reviewed the resident's history and exam and pertinent patient test results.  I agree with the assessment, diagnosis, and plan of care documented in the resident's note.  

## 2015-12-07 ENCOUNTER — Encounter: Payer: Self-pay | Admitting: Internal Medicine

## 2016-01-02 ENCOUNTER — Other Ambulatory Visit: Payer: Self-pay | Admitting: Internal Medicine

## 2016-01-02 DIAGNOSIS — I10 Essential (primary) hypertension: Secondary | ICD-10-CM

## 2016-01-22 DIAGNOSIS — Z124 Encounter for screening for malignant neoplasm of cervix: Secondary | ICD-10-CM | POA: Diagnosis not present

## 2016-01-22 DIAGNOSIS — Z01419 Encounter for gynecological examination (general) (routine) without abnormal findings: Secondary | ICD-10-CM | POA: Diagnosis not present

## 2016-01-31 ENCOUNTER — Other Ambulatory Visit: Payer: Self-pay | Admitting: Internal Medicine

## 2016-01-31 DIAGNOSIS — N950 Postmenopausal bleeding: Secondary | ICD-10-CM | POA: Diagnosis not present

## 2016-01-31 DIAGNOSIS — Z1231 Encounter for screening mammogram for malignant neoplasm of breast: Secondary | ICD-10-CM

## 2016-02-06 ENCOUNTER — Other Ambulatory Visit: Payer: Self-pay | Admitting: Internal Medicine

## 2016-02-06 ENCOUNTER — Other Ambulatory Visit: Payer: Self-pay | Admitting: Cardiology

## 2016-02-06 DIAGNOSIS — E038 Other specified hypothyroidism: Secondary | ICD-10-CM

## 2016-02-29 ENCOUNTER — Ambulatory Visit: Payer: Self-pay

## 2016-03-11 ENCOUNTER — Other Ambulatory Visit: Payer: Self-pay | Admitting: Cardiovascular Disease

## 2016-03-14 ENCOUNTER — Ambulatory Visit
Admission: RE | Admit: 2016-03-14 | Discharge: 2016-03-14 | Disposition: A | Discharge status: Home / Self Care | Payer: Medicare Other | Source: Ambulatory Visit | Attending: Internal Medicine | Admitting: Internal Medicine

## 2016-03-14 DIAGNOSIS — Z1231 Encounter for screening mammogram for malignant neoplasm of breast: Secondary | ICD-10-CM | POA: Diagnosis not present

## 2016-03-15 ENCOUNTER — Other Ambulatory Visit: Payer: Self-pay | Admitting: *Deleted

## 2016-03-15 DIAGNOSIS — I10 Essential (primary) hypertension: Secondary | ICD-10-CM

## 2016-03-17 MED ORDER — LISINOPRIL-HYDROCHLOROTHIAZIDE 20-12.5 MG PO TABS: 2.0000 | ORAL_TABLET | Freq: Every day | ORAL | 0 refills | Status: DC

## 2016-03-18 ENCOUNTER — Other Ambulatory Visit: Payer: Self-pay | Admitting: Physician Assistant

## 2016-03-18 NOTE — Telephone Encounter (Signed)
Refill request

## 2016-03-26 ENCOUNTER — Encounter: Payer: Self-pay | Admitting: *Deleted

## 2016-03-29 ENCOUNTER — Other Ambulatory Visit: Payer: Self-pay | Admitting: Physician Assistant

## 2016-04-04 ENCOUNTER — Ambulatory Visit: Payer: Self-pay | Admitting: Internal Medicine

## 2016-04-10 DIAGNOSIS — H2513 Age-related nuclear cataract, bilateral: Secondary | ICD-10-CM | POA: Diagnosis not present

## 2016-04-10 DIAGNOSIS — E119 Type 2 diabetes mellitus without complications: Secondary | ICD-10-CM | POA: Diagnosis not present

## 2016-04-10 DIAGNOSIS — H1851 Endothelial corneal dystrophy: Secondary | ICD-10-CM | POA: Diagnosis not present

## 2016-04-10 DIAGNOSIS — H04123 Dry eye syndrome of bilateral lacrimal glands: Secondary | ICD-10-CM | POA: Diagnosis not present

## 2016-04-25 ENCOUNTER — Ambulatory Visit: Payer: Self-pay | Admitting: Internal Medicine

## 2016-05-01 DIAGNOSIS — R3129 Other microscopic hematuria: Secondary | ICD-10-CM | POA: Insufficient documentation

## 2016-05-01 NOTE — Progress Notes (Signed)
[facility] Subjective:  HPI: TeresaTeresa [name] is a 71 y.o. female who presents for follow up DM, HTN.  Please see problem based charting below for the status of her chronic medical problems.     Review of Systems: Per HPI Objective:  Physical Exam: Vitals:   05/02/16 1058 05/02/16 1204  BP: (!) 169/85 (!) 165/77  Pulse: 67 64  SpO2: 100%   Weight: 208 lb 11.2 oz (94.7 kg)   Height: 5\' 4"  (1.626 m)    Physical Exam  Constitutional: She is well-developed, well-nourished, and in no distress.  HENT:  Head: Normocephalic and atraumatic.  Swollen inferior turbinates, post nasal drip  Cardiovascular: Normal rate, regular rhythm and intact distal pulses.   Pulmonary/Chest: Effort normal and breath sounds normal.  Abdominal: Soft.  Musculoskeletal: She exhibits no edema.  Skin:  Thickened discolored nails bilateral great toes  Psychiatric: Mood and affect normal.  Nursing note and vitals reviewed.   Assessment & Plan:  Essential hypertension HPI: Brought her medications with her, she has lisinopril HCTZ combination, IMDUR, coreg.  She is taking both.  She has not been taking amlodipine.  A: Essential HTN, not at goal  P: -Continue Lisinopril HCTZ 20-12.5mg  2 tablets daily -Continue IMDUR 30mg  daily -Continue Coreg 12.5mg  BID -Add Amlodipine 5mg  daily - Monitor BP at home if >140/90 follow up sooner than 6 months  Coronary atherosclerosis HPI: No current chest pain with exertion.  She recently heard about a recall with plavix so she stopped taking it, will go by pharmacy today to see if her medication is affected.  She does not have atorvastatin with her. She does not think she is taking atorvastatin currently.  A: Continue Plavix +ASA (will check with pharmacy about recall today) Resume Atrovastatin Continue IMDUR -Continue Coreg 12.5mg  BID  Hypothyroidism HPI: reports low energy for the last 1 month, also endorces anhedonia and decreased  appetite, does not feel depressed.  She does not have her synthroid with her, she does not think she has been taking it.  A: Hypothyroidism  P: Recheck TSH Resume Synthroid 165mcg daily Also check CBC and CMP, as well as Hep C given her age a fatigue.  DM (diabetes mellitus), type 2 with peripheral vascular complications (HCC) HPI: Taking Metformin XR 1000mg  daily.  No polydispia or polyuria, low energy as above but no hypoglycemia.  A: Controlled type 2 DM with Peripherial vascular disease (A1c 6.5% today)  P: Continue metformin Obtain records of last eye visit with Dr Katy Fitch   Onychomycosis of right great toe HPI: Would like to see podiatry for toenail  A/P Onychomycosis  Referral to podiatry  Microscopic hematuria She had microscopic hematuria at her last visit, this was likely due to UTI but she will need a repeat U/A.  Due to extended visit with many topics we did not get to this.  Will need to obtain U/A at next opporturnity.  Allergic rhinitis HPI: Patient reports 1 week of cough and nasal congestion.  No fever or chills.  Happens occasionally in the spring.  Worse at night.  Has not tried anything.  A: Allergic rhinnits  P: Start Claritin 10mg  daily OTC   Medications Ordered Meds ordered this encounter  Medications  . atorvastatin (LIPITOR) 80 MG tablet    Sig: Take 1 tablet (80 mg total) by mouth daily.    Dispense:  90 tablet    Refill:  3  . amLODipine (NORVASC) 5 MG tablet    Sig: Take  1 tablet (5 mg total) by mouth daily.    Dispense:  90 tablet    Refill:  3  . loratadine (CLARITIN) 10 MG tablet    Sig: Take 1 tablet (10 mg total) by mouth daily.   Other Orders Orders Placed This Encounter  Procedures  . DG Bone Density    Standing Status:   Future    Standing Expiration Date:   07/02/2017    Order Specific Question:   Reason for Exam (SYMPTOM  OR DIAGNOSIS REQUIRED)    Answer:   osteoporosis screening    Order Specific Question:   Preferred  imaging location?    Answer:   [facility]  . Glucose, capillary  . TSH  . CMP14 + Anion Gap  . CBC no Diff  . Hepatitis C antibody  . Ambulatory referral to Podiatry    Referral Priority:   Routine    Referral Type:   Consultation    Referral Reason:   Specialty Services Required    Requested Specialty:   Podiatry    Number of Visits Requested:   1  . POCT HgB A1C (CPT [phone])   Follow Up: Return in about 6 months (around 11/02/2016), or if symptoms worsen or fail to improve.

## 2016-05-02 ENCOUNTER — Ambulatory Visit (INDEPENDENT_AMBULATORY_CARE_PROVIDER_SITE_OTHER): Payer: Medicare Other | Admitting: Internal Medicine

## 2016-05-02 ENCOUNTER — Encounter (INDEPENDENT_AMBULATORY_CARE_PROVIDER_SITE_OTHER): Payer: Self-pay

## 2016-05-02 ENCOUNTER — Encounter: Payer: Self-pay | Admitting: Internal Medicine

## 2016-05-02 VITALS — BP 165/77 | HR 64 | Ht 64.0 in | Wt 208.7 lb

## 2016-05-02 DIAGNOSIS — E1151 Type 2 diabetes mellitus with diabetic peripheral angiopathy without gangrene: Secondary | ICD-10-CM

## 2016-05-02 DIAGNOSIS — I2510 Atherosclerotic heart disease of native coronary artery without angina pectoris: Secondary | ICD-10-CM | POA: Diagnosis not present

## 2016-05-02 DIAGNOSIS — E039 Hypothyroidism, unspecified: Secondary | ICD-10-CM

## 2016-05-02 DIAGNOSIS — Z13820 Encounter for screening for osteoporosis: Secondary | ICD-10-CM

## 2016-05-02 DIAGNOSIS — Z87891 Personal history of nicotine dependence: Secondary | ICD-10-CM | POA: Diagnosis not present

## 2016-05-02 DIAGNOSIS — B351 Tinea unguium: Secondary | ICD-10-CM

## 2016-05-02 DIAGNOSIS — Z87448 Personal history of other diseases of urinary system: Secondary | ICD-10-CM | POA: Diagnosis not present

## 2016-05-02 DIAGNOSIS — R3129 Other microscopic hematuria: Secondary | ICD-10-CM

## 2016-05-02 DIAGNOSIS — E038 Other specified hypothyroidism: Secondary | ICD-10-CM

## 2016-05-02 DIAGNOSIS — Z7984 Long term (current) use of oral hypoglycemic drugs: Secondary | ICD-10-CM | POA: Diagnosis not present

## 2016-05-02 DIAGNOSIS — I10 Essential (primary) hypertension: Secondary | ICD-10-CM | POA: Diagnosis not present

## 2016-05-02 DIAGNOSIS — R5383 Other fatigue: Secondary | ICD-10-CM

## 2016-05-02 DIAGNOSIS — J309 Allergic rhinitis, unspecified: Secondary | ICD-10-CM | POA: Insufficient documentation

## 2016-05-02 DIAGNOSIS — J302 Other seasonal allergic rhinitis: Secondary | ICD-10-CM

## 2016-05-02 LAB — GLUCOSE, CAPILLARY: Glucose-Capillary: 131 mg/dL — ABNORMAL HIGH (ref 65–99)

## 2016-05-02 LAB — POCT GLYCOSYLATED HEMOGLOBIN (HGB A1C): Hemoglobin A1C: 6.5

## 2016-05-02 MED ORDER — LORATADINE 10 MG PO TABS: 10.0000 mg | ORAL_TABLET | Freq: Every day | ORAL | Status: DC

## 2016-05-02 MED ORDER — ATORVASTATIN CALCIUM 80 MG PO TABS: 80.0000 mg | ORAL_TABLET | Freq: Every day | ORAL | 3 refills | Status: DC

## 2016-05-02 MED ORDER — AMLODIPINE BESYLATE 5 MG PO TABS: 5.0000 mg | ORAL_TABLET | Freq: Every day | ORAL | 3 refills | Status: DC

## 2016-05-02 NOTE — Assessment & Plan Note (Signed)
She had microscopic hematuria at her last visit, this was likely due to UTI but she will need a repeat U/A.  Due to extended visit with many topics we did not get to this.  Will need to obtain U/A at next opporturnity.

## 2016-05-02 NOTE — Assessment & Plan Note (Addendum)
HPI: reports low energy for the last 1 month, also endorces anhedonia and decreased appetite, does not feel depressed.  She does not have her synthroid with her, she does not think she has been taking it.  A: Hypothyroidism  P: Recheck TSH Resume Synthroid 144mcg daily Also check CBC and CMP, as well as Hep C given her age a fatigue.  ADDENDUM: TSH returned at 11. Given her age and TSH only mildly elevated while not taking 144mcg I will reduce her dose to 39mcg.  I have called and spoke to the patient as well as sent in a new prescription.

## 2016-05-02 NOTE — Assessment & Plan Note (Signed)
HPI: Taking Metformin XR 1000mg  daily.  No polydispia or polyuria, low energy as above but no hypoglycemia.  A: Controlled type 2 DM with Peripherial vascular disease (A1c 6.5% today)  P: Continue metformin Obtain records of last eye visit with Dr Katy Fitch

## 2016-05-02 NOTE — Assessment & Plan Note (Signed)
HPI: Would like to see podiatry for toenail  A/P Onychomycosis  Referral to podiatry

## 2016-05-02 NOTE — Patient Instructions (Addendum)
I want you to start back taking Atorvastatin 80mg  a day (cholesterol) I also want you to take Amlodipine 5mg  a day (Blood pressure)  I think your low evergy may be due to not taking your synthroid medication, you should be taking 163mcg of levothyroxine (synthroid) a day.  I will check the blood test today and call with the results.  I want you to start taking claritin for your allergies.

## 2016-05-02 NOTE — Assessment & Plan Note (Signed)
HPI: No current chest pain with exertion.  She recently heard about a recall with plavix so she stopped taking it, will go by pharmacy today to see if her medication is affected.  She does not have atorvastatin with her. She does not think she is taking atorvastatin currently.  A: Continue Plavix +ASA (will check with pharmacy about recall today) Resume Atrovastatin Continue IMDUR -Continue Coreg 12.5mg  BID

## 2016-05-02 NOTE — Assessment & Plan Note (Signed)
HPI: Patient reports 1 week of cough and nasal congestion.  No fever or chills.  Happens occasionally in the spring.  Worse at night.  Has not tried anything.  A: Allergic rhinnits  P: Start Claritin 10mg  daily OTC

## 2016-05-02 NOTE — Assessment & Plan Note (Addendum)
HPI: Brought her medications with her, she has lisinopril HCTZ combination, IMDUR, coreg.  She is taking both.  She has not been taking amlodipine.  A: Essential HTN, not at goal  P: -Continue Lisinopril HCTZ 20-12.5mg  2 tablets daily -Continue IMDUR 30mg  daily -Continue Coreg 12.5mg  BID -Add Amlodipine 5mg  daily - Monitor BP at home if >140/90 follow up sooner than 6 months

## 2016-05-03 LAB — CBC
Hematocrit: 34.7 % (ref 34.0–46.6)
Hemoglobin: 11 g/dL — ABNORMAL LOW (ref 11.1–15.9)
MCH: 27.3 pg (ref 26.6–33.0)
MCHC: 31.7 g/dL (ref 31.5–35.7)
MCV: 86 fL (ref 79–97)
Platelets: 308 10*3/uL (ref 150–379)
RBC: 4.03 x10E6/uL (ref 3.77–5.28)
RDW: 14.4 % (ref 12.3–15.4)
WBC: 7 10*3/uL (ref 3.4–10.8)

## 2016-05-03 LAB — CMP14 + ANION GAP
ALT: 12 IU/L (ref 0–32)
AST: 14 IU/L (ref 0–40)
Albumin/Globulin Ratio: 1.2 (ref 1.2–2.2)
Albumin: 3.7 g/dL (ref 3.6–4.8)
Alkaline Phosphatase: 61 IU/L (ref 39–117)
Anion Gap: 18 mmol/L (ref 10.0–18.0)
BUN/Creatinine Ratio: 15 (ref 12–28)
BUN: 13 mg/dL (ref 8–27)
Bilirubin Total: 0.3 mg/dL (ref 0.0–1.2)
CO2: 26 mmol/L (ref 18–29)
Calcium: 9.3 mg/dL (ref 8.7–10.3)
Chloride: 99 mmol/L (ref 96–106)
Creatinine, Ser: 0.84 mg/dL (ref 0.57–1.00)
GFR calc Af Amer: 83 mL/min/{1.73_m2} (ref >59)
GFR calc non Af Amer: 72 mL/min/{1.73_m2} (ref >59)
Globulin, Total: 3.1 g/dL (ref 1.5–4.5)
Glucose: 107 mg/dL — ABNORMAL HIGH (ref 65–99)
Potassium: 3.6 mmol/L (ref 3.5–5.2)
Sodium: 143 mmol/L (ref 134–144)
Total Protein: 6.8 g/dL (ref 6.0–8.5)

## 2016-05-03 LAB — HEPATITIS C ANTIBODY: Hep C Virus Ab: <0.1 s/co ratio (ref 0.0–0.9)

## 2016-05-03 LAB — TSH: TSH: 11.92 u[IU]/mL — ABNORMAL HIGH (ref 0.450–4.500)

## 2016-05-03 MED ORDER — LEVOTHYROXINE SODIUM 88 MCG PO TABS: 88.0000 ug | ORAL_TABLET | Freq: Every day | ORAL | 3 refills | Status: DC

## 2016-05-03 NOTE — Addendum Note (Signed)
Addended by: Joni Reining C on: 05/03/2016 12:35 PM   Modules accepted: Orders

## 2016-05-24 ENCOUNTER — Ambulatory Visit (INDEPENDENT_AMBULATORY_CARE_PROVIDER_SITE_OTHER): Payer: Medicare Other | Admitting: Podiatry

## 2016-05-24 ENCOUNTER — Encounter: Payer: Self-pay | Admitting: Podiatry

## 2016-05-24 VITALS — BP 140/71 | HR 71 | Resp 16 | Ht 64.5 in | Wt 201.0 lb

## 2016-05-24 DIAGNOSIS — L600 Ingrowing nail: Secondary | ICD-10-CM

## 2016-05-24 NOTE — Patient Instructions (Signed)

## 2016-05-24 NOTE — Progress Notes (Signed)
   Subjective:    [name], female    DOB: [birth year], 71 y.o.   MRN: [id number]  HPI Chief Complaint  Patient presents with  . Nail Problem    Bilateral; great toes; nail discoloration & thick nails; x1 yr      Review of Systems  HENT: Positive for tinnitus.   All other systems reviewed and are negative.      Objective:   Physical Exam        Assessment & Plan:

## 2016-05-26 NOTE — Progress Notes (Signed)
Subjective:     [name], female   DOB: [birth year], 71 y.o.   MRN: [id number]  HPI patient presents with painful nail bed right over left that are very thick dystrophic and impossible for the patient to cut. Patient states she's had these for years and years   Review of Systems  All other systems reviewed and are negative.      Objective:   Physical Exam  Constitutional: She is oriented to person, place, and time.  Cardiovascular: Intact distal pulses.   Musculoskeletal: Normal range of motion.  Neurological: She is oriented to person, place, and time.  Skin: Skin is warm.  Nursing note and vitals reviewed.  neurovascular status intact muscle strength adequate range of motion within normal limits with patient found to have very thickened damaged hallux nail right over left that are very painful and impossible for her to cut and have thickened more and more over the years. I did note good digital perfusion and she had excellent pulses and her sugar has been under very good control     Assessment:     Severe nail disease hallux bilateral that are painful impossible to cut and risk for the patient    Plan:     H&P conditions reviewed treatment options discussed. I've recommended nail removal and I explained procedure risk and the fact she is a diabetic. Patient wants surgery and today I infiltrated each hallux 60 mg like Marcaine mixture remove the hallux nails bilateral exposed matrix and applied phenol 5 applications 30 seconds followed by alcohol lavage and sterile dressing. Gave instructions on soaks and reappoint

## 2016-06-25 ENCOUNTER — Telehealth: Payer: Self-pay | Admitting: *Deleted

## 2016-06-25 NOTE — Telephone Encounter (Signed)
Patient called with concerns of her nail still not being healed. I explained to her that removal a total nail did take some time to heal completely. I do not think its infected. Advised to continue to soak and to call next week if no better

## 2016-07-09 ENCOUNTER — Other Ambulatory Visit: Payer: Self-pay | Admitting: *Deleted

## 2016-07-09 DIAGNOSIS — I10 Essential (primary) hypertension: Secondary | ICD-10-CM

## 2016-07-09 MED ORDER — LISINOPRIL-HYDROCHLOROTHIAZIDE 20-12.5 MG PO TABS: 2.0000 | ORAL_TABLET | Freq: Every day | ORAL | 0 refills | Status: DC

## 2016-08-13 ENCOUNTER — Other Ambulatory Visit: Payer: Self-pay

## 2016-08-13 NOTE — Patient Outreach (Signed)
[facility]) Care Management  08/13/2016  [name] 07/10/1945 [id number]  Medication adherence call to [name]  We call Mrs. Short  she is  past due on one on her  medication under [facility] on lisinopril/hctz 20/12.5 she wants me to help her  order the medication from  [place] and she also wants a 90 days supply call [facility] they said she has  a prescription for a 90 days supply and that they will  fill the medication for her. Call patient back and told her that medication will be ready for her in a couple of hours .   [place] Management Direct Dial [phone]  Fax [phone] [email or web address]

## 2016-10-04 ENCOUNTER — Ambulatory Visit (INDEPENDENT_AMBULATORY_CARE_PROVIDER_SITE_OTHER): Payer: Medicare Other | Admitting: Internal Medicine

## 2016-10-04 VITALS — BP 140/66 | HR 58 | Temp 98.2°F | Ht 64.5 in | Wt 212.7 lb

## 2016-10-04 DIAGNOSIS — R5382 Chronic fatigue, unspecified: Secondary | ICD-10-CM

## 2016-10-04 DIAGNOSIS — I10 Essential (primary) hypertension: Secondary | ICD-10-CM

## 2016-10-04 DIAGNOSIS — Z87891 Personal history of nicotine dependence: Secondary | ICD-10-CM

## 2016-10-04 DIAGNOSIS — Z79899 Other long term (current) drug therapy: Secondary | ICD-10-CM

## 2016-10-04 DIAGNOSIS — D649 Anemia, unspecified: Secondary | ICD-10-CM

## 2016-10-04 DIAGNOSIS — G4733 Obstructive sleep apnea (adult) (pediatric): Secondary | ICD-10-CM | POA: Diagnosis not present

## 2016-10-04 DIAGNOSIS — E039 Hypothyroidism, unspecified: Secondary | ICD-10-CM

## 2016-10-04 DIAGNOSIS — Z8249 Family history of ischemic heart disease and other diseases of the circulatory system: Secondary | ICD-10-CM | POA: Diagnosis not present

## 2016-10-04 MED ORDER — SPIRONOLACTONE 25 MG PO TABS: 25.0000 mg | ORAL_TABLET | Freq: Every day | ORAL | 1 refills | Status: DC

## 2016-10-04 NOTE — Assessment & Plan Note (Addendum)
Patient continues to complain of chronic fatigue at this visit. Reports having occasional dyspnea on exertion and occasional heart palpitations. Denies having any chest pain. Reports having increased daytime somnolence despite sleeping adequately at night. Denies noticing any blood in her stool but does mention having occasional dark stools.  Her chronic fatigue is likely multifactorial in the setting of chronic anemia, hypothyroidism, beta blocker use, mild OSA, and weight gain. Hemoglobin checked on labs in March 2018 was stable at 11. TSH checked at that time was high at 11.9 and patient was started on Synthroid 88 g. She has been taking this medication.. Sleep study done in April 2015 consistent with mild OSA and she is currently not on CPAP. She has gained 11 pounds in the past 6 months.  Plan -Check CBC -Check ferritin, iron, and TIBC -Check TSH -Educated patient about healthy eating and exercise. Emphasized the importance of weight loss.  Addendum: Hemoglobin and iron studies normal. TSH normal. -Spoke to the patient over the phone and advised her to continue taking her current dose of Synthroid 88 g daily. Encouraged her to lose weight.

## 2016-10-04 NOTE — Assessment & Plan Note (Signed)
History of uncontrolled hypertension. Initial blood pressure 173/76 and repeat 140/66 at this visit. Current medication regimen includes lisinopril-hydrochlorothiazide 40-25 mg daily, Imdur 30 mg daily, Coreg 12.5 mg twice daily, and amlodipine 5 mg daily. Patient had all of these medications in her medication bag except Imdur which she believes she left at home. Reports drinking coffee all the time.  Plan -Continue lisinopril and hydrochlorothiazide, Imdur, Coreg, and amlodipine -Start spironolactone 25 mg daily. Advised the patient to stop taking this medication if she experiences any symptoms of hypotension and call the clinic immediately -Check BMP at next visit to monitor potassium -Avoid caffeinated beverages -Educated patient about healthy eating and exercise. Emphasized the importance of weight loss.  -Return to the clinic in 2 weeks

## 2016-10-04 NOTE — Patient Instructions (Signed)
[name] it was nice meeting you today.  -Start taking spironolactone 25 mg once daily  -Continue taking lisinopril-hydrochlorothiazide, Imdur, Coreg, and amlodipine daily  -I would like to encourage you to eat healthy and exercise.  -Please avoid caffeinated beverages  -Return for a follow-up in 2 weeks

## 2016-10-04 NOTE — Progress Notes (Signed)
   CC: Patient is here to discuss her chronic fatigue. Hypertension was also discussed.  HPI:  [name] is a 71 y.o. female with a past medical history of conditions listed below presenting to the clinic to discuss her chronic fatigue. Hypertension was also discussed. Please see problem based charting for the status of the patient's current and chronic medical conditions.   Past Medical History:  Diagnosis Date  . Back pain    reason unknown  . CAD (coronary artery disease)    a. 07/2014: DES to OM1  b. 02/2015: CTO to RCA   . Cervical lymphadenopathy   . Cervical polyp   . Coronary artery disease    Prior stent of the lCX with chronic total occlusion of a small RCA. Last cath in 2004 showing patent stent, nonobstructive disease and total RCA. EF is 65 to 70%. She has been managed medically.   . Depression    sees a counselor at [facility] but no meds  . Diabetes mellitus without complication ([place])    takes Metformin daily  . History of colon polyps   . Hyperlipidemia    takes Crestor daily  . Hypertension    takes Amlodipine,Coreg,and Prinzide daily  . Hypothyroidism    takes Synthroid daily  . Neck mass   . OSA (obstructive sleep apnea)    PT DENIES, STATES SHE WAS TOLD SHE DIDN'T HAVE OSA, NO CPAP  . PTSD (post-traumatic stress disorder)   . Renal artery stenosis ([place]) 2007  . Stroke ([place])    TIA- LAST ONE GREATER THAN 10 YEARS PER PT  . TIA (transient ischemic attack)   . Tubular adenoma of rectum 10/24/04  . Umbilical hernia   . Urinary tract infection    Review of Systems: Pertinent positives mentioned in HPI. Remainder of all ROS negative.   Physical Exam:  Vitals:   10/04/16 0920 10/04/16 1048  BP: (!) 173/76 140/66  Pulse: 65 (!) 58  Temp: 98.2 F (36.8 C)   TempSrc: Oral   SpO2: 100%   Weight: 212 lb 11.2 oz (96.5 kg)   Height: 5' 4.5" (1.638 m)    Physical Exam  Constitutional: She is oriented to person, place, and time. She appears  well-developed and well-nourished. No distress.  HENT:  Head: Normocephalic and atraumatic.  Eyes: Right eye exhibits no discharge. Left eye exhibits no discharge.  Cardiovascular: Normal rate, regular rhythm and intact distal pulses.   Pulmonary/Chest: Effort normal and breath sounds normal. No respiratory distress. She has no wheezes. She has no rales.  Abdominal: Soft. Bowel sounds are normal. She exhibits no distension. There is no tenderness.  Musculoskeletal: She exhibits no edema.  Neurological: She is alert and oriented to person, place, and time.  Skin: Skin is warm and dry.  Psychiatric: Her behavior is normal.    Assessment & Plan:   See Encounters Tab for problem based charting.  Patient discussed with Dr. Angelia Mould

## 2016-10-05 LAB — TSH: TSH: 2.76 u[IU]/mL (ref 0.450–4.500)

## 2016-10-05 LAB — CBC
Hematocrit: 35.5 % (ref 34.0–46.6)
Hemoglobin: 11.4 g/dL (ref 11.1–15.9)
MCH: 27.3 pg (ref 26.6–33.0)
MCHC: 32.1 g/dL (ref 31.5–35.7)
MCV: 85 fL (ref 79–97)
Platelets: 279 10*3/uL (ref 150–379)
RBC: 4.17 x10E6/uL (ref 3.77–5.28)
RDW: 14.8 % (ref 12.3–15.4)
WBC: 7.2 10*3/uL (ref 3.4–10.8)

## 2016-10-05 LAB — IRON,TIBC AND FERRITIN PANEL
Ferritin: 41 ng/mL (ref 15–150)
Iron Saturation: 25 % (ref 15–55)
Iron: 76 ug/dL (ref 27–139)
Total Iron Binding Capacity: 302 ug/dL (ref 250–450)
UIBC: 226 ug/dL (ref 118–369)

## 2016-10-07 NOTE — Progress Notes (Signed)
Internal Medicine Clinic Attending  Case discussed with Dr. Rathoreat the time of the visit. We reviewed the resident's history and exam and pertinent patient test results. I agree with the assessment, diagnosis, and plan of care documented in the resident's note.  

## 2016-10-23 ENCOUNTER — Other Ambulatory Visit: Payer: Self-pay | Admitting: Internal Medicine

## 2016-10-23 DIAGNOSIS — E1151 Type 2 diabetes mellitus with diabetic peripheral angiopathy without gangrene: Secondary | ICD-10-CM

## 2016-11-08 ENCOUNTER — Other Ambulatory Visit: Payer: Self-pay | Admitting: Physician Assistant

## 2016-11-08 ENCOUNTER — Other Ambulatory Visit: Payer: Self-pay | Admitting: Cardiovascular Disease

## 2016-11-08 ENCOUNTER — Other Ambulatory Visit: Payer: Self-pay | Admitting: Pharmacist

## 2016-11-08 DIAGNOSIS — I10 Essential (primary) hypertension: Secondary | ICD-10-CM

## 2016-11-08 MED ORDER — LISINOPRIL-HYDROCHLOROTHIAZIDE 20-12.5 MG PO TABS
2.0000 | ORAL_TABLET | Freq: Every day | ORAL | 0 refills | Status: DC
Start: 2016-11-08 — End: 2018-01-22

## 2016-11-08 NOTE — Progress Notes (Signed)
Documentation of acei/arb therapy for [facility] [employer] quality metrics in DM patients  

## 2016-11-08 NOTE — Telephone Encounter (Signed)
Please review for refill, Thanks !  

## 2016-12-19 ENCOUNTER — Other Ambulatory Visit: Payer: Self-pay | Admitting: Physician Assistant

## 2016-12-19 NOTE — Telephone Encounter (Signed)
Refill Request.  

## 2016-12-19 NOTE — Telephone Encounter (Signed)
Please review for refill, Thanks !  

## 2016-12-24 ENCOUNTER — Other Ambulatory Visit: Payer: Self-pay | Admitting: Cardiovascular Disease

## 2016-12-24 MED ORDER — ISOSORBIDE MONONITRATE ER 30 MG PO TB24: 30.0000 mg | ORAL_TABLET | Freq: Every day | ORAL | 0 refills | Status: DC

## 2017-01-15 ENCOUNTER — Other Ambulatory Visit: Payer: Self-pay

## 2017-01-15 MED ORDER — CARVEDILOL 12.5 MG PO TABS: 12.5000 mg | ORAL_TABLET | Freq: Two times a day (BID) | ORAL | 0 refills | Status: DC

## 2017-01-22 NOTE — Progress Notes (Deleted)
  Subjective:  HPI: [name] is a 71 y.o. female who presents for ***  Please see Assessment and Plan below for the status of her chronic medical problems.  Review of Systems: ROS  Objective:  Physical Exam: There were no vitals filed for this visit. Physical Exam Assessment & Plan:  No problem-specific Assessment & Plan notes found for this encounter.   Medications Ordered No orders of the defined types were placed in this encounter.  Other Orders No orders of the defined types were placed in this encounter.  Follow Up: No Follow-up on file.

## 2017-01-23 ENCOUNTER — Ambulatory Visit: Payer: Self-pay | Admitting: Internal Medicine

## 2017-01-25 ENCOUNTER — Other Ambulatory Visit: Payer: Self-pay | Admitting: Cardiology

## 2017-01-27 NOTE — Telephone Encounter (Signed)
REFILL 

## 2017-02-13 ENCOUNTER — Other Ambulatory Visit: Payer: Self-pay | Admitting: Internal Medicine

## 2017-02-13 ENCOUNTER — Ambulatory Visit (INDEPENDENT_AMBULATORY_CARE_PROVIDER_SITE_OTHER): Payer: Medicare Other | Admitting: Internal Medicine

## 2017-02-13 ENCOUNTER — Other Ambulatory Visit: Payer: Self-pay

## 2017-02-13 VITALS — BP 143/77 | HR 70 | Temp 97.7°F | Ht 64.5 in | Wt 216.5 lb

## 2017-02-13 DIAGNOSIS — E1151 Type 2 diabetes mellitus with diabetic peripheral angiopathy without gangrene: Secondary | ICD-10-CM | POA: Diagnosis not present

## 2017-02-13 DIAGNOSIS — Z23 Encounter for immunization: Secondary | ICD-10-CM | POA: Diagnosis not present

## 2017-02-13 DIAGNOSIS — E2839 Other primary ovarian failure: Secondary | ICD-10-CM

## 2017-02-13 DIAGNOSIS — R3129 Other microscopic hematuria: Secondary | ICD-10-CM

## 2017-02-13 DIAGNOSIS — R3121 Asymptomatic microscopic hematuria: Secondary | ICD-10-CM

## 2017-02-13 DIAGNOSIS — Z87891 Personal history of nicotine dependence: Secondary | ICD-10-CM | POA: Diagnosis not present

## 2017-02-13 DIAGNOSIS — Z6836 Body mass index (BMI) 36.0-36.9, adult: Secondary | ICD-10-CM | POA: Diagnosis not present

## 2017-02-13 DIAGNOSIS — Z79899 Other long term (current) drug therapy: Secondary | ICD-10-CM

## 2017-02-13 DIAGNOSIS — Z7984 Long term (current) use of oral hypoglycemic drugs: Secondary | ICD-10-CM | POA: Diagnosis not present

## 2017-02-13 DIAGNOSIS — I10 Essential (primary) hypertension: Secondary | ICD-10-CM

## 2017-02-13 LAB — POCT GLYCOSYLATED HEMOGLOBIN (HGB A1C): Hemoglobin A1C: 7.2

## 2017-02-13 LAB — GLUCOSE, CAPILLARY: Glucose-Capillary: 104 mg/dL — ABNORMAL HIGH (ref 65–99)

## 2017-02-13 NOTE — Patient Instructions (Addendum)
I want you to get a DEXA scan (bone strength).  Please schedule a visit with Dr Burt Knack.

## 2017-02-13 NOTE — Progress Notes (Signed)
  Subjective:  HPI: [name] is a 72 y.o. female who presents for follow up HTN  Please see Assessment and Plan below for the status of her chronic medical problems.  Review of Systems: Review of Systems  Constitutional: Negative for malaise/fatigue and weight loss.  Respiratory: Negative for cough.   Cardiovascular: Negative for chest pain.  Genitourinary: Negative for dysuria, flank pain, frequency, hematuria and urgency.  Neurological: Negative for dizziness.  Psychiatric/Behavioral: Negative for depression.    Objective:  Physical Exam: Vitals:   02/13/17 1054  BP: (!) 143/77  Pulse: 70  Temp: 97.7 F (36.5 C)  TempSrc: Oral  SpO2: 100%  Weight: 216 lb 8 oz (98.2 kg)  Height: 5' 4.5" (1.638 m)   Physical Exam  Constitutional: She is well-developed, well-nourished, and in no distress.  Cardiovascular: Normal rate, regular rhythm and normal heart sounds.  Pulmonary/Chest: Effort normal.  Abdominal: Soft. Bowel sounds are normal.  Musculoskeletal: She exhibits no edema.  Psychiatric: Affect and judgment normal.  Nursing note and vitals reviewed.  Assessment & Plan:  Essential hypertension HPI: no complaints, asymptomatic, no issues with medications. Ran out of coreg 4 days ago, due to visit with Dr Burt Knack for refill.  A: Essential HTN, improved near goal of ,140/90  P:Continue Lisinopril-HCTZ 20-12.5 2 pills daily Spirolactone 25mg  daily Imdur 30 mg daily Coreg 12.5mg  BID Amlodipine 5mg  daily  Encouraged patient to call today to schedule visit with Dr Burt Knack, if she has trouble getting refill I could provide but she is agreeable and understands she is due for cardiology visit.  DM (diabetes mellitus), type 2 with peripheral vascular complications (HCC) HPI: dietary indiscretion over the holidays, wt gain since last visit.  A: Type 2 DM, with peripheral vascular disease, slightly above goal  P; Discussed some of the newer agents and cardiovascular  benefits, A1c is a 7.2 patient perfers to work on diet and weight loss, I think this is fine for now, continue metformin ER 1g daily  Morbid obesity ([place]) Discussed importance of weight loss on DM and HTN.  Microscopic hematuria Had microscopic hematuria and pyuria in October 2017, was treated with macrobid, never followed with a repeat U/A.  She is asymptomatic today, I repeated a U/A again with RBC and WBC Nitrite positive, certainly appears like UTI, will obtain culture, if positive will need to treat and repeat U/A IN 6 weeks if no growth from culture will need urology referral.   Medications Ordered No orders of the defined types were placed in this encounter.  Other Orders Orders Placed This Encounter  Procedures  . Microscopic Examination  . Culture, Urine  . Flu Vaccine QUAD 36+ mos IM  . Glucose, capillary  . Urinalysis, Reflex Microscopic  . POC Hbg A1C   Follow Up: Return in about 3 months (around 05/14/2017).

## 2017-02-14 LAB — URINALYSIS, ROUTINE W REFLEX MICROSCOPIC
Bilirubin, UA: NEGATIVE
Glucose, UA: NEGATIVE
Ketones, UA: NEGATIVE
Nitrite, UA: POSITIVE — AB
Protein, UA: NEGATIVE
Specific Gravity, UA: 1.011 (ref 1.005–1.030)
Urobilinogen, Ur: 0.2 mg/dL (ref 0.2–1.0)
pH, UA: 7.5 (ref 5.0–7.5)

## 2017-02-14 LAB — MICROSCOPIC EXAMINATION: Casts: NONE SEEN /lpf

## 2017-02-14 NOTE — Assessment & Plan Note (Signed)
Discussed importance of weight loss on DM and HTN.

## 2017-02-14 NOTE — Assessment & Plan Note (Signed)
Had microscopic hematuria and pyuria in October 2017, was treated with macrobid, never followed with a repeat U/A.  She is asymptomatic today, I repeated a U/A again with RBC and WBC Nitrite positive, certainly appears like UTI, will obtain culture, if positive will need to treat and repeat U/A IN 6 weeks if no growth from culture will need urology referral.

## 2017-02-14 NOTE — Assessment & Plan Note (Signed)
HPI: no complaints, asymptomatic, no issues with medications. Ran out of coreg 4 days ago, due to visit with Dr Burt Knack for refill.  A: Essential HTN, improved near goal of ,140/90  P:Continue Lisinopril-HCTZ 20-12.5 2 pills daily Spirolactone 25mg  daily Imdur 30 mg daily Coreg 12.5mg  BID Amlodipine 5mg  daily  Encouraged patient to call today to schedule visit with Dr Burt Knack, if she has trouble getting refill I could provide but she is agreeable and understands she is due for cardiology visit.

## 2017-02-14 NOTE — Assessment & Plan Note (Signed)
HPI: dietary indiscretion over the holidays, wt gain since last visit.  A: Type 2 DM, with peripheral vascular disease, slightly above goal  P; Discussed some of the newer agents and cardiovascular benefits, A1c is a 7.2 patient perfers to work on diet and weight loss, I think this is fine for now, continue metformin ER 1g daily

## 2017-02-19 MED ORDER — CEPHALEXIN 500 MG PO CAPS: 500.0000 mg | ORAL_CAPSULE | Freq: Two times a day (BID) | ORAL | 0 refills | Status: AC

## 2017-02-19 NOTE — Addendum Note (Signed)
Addended by: Lucious Groves on: 02/19/2017 04:48 PM   Modules accepted: Orders

## 2017-02-20 LAB — URINE CULTURE

## 2017-02-20 LAB — SPECIMEN STATUS REPORT

## 2017-02-27 ENCOUNTER — Ambulatory Visit
Admission: RE | Admit: 2017-02-27 | Discharge: 2017-02-27 | Disposition: A | Discharge status: Home / Self Care | Payer: Medicare Other | Source: Ambulatory Visit | Attending: Internal Medicine | Admitting: Internal Medicine

## 2017-02-27 DIAGNOSIS — E2839 Other primary ovarian failure: Secondary | ICD-10-CM

## 2017-02-27 DIAGNOSIS — Z780 Asymptomatic menopausal state: Secondary | ICD-10-CM | POA: Diagnosis not present

## 2017-02-27 DIAGNOSIS — Z13820 Encounter for screening for osteoporosis: Secondary | ICD-10-CM | POA: Diagnosis not present

## 2017-03-20 ENCOUNTER — Encounter: Payer: Self-pay | Admitting: Internal Medicine

## 2017-03-20 ENCOUNTER — Other Ambulatory Visit: Payer: Self-pay

## 2017-03-20 ENCOUNTER — Ambulatory Visit (INDEPENDENT_AMBULATORY_CARE_PROVIDER_SITE_OTHER): Payer: Medicare Other | Admitting: Internal Medicine

## 2017-03-20 VITALS — BP 170/77 | HR 58 | Temp 98.0°F | Ht 64.5 in | Wt 216.9 lb

## 2017-03-20 DIAGNOSIS — Z87891 Personal history of nicotine dependence: Secondary | ICD-10-CM

## 2017-03-20 DIAGNOSIS — Z7982 Long term (current) use of aspirin: Secondary | ICD-10-CM | POA: Diagnosis not present

## 2017-03-20 DIAGNOSIS — R3129 Other microscopic hematuria: Secondary | ICD-10-CM

## 2017-03-20 DIAGNOSIS — Z79890 Hormone replacement therapy (postmenopausal): Secondary | ICD-10-CM | POA: Diagnosis not present

## 2017-03-20 DIAGNOSIS — Z7902 Long term (current) use of antithrombotics/antiplatelets: Secondary | ICD-10-CM | POA: Diagnosis not present

## 2017-03-20 DIAGNOSIS — Z7984 Long term (current) use of oral hypoglycemic drugs: Secondary | ICD-10-CM

## 2017-03-20 DIAGNOSIS — I2510 Atherosclerotic heart disease of native coronary artery without angina pectoris: Secondary | ICD-10-CM

## 2017-03-20 DIAGNOSIS — Z6836 Body mass index (BMI) 36.0-36.9, adult: Secondary | ICD-10-CM

## 2017-03-20 DIAGNOSIS — E1151 Type 2 diabetes mellitus with diabetic peripheral angiopathy without gangrene: Secondary | ICD-10-CM

## 2017-03-20 DIAGNOSIS — R3121 Asymptomatic microscopic hematuria: Secondary | ICD-10-CM | POA: Diagnosis not present

## 2017-03-20 DIAGNOSIS — J309 Allergic rhinitis, unspecified: Secondary | ICD-10-CM

## 2017-03-20 DIAGNOSIS — R319 Hematuria, unspecified: Secondary | ICD-10-CM

## 2017-03-20 DIAGNOSIS — E038 Other specified hypothyroidism: Secondary | ICD-10-CM

## 2017-03-20 DIAGNOSIS — Z79899 Other long term (current) drug therapy: Secondary | ICD-10-CM

## 2017-03-20 DIAGNOSIS — E039 Hypothyroidism, unspecified: Secondary | ICD-10-CM | POA: Diagnosis not present

## 2017-03-20 DIAGNOSIS — I10 Essential (primary) hypertension: Secondary | ICD-10-CM

## 2017-03-20 LAB — GLUCOSE, CAPILLARY: Glucose-Capillary: 129 mg/dL — ABNORMAL HIGH (ref 65–99)

## 2017-03-20 MED ORDER — LORATADINE 10 MG PO TABS: 10.0000 mg | ORAL_TABLET | Freq: Every day | ORAL | 5 refills | Status: DC

## 2017-03-20 MED ORDER — SPIRONOLACTONE 25 MG PO TABS: 25.0000 mg | ORAL_TABLET | Freq: Every day | ORAL | 5 refills | Status: DC

## 2017-03-20 MED ORDER — CARVEDILOL 12.5 MG PO TABS: 12.5000 mg | ORAL_TABLET | Freq: Two times a day (BID) | ORAL | 5 refills | Status: DC

## 2017-03-20 NOTE — Assessment & Plan Note (Signed)
This problem is chronic and stable. She remains on her beta blocker, indoor, Plavix, aspirin, and high intensity statin. She denies symptoms today. She is overdue to see Dr. Burt Knack of cardiology and our staff is calling his office to assist her in making an appointment.  PLAN : to see Dr Burt Knack

## 2017-03-20 NOTE — Patient Instructions (Signed)
1. I will notify you of your urine tests 2. I sent in your refills 3. Try the allergy pill for your cough, runny nose, and itchy. If no better, call use because your lisinopril might be making you cough 4. Se  Dr Heber [place] in 6 months for routine follow up

## 2017-03-20 NOTE — Assessment & Plan Note (Signed)
This problem is chronic and uncontrolled. She is on Coreg 12.5 twice a day, lisinopril hydrochlorothiazide 40-25, amlodipine 5 mg once a day, spironolactone 25 once a day. She has been out of her spironolactone for a couple of weeks and her Coreg for one week. This is why her blood pressure is elevated today. She is a had no side effects to these medications. Her last BMP was in March 2018 and was normal.  PLAN : I refilled her spironolactone and her Coreg I encouraged her to get an appointment with Dr. Burt Knack ASAP as she is overdue If she does not see Dr. Burt Knack, we will need to have her come in sooner than her 6 month follow-up to recheck blood pressure and BMP

## 2017-03-20 NOTE — Assessment & Plan Note (Signed)
This problem is chronic and uncontrolled. Her weight continues to slowly increase. [facility] feel that this [employer] program. However, she works throughout the week so she needs to see if they are open on Saturday mornings. Her sister goes and she would like to accompanied her sister to have a friend to work out with.  PLAN : exercise I offered referral to nutrition today but prefers to focus on exercise   Wt Readings from Last 3 Encounters:  03/20/17 216 lb 14.4 oz (98.4 kg)  02/13/17 216 lb 8 oz (98.2 kg)  10/04/16 212 lb 11.2 oz (96.5 kg)

## 2017-03-20 NOTE — Assessment & Plan Note (Signed)
This problem is chronic and unresolved. Dr. Heber [place] has been following microscopic hematuria since March 2018. This is thought to be due to a UTI. She has a urine sample today and we will send that off for a UA. She denies gross hematuria or vaginal bleeding. She quit smoking about 30 years ago.  PLAN : UA If remains positive would need CT +/-referral to urology

## 2017-03-20 NOTE — Assessment & Plan Note (Signed)
This problem is chronic and stable. She is on metformin X are thousand once a day. She is having no side effects to this medication. She does not check her sugar at home. She denies any hypoglycemic symptoms. Her last A1c was 7.2 1 month ago.  PLAN:  Cont current meds

## 2017-03-20 NOTE — Assessment & Plan Note (Signed)
This problem is chronic and stable. She is on Synthroid 88 g daily and is having no side effects to this medication. Her last TSH was in March 2018 and was normal.  PLAN : F/U PCP

## 2017-03-20 NOTE — Progress Notes (Signed)
   Subjective:    [name], female    DOB: [birth year], 72 y.o.   MRN: [id number]  HPI  [name] is here for DM F/U. Please see the A&P for the status of the pt's chronic medical problems.  ROS : per ROS section and in problem oriented charting. All other systems are negative.  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  Review of Systems  HENT: Positive for congestion, rhinorrhea and sneezing.        Dry throat  Eyes: Positive for itching.  Respiratory: Positive for cough and shortness of breath.        No hemoptysis  Cardiovascular: Negative for chest pain and leg swelling.  Genitourinary: Negative for dysuria, hematuria and vaginal bleeding.       Objective:   Physical Exam  Constitutional: She appears well-developed and well-nourished. No distress.  HENT:  Head: Normocephalic and atraumatic.  Right Ear: External ear normal.  Left Ear: External ear normal.  Nose: Nose normal.  Eyes: Conjunctivae and EOM are normal. Right eye exhibits no discharge. Left eye exhibits no discharge. No scleral icterus.  Cardiovascular: Normal rate, regular rhythm and normal heart sounds.  No murmur heard. Pulmonary/Chest: Effort normal and breath sounds normal. No respiratory distress. She has no wheezes.  Musculoskeletal: Normal range of motion. She exhibits no edema or tenderness.  Skin: Skin is warm and dry. She is not diaphoretic.  Psychiatric: She has a normal mood and affect. Her behavior is normal. Judgment and thought content normal.      Assessment & Plan:

## 2017-03-20 NOTE — Assessment & Plan Note (Signed)
This problem is chronic and uncontrolled. About 2 years ago, she had a spell of coughing that went away without intervention. In March 2018, she had a one-week history of cough and congestion and was prescribed Claritin to take over-the-counter. When I mentioned this medication, she did not recognize it and does not think she ever took it. She is now complaining of many months of cough, sneezing, rhinorrhea, itchy eyes, nasal congestion, dyspnea, dyspnea on exertion, and a dry throat, she denies hemoptysis. She is not willing to try a nasal corticosteroid even though they can be more effective in moderate or severe allergic rhinitis. She is willing to try the Claritin and I prescribed that 10 mg once a day. I repeatedly stressed that if her cough does not get better that it might be due to her ACE inhibitor and to call us and let us know. I also included this on her AVS.  PLAN : Claritin 10 mg QD Call if cough no better and consider Ace induced cough

## 2017-03-21 LAB — URINALYSIS, ROUTINE W REFLEX MICROSCOPIC
Bilirubin, UA: NEGATIVE
Glucose, UA: NEGATIVE
Ketones, UA: NEGATIVE
Nitrite, UA: NEGATIVE
Protein, UA: NEGATIVE
Specific Gravity, UA: 1.011 (ref 1.005–1.030)
Urobilinogen, Ur: 0.2 mg/dL (ref 0.2–1.0)
pH, UA: 7.5 (ref 5.0–7.5)

## 2017-03-21 LAB — MICROSCOPIC EXAMINATION
Casts: NONE SEEN /lpf
WBC, UA: >30 /hpf — AB (ref 0–?)

## 2017-04-17 ENCOUNTER — Other Ambulatory Visit: Payer: Self-pay | Admitting: Internal Medicine

## 2017-04-17 DIAGNOSIS — Z1231 Encounter for screening mammogram for malignant neoplasm of breast: Secondary | ICD-10-CM

## 2017-04-28 ENCOUNTER — Ambulatory Visit (INDEPENDENT_AMBULATORY_CARE_PROVIDER_SITE_OTHER): Payer: Medicare Other | Admitting: Cardiovascular Disease

## 2017-04-28 ENCOUNTER — Encounter: Payer: Self-pay | Admitting: Cardiovascular Disease

## 2017-04-28 VITALS — BP 136/86 | HR 60 | Ht 64.5 in | Wt 213.0 lb

## 2017-04-28 DIAGNOSIS — I25119 Atherosclerotic heart disease of native coronary artery with unspecified angina pectoris: Secondary | ICD-10-CM | POA: Diagnosis not present

## 2017-04-28 DIAGNOSIS — I10 Essential (primary) hypertension: Secondary | ICD-10-CM

## 2017-04-28 NOTE — Patient Instructions (Signed)
Medication Instructions:  Your provider recommends that you continue on your current medications as directed. Please refer to the Current Medication list given to you today.    Labwork: None  Testing/Procedures: None  Follow-Up: Your provider wants you to follow-up in: 6 months with Scott Weaver, PA. You will receive a reminder letter in the mail two months in advance. If you don't receive a letter, please call our office to schedule the follow-up appointment.    Any Other Special Instructions Will Be Listed Below (If Applicable).     If you need a refill on your cardiac medications before your next appointment, please call your pharmacy.   

## 2017-04-28 NOTE — Progress Notes (Signed)
Cardiology Office Note Date:  04/30/2017   ID:  [name], DOB Dec 19, 1945, MRN [id number]  PCP:  Lucious Groves, DO  Cardiologist:  Sherren Mocha, MD    Chief Complaint  Patient presents with  . Palpitations     History of Present Illness: [name] is a 72 y.o. female who presents for follow-up of coronary artery disease.  The patient has a history of CAD with chronic total occlusion of the right coronary artery collateralized from the left system, mild LAD stenosis, and history of stenting of the left circumflex.  Her last heart catheterization in 2017 demonstrated continued patency of the stented segment in the circumflex.  LV function has been normal with an ejection fraction of 65%.  The patient is here alone today. She reports an episode of tachy-palpitations several weeks ago. She took a NTG and sat dome with resolution of symptoms after a few minutes. She denies chest pain, but admits to shortness of breath with exertion such as walking up hill. She has had a feeling of nausea/vomiting when walking up hill.  She has not been doing a whole lot of physical activity and feels like some of her symptoms are related to being deconditioned.  She denies lightheadedness, syncope, orthopnea, or PND.  Past Medical History:  Diagnosis Date  . Back pain    reason unknown  . CAD (coronary artery disease)    a. 07/2014: DES to OM1  b. 02/2015: CTO to RCA   . Cervical lymphadenopathy   . Cervical polyp   . Coronary artery disease    Prior stent of the lCX with chronic total occlusion of a small RCA. Last cath in 2004 showing patent stent, nonobstructive disease and total RCA. EF is 65 to 70%. She has been managed medically.   . Depression    sees a counselor at [facility] but no meds  . Diabetes mellitus without complication ([place])    takes Metformin daily  . History of colon polyps   . Hyperlipidemia    takes Crestor daily  . Hypertension    takes Amlodipine,Coreg,and  Prinzide daily  . Hypothyroidism    takes Synthroid daily  . Neck mass   . OSA (obstructive sleep apnea)    PT DENIES, STATES SHE WAS TOLD SHE DIDN'T HAVE OSA, NO CPAP  . PTSD (post-traumatic stress disorder)   . Renal artery stenosis ([place]) 2007  . Stroke ([place])    TIA- LAST ONE GREATER THAN 10 YEARS PER PT  . TIA (transient ischemic attack)   . Tubular adenoma of rectum 10/24/04  . Umbilical hernia   . Urinary tract infection     Past Surgical History:  Procedure Laterality Date  . ANKLE SURGERY Left    plates and screws  . APPENDECTOMY    . CARDIAC CATHETERIZATION  2005/2006/2007/2010  . CARDIAC CATHETERIZATION N/A 07/18/2014   Procedure: Left Heart Cath and Coronary Angiography;  Surgeon: Lorretta Harp, MD; LAD patent, RCA 100% with L-R collaterals, OM1 80% stenosed proximal to previously placed stent, 30% ISR, EF 55%  . CARDIAC CATHETERIZATION N/A 02/13/2015   Procedure: Left Heart Cath and Coronary Angiography;  Surgeon: Sherren Mocha, MD;  Location: [place] [facility];  Service: Cardiovascular;  Laterality: N/A;  . CARDIAC CATHETERIZATION Right 02/13/2015   Procedure: Coronary Stent Intervention;  Surgeon: Sherren Mocha, MD;  Location: [place] [facility];  Service: Cardiovascular;  Laterality: Right;  . CARDIAC CATHETERIZATION N/A 07/10/2015   Procedure: Left Heart Cath and  Coronary Angiography;  Surgeon: David W Harding, MD;  Location: [facility];  Service: Cardiovascular;  Laterality: N/A;  . COLONOSCOPY  10/2004  . CORONARY ANGIOPLASTY     x 1  . CORONARY STENT PLACEMENT  1998   NIR 3.0 x 25 to the OM1  . CORONARY STENT PLACEMENT  07/18/2014   SYNERGY DES 2.5X12 to the OM1   . INSERTION OF MESH N/A 05/27/2013   Procedure: INSERTION OF MESH;  Surgeon: Matthew K. Tsuei, MD;  Location: [facility] OR;  Service: General;  Laterality: N/A;  . PERCUTANEOUS CORONARY STENT INTERVENTION (PCI-S)  02/13/2015   des  to proximal circumflex  . RENAL ARTERY STENT Bilateral 2007   Dr.  Downey  . UMBILICAL HERNIA REPAIR    . UMBILICAL HERNIA REPAIR N/A 05/27/2013   Procedure: HERNIA REPAIR UMBILICAL ADULT;  Surgeon: Matthew K. Tsuei, MD;  Location: [facility] OR;  Service: General;  Laterality: N/A;    Current Outpatient Medications  Medication Sig Dispense Refill  . amLODipine (NORVASC) 5 MG tablet Take 1 tablet (5 mg total) by mouth daily. 90 tablet 3  . aspirin 81 MG tablet Take 1 tablet (81 mg total) by mouth daily.    . atorvastatin (LIPITOR) 80 MG tablet Take 1 tablet (80 mg total) by mouth daily. 90 tablet 3  . carvedilol (COREG) 12.5 MG tablet Take 1 tablet (12.5 mg total) by mouth 2 (two) times daily with a meal. Please make overdue appt with Dr. Cooper. 3rd attempt 60 tablet 5  . clopidogrel (PLAVIX) 75 MG tablet Take 1 tablet (75 mg total) daily by mouth. Please make appt with Dr. Cooper before anymore refills. 2nd attempt 15 tablet 0  . isosorbide mononitrate (IMDUR) 30 MG 24 hr tablet Take 1 tablet (30 mg total) by mouth daily. Please make overdue appt with Doctor before anymore refills. 2nd attempt 15 tablet 0  . levothyroxine (SYNTHROID, LEVOTHROID) 88 MCG tablet Take 1 tablet (88 mcg total) by mouth daily before breakfast. 90 tablet 3  . lisinopril-hydrochlorothiazide (PRINZIDE,ZESTORETIC) 20-12.5 MG tablet Take 2 tablets by mouth daily. 180 tablet 0  . loratadine (CLARITIN) 10 MG tablet Take 1 tablet (10 mg total) by mouth daily. 30 tablet 5  . metFORMIN (GLUCOPHAGE-XR) 500 MG 24 hr tablet TAKE TWO TABLETS BY MOUTH ONCE DAILY WITH  BREAKFAST 180 tablet 3  . nitroGLYCERIN (NITROSTAT) 0.4 MG SL tablet PLACE ONE TABLET UNDER THE TONGUE EVERY FIVE MINUTES AS NEEDED FOR CHEST PAIN. UP TO 3 DOSES 75 tablet 2  . spironolactone (ALDACTONE) 25 MG tablet Take 1 tablet (25 mg total) by mouth daily. 30 tablet 5   No current facility-administered medications for this visit.     Allergies:   Patient has no known allergies.   Social History:  The patient  reports that she has  quit smoking. She has never used smokeless tobacco. She reports that she drinks alcohol. She reports that she does not use drugs.   Family History:  The patient's family history includes Alzheimer's disease in her father; Heart attack in her maternal grandfather; Hypertension in her mother; Osteoarthritis in her mother.    ROS:  Please see the history of present illness.  Otherwise, review of systems is positive for wheezing, back pain.  All other systems are reviewed and negative.    PHYSICAL EXAM: VS:  BP 136/86   Pulse 60   Ht 5' 4.5" (1.638 m)   Wt 213 lb (96.6 kg)   SpO2 97%     BMI 36.00 kg/m  , BMI Body mass index is 36 kg/m. GEN: Well nourished, well developed, in no acute distress  HEENT: normal  Neck: no JVD, no masses. No carotid bruits Cardiac: RRR without murmur or gallop     Respiratory:  Scattered rhonchi bilaterally, normal work of breathing GI: soft, nontender, nondistended, + BS MS: no deformity or atrophy  Ext: no pretibial edema Skin: warm and dry, no rash Neuro:  Strength and sensation are intact Psych: euthymic mood, full affect  EKG:  EKG is ordered today. The ekg ordered today shows NSR 60 bpm, nonspecific T wave abnormality, no significant change from previous.  Recent Labs: 05/02/2016: ALT 12; BUN 13; Creatinine, Ser 0.84; Potassium 3.6; Sodium 143 10/04/2016: Hemoglobin 11.4; Platelets 279; TSH 2.760   Lipid Panel     Component Value Date/Time   CHOL 107 01/26/2015 1410   TRIG 134 01/26/2015 1410   HDL 32 (L) 01/26/2015 1410   CHOLHDL 3.3 01/26/2015 1410   CHOLHDL 6 08/25/2014 0741   VLDL 26.8 08/25/2014 0741   LDLCALC 48 01/26/2015 1410   LDLDIRECT 196.8 07/11/2010 1107      Wt Readings from Last 3 Encounters:  04/28/17 213 lb (96.6 kg)  03/20/17 216 lb 14.4 oz (98.4 kg)  02/13/17 216 lb 8 oz (98.2 kg)     Cardiac Studies Reviewed: Cardiac Cath 07-10-2015: Conclusion    Prox RCA lesion, 100% stenosed. Known chronic occlusion - not  imaged  Prox LAD to Mid LAD lesion, 25% stenosed. stable  Widely patent Circumflex-OM1 stent  Severe systemic hypertension with elevated LVEDP of 40 mmHg.    I don't see any angiographic evidence of any new disease compared to last catheterization. The stent is widely patent. She did have an LVEDP - however she had not been receiving her ACE inhibitor or calcium channel blocker while in the hospital.  Plan: Return to nursing unit for TR band removal Restart home blood pressure medications Expect that she should be discharged tomorrow from a cardiac standpoint. Otherwise continue current home medications.   David Harding, M.D., M.S. Interventional Cardiologist   Pager # [phone] Phone # [phone] [address] [place], [place] [address]    Indications   Unstable angina (HCC) [I20.0 (ICD-10-CM)]  Accelerated hypertension [I10 (ICD-10-CM)]  CAD S/P percutaneous coronary angioplasty [I25.10, Z98.61 (ICD-10-CM)]  Complications   Complications documented in old activity   Primary Physician: Hoffman, Erik C, DO  Reason for Consult: Chest Pain  Primary Cardiologist: Dr. Cooper  Requesting Provider: Dr. Granfortuna      Tricia Dunson is a 72 y.o. female with past medical history of CAD (s/p DES to OM1 in 07/2014, recent cath in 02/2015 showing CTO if RCA with collaterals present and severe stenosis of LCx with Synergy 2.75 mm x 12 mm DES placed), HLD, HTN, Type 2 DM, and past TIA's who presented to [place] ED on 07/08/2015 for a syncopal event.   She also noted to have some chest pain & shortness of breath -- SSx concerning for Unstable Angina.   She is therefore referred for invasive cardiac evaluation with cardiac catheterization & possible PCI.   PROCEDURE  Estimated blood loss <50 mL. There were no immediate complications during the procedure. Time Out: Verified patient identification, verified procedure, site/side was marked, verified correct  patient position, special equipment/implants available, medications/allergies/relevent history reviewed, required imaging and test results available. Performed. Consent Signed.   During this procedure the patient is administered a total of Versed 2   mg and Fentanyl 100 mg to achieve and maintain moderate conscious sedation. The patient's heart rate, blood pressure, and oxygen saturation are monitored continuously during the procedure. The period of conscious sedation is 46 minutes, of which I was present face-to-face 100% of this time.   Access:  RIGHT Radial Artery: 6 Fr sheath -- Seldinger technique using Angiocath Micropuncture Kit  10 mL radial cocktail IA; 5000 Units IV Heparin   Left Heart Catheterization: 5Fr Catheters advanced or exchanged over a J-wire under direct fluoroscopic guidance into the ascending aorta; TIG 4.0 catheter advanced first.  Left Coronary Artery Cineangiography: TIG 4.0 Catheter  Right Coronary Artery, SVG-RCA & SVG-OM Cineangiography: TIG 4.0 Catheter  LIMA-LAD Cineangiography: TIG 4.0 Catheter redirected into Left Subclavian Artery & exchanged over long-exchange wire for IMA catheter.  LV Hemodynamics: TIG 4.0   Diagnostic angiography revealed widely patent stent in the circumflex, with otherwise stable disease from before.   RADIAL Sheath(s) removed in the Cardiac Cath Lab with TR band applied for hemostasis.   TR Band: 1730 Hours; 11 mL air   Fluoroscopy time 517, total radiation 17, 893 DAP   MEDICATIONS  * 100 g IV fentanyl, 2 mgIV Versed  * SQ Lidocaine 2m  * Radial Cocktail: 3 mg Verapamil in 10 mL NS  * Isovue Contrast: 80  * Heparin: 5000 Units  * IA NTG 200 mcg x 2  * IV Labetalol 20 mg x 1  * IV Hydralazine 20 mg x 1      Coronary Findings   Diagnostic  Dominance: Right  Left Main  . Vessel is large.  Ost LM lesion 25% stenosed  Calcified.  Left Anterior Descending  Prox LAD to Mid LAD lesion 25% stenosed  Mild diffuse  calcified LAD stenosis  First Diagonal Branch  The vessel is moderate in size.  First Septal Branch  The vessel is moderate in size.  Second Diagonal Branch  The vessel is small in size.  Second Septal Branch  The vessel is small in size.  Third Diagonal Branch  The vessel is small in size.  Third Septal Branch  The vessel is small in size.  Left Circumflex  . Vessel is large.  Prox Cx lesion 0% stenosed  Previously placed Prox Cx drug eluting stent is patent. 2.75 x 12 mm Synergey DES (3.0) DES from proximal Cx into OM1.  First Obtuse Marginal Branch  The vessel is large in size.  Ost 1st Mrg to 1st Mrg lesion 5% stenosed  The lesion was previously treated with a drug-eluting stent. DES from pCx into OM2  Second Obtuse Marginal Branch  The vessel is moderate in size.  Lateral Second Obtuse Marginal Branch  The vessel is small in size.  Right Coronary Artery  . Vessel is small. Not selectively injected. Known to be occluded  Collaterals  Dist RCA filled by collaterals from 1st Sept.    Prox RCA lesion 100% stenosed  Known total occlusion with left-right collaterals  Mid RCA to Dist RCA lesion 100% stenosed  Diffuse chronic total occlusion.  Inferior Septal  Collaterals  Inf Sept filled by collaterals from 1st Sept.    Intervention   No interventions have been documented.  Wall Motion      No LV Gram - LVEDP was 40 mmHg          Left Heart   Left Ventricle The ejection fraction could not be assess due to No LV Gam - LVEDP ~40 mmHg.  Aortic Valve  There is no aortic valve stenosis, and no aortic valve regurgitation.  Coronary Diagrams   Diagnostic Diagram       Implants    No implant documentation for this case.  MERGE Images   Show images for Cardiac catheterization   Link to Procedure Log   Procedure Log    Hemo Data    Most Recent Value  AO Systolic Pressure 875 mmHg  AO Diastolic Pressure 68 mmHg  AO Mean 87 mmHg  LV Systolic Pressure 643  mmHg  LV Diastolic Pressure 25 mmHg  LV EDP 41 mmHg  Arterial Occlusion Pressure Extended Systolic Pressure 329 mmHg  Arterial Occlusion Pressure Extended Diastolic Pressure 518 mmHg  Arterial Occlusion Pressure Extended Mean Pressure 147 mmHg  Left Ventricular Apex Extended Systolic Pressure 841 mmHg  Left Ventricular Apex Extended Diastolic Pressure 21 mmHg  Left Ventricular Apex Extended EDP Pressure 47 mmHg    Echo 07-11-2015: Left ventricle:  The cavity size was normal. Wall thickness was increased in a pattern of moderate LVH. Systolic function was normal. The estimated ejection fraction was in the range of 60% to 65%. Wall motion was normal; there were no regional wall motion abnormalities. Doppler parameters are consistent with abnormal left ventricular relaxation (grade 1 diastolic dysfunction).  ------------------------------------------------------------------- Aortic valve:   Mildly thickened, mildly calcified leaflets. Cusp separation was normal.  Doppler:  Transvalvular velocity was within the normal range. There was no stenosis. There was no regurgitation.  ------------------------------------------------------------------- Aorta:  Aortic root: The aortic root was normal in size. Ascending aorta: The ascending aorta was normal in size.  ------------------------------------------------------------------- Mitral valve:   Structurally normal valve.   Leaflet separation was normal.  Doppler:  Transvalvular velocity was within the normal range. There was no evidence for stenosis. There was no regurgitation.    Peak gradient (D): 2 mm Hg.  ------------------------------------------------------------------- Left atrium:  The atrium was normal in size.  ------------------------------------------------------------------- Right ventricle:  The cavity size was normal. Systolic function  was normal.  ------------------------------------------------------------------- Pulmonic valve:    Structurally normal valve.   Cusp separation was normal.  Doppler:  Transvalvular velocity was within the normal range. There was no regurgitation.  ------------------------------------------------------------------- Tricuspid valve:   Structurally normal valve.   Leaflet separation was normal.  Doppler:  Transvalvular velocity was within the normal range. There was no regurgitation.  ------------------------------------------------------------------- Right atrium:  The atrium was normal in size.  ------------------------------------------------------------------- Pericardium:  There was no pericardial effusion.  ------------------------------------------------------------------- Systemic veins: Inferior vena cava: The vessel was normal in size. The respirophasic diameter changes were in the normal range (>= 50%), consistent with normal central venous pressure.  ASSESSMENT AND PLAN: 1.  CAD, native vessel, with angina: The patient's symptoms appear to be reasonably well controlled.  She is not having any exertional chest pain or pressure.  She will continue on aspirin and clopidogrel, statin agent, and antianginal program that includes isosorbide, carvedilol, and amlodipine.    2.  Hypertension: Blood pressure is controlled with amlodipine, carvedilol, isosorbide, lisinopril, hydrochlorothiazide, and Spironolactone.  3.  Hyperlipidemia: Treated with a high intensity statin drug using atorvastatin 80 mg daily.  4.  Type 2 diabetes: Treated with metformin.  Followed by her primary physician.  Discussed lifestyle modification, diet, and exercise measures today.  Current medicines are reviewed with the patient today.  The patient does not have concerns regarding medicines.  Labs/ tests ordered today include:   Orders Placed This Encounter  Procedures  . EKG 12-Lead     Disposition:  FU 6 months APP  Signed, Sherren Mocha, MD  04/30/2017 2:38 PM    [address], [place], [place]  [address] Phone: [phone]; Fax: [phone]

## 2017-05-05 ENCOUNTER — Ambulatory Visit: Payer: Self-pay

## 2017-05-20 ENCOUNTER — Other Ambulatory Visit: Payer: Self-pay | Admitting: *Deleted

## 2017-05-20 DIAGNOSIS — E038 Other specified hypothyroidism: Secondary | ICD-10-CM

## 2017-05-20 MED ORDER — LEVOTHYROXINE SODIUM 88 MCG PO TABS: 88.0000 ug | ORAL_TABLET | Freq: Every day | ORAL | 3 refills | Status: DC

## 2017-05-20 MED ORDER — ATORVASTATIN CALCIUM 80 MG PO TABS: 80.0000 mg | ORAL_TABLET | Freq: Every day | ORAL | 3 refills | Status: DC

## 2017-06-20 ENCOUNTER — Inpatient Hospital Stay: Admission: RE | Admit: 2017-06-20 | Payer: Self-pay | Source: Ambulatory Visit

## 2017-08-04 ENCOUNTER — Ambulatory Visit
Admission: RE | Admit: 2017-08-04 | Discharge: 2017-08-04 | Disposition: A | Discharge status: Home / Self Care | Payer: Medicare Other | Source: Ambulatory Visit | Attending: Internal Medicine | Admitting: Internal Medicine

## 2017-08-04 DIAGNOSIS — H04123 Dry eye syndrome of bilateral lacrimal glands: Secondary | ICD-10-CM | POA: Diagnosis not present

## 2017-08-04 DIAGNOSIS — H1851 Endothelial corneal dystrophy: Secondary | ICD-10-CM | POA: Diagnosis not present

## 2017-08-04 DIAGNOSIS — E119 Type 2 diabetes mellitus without complications: Secondary | ICD-10-CM | POA: Diagnosis not present

## 2017-08-04 DIAGNOSIS — Z1231 Encounter for screening mammogram for malignant neoplasm of breast: Secondary | ICD-10-CM

## 2017-08-04 DIAGNOSIS — H01021 Squamous blepharitis right upper eyelid: Secondary | ICD-10-CM | POA: Diagnosis not present

## 2017-08-04 DIAGNOSIS — H2513 Age-related nuclear cataract, bilateral: Secondary | ICD-10-CM | POA: Diagnosis not present

## 2017-08-04 LAB — HM DIABETES EYE EXAM

## 2017-11-03 ENCOUNTER — Other Ambulatory Visit: Payer: Self-pay | Admitting: Internal Medicine

## 2017-11-03 DIAGNOSIS — J309 Allergic rhinitis, unspecified: Secondary | ICD-10-CM

## 2017-11-05 ENCOUNTER — Other Ambulatory Visit: Payer: Self-pay | Admitting: *Deleted

## 2017-11-05 DIAGNOSIS — E1151 Type 2 diabetes mellitus with diabetic peripheral angiopathy without gangrene: Secondary | ICD-10-CM

## 2017-11-05 MED ORDER — METFORMIN HCL ER 500 MG PO TB24: ORAL_TABLET | ORAL | 3 refills | Status: DC

## 2017-11-05 NOTE — Telephone Encounter (Signed)
Appt sch with PCP Dr Joni Reining 12/25/2017.  Pt has been notified.

## 2017-12-11 ENCOUNTER — Encounter: Payer: Self-pay | Admitting: Internal Medicine

## 2017-12-25 ENCOUNTER — Encounter: Payer: Self-pay | Admitting: Internal Medicine

## 2018-01-22 ENCOUNTER — Other Ambulatory Visit: Payer: Self-pay

## 2018-01-22 ENCOUNTER — Encounter: Payer: Self-pay | Admitting: Internal Medicine

## 2018-01-22 ENCOUNTER — Ambulatory Visit (INDEPENDENT_AMBULATORY_CARE_PROVIDER_SITE_OTHER): Payer: Medicare Other | Admitting: Internal Medicine

## 2018-01-22 VITALS — BP 219/98 | HR 64 | Temp 98.1°F | Ht 64.0 in | Wt 217.2 lb

## 2018-01-22 DIAGNOSIS — J302 Other seasonal allergic rhinitis: Secondary | ICD-10-CM

## 2018-01-22 DIAGNOSIS — E1151 Type 2 diabetes mellitus with diabetic peripheral angiopathy without gangrene: Secondary | ICD-10-CM

## 2018-01-22 DIAGNOSIS — I10 Essential (primary) hypertension: Secondary | ICD-10-CM | POA: Diagnosis not present

## 2018-01-22 DIAGNOSIS — R5382 Chronic fatigue, unspecified: Secondary | ICD-10-CM

## 2018-01-22 DIAGNOSIS — Z79899 Other long term (current) drug therapy: Secondary | ICD-10-CM

## 2018-01-22 DIAGNOSIS — E039 Hypothyroidism, unspecified: Secondary | ICD-10-CM | POA: Diagnosis not present

## 2018-01-22 DIAGNOSIS — E038 Other specified hypothyroidism: Secondary | ICD-10-CM

## 2018-01-22 DIAGNOSIS — Z79890 Hormone replacement therapy (postmenopausal): Secondary | ICD-10-CM

## 2018-01-22 DIAGNOSIS — J309 Allergic rhinitis, unspecified: Secondary | ICD-10-CM

## 2018-01-22 DIAGNOSIS — G4733 Obstructive sleep apnea (adult) (pediatric): Secondary | ICD-10-CM

## 2018-01-22 DIAGNOSIS — Z7984 Long term (current) use of oral hypoglycemic drugs: Secondary | ICD-10-CM

## 2018-01-22 LAB — POCT GLYCOSYLATED HEMOGLOBIN (HGB A1C): Hemoglobin A1C: 6.9 % — AB (ref 4.0–5.6)

## 2018-01-22 LAB — GLUCOSE, CAPILLARY: Glucose-Capillary: 123 mg/dL — ABNORMAL HIGH (ref 70–99)

## 2018-01-22 MED ORDER — ATORVASTATIN CALCIUM 80 MG PO TABS: 80.0000 mg | ORAL_TABLET | Freq: Every day | ORAL | 3 refills | Status: DC

## 2018-01-22 MED ORDER — AMLODIPINE BESYLATE 5 MG PO TABS: 5.0000 mg | ORAL_TABLET | Freq: Every day | ORAL | 3 refills | Status: DC

## 2018-01-22 MED ORDER — VALSARTAN-HYDROCHLOROTHIAZIDE 320-25 MG PO TABS: 1.0000 | ORAL_TABLET | Freq: Every day | ORAL | 3 refills | Status: DC

## 2018-01-22 MED ORDER — FLUTICASONE PROPIONATE 50 MCG/ACT NA SUSP: 1.0000 | Freq: Every day | NASAL | 2 refills | Status: AC

## 2018-01-22 NOTE — Progress Notes (Addendum)
Subjective:  HPI: [name] is a 72 y.o. female who presents for follow up HTN  Please see Assessment and Plan below for the status of her chronic medical problems.  Review of Systems: Review of Systems  Constitutional: Negative for malaise/fatigue and weight loss.  Respiratory: Negative for cough.   Cardiovascular: Negative for chest pain.  Genitourinary: Negative for dysuria, flank pain, frequency, hematuria and urgency.  Neurological: Negative for dizziness.  Psychiatric/Behavioral: Negative for depression.    Objective:  Physical Exam: Vitals:   01/22/18 0928  BP: (!) 219/98  Pulse: 64  Temp: 98.1 F (36.7 C)  TempSrc: Oral  SpO2: 98%  Weight: 217 lb 3.2 oz (98.5 kg)  Height: 5\' 4"  (1.626 m)   Physical Exam  Constitutional: She is well-developed, well-nourished, and in no distress.  HENT:  Swollen inferior turbinates  Cardiovascular: Normal rate, regular rhythm and normal heart sounds.  Pulmonary/Chest: Effort normal.  Abdominal: Soft. Bowel sounds are normal.  Musculoskeletal:        General: No edema.  Psychiatric: Affect and judgment normal.  Nursing note and vitals reviewed.  Assessment & Plan:  Essential hypertension HPI: She denies any headache changes in vision she does admit to occasional dyspnea on exertion but no current shortness of breath.  She reports that she takes "all my blood pressure medications."  However she cannot name them to me now when I relay blood pressure medications to her she always has I take my blood pressure medications.  In review of her chart it looks like most prescriptions are up-to-date however I noticed that lisinopril hydrochlorothiazide has likely been expired for at least several months.  Her blood pressure is uncontrolled today.  Assessment: Essential hypertension uncontrolled  Plan -P is markedly elevated however she is asymptomatic.  I suspect she has not been taking her lisinopril hydrochlorothiazide as it had not  been prescribed in about a year.  I would over her formulary to try to find what combination medications would be available to and affordable for her to take.  I will have her start the new regimen as follows. -Valsartan-HCTZ 320-25 mg 1 tablet daily -Spironolactone 25 mg daily -Imdur 30 mg daily Amlodipine 5 mg daily -Carvedilol 12.5 mg twice daily  Have asked her to bring all of her medications to a follow-up appointment in the [facility] in a few weeks and I will plan to see her back in about 3 months. -Check BMP today.  DM (diabetes mellitus), type 2 with peripheral vascular complications (HCC) HPI: No polydipsia or polyuria otherwise reports doing well with metformin XR 500 mg twice daily  Assessment controlled type 2 diabetes with peripheral vascular disease  Plan Continue metformin xr 500 mg twice daily  Hypothyroidism Due for repeat TSH reports taking levothyroxine 88 mcg daily.  Allergic rhinitis HPI: Reports constant runny nose occasional cough at night.  Has not been taking any sort of nasal spray.  On exam swollen inferior turbinates and posterior oropharynx edema.  Assessment allergic rhinitis  Plan start Flonase  Mild obstructive sleep apnea HPI: She has had mild obstructive sleep apnea with an AHI of 7.5 in 2015 she never went back for her CPAP titration.  Complains of chronic fatigue, hypertension is uncontrolled.  Assessment: Mild OSA with symptoms of chronic fatigue and uncontrolled hypertension  Plan: I will try to order her a auto titration CPAP machine.  If this is not approved by insurance we may need a home sleep study however given that she already had  diagnosed mild OSA and has only gained weight I do not think this is a good utilization of resources.   Medications Ordered Meds ordered this encounter  Medications  . valsartan-hydrochlorothiazide (DIOVAN-HCT) 320-25 MG tablet    Sig: Take 1 tablet by mouth daily.    Dispense:  90 tablet    Refill:  3     D/C Lisinopril-HCTZ  . amLODipine (NORVASC) 5 MG tablet    Sig: Take 1 tablet (5 mg total) by mouth daily.    Dispense:  90 tablet    Refill:  3  . atorvastatin (LIPITOR) 80 MG tablet    Sig: Take 1 tablet (80 mg total) by mouth daily.    Dispense:  90 tablet    Refill:  3  . fluticasone (FLONASE) 50 MCG/ACT nasal spray    Sig: Place 1 spray into both nostrils daily.    Dispense:  16 g    Refill:  2   Other Orders Orders Placed This Encounter  Procedures  . For home use only DME continuous positive airway pressure (CPAP)    Order Specific Question:   Patient has OSA or probable OSA    Answer:   Yes    Order Specific Question:   Is the patient currently using CPAP in the home    Answer:   No    Order Specific Question:   Date of face to face encounter    Answer:   01/22/18    Order Specific Question:   Settings    Answer:   Autotitration    Order Specific Question:   Signs and symptoms of probable OSA  (select all that apply)    Answer:   Snoring    Order Specific Question:   Signs and symptoms of probable OSA  (select all that apply)    Answer:   Choking    Order Specific Question:   CPAP supplies needed    Answer:   Mask, headgear, cushions, filters, heated tubing and water chamber  . Glucose, capillary  . Lipid Profile  . BMP8+Anion Gap  . TSH  . POC Hbg A1C   Follow Up: Return f/u 3 months with Dr Heber [place] follow up 4 weeks in [facility] for BP check.

## 2018-01-22 NOTE — Assessment & Plan Note (Signed)
HPI: She denies any headache changes in vision she does admit to occasional dyspnea on exertion but no current shortness of breath.  She reports that she takes "all my blood pressure medications."  However she cannot name them to me now when I relay blood pressure medications to her she always has I take my blood pressure medications.  In review of her chart it looks like most prescriptions are up-to-date however I noticed that lisinopril hydrochlorothiazide has likely been expired for at least several months.  Her blood pressure is uncontrolled today.  Assessment: Essential hypertension uncontrolled  Plan -P is markedly elevated however she is asymptomatic.  I suspect she has not been taking her lisinopril hydrochlorothiazide as it had not been prescribed in about a year.  I would over her formulary to try to find what combination medications would be available to and affordable for her to take.  I will have her start the new regimen as follows. -Valsartan-HCTZ 320-25 mg 1 tablet daily -Spironolactone 25 mg daily -Imdur 30 mg daily Amlodipine 5 mg daily -Carvedilol 12.5 mg twice daily  Have asked her to bring all of her medications to a follow-up appointment in the [facility] in a few weeks and I will plan to see her back in about 3 months. -Check BMP today.

## 2018-01-22 NOTE — Assessment & Plan Note (Signed)
Due for repeat TSH reports taking levothyroxine 88 mcg daily.

## 2018-01-22 NOTE — Assessment & Plan Note (Signed)
HPI: Reports constant runny nose occasional cough at night.  Has not been taking any sort of nasal spray.  On exam swollen inferior turbinates and posterior oropharynx edema.  Assessment allergic rhinitis  Plan start Flonase

## 2018-01-22 NOTE — Assessment & Plan Note (Signed)
HPI: No polydipsia or polyuria otherwise reports doing well with metformin XR 500 mg twice daily  Assessment controlled type 2 diabetes with peripheral vascular disease  Plan Continue metformin xr 500 mg twice daily

## 2018-01-22 NOTE — Addendum Note (Signed)
Addended by: Joni Reining C on: 01/22/2018 04:08 PM   Modules accepted: Orders

## 2018-01-22 NOTE — Assessment & Plan Note (Signed)
HPI: She has had mild obstructive sleep apnea with an AHI of 7.5 in 2015 she never went back for her CPAP titration.  Complains of chronic fatigue, hypertension is uncontrolled.  Assessment: Mild OSA with symptoms of chronic fatigue and uncontrolled hypertension  Plan: I will try to order her a auto titration CPAP machine.  If this is not approved by insurance we may need a home sleep study however given that she already had diagnosed mild OSA and has only gained weight I do not think this is a good utilization of resources.

## 2018-01-23 ENCOUNTER — Telehealth: Payer: Self-pay | Admitting: Internal Medicine

## 2018-01-23 LAB — LIPID PANEL
Chol/HDL Ratio: 2.9 ratio (ref 0.0–4.4)
Cholesterol, Total: 110 mg/dL (ref 100–199)
HDL: 38 mg/dL — ABNORMAL LOW (ref >39)
LDL Calculated: 58 mg/dL (ref 0–99)
Triglycerides: 69 mg/dL (ref 0–149)
VLDL Cholesterol Cal: 14 mg/dL (ref 5–40)

## 2018-01-23 LAB — BMP8+ANION GAP
Anion Gap: 15 mmol/L (ref 10.0–18.0)
BUN/Creatinine Ratio: 13 (ref 12–28)
BUN: 11 mg/dL (ref 8–27)
CO2: 25 mmol/L (ref 20–29)
Calcium: 9.4 mg/dL (ref 8.7–10.3)
Chloride: 103 mmol/L (ref 96–106)
Creatinine, Ser: 0.84 mg/dL (ref 0.57–1.00)
GFR calc Af Amer: 81 mL/min/{1.73_m2} (ref >59)
GFR calc non Af Amer: 71 mL/min/{1.73_m2} (ref >59)
Glucose: 128 mg/dL — ABNORMAL HIGH (ref 65–99)
Potassium: 4 mmol/L (ref 3.5–5.2)
Sodium: 143 mmol/L (ref 134–144)

## 2018-01-23 LAB — TSH: TSH: 5.53 u[IU]/mL — ABNORMAL HIGH (ref 0.450–4.500)

## 2018-01-23 NOTE — Telephone Encounter (Signed)
Attempted to call [name] on home and cell for lab results.  Overall labs are excellent. Her thyroid level is a little low (High TSH), if she has not been taking her levothyroxine 13mcg every day (missing days) then that could be the explaination otherwise if she has been taking every day I will have her increase to 142mcg each day dose.

## 2018-02-06 ENCOUNTER — Telehealth: Payer: Self-pay | Admitting: *Deleted

## 2018-02-06 DIAGNOSIS — G4733 Obstructive sleep apnea (adult) (pediatric): Secondary | ICD-10-CM

## 2018-02-06 NOTE — Telephone Encounter (Signed)
Call made to [facility] ordered CPAP with auto titration. Per [facility],  pt was never on CPAP therapy, insurance will not cover cpap based on results from 2015 sleep study-pt will need a new study. Pt did not meet split night protocol during her last study, so it would be advisable to order a nocturnal polysomnography.  Once results are received, MD can use current cpap with auto titration order, along with sleep study results and send to [facility].  Pt was contacted, made aware and is in agreement with plan. Will send order to MD for review and signature, if appropriate.Regenia Skeeter, Toneisha Savary Cassady1/3/20203:36 PM

## 2018-02-09 NOTE — Telephone Encounter (Signed)
Agree, order placed

## 2018-02-25 ENCOUNTER — Other Ambulatory Visit: Payer: Self-pay | Admitting: Cardiology

## 2018-03-20 ENCOUNTER — Encounter (HOSPITAL_BASED_OUTPATIENT_CLINIC_OR_DEPARTMENT_OTHER): Payer: Self-pay

## 2018-05-08 ENCOUNTER — Encounter (HOSPITAL_BASED_OUTPATIENT_CLINIC_OR_DEPARTMENT_OTHER): Payer: Self-pay

## 2018-05-22 ENCOUNTER — Other Ambulatory Visit: Payer: Self-pay | Admitting: *Deleted

## 2018-05-22 DIAGNOSIS — E038 Other specified hypothyroidism: Secondary | ICD-10-CM

## 2018-05-22 MED ORDER — LEVOTHYROXINE SODIUM 88 MCG PO TABS: 88.0000 ug | ORAL_TABLET | Freq: Every day | ORAL | 3 refills | Status: DC

## 2018-06-16 ENCOUNTER — Ambulatory Visit (INDEPENDENT_AMBULATORY_CARE_PROVIDER_SITE_OTHER): Payer: Medicare Other | Admitting: Internal Medicine

## 2018-06-16 ENCOUNTER — Other Ambulatory Visit: Payer: Self-pay

## 2018-06-16 DIAGNOSIS — E038 Other specified hypothyroidism: Secondary | ICD-10-CM

## 2018-06-16 DIAGNOSIS — G4733 Obstructive sleep apnea (adult) (pediatric): Secondary | ICD-10-CM

## 2018-06-16 DIAGNOSIS — I10 Essential (primary) hypertension: Secondary | ICD-10-CM | POA: Diagnosis not present

## 2018-06-16 DIAGNOSIS — E1151 Type 2 diabetes mellitus with diabetic peripheral angiopathy without gangrene: Secondary | ICD-10-CM | POA: Diagnosis not present

## 2018-06-16 MED ORDER — LEVOTHYROXINE SODIUM 88 MCG PO TABS: 88.0000 ug | ORAL_TABLET | Freq: Every day | ORAL | 3 refills | Status: DC

## 2018-06-16 MED ORDER — SPIRONOLACTONE 25 MG PO TABS: 25.0000 mg | ORAL_TABLET | Freq: Every day | ORAL | 0 refills | Status: DC

## 2018-06-16 MED ORDER — ISOSORBIDE MONONITRATE ER 30 MG PO TB24: 30.0000 mg | ORAL_TABLET | Freq: Every day | ORAL | 0 refills | Status: DC

## 2018-06-16 NOTE — Progress Notes (Signed)
CC: follow up of hypertension   This is a telephone encounter between [employer] and [name] on 06/16/2018 for follow up of hypertension. The visit was conducted with the patient located at home and [name] at [facility]. The patient's identity was confirmed using their DOB and current address. The patient has consented to being evaluated through a telephone encounter and understands the associated risks (an examination cannot be done and the patient may need to come in for an appointment) / benefits (allows the patient to remain at home, decreasing exposure to coronavirus). I personally spent 25 minutes on medical discussion.   HPI:  [name] is a 73 y.o. with PMH as below.   Please see A&P for assessment of the patient's acute and chronic medical conditions.   Past Medical History:  Diagnosis Date  . Back pain    reason unknown  . CAD (coronary artery disease)    a. 07/2014: DES to OM1  b. 02/2015: CTO to RCA   . Cervical lymphadenopathy   . Cervical polyp   . Coronary artery disease    Prior stent of the lCX with chronic total occlusion of a small RCA. Last cath in 2004 showing patent stent, nonobstructive disease and total RCA. EF is 65 to 70%. She has been managed medically.   . Depression    sees a counselor at [facility] but no meds  . Diabetes mellitus without complication ([place])    takes Metformin daily  . History of colon polyps   . Hyperlipidemia    takes Crestor daily  . Hypertension    takes Amlodipine,Coreg,and Prinzide daily  . Hypothyroidism    takes Synthroid daily  . Neck mass   . OSA (obstructive sleep apnea)    PT DENIES, STATES SHE WAS TOLD SHE DIDN'T HAVE OSA, NO CPAP  . PTSD (post-traumatic stress disorder)   . Renal artery stenosis ([place]) 2007  . Stroke ([place])    TIA- LAST ONE GREATER THAN 10 YEARS PER PT  . TIA (transient ischemic attack)   . Tubular adenoma of rectum 10/24/04  . Umbilical hernia   . Urinary tract infection    Review of  Systems:  Refer to history of present illness and assessment and plans for pertinent review of systems, all others reviewed and negative  Assessment & Plan:   Hypertension  At last office visit patient was noted to have uncontrolled hypertension. A review of her medications showed that one hadnt been filled for the last year. Primary care was concerned about all of the prescribed medications not being at play. She was started on a new regiment in an attempt to ease her ability to afford the medications. The newly prescribed medications included valsartan- HCTZ 320-25 mg daily, spironolactone 25 mg daily, imdur 30 mg daily, and amlodipine 5 mg daily, and carvedilol 12.5 mg daily.   Patient reports that she doesn't have a blood pressure cuff at home but usually checks when she goes to the drug store. The last time she did this was prior to the covid pandemic, she cant remember the pressure at the time but remembers that it was "high". She does state that she organizes her medications in a pill organizer, she usually takes them first thing in the morning when she wakes up and doesn't forget to take them unless she has an emergency.   - we went through medications that [name] currently has at home, these include carvedilol 12.5 BID, valsartan- HCTZ 320-25 mg  qd, amlodipine 5 mg qd.  - She has not been taking imdur which is listed on her medication list but hasnt been refilled since 2018. She doesn't have the spironolactone either. We discussed good rx prices and coupons to find the most affordable options. She request for these Rx to be sent to [place].  - will schedule in person follow up in one month    Hypothyroidism  Patient was found to have an elevated TSH at lab visit. She was prescribed levothyroxine 88 mcg daily. She believes that she has been out of it for a while which explains the elevated TSH.  - refilled synthroid 88 mcg per day   Diabetes  Diabetes is well controlled, last  hemoglobin A1c was 6.9 when checked 01/2018.  -continue metformin xr 1000 mg qd  - follow up of Hemoglobin A1c can wait until follow up for hypertension in one month   Mild OSA  Pt has dx of mild OSA with AHI 7.5 on last sleep study. She describes chronic fatigue and was found to have uncontrolled hypertension as mentioned above.  - patient has dx mild OSA, order for nocturnal polysomnography placed at last office visit and scheduled for 07/10/2018   See Encounters Tab for problem based charting.  Patient discussed with Dr. Daryll Drown

## 2018-06-18 ENCOUNTER — Telehealth: Payer: Self-pay | Admitting: *Deleted

## 2018-06-18 NOTE — Telephone Encounter (Signed)
SPOKE WIT PT AND PT CONSENTED TO TELEPHONE ONLY HAVE LANDLINE   Confirm consent - "In the setting of the current Covid19 crisis, you are scheduled for a (phone or video) visit with your provider on (date) at (time).  Just as we do with many in-office visits, in order for you to participate in this visit, we must obtain consent.  If you'd like, I can send this to your mychart (if signed up) or email for you to review.  Otherwise, I can obtain your verbal consent now.  All virtual visits are billed to your insurance company just like a normal visit would be.  By agreeing to a virtual visit, we'd like you to understand that the technology does not allow for your provider to perform an examination, and thus may limit your provider's ability to fully assess your condition. If your provider identifies any concerns that need to be evaluated in person, we will make arrangements to do so.  Finally, though the technology is pretty good, we cannot assure that it will always work on either your or our end, and in the setting of a video visit, we may have to convert it to a phone-only visit.  In either situation, we cannot ensure that we have a secure connection.  Are you willing to proceed?" STAFF: Did the patient verbally acknowledge consent to telehealth visit? Document YES/NO here: YES   TELEPHONE CALL NOTE  [name] has been deemed a candidate for a follow-up tele-health visit to limit community exposure during the Covid-19 pandemic. I spoke with the patient via phone to ensure availability of phone/video source, confirm preferred email & phone number, and discuss instructions and expectations.  I reminded [name] to be prepared with any vital sign and/or heart rhythm information that could potentially be obtained via home monitoring, at the time of her visit. I reminded [name] to expect a phone call prior to her visit.  [name], [name] 06/18/2018 11:41 AM   FULL LENGTH CONSENT FOR  TELE-HEALTH VISIT   I hereby voluntarily request, consent and authorize [facility] and its employed or contracted physicians, physician assistants, nurse practitioners or other licensed health care professionals (the Practitioner), to provide me with telemedicine health care services (the "Services") as deemed necessary by the treating Practitioner. I acknowledge and consent to receive the Services by the Practitioner via telemedicine. I understand that the telemedicine visit will involve communicating with the Practitioner through live audiovisual communication technology and the disclosure of certain medical information by electronic transmission. I acknowledge that I have been given the opportunity to request an in-person assessment or other available alternative prior to the telemedicine visit and am voluntarily participating in the telemedicine visit.  I understand that I have the right to withhold or withdraw my consent to the use of telemedicine in the course of my care at any time, without affecting my right to future care or treatment, and that the Practitioner or I may terminate the telemedicine visit at any time. I understand that I have the right to inspect all information obtained and/or recorded in the course of the telemedicine visit and may receive copies of available information for a reasonable fee.  I understand that some of the potential risks of receiving the Services via telemedicine include:  [place] Kitchen Delay or interruption in medical evaluation due to technological equipment failure or disruption; . Information transmitted may not be sufficient (e.g. poor resolution of images) to allow for appropriate medical decision making  by the Practitioner; and/or  . In rare instances, security protocols could fail, causing a breach of personal health information.  Furthermore, I acknowledge that it is my responsibility to provide information about my medical history, conditions and care that is  complete and accurate to the best of my ability. I acknowledge that Practitioner's advice, recommendations, and/or decision may be based on factors not within their control, such as incomplete or inaccurate data provided by me or distortions of diagnostic images or specimens that may result from electronic transmissions. I understand that the practice of medicine is not an exact science and that Practitioner makes no warranties or guarantees regarding treatment outcomes. I acknowledge that I will receive a copy of this consent concurrently upon execution via email to the email address I last provided but may also request a printed copy by calling the office of [facility].    I understand that my insurance will be billed for this visit.   I have read or had this consent read to me. . I understand the contents of this consent, which adequately explains the benefits and risks of the Services being provided via telemedicine.  . I have been provided ample opportunity to ask questions regarding this consent and the Services and have had my questions answered to my satisfaction. . I give my informed consent for the services to be provided through the use of telemedicine in my medical care  By participating in this telemedicine visit I agree to the above.

## 2018-06-18 NOTE — Assessment & Plan Note (Signed)
At last office visit patient was noted to have uncontrolled hypertension. A review of her medications showed that one hadnt been filled for the last year. Primary care was concerned about all of the prescribed medications not being at play. She was started on a new regiment in an attempt to ease her ability to afford the medications. The newly prescribed medications included valsartan- HCTZ 320-25 mg daily, spironolactone 25 mg daily, imdur 30 mg daily, and amlodipine 5 mg daily, and carvedilol 12.5 mg daily.   Patient reports that she doesn't have a blood pressure cuff at home but usually checks when she goes to the drug store. The last time she did this was prior to the covid pandemic, she cant remember the pressure at the time but remembers that it was "high". She does state that she organizes her medications in a pill organizer, she usually takes them first thing in the morning when she wakes up and doesn't forget to take them unless she has an emergency.   - we went through medications that Ms. Fearn currently has at home, these include carvedilol 12.5 BID, valsartan- HCTZ 320-25 mg qd, amlodipine 5 mg qd.  - She has not been taking imdur which is listed on her medication list but hasnt been refilled since 2018. She doesn't have the spironolactone either. We discussed good rx prices and coupons to find the most affordable options. She request for these Rx to be sent to [place].  - will schedule in person follow up in one month

## 2018-06-18 NOTE — Assessment & Plan Note (Signed)
Diabetes is well controlled, last hemoglobin A1c was 6.9 when checked 01/2018.  -continue metformin xr 1000 mg qd  - follow up of Hemoglobin A1c can wait until follow up for hypertension in one month

## 2018-06-18 NOTE — Assessment & Plan Note (Signed)
Pt has dx of mild OSA with AHI 7.5 on last sleep study. She describes chronic fatigue and was found to have uncontrolled hypertension as mentioned above.  - patient has dx mild OSA, order for nocturnal polysomnography placed at last office visit and scheduled for 07/10/2018

## 2018-06-18 NOTE — Assessment & Plan Note (Signed)
Patient was found to have an elevated TSH at lab visit. She was prescribed levothyroxine 88 mcg daily. She believes that she has been out of it for a while which explains the elevated TSH.  - refilled synthroid 88 mcg per day

## 2018-06-18 NOTE — Progress Notes (Signed)
Virtual Visit via Telephone Note   This visit type was conducted due to national recommendations for restrictions regarding the COVID-19 Pandemic (e.g. social distancing) in an effort to limit this patient's exposure and mitigate transmission in our community.  Due to her co-morbid illnesses, this patient is at least at moderate risk for complications without adequate follow up.  This format is felt to be most appropriate for this patient at this time.  The patient did not have access to video technology/had technical difficulties with video requiring transitioning to audio format only (telephone).  All issues noted in this document were discussed and addressed.  No physical exam could be performed with this format.  Please refer to the patient's chart for her  consent to telehealth for [facility].   Date:  06/19/2018   ID:  Clearance Coots, DOB March 23, 1945, MRN [id number]  Patient Location: Home Provider Location: Home  PCP:  Lucious Groves, DO  Cardiologist:  Sherren Mocha, MD   Electrophysiologist:  None   Evaluation Performed:  Follow-Up Visit  Chief Complaint: Follow-up on coronary artery disease, hypertension  History of Present Illness:    Marshal Schrecengost is a 73 y.o. female with coronary artery disease with known chronic total occlusion of the RCA, s/p DES to LCx, diabetes, hyperlipidemia, hypertension, prior TIA.  She was last seen by Dr. Burt Knack in 04/2017.    Today, she notes that she has had one episode of chest discomfort.  This occurred about 3 weeks ago.  She drank some ginger ale, belching and went away.  She has not had any symptoms reminiscent of her previous angina.  She thought she had gained some weight and has noted some increased shortness of breath with exertion.  This has been ongoing for the past month and she feels like it has worsened.  She has not had orthopnea or paroxysmal nocturnal dyspnea.  She did have some lower extremity swelling but this resolved.  She has not  had any syncope.  She has not had any melena or hematochezia.  At times, she feels like she is wheezing.  She has not had any fever.  The patient does not have symptoms concerning for COVID-19 infection (fever, chills, cough, or new shortness of breath).    Past Medical History:  Diagnosis Date   Back pain    reason unknown   CAD (coronary artery disease)    a. 07/2014: DES to OM1  b. 02/2015: CTO to RCA    Cervical lymphadenopathy    Cervical polyp    Coronary artery disease    Prior stent of the lCX with chronic total occlusion of a small RCA. Last cath in 2004 showing patent stent, nonobstructive disease and total RCA. EF is 65 to 70%. She has been managed medically.    Depression    sees a Social worker at [facility] but no meds   Diabetes mellitus without complication ([place])    takes Metformin daily   History of colon polyps    Hyperlipidemia    takes Crestor daily   Hypertension    takes Amlodipine,Coreg,and Prinzide daily   Hypothyroidism    takes Synthroid daily   Neck mass    OSA (obstructive sleep apnea)    PT DENIES, STATES SHE WAS TOLD SHE DIDN'T HAVE OSA, NO CPAP   PTSD (post-traumatic stress disorder)    Renal artery stenosis ([place]) 2007   Stroke ([place])    TIA- LAST ONE GREATER THAN 10 YEARS PER PT  TIA (transient ischemic attack)    Tubular adenoma of rectum 5/85/27   Umbilical hernia    Urinary tract infection    Past Surgical History:  Procedure Laterality Date   ANKLE SURGERY Left    plates and screws   APPENDECTOMY     CARDIAC CATHETERIZATION  2005/2006/2007/2010   CARDIAC CATHETERIZATION N/A 07/18/2014   Procedure: Left Heart Cath and Coronary Angiography;  Surgeon: Lorretta Harp, MD; LAD patent, RCA 100% with L-R collaterals, OM1 80% stenosed proximal to previously placed stent, 30% ISR, EF 55%   CARDIAC CATHETERIZATION N/A 02/13/2015   Procedure: Left Heart Cath and Coronary Angiography;  Surgeon: Sherren Mocha, MD;   Location: [place] CV LAB;  Service: Cardiovascular;  Laterality: N/A;   CARDIAC CATHETERIZATION Right 02/13/2015   Procedure: Coronary Stent Intervention;  Surgeon: Sherren Mocha, MD;  Location: [place] [facility];  Service: Cardiovascular;  Laterality: Right;   CARDIAC CATHETERIZATION N/A 07/10/2015   Procedure: Left Heart Cath and Coronary Angiography;  Surgeon: Leonie Man, MD;  Location: [place] CV LAB;  Service: Cardiovascular;  Laterality: N/A;   COLONOSCOPY  10/2004   CORONARY ANGIOPLASTY     x 1   CORONARY STENT PLACEMENT  1998   NIR 3.0 x 25 to the [place]  07/18/2014   SYNERGY DES 2.5X12 to the [place] N/A 05/27/2013   Procedure: INSERTION OF MESH;  Surgeon: Imogene Burn. Georgette Dover, MD;  Location: [place];  Service: General;  Laterality: N/A;   PERCUTANEOUS CORONARY STENT INTERVENTION (PCI-S)  02/13/2015   des  to proximal circumflex   RENAL ARTERY STENT Bilateral 2007   Dr. Albertine Patricia   UMBILICAL HERNIA REPAIR     UMBILICAL HERNIA REPAIR N/A 05/27/2013   Procedure: HERNIA REPAIR UMBILICAL ADULT;  Surgeon: Imogene Burn. Tsuei, MD;  Location: [facility];  Service: General;  Laterality: N/A;     Current Meds  Medication Sig   aspirin 81 MG tablet Take 1 tablet (81 mg total) by mouth daily.   atorvastatin (LIPITOR) 80 MG tablet Take 1 tablet (80 mg total) by mouth daily.   carvedilol (COREG) 12.5 MG tablet Take 1 tablet (12.5 mg total) by mouth 2 (two) times daily with a meal.   clopidogrel (PLAVIX) 75 MG tablet Take 1 tablet (75 mg total) daily by mouth. Please make appt with Dr. Burt Knack before anymore refills. 2nd attempt   EQ ALLERGY RELIEF 10 MG tablet TAKE 1 TABLET BY MOUTH ONCE DAILY   fluticasone (FLONASE) 50 MCG/ACT nasal spray Place 1 spray into both nostrils daily.   isosorbide mononitrate (IMDUR) 30 MG 24 hr tablet Take 1 tablet (30 mg total) by mouth daily. Please make overdue appt with Doctor before anymore refills. 2nd  attempt   levothyroxine (SYNTHROID) 88 MCG tablet Take 1 tablet (88 mcg total) by mouth daily before breakfast.   metFORMIN (GLUCOPHAGE-XR) 500 MG 24 hr tablet TAKE TWO TABLETS BY MOUTH ONCE DAILY WITH  BREAKFAST   nitroGLYCERIN (NITROSTAT) 0.4 MG SL tablet DISSOLVE ONE TABLET UNDER THE TONGUE EVERY 5 MINUTES AS NEEDED FOR CHEST PAIN.  DO NOT EXCEED A TOTAL OF 3 DOSES IN 15 MINUTES   spironolactone (ALDACTONE) 25 MG tablet Take 1 tablet (25 mg total) by mouth daily.   valsartan-hydrochlorothiazide (DIOVAN-HCT) 320-25 MG tablet Take 1 tablet by mouth daily.   [DISCONTINUED] amLODipine (NORVASC) 5 MG tablet Take 1 tablet (5 mg total) by mouth daily.  Allergies:   Patient has no known allergies.   Social History   Tobacco Use   Smoking status: Former Smoker   Smokeless tobacco: Never Used   Tobacco comment: quit smoking about 30 yrs ago  Substance Use Topics   Alcohol use: Yes    Alcohol/week: 0.0 standard drinks    Comment: Wine on special occasions   Drug use: No     Family Hx: The patient's family history includes Alzheimer's disease in her father; Heart attack in her maternal grandfather; Hypertension in her mother; Osteoarthritis in her mother. There is no history of Cancer, Stroke, Colon cancer, Esophageal cancer, Stomach cancer, or Rectal cancer.  ROS:   Please see the history of present illness.     All other systems reviewed and are negative.   Prior CV studies:   The following studies were reviewed today:  Echo 07/11/2015 Mod LVH, EF 60-65, no RWMA, Gr 1 DD  Cardiac Catheterization 07/10/2015 LM ost 25 LAD prox 25 LCx prox stent into OM2 patent  RCA known to be occluded LVEDP 40    Myoview 01/31/15 Intermediate risk stress nuclear study with small, moderate intensity, reversible distal anterior and basal lateral defects consistent with ischemia in both distributions; EF 63 with normal wall motion.  Carotid US 01/31/14 Bilateral ICA  1-39   Labs/Other Tests and Data Reviewed:    EKG:  No ECG reviewed.  Recent Labs: 01/22/2018: BUN 11; Creatinine, Ser 0.84; Potassium 4.0; Sodium 143; TSH 5.530   Recent Lipid Panel Lab Results  Component Value Date/Time   CHOL 110 01/22/2018 10:26 AM   TRIG 69 01/22/2018 10:26 AM   HDL 38 (L) 01/22/2018 10:26 AM   CHOLHDL 2.9 01/22/2018 10:26 AM   CHOLHDL 6 08/25/2014 07:41 AM   LDLCALC 58 01/22/2018 10:26 AM   LDLDIRECT 196.8 07/11/2010 11:07 AM       Wt Readings from Last 3 Encounters:  06/19/18 205 lb (93 kg)  01/22/18 217 lb 3.2 oz (98.5 kg)  04/28/17 213 lb (96.6 kg)     Objective:    Vital Signs:  BP (!) 180/70    Ht 5' 4.5" (1.638 m)    Wt 205 lb (93 kg)    BMI 34.64 kg/m    VITAL SIGNS:  reviewed GEN:  no acute distress RESPIRATORY:  No labored breathing NEURO:  Alert and oriented PSYCH:  She seems to be in good spirits  ASSESSMENT & PLAN:    SOB (shortness of breath) She notes worsening shortness of breath over the past month.  Her blood pressure is markedly elevated.  She does not have a way to check her blood pressure at home.  She checked it at the store yesterday.  Question if her shortness of breath may be related to uncontrolled hypertension.  I will adjust her blood pressure medications as outlined below.  I will arrange for home visit to get labs to include BMET, CBC and BNP.  If her BNP is elevated, I will place her on Lasix and arrange for a follow-up echocardiogram.  I will follow-up with her in 2 to 3 weeks.  Coronary artery disease involving native coronary artery of native heart with angina pectoris ([place]) History of chronically occluded RCA.  She is status post drug-eluting stent to the LCx in the past.  Cardiac catheterization in 2017 demonstrated patent stent in the LCx.  She has not really had any anginal symptoms.  It does not sound like her shortness of breath is an anginal  equivalent.  Continue aspirin, Plavix, beta-blocker,  statin.  Essential hypertension Blood pressure uncontrolled.  Question if this is related to her shortness of breath.  I will arrange a home visit to obtain labs (BMET, CBC, BNP), check her blood pressure, do a rhythm strip and do an exam.  I will arrange for her to get a blood pressure machine through our [facility].  Increase amlodipine to 10 mg daily.  Follow-up with me in 2 to 3 weeks.  Hyperlipidemia, unspecified hyperlipidemia type LDL optimal on most recent lab work.  Continue current Rx.    DM (diabetes mellitus), type 2 with peripheral vascular complications (HCC) Good control.  Continue follow-up with primary care.  Empagliflozin could be considered given its CV benefits.  Educated About Covid-19 Virus Infection The signs and symptoms of COVID-19 were discussed with the patient and how to seek care for testing (follow up with PCP or arrange E-visit).  The importance of social distancing was discussed today.  Time:   Today, I have spent 22 minutes with the patient with telehealth technology discussing the above problems.     Medication Adjustments/Labs and Tests Ordered: Current medicines are reviewed at length with the patient today.  Concerns regarding medicines are outlined above.   Tests Ordered: BMET, CBC, BNP  Medication Changes: Meds ordered this encounter  Medications   amLODipine (NORVASC) 10 MG tablet    Sig: Take 1 tablet (10 mg total) by mouth daily.    Dispense:  30 tablet    Refill:  11    Dose increase    Order Specific Question:   Supervising Provider    Answer:   Lelon Perla [[id number]]    Disposition:  Follow up in 2 week(s)  Signed, Richardson Dopp, PA-C  06/19/2018 11:58 AM    [place]

## 2018-06-19 ENCOUNTER — Telehealth (INDEPENDENT_AMBULATORY_CARE_PROVIDER_SITE_OTHER): Payer: Medicare Other | Admitting: Physician Assistant

## 2018-06-19 ENCOUNTER — Encounter: Payer: Self-pay | Admitting: Physician Assistant

## 2018-06-19 ENCOUNTER — Telehealth: Payer: Self-pay | Admitting: Licensed Clinical Social Worker

## 2018-06-19 ENCOUNTER — Telehealth: Payer: Self-pay | Admitting: Physician Assistant

## 2018-06-19 ENCOUNTER — Other Ambulatory Visit: Payer: Self-pay

## 2018-06-19 VITALS — BP 180/70 | Ht 64.5 in | Wt 205.0 lb

## 2018-06-19 DIAGNOSIS — I10 Essential (primary) hypertension: Secondary | ICD-10-CM

## 2018-06-19 DIAGNOSIS — R0602 Shortness of breath: Secondary | ICD-10-CM

## 2018-06-19 DIAGNOSIS — Z7189 Other specified counseling: Secondary | ICD-10-CM

## 2018-06-19 DIAGNOSIS — E785 Hyperlipidemia, unspecified: Secondary | ICD-10-CM

## 2018-06-19 DIAGNOSIS — I25119 Atherosclerotic heart disease of native coronary artery with unspecified angina pectoris: Secondary | ICD-10-CM | POA: Diagnosis not present

## 2018-06-19 DIAGNOSIS — E1151 Type 2 diabetes mellitus with diabetic peripheral angiopathy without gangrene: Secondary | ICD-10-CM

## 2018-06-19 MED ORDER — AMLODIPINE BESYLATE 10 MG PO TABS: 10.0000 mg | ORAL_TABLET | Freq: Every day | ORAL | 11 refills | Status: DC

## 2018-06-19 NOTE — Progress Notes (Signed)
Internal Medicine Clinic Attending  Case discussed with Dr. Blum soon after the resident saw the patient.  We reviewed the resident's history, telephone conversation and pertinent patient test results.  I agree with the assessment, diagnosis, and plan of care documented in the resident's note.   

## 2018-06-19 NOTE — Patient Instructions (Addendum)
Medication Instructions:  Increase amlodipine to 10 mg daily A new prescription has been sent to your pharmacy You can take 2 tablets of the 5 mg dose to equal 10 mg  If you need a refill on your cardiac medications before your next appointment, please call your pharmacy.   Lab work: I will arrange for a home visit to obtain labs next week: BMET, CBC, BNP  If you have labs (blood work) drawn today and your tests are completely normal, you will receive your results only by: Marland Kitchen MyChart Message (if you have MyChart) OR . A paper copy in the mail If you have any lab test that is abnormal or we need to change your treatment, we will call you to review the results.  Testing/Procedures: None  Follow-Up: You are scheduled for a phone visit with Richardson Dopp PA-C on 07/07/2018 @ 12:15 pm someone from our office will call you 10-15 mins prior to your appt to go over your medications, history and any vitals signs you are able to obtain while at home.   Any Other Special Instructions Will Be Listed Below (If Applicable).  I will arrange for a blood pressure machine to be delivered to your house. Try to check your blood pressure several times over the next few weeks. We can go over your blood pressure readings at your next visit. The nurse practitioner that comes out to your house for the home visit will be able to check your blood pressure as well.

## 2018-06-19 NOTE — Telephone Encounter (Signed)
CSW referred to assist patient with obtaining a BP cuff. CSW contacted patient to inform cuff will be delivered to home next week. Patient grateful for support and assistance. CSW available as needed. Jackie Bexley Mclester, LCSW, CCSW-MCS [phone]  

## 2018-06-19 NOTE — Telephone Encounter (Signed)
[place] Visit Initial Request  Agency Requested:    [facility] Contact:  Glory Buff, NP [address] [place],  [address] Phone #:  [phone] Fax #:  [phone]  Patient Demographic Information: Name:  [name] Age:  72 y.o.   DOB:  January 01, 1946  MRN:  [id number]   Address:   [address] [place] [address]   Phone Numbers:   Home Phone [phone]  Work Phone [phone]  Mobile [phone]     Emergency Contact Information on File:   Contact Information    Name Relation Home Work [name] Sister [phone]  [phone]   [name], [name] Daughter   [phone]      The above family members may be contacted for information on this patient (review DPR on file):  No    Patient Clinical Information:  Primary Care Provider:  Lucious Groves, DO  Primary Cardiologist:  Sherren Mocha, MD  Primary Electrophysiologist:  None   Requesting Provider:  Richardson Dopp, PA-C     Past Medical Hx: [name]  has a past medical history of Back pain, CAD (coronary artery disease), Cervical lymphadenopathy, Cervical polyp, Coronary artery disease, Depression, Diabetes mellitus without complication ([place]), History of colon polyps, Hyperlipidemia, Hypertension, Hypothyroidism, Neck mass, OSA (obstructive sleep apnea), PTSD (post-traumatic stress disorder), Renal artery stenosis ([place]) (2007), Stroke [facility]), TIA (transient ischemic attack), Tubular adenoma of rectum (7/98/92), Umbilical hernia, and Urinary tract infection.   Allergies: She has No Known Allergies.   Medications: Current Outpatient Medications on File Prior to Visit  Medication Sig  . amLODipine (NORVASC) 10 MG tablet Take 1 tablet (10 mg total) by mouth daily.  Marland Kitchen aspirin 81 MG tablet Take 1 tablet (81 mg total) by mouth daily.  Marland Kitchen atorvastatin (LIPITOR) 80 MG tablet Take 1 tablet (80 mg total) by mouth daily.  . carvedilol (COREG) 12.5 MG  tablet Take 1 tablet (12.5 mg total) by mouth 2 (two) times daily with a meal.  . clopidogrel (PLAVIX) 75 MG tablet Take 1 tablet (75 mg total) daily by mouth. Please make appt with Dr. Burt Knack before anymore refills. 2nd attempt  . EQ ALLERGY RELIEF 10 MG tablet TAKE 1 TABLET BY MOUTH ONCE DAILY  . fluticasone (FLONASE) 50 MCG/ACT nasal spray Place 1 spray into both nostrils daily.  . isosorbide mononitrate (IMDUR) 30 MG 24 hr tablet Take 1 tablet (30 mg total) by mouth daily. Please make overdue appt with Doctor before anymore refills. 2nd attempt  . levothyroxine (SYNTHROID) 88 MCG tablet Take 1 tablet (88 mcg total) by mouth daily before breakfast.  . metFORMIN (GLUCOPHAGE-XR) 500 MG 24 hr tablet TAKE TWO TABLETS BY MOUTH ONCE DAILY WITH  BREAKFAST  . nitroGLYCERIN (NITROSTAT) 0.4 MG SL tablet DISSOLVE ONE TABLET UNDER THE TONGUE EVERY 5 MINUTES AS NEEDED FOR CHEST PAIN.  DO NOT EXCEED A TOTAL OF 3 DOSES IN 15 MINUTES  . spironolactone (ALDACTONE) 25 MG tablet Take 1 tablet (25 mg total) by mouth daily.  . valsartan-hydrochlorothiazide (DIOVAN-HCT) 320-25 MG tablet Take 1 tablet by mouth daily.   No current facility-administered medications on file prior to visit.      Social Hx: She  reports that she has quit smoking. She has never used smokeless tobacco. She reports current alcohol use. She reports that she does not use drugs.    Diagnosis/Reason for Visit:   Shortness of breath, uncontrolled hypertension  Services Requested:  Vital Signs (BP, Pulse, O2,  Weight)  Physical Exam  Rhythm Strip (AliveCor Device)  Labs:  BMET, CBC, BNP  # of Visits Needed/Frequency per Week:  1   A copy of the office note will be faxed with this form.  All labs ordered for this home visit have been released.

## 2018-06-25 DIAGNOSIS — I25119 Atherosclerotic heart disease of native coronary artery with unspecified angina pectoris: Secondary | ICD-10-CM | POA: Diagnosis not present

## 2018-06-25 DIAGNOSIS — I10 Essential (primary) hypertension: Secondary | ICD-10-CM | POA: Diagnosis not present

## 2018-06-25 DIAGNOSIS — R0602 Shortness of breath: Secondary | ICD-10-CM | POA: Diagnosis not present

## 2018-06-26 ENCOUNTER — Telehealth: Payer: Self-pay | Admitting: Physician Assistant

## 2018-06-26 ENCOUNTER — Telehealth: Payer: Self-pay | Admitting: *Deleted

## 2018-06-26 LAB — BASIC METABOLIC PANEL
BUN/Creatinine Ratio: 14 (ref 12–28)
BUN: 14 mg/dL (ref 8–27)
CO2: 29 mmol/L (ref 20–29)
Calcium: 9.6 mg/dL (ref 8.7–10.3)
Chloride: 100 mmol/L (ref 96–106)
Creatinine, Ser: 0.98 mg/dL (ref 0.57–1.00)
GFR calc Af Amer: 68 mL/min/{1.73_m2} (ref >59)
GFR calc non Af Amer: 59 mL/min/{1.73_m2} — ABNORMAL LOW (ref >59)
Glucose: 190 mg/dL — ABNORMAL HIGH (ref 65–99)
Potassium: 4.1 mmol/L (ref 3.5–5.2)
Sodium: 143 mmol/L (ref 134–144)

## 2018-06-26 LAB — CBC
Hematocrit: 36 % (ref 34.0–46.6)
Hemoglobin: 11.4 g/dL (ref 11.1–15.9)
MCH: 27.7 pg (ref 26.6–33.0)
MCHC: 31.7 g/dL (ref 31.5–35.7)
MCV: 87 fL (ref 79–97)
Platelets: 255 10*3/uL (ref 150–450)
RBC: 4.12 x10E6/uL (ref 3.77–5.28)
RDW: 13.7 % (ref 11.7–15.4)
WBC: 7.6 10*3/uL (ref 3.4–10.8)

## 2018-06-26 LAB — PRO B NATRIURETIC PEPTIDE: NT-Pro BNP: 145 pg/mL (ref 0–301)

## 2018-06-26 MED ORDER — ISOSORBIDE MONONITRATE ER 30 MG PO TB24: 30.0000 mg | ORAL_TABLET | Freq: Every day | ORAL | 6 refills | Status: DC

## 2018-06-26 MED ORDER — NITROGLYCERIN 0.4 MG SL SUBL: 0.4000 mg | SUBLINGUAL_TABLET | SUBLINGUAL | 3 refills | Status: DC | PRN

## 2018-06-26 MED ORDER — CLOPIDOGREL BISULFATE 75 MG PO TABS: 75.0000 mg | ORAL_TABLET | Freq: Every day | ORAL | 6 refills | Status: DC

## 2018-06-26 NOTE — Telephone Encounter (Signed)
SPOKE WITH PT ABOUT RESULTS AND VERBALIZED UNDERSTANDING PT DID  NEED RX   IMDUR  NITRO PLAVIX ALL SENT TO [facility] [place] 30 DAY SUPPLY   PT WILL REQUEST SYNTHROID THROUGH PCP

## 2018-06-26 NOTE — Telephone Encounter (Signed)
Reviewed notes from home visit. It looks like she is out of Imdur, Plavix, NTG and Synthroid. She will need to get her Synthroid refilled with her PCP. Please check with patient about the Imdur, NTG, Plavix and send refills in to her pharmacy. Richardson Dopp, PA-C    06/26/2018 2:23 PM

## 2018-07-03 ENCOUNTER — Telehealth: Payer: Self-pay | Admitting: Dietician

## 2018-07-03 NOTE — Telephone Encounter (Signed)
Tried calling this patient. Their voicemail box is not set up. I was unable to leave a message  

## 2018-07-07 ENCOUNTER — Encounter: Payer: Self-pay | Admitting: Physician Assistant

## 2018-07-07 ENCOUNTER — Other Ambulatory Visit: Payer: Self-pay

## 2018-07-07 ENCOUNTER — Telehealth (INDEPENDENT_AMBULATORY_CARE_PROVIDER_SITE_OTHER): Payer: Medicare Other | Admitting: Physician Assistant

## 2018-07-07 ENCOUNTER — Telehealth: Payer: Self-pay | Admitting: *Deleted

## 2018-07-07 VITALS — BP 142/79 | HR 71 | Wt 205.0 lb

## 2018-07-07 DIAGNOSIS — I10 Essential (primary) hypertension: Secondary | ICD-10-CM

## 2018-07-07 DIAGNOSIS — Z7189 Other specified counseling: Secondary | ICD-10-CM

## 2018-07-07 MED ORDER — ISOSORBIDE MONONITRATE ER 60 MG PO TB24: 60.0000 mg | ORAL_TABLET | Freq: Every day | ORAL | 3 refills | Status: DC

## 2018-07-07 NOTE — Telephone Encounter (Signed)
Ms Difatta says she saw Dr. Katy Fitch in the last year. Agrees to have [place] call for a report.

## 2018-07-07 NOTE — Patient Instructions (Signed)
Medication Instructions:    START TAKING IMDUR 60 MG ONCE A DAY   If you need a refill on your cardiac medications before your next appointment, please call your pharmacy.   Lab work: NONE ORDERED  TODAY   If you have labs (blood work) drawn today and your tests are completely normal, you will receive your results only by: Marland Kitchen MyChart Message (if you have MyChart) OR . A paper copy in the mail If you have any lab test that is abnormal or we need to change your treatment, we will call you to review the results.  Testing/Procedures: NONE ORDERED  TODAY     Follow-Up: 3 MONTHS WITH SCOTT     Any Other Special Instructions Will Be Listed Below (If Applicable).

## 2018-07-07 NOTE — Progress Notes (Signed)
Virtual Visit via Telephone Note   This visit type was conducted due to national recommendations for restrictions regarding the COVID-19 Pandemic (e.g. social distancing) in an effort to limit this patient's exposure and mitigate transmission in our community.  Due to her co-morbid illnesses, this patient is at least at moderate risk for complications without adequate follow up.  This format is felt to be most appropriate for this patient at this time.  The patient did not have access to video technology/had technical difficulties with video requiring transitioning to audio format only (telephone).  All issues noted in this document were discussed and addressed.  No physical exam could be performed with this format.  Please refer to the patient's chart for her  consent to telehealth for [facility].   Date:  07/07/2018   ID:  [name], DOB April 02, 1945, MRN [id number]  Patient Location: Home Provider Location: Home  PCP:  Lucious Groves, DO  Cardiologist:  Sherren Mocha, MD   Electrophysiologist:  None   Evaluation Performed:  Follow-Up Visit  Chief Complaint:  FU on HTN  History of Present Illness:    [name] is a 73 y.o. female with coronary artery disease with known chronic total occlusion of the RCA, s/p DES to LCx, diabetes, hyperlipidemia, hypertension, prior TIA.   I saw her last month and her BP was uncontrolled.  She complained of shortness of breath.  I increased her amlodipine and arranged a home visit.  BNP was normal.  She notes that she is doing well.  She has some nasal congestion but no significant shortness of breath.  She is not had chest discomfort, syncope, significant lower extremity swelling.  The patient does not have symptoms concerning for COVID-19 infection (fever, chills, cough, or new shortness of breath).    Past Medical History:  Diagnosis Date  . Back pain    reason unknown  . CAD (coronary artery disease)    a. 07/2014: DES to OM1  b.  02/2015: CTO to RCA   . Cervical lymphadenopathy   . Cervical polyp   . Coronary artery disease    Prior stent of the lCX with chronic total occlusion of a small RCA. Last cath in 2004 showing patent stent, nonobstructive disease and total RCA. EF is 65 to 70%. She has been managed medically.   . Depression    sees a counselor at [facility] but no meds  . Diabetes mellitus without complication ([place])    takes Metformin daily  . History of colon polyps   . Hyperlipidemia    takes Crestor daily  . Hypertension    takes Amlodipine,Coreg,and Prinzide daily  . Hypothyroidism    takes Synthroid daily  . Neck mass   . OSA (obstructive sleep apnea)    PT DENIES, STATES SHE WAS TOLD SHE DIDN'T HAVE OSA, NO CPAP  . PTSD (post-traumatic stress disorder)   . Renal artery stenosis ([place]) 2007  . Stroke ([place])    TIA- LAST ONE GREATER THAN 10 YEARS PER PT  . TIA (transient ischemic attack)   . Tubular adenoma of rectum 10/24/04  . Umbilical hernia   . Urinary tract infection    Past Surgical History:  Procedure Laterality Date  . ANKLE SURGERY Left    plates and screws  . APPENDECTOMY    . CARDIAC CATHETERIZATION  2005/2006/2007/2010  . CARDIAC CATHETERIZATION N/A 07/18/2014   Procedure: Left Heart Cath and Coronary Angiography;  Surgeon: Lorretta Harp, MD; LAD patent, RCA  100% with L-R collaterals, OM1 80% stenosed proximal to previously placed stent, 30% ISR, EF 55%  . CARDIAC CATHETERIZATION N/A 02/13/2015   Procedure: Left Heart Cath and Coronary Angiography;  Surgeon: Sherren Mocha, MD;  Location: [place] [facility];  Service: Cardiovascular;  Laterality: N/A;  . CARDIAC CATHETERIZATION Right 02/13/2015   Procedure: Coronary Stent Intervention;  Surgeon: Sherren Mocha, MD;  Location: [place] [facility];  Service: Cardiovascular;  Laterality: Right;  . CARDIAC CATHETERIZATION N/A 07/10/2015   Procedure: Left Heart Cath and Coronary Angiography;  Surgeon: Leonie Man, MD;  Location:  [place] [facility];  Service: Cardiovascular;  Laterality: N/A;  . COLONOSCOPY  10/2004  . CORONARY ANGIOPLASTY     x 1  . CORONARY STENT PLACEMENT  1998   NIR 3.0 x 25 to the OM1  . CORONARY STENT PLACEMENT  07/18/2014   SYNERGY DES 2.5X12 to the OM1   . INSERTION OF MESH N/A 05/27/2013   Procedure: INSERTION OF MESH;  Surgeon: Imogene Burn. Georgette Dover, MD;  Location: [place];  Service: General;  Laterality: N/A;  . PERCUTANEOUS CORONARY STENT INTERVENTION (PCI-S)  02/13/2015   des  to proximal circumflex  . RENAL ARTERY STENT Bilateral 2007   Dr. Albertine Patricia  . UMBILICAL HERNIA REPAIR    . UMBILICAL HERNIA REPAIR N/A 05/27/2013   Procedure: HERNIA REPAIR UMBILICAL ADULT;  Surgeon: Imogene Burn. Georgette Dover, MD;  Location: [place];  Service: General;  Laterality: N/A;     Current Meds  Medication Sig  . amLODipine (NORVASC) 10 MG tablet Take 1 tablet (10 mg total) by mouth daily.  Marland Kitchen aspirin 81 MG tablet Take 1 tablet (81 mg total) by mouth daily.  Marland Kitchen atorvastatin (LIPITOR) 80 MG tablet Take 1 tablet (80 mg total) by mouth daily.  . carvedilol (COREG) 12.5 MG tablet Take 1 tablet (12.5 mg total) by mouth 2 (two) times daily with a meal.  . clopidogrel (PLAVIX) 75 MG tablet Take 1 tablet (75 mg total) by mouth daily.  Noelle Penner ALLERGY RELIEF 10 MG tablet TAKE 1 TABLET BY MOUTH ONCE DAILY  . fluticasone (FLONASE) 50 MCG/ACT nasal spray Place 1 spray into both nostrils daily.  Marland Kitchen levothyroxine (SYNTHROID) 88 MCG tablet Take 1 tablet (88 mcg total) by mouth daily before breakfast.  . metFORMIN (GLUCOPHAGE-XR) 500 MG 24 hr tablet TAKE TWO TABLETS BY MOUTH ONCE DAILY WITH  BREAKFAST  . nitroGLYCERIN (NITROSTAT) 0.4 MG SL tablet Place 1 tablet (0.4 mg total) under the tongue every 5 (five) minutes as needed for chest pain.  Marland Kitchen spironolactone (ALDACTONE) 25 MG tablet Take 1 tablet (25 mg total) by mouth daily.  . valsartan-hydrochlorothiazide (DIOVAN-HCT) 320-25 MG tablet Take 1 tablet by mouth daily.  . [DISCONTINUED]  isosorbide mononitrate (IMDUR) 30 MG 24 hr tablet Take 1 tablet (30 mg total) by mouth daily.     Allergies:   Patient has no known allergies.   Social History   Tobacco Use  . Smoking status: Former Research scientist (life sciences)  . Smokeless tobacco: Never Used  . Tobacco comment: quit smoking about 30 yrs ago  Substance Use Topics  . Alcohol use: Yes    Alcohol/week: 0.0 standard drinks    Comment: Wine on special occasions  . Drug use: No     Family Hx: The patient's family history includes Alzheimer's disease in her father; Heart attack in her maternal grandfather; Hypertension in her mother; Osteoarthritis in her mother. There is no history of Cancer, Stroke, Colon cancer, Esophageal cancer,  Stomach cancer, or Rectal cancer.  ROS:   Please see the history of present illness.     All other systems reviewed and are negative.   Prior CV studies:   The following studies were reviewed today:  Echo 07/11/2015 Mod LVH, EF 60-65, no RWMA, Gr 1 DD  Cardiac Catheterization 07/10/2015 LM ost 25 LAD prox 25 LCx prox stent into OM2 patent  RCA known to be occluded LVEDP 40    Myoview 01/31/15 Intermediate risk stress nuclear study with small, moderate intensity, reversible distal anterior and basal lateral defects consistent with ischemia in both distributions; EF 63 with normal wall motion.  Carotid US 01/31/14 Bilateral ICA 1-39  Labs/Other Tests and Data Reviewed:    EKG:  No ECG reviewed.  Recent Labs: 01/22/2018: TSH 5.530 06/25/2018: BUN 14; Creatinine, Ser 0.98; Hemoglobin 11.4; NT-Pro BNP 145; Platelets 255; Potassium 4.1; Sodium 143   Recent Lipid Panel Lab Results  Component Value Date/Time   CHOL 110 01/22/2018 10:26 AM   TRIG 69 01/22/2018 10:26 AM   HDL 38 (L) 01/22/2018 10:26 AM   CHOLHDL 2.9 01/22/2018 10:26 AM   CHOLHDL 6 08/25/2014 07:41 AM   LDLCALC 58 01/22/2018 10:26 AM   LDLDIRECT 196.8 07/11/2010 11:07 AM    Wt Readings from Last 3 Encounters:  07/07/18 205  lb (93 kg)  06/19/18 205 lb (93 kg)  01/22/18 217 lb 3.2 oz (98.5 kg)     Objective:    Vital Signs:  BP (!) 142/79   Pulse 71   Wt 205 lb (93 kg)   BMI 34.64 kg/m    VITAL SIGNS:  reviewed GEN:  no acute distress RESPIRATORY:  No labored breathing NEURO:  Alert and oriented PSYCH:  Good mood  ASSESSMENT & PLAN:    Essential hypertension Her blood pressures improved.  However, it does remain above target.  Continue current dose of amlodipine, carvedilol, Spironolactone, valsartan/HCTZ.  Increase isosorbide to 60 mg daily.  Continue to monitor blood pressure.  I can follow-up with her again in 3 months.  COVID-19 Education: The signs and symptoms of COVID-19 were discussed with the patient and how to seek care for testing (follow up with PCP or arrange E-visit).  The importance of social distancing was discussed today.  Time:   Today, I have spent 15.5 minutes with the patient with telehealth technology discussing the above problems.     Medication Adjustments/Labs and Tests Ordered: Current medicines are reviewed at length with the patient today.  Concerns regarding medicines are outlined above.   Tests Ordered: No orders of the defined types were placed in this encounter.   Medication Changes: Meds ordered this encounter  Medications  . isosorbide mononitrate (IMDUR) 60 MG 24 hr tablet    Sig: Take 1 tablet (60 mg total) by mouth daily.    Dispense:  90 tablet    Refill:  3    Dose increase    Order Specific Question:   Supervising Provider    Answer:   Lelon Perla [[id number]]    Disposition:  Follow up in 3 month(s)  Signed, Richardson Dopp, PA-C  07/07/2018 1:11 PM    [facility]

## 2018-07-07 NOTE — Telephone Encounter (Signed)
Attempting to leave pt a voicemail to call back to make appointment and pt picked up.  Pt scheduled 9-4 3 month follow up with Kathlen Mody

## 2018-07-08 ENCOUNTER — Encounter: Payer: Self-pay | Admitting: *Deleted

## 2018-07-10 ENCOUNTER — Encounter (HOSPITAL_BASED_OUTPATIENT_CLINIC_OR_DEPARTMENT_OTHER): Payer: Self-pay

## 2018-07-16 ENCOUNTER — Ambulatory Visit: Payer: Medicare Other | Admitting: Internal Medicine

## 2018-07-23 ENCOUNTER — Ambulatory Visit: Payer: Medicare Other | Admitting: Internal Medicine

## 2018-07-28 ENCOUNTER — Other Ambulatory Visit (HOSPITAL_COMMUNITY)
Admission: RE | Admit: 2018-07-28 | Discharge: 2018-07-28 | Disposition: A | Discharge status: Home / Self Care | Payer: Medicare Other | Source: Ambulatory Visit | Attending: Internal Medicine | Admitting: Internal Medicine

## 2018-07-28 DIAGNOSIS — Z1159 Encounter for screening for other viral diseases: Secondary | ICD-10-CM | POA: Insufficient documentation

## 2018-07-28 LAB — SARS CORONAVIRUS 2 (TAT 6-24 HRS): SARS Coronavirus 2: NEGATIVE

## 2018-07-31 ENCOUNTER — Encounter: Payer: Self-pay | Admitting: *Deleted

## 2018-07-31 ENCOUNTER — Ambulatory Visit (HOSPITAL_BASED_OUTPATIENT_CLINIC_OR_DEPARTMENT_OTHER): Payer: Medicare Other | Attending: Internal Medicine | Admitting: Internal Medicine

## 2018-07-31 ENCOUNTER — Other Ambulatory Visit: Payer: Self-pay

## 2018-07-31 VITALS — Ht 64.0 in | Wt 212.0 lb

## 2018-07-31 DIAGNOSIS — G4733 Obstructive sleep apnea (adult) (pediatric): Secondary | ICD-10-CM | POA: Diagnosis not present

## 2018-07-31 DIAGNOSIS — R0683 Snoring: Secondary | ICD-10-CM

## 2018-08-03 ENCOUNTER — Other Ambulatory Visit (HOSPITAL_BASED_OUTPATIENT_CLINIC_OR_DEPARTMENT_OTHER): Payer: Self-pay

## 2018-08-03 ENCOUNTER — Other Ambulatory Visit: Payer: Self-pay

## 2018-08-03 DIAGNOSIS — G4733 Obstructive sleep apnea (adult) (pediatric): Secondary | ICD-10-CM

## 2018-08-10 DIAGNOSIS — E119 Type 2 diabetes mellitus without complications: Secondary | ICD-10-CM | POA: Diagnosis not present

## 2018-08-10 DIAGNOSIS — H04123 Dry eye syndrome of bilateral lacrimal glands: Secondary | ICD-10-CM | POA: Diagnosis not present

## 2018-08-10 DIAGNOSIS — H0102A Squamous blepharitis right eye, upper and lower eyelids: Secondary | ICD-10-CM | POA: Diagnosis not present

## 2018-08-10 DIAGNOSIS — H2513 Age-related nuclear cataract, bilateral: Secondary | ICD-10-CM | POA: Diagnosis not present

## 2018-08-10 DIAGNOSIS — H0102B Squamous blepharitis left eye, upper and lower eyelids: Secondary | ICD-10-CM | POA: Diagnosis not present

## 2018-08-10 LAB — HM DIABETES EYE EXAM

## 2018-08-12 DIAGNOSIS — G4733 Obstructive sleep apnea (adult) (pediatric): Secondary | ICD-10-CM

## 2018-08-12 NOTE — Procedures (Signed)
   Patient Name: [name], [name] Date: 07/31/2018 Gender: Female D.O.B: Jul 04, 1945 Age (years): 43 Referring Provider: Lemar Lofty DO Height (inches): 64 Interpreting Physician: Baird Lyons MD, ABSM Weight (lbs): 212 RPSGT: Earney Hamburg BMI: 36 MRN: [id number] Neck Size: 17.00  CLINICAL INFORMATION Sleep Study Type: NPSG Indication for sleep study: OSA Epworth Sleepiness Score: 9  SLEEP STUDY TECHNIQUE As per the AASM Manual for the Scoring of Sleep and Associated Events v2.3 (April 2016) with a hypopnea requiring 4% desaturations.  The channels recorded and monitored were frontal, central and occipital EEG, electrooculogram (EOG), submentalis EMG (chin), nasal and oral airflow, thoracic and abdominal wall motion, anterior tibialis EMG, snore microphone, electrocardiogram, and pulse oximetry.  MEDICATIONS Medications self-administered by patient taken the night of the study : none reported  SLEEP ARCHITECTURE The study was initiated at 9:43:45 PM and ended at 4:41:46 AM.  Sleep onset time was 35.4 minutes and the sleep efficiency was 65.0%%. The total sleep time was 271.5 minutes.  Stage REM latency was 40.0 minutes.  The patient spent 1.7%% of the night in stage N1 sleep, 84.7%% in stage N2 sleep, 0.0%% in stage N3 and 13.6% in REM.  Alpha intrusion was absent.  Supine sleep was 100.00%.  RESPIRATORY PARAMETERS The overall apnea/hypopnea index (AHI) was 2.0 per hour. There were 4 total apneas, including 4 obstructive, 0 central and 0 mixed apneas. There were 5 hypopneas and 7 RERAs.  The AHI during Stage REM sleep was 11.4 per hour.  AHI while supine was 2.0 per hour.  The mean oxygen saturation was 95.1%. The minimum SpO2 during sleep was 90.0%.  moderate snoring was noted during this study.  CARDIAC DATA The 2 lead EKG demonstrated sinus rhythm. The mean heart rate was 53.6 beats per minute. Other EKG findings include: None.  LEG MOVEMENT DATA  The total PLMS were 0 with a resulting PLMS index of 0.0. Associated arousal with leg movement index was 0.0 .  IMPRESSIONS - No significant obstructive sleep apnea occurred during this study (AHI = 2.0/h). - No significant central sleep apnea occurred during this study (CAI = 0.0/h). - The patient had minimal or no oxygen desaturation during the study (Min O2 = 90.0%) - The patient snored with moderate snoring volume. - No cardiac abnormalities were noted during this study. - Clinically significant periodic limb movements did not occur during sleep. No significant associated arousals. - The patient woke at 0300 AM for bathroom, unable to return to sleep.  DIAGNOSIS - Primary snoring  RECOMMENDATIONS - Manage as clinically indicated for symptoms and snoring. . - Sleep hygiene should be reviewed to assess factors that may improve sleep quality. - Weight management and regular exercise should be initiated or continued if appropriate.  [Electronically signed] 08/12/2018 02:21 PM  Baird Lyons MD, [place], American Board of Sleep Medicine   NPI: [id number]                          [place], [place] [facility]  ELECTRONICALLY SIGNED ON:  08/12/2018, 2:18 PM [place] PH: [phone]   FX: [phone] [place]

## 2018-08-13 ENCOUNTER — Ambulatory Visit (INDEPENDENT_AMBULATORY_CARE_PROVIDER_SITE_OTHER): Payer: Medicare Other | Admitting: Internal Medicine

## 2018-08-13 ENCOUNTER — Encounter: Payer: Self-pay | Admitting: Internal Medicine

## 2018-08-13 ENCOUNTER — Other Ambulatory Visit: Payer: Self-pay

## 2018-08-13 VITALS — BP 115/66 | HR 72 | Temp 98.6°F | Ht 64.0 in | Wt 223.4 lb

## 2018-08-13 DIAGNOSIS — K029 Dental caries, unspecified: Secondary | ICD-10-CM | POA: Diagnosis not present

## 2018-08-13 DIAGNOSIS — E118 Type 2 diabetes mellitus with unspecified complications: Secondary | ICD-10-CM | POA: Diagnosis not present

## 2018-08-13 DIAGNOSIS — E039 Hypothyroidism, unspecified: Secondary | ICD-10-CM

## 2018-08-13 DIAGNOSIS — E1151 Type 2 diabetes mellitus with diabetic peripheral angiopathy without gangrene: Secondary | ICD-10-CM

## 2018-08-13 DIAGNOSIS — I10 Essential (primary) hypertension: Secondary | ICD-10-CM

## 2018-08-13 DIAGNOSIS — R0683 Snoring: Secondary | ICD-10-CM

## 2018-08-13 DIAGNOSIS — Z79890 Hormone replacement therapy (postmenopausal): Secondary | ICD-10-CM

## 2018-08-13 DIAGNOSIS — Z79899 Other long term (current) drug therapy: Secondary | ICD-10-CM

## 2018-08-13 DIAGNOSIS — E038 Other specified hypothyroidism: Secondary | ICD-10-CM

## 2018-08-13 DIAGNOSIS — Z7984 Long term (current) use of oral hypoglycemic drugs: Secondary | ICD-10-CM

## 2018-08-13 DIAGNOSIS — F5101 Primary insomnia: Secondary | ICD-10-CM

## 2018-08-13 DIAGNOSIS — F329 Major depressive disorder, single episode, unspecified: Secondary | ICD-10-CM

## 2018-08-13 DIAGNOSIS — Z6838 Body mass index (BMI) 38.0-38.9, adult: Secondary | ICD-10-CM

## 2018-08-13 LAB — POCT GLYCOSYLATED HEMOGLOBIN (HGB A1C): Hemoglobin A1C: 8.4 % — AB (ref 4.0–5.6)

## 2018-08-13 LAB — GLUCOSE, CAPILLARY: Glucose-Capillary: 133 mg/dL — ABNORMAL HIGH (ref 70–99)

## 2018-08-13 NOTE — Progress Notes (Signed)
#  [phone]

## 2018-08-14 LAB — TSH: TSH: 32.07 u[IU]/mL — ABNORMAL HIGH (ref 0.450–4.500)

## 2018-08-17 ENCOUNTER — Encounter: Payer: Self-pay | Admitting: *Deleted

## 2018-08-17 ENCOUNTER — Encounter: Payer: Self-pay | Admitting: Internal Medicine

## 2018-08-17 DIAGNOSIS — K029 Dental caries, unspecified: Secondary | ICD-10-CM | POA: Insufficient documentation

## 2018-08-17 MED ORDER — LEVOTHYROXINE SODIUM 100 MCG PO TABS: 100.0000 ug | ORAL_TABLET | Freq: Every day | ORAL | 3 refills | Status: DC

## 2018-08-17 NOTE — Assessment & Plan Note (Signed)
HPI: asks about seeing dentist.  A: Dental caries  P:  on exam multiple rotten teeth, mild inflamation of gumline.  Will place referral for dentristy also showed her the [place] dental hotline number on her card that she could call for providers.

## 2018-08-17 NOTE — Assessment & Plan Note (Signed)
Weight gain, not eating as healthy due to shelter in place orders. discusssed diet, also checked TSH>> high> adjusted sythroid.

## 2018-08-17 NOTE — Assessment & Plan Note (Signed)
HPI: History of moderate sleep apnea, however I recently had her complete a repeat sleep study which showed only primary insomnia without evidence of sleep apnea.  A: Primary Snorning  P: -Monitor.

## 2018-08-17 NOTE — Assessment & Plan Note (Addendum)
HPI: Historically difficult to control but well controlled. Today, taking medications as directed.  Has had a couple of episodes of not feeling well, no clear ortho-stasis.  Assessment: Essential hypertension controlled  Plan -Valsartan-HCTZ 320-25 mg 1 tablet daily -Spironolactone 25 mg daily -Imdur 60 mg daily Amlodipine 10 mg daily -Carvedilol 12.5 mg twice daily  May need to back off medications if blood pressure is low or has orthostasis.

## 2018-08-17 NOTE — Assessment & Plan Note (Signed)
HPI: admits to less healthy eating recently. Taking metformin as prescribed.  A: Type 2 DM, uncontrolled with hyperglycemia  P: Continue metformin 500mg  daily. Discussed diet changes. Follow up in 3 months.

## 2018-08-17 NOTE — Assessment & Plan Note (Signed)
HPI: has had some weight gain, also feeling more depressed, attributes both to COVID-19.  Reports taking medications but it sounds like she is taking all medications at once.  A: Hypothryodism   P: discussed taking synthroid on empty stomach separate from other medications. TSH has returned elevated at 32 will have her increase levothyroxine from 66mcg to 166mcg daily.

## 2018-08-17 NOTE — Progress Notes (Signed)
  Subjective:  HPI: TeresaTeresa [name] is a 73 y.o. female who presents for f/u DM, HTN  Please see Assessment and Plan below for the status of her chronic medical problems.  Review of Systems: Review of Systems  Constitutional: Positive for malaise/fatigue. Negative for chills, fever and weight loss.  Respiratory: Negative for cough and shortness of breath.   Cardiovascular: Negative for chest pain.  Gastrointestinal: Negative for abdominal pain.  Genitourinary: Negative for dysuria.  Neurological: Negative for dizziness.  Psychiatric/Behavioral: Positive for depression. Negative for substance abuse and suicidal ideas. The patient is not nervous/anxious.     Objective:  Physical Exam: Vitals:   08/13/18 0915  BP: 115/66  Pulse: 72  Temp: 98.6 F (37 C)  TempSrc: Oral  SpO2: 100%  Weight: 223 lb 6.4 oz (101.3 kg)  Height: 5\' 4"  (1.626 m)   Body mass index is 38.35 kg/m. Physical Exam Vitals signs and nursing note reviewed.  Cardiovascular:     Rate and Rhythm: Normal rate and regular rhythm.     Pulses: Normal pulses.  Pulmonary:     Effort: Pulmonary effort is normal.     Breath sounds: Normal breath sounds.  Abdominal:     General: Abdomen is flat.     Palpations: Abdomen is soft.  Musculoskeletal:     Right lower leg: No edema.     Left lower leg: No edema.  Psychiatric:        Mood and Affect: Mood normal.    Assessment & Plan:  See Encounters Tab for problem based charting.  Medications Ordered Meds ordered this encounter  Medications  . levothyroxine (SYNTHROID) 100 MCG tablet    Sig: Take 1 tablet (100 mcg total) by mouth daily before breakfast.    Dispense:  90 tablet    Refill:  3    Please discontinue any other levothyroxine refills on file.   Other Orders Orders Placed This Encounter  Procedures  . Glucose, capillary  . TSH  . Ambulatory referral to Dentistry    Referral Priority:   Routine    Referral Type:   Consultation    Referral  Reason:   Specialty Services Required    Requested Specialty:   Dental General Practice    Number of Visits Requested:   1  . POCT HgB A1C (CPT [phone])   Follow Up: Return in about 3 months (around 11/13/2018).

## 2018-10-05 ENCOUNTER — Telehealth (INDEPENDENT_AMBULATORY_CARE_PROVIDER_SITE_OTHER): Payer: Medicare Other | Admitting: Physician Assistant

## 2018-10-05 ENCOUNTER — Encounter: Payer: Self-pay | Admitting: Physician Assistant

## 2018-10-05 VITALS — BP 110/63 | HR 89 | Ht 64.0 in | Wt 212.0 lb

## 2018-10-05 DIAGNOSIS — I25119 Atherosclerotic heart disease of native coronary artery with unspecified angina pectoris: Secondary | ICD-10-CM

## 2018-10-05 NOTE — Progress Notes (Signed)
Virtual Visit via Telephone Note   This visit type was conducted due to national recommendations for restrictions regarding the COVID-19 Pandemic (e.g. social distancing) in an effort to limit this patient's exposure and mitigate transmission in our community.  Due to her co-morbid illnesses, this patient is at least at moderate risk for complications without adequate follow up.  This format is felt to be most appropriate for this patient at this time.  The patient did not have access to video technology/had technical difficulties with video requiring transitioning to audio format only (telephone).  All issues noted in this document were discussed and addressed.  No physical exam could be performed with this format.  Please refer to the patient's chart for her  consent to telehealth for [facility].   Date:  10/05/2018   ID:  [name], DOB 08/09/1945, MRN [id number]  Patient Location: Home Provider Location: Home  PCP:  Lucious Groves, DO  Cardiologist:  Sherren Mocha, MD   Electrophysiologist:  None   Evaluation Performed:  Follow-Up Visit  Chief Complaint: CAD, hypertension  History of Present Illness:    [name] is a 73 y.o. female with:  Coronary artery disease  Known chronic occlusion of the RCA  S/p DES to the LCx in 2016  Cath 6/17: LCx stent patent; CTO of RCA noted  Diabetes mellitus  Hyperlipidemia  Hypertension  Prior TIA  [name] was last seen by me via telemedicine 07/07/2018.  She had previously had her amlodipine adjusted for uncontrolled blood pressure.  She complained of shortness of breath.  BNP was normal.  When I last saw her in June, her blood pressure was improved.  I adjusted her isosorbide.  Today, she notes she is doing well.  Her blood pressure readings are optimal at home.  She has not had chest pain, shortness of breath, leg swelling or syncope.  She does feel fatigued at times.  The patient does not have symptoms concerning for  COVID-19 infection (fever, chills, cough, or new shortness of breath).    Past Medical History:  Diagnosis Date  . Back pain    reason unknown  . CAD (coronary artery disease)    a. 07/2014: DES to OM1  b. 02/2015: CTO to RCA   . Cervical lymphadenopathy   . Cervical polyp   . Coronary artery disease    Prior stent of the lCX with chronic total occlusion of a small RCA. Last cath in 2004 showing patent stent, nonobstructive disease and total RCA. EF is 65 to 70%. She has been managed medically.   . Depression    sees a counselor at [facility] but no meds  . Diabetes mellitus without complication ([place])    takes Metformin daily  . History of colon polyps   . Hyperlipidemia    takes Crestor daily  . Hypertension    takes Amlodipine,Coreg,and Prinzide daily  . Hypothyroidism    takes Synthroid daily  . Mild obstructive sleep apnea 10/16/2012   03/12/2006: Moderate Obstructive sleep apnea with a Respiratory Disturbance Index of 29 events per hour.  05/26/13 Mild obstructive sleep apnea/hypopnea syndrome, AHI 7.5 per hour with events in all positions, especially supine. REM AHI 25.9 per hour. Moderately loud snoring with oxygen desaturation to a nadir of 82% and mean oxygen saturation through the study of 92.3% on room air.  07/31/18: Sleep s  . Neck mass   . OSA (obstructive sleep apnea)    PT DENIES, STATES SHE WAS TOLD  SHE DIDN'T HAVE OSA, NO CPAP  . PTSD (post-traumatic stress disorder)   . Renal artery stenosis ([place]) 2007  . Stroke ([place])    TIA- LAST ONE GREATER THAN 10 YEARS PER PT  . TIA (transient ischemic attack)   . Tubular adenoma of rectum 10/24/04  . Umbilical hernia   . Urinary tract infection    Past Surgical History:  Procedure Laterality Date  . ANKLE SURGERY Left    plates and screws  . APPENDECTOMY    . CARDIAC CATHETERIZATION  2005/2006/2007/2010  . CARDIAC CATHETERIZATION N/A 07/18/2014   Procedure: Left Heart Cath and Coronary Angiography;  Surgeon: Lorretta Harp, MD; LAD patent, RCA 100% with L-R collaterals, OM1 80% stenosed proximal to previously placed stent, 30% ISR, EF 55%  . CARDIAC CATHETERIZATION N/A 02/13/2015   Procedure: Left Heart Cath and Coronary Angiography;  Surgeon: Sherren Mocha, MD;  Location: [place] [facility];  Service: Cardiovascular;  Laterality: N/A;  . CARDIAC CATHETERIZATION Right 02/13/2015   Procedure: Coronary Stent Intervention;  Surgeon: Sherren Mocha, MD;  Location: [place] [facility];  Service: Cardiovascular;  Laterality: Right;  . CARDIAC CATHETERIZATION N/A 07/10/2015   Procedure: Left Heart Cath and Coronary Angiography;  Surgeon: Leonie Man, MD;  Location: [place] [facility];  Service: Cardiovascular;  Laterality: N/A;  . COLONOSCOPY  10/2004  . CORONARY ANGIOPLASTY     x 1  . CORONARY STENT PLACEMENT  1998   NIR 3.0 x 25 to the OM1  . CORONARY STENT PLACEMENT  07/18/2014   SYNERGY DES 2.5X12 to the OM1   . INSERTION OF MESH N/A 05/27/2013   Procedure: INSERTION OF MESH;  Surgeon: Imogene Burn. Georgette Dover, MD;  Location: [place];  Service: General;  Laterality: N/A;  . PERCUTANEOUS CORONARY STENT INTERVENTION (PCI-S)  02/13/2015   des  to proximal circumflex  . RENAL ARTERY STENT Bilateral 2007   Dr. Albertine Patricia  . UMBILICAL HERNIA REPAIR    . UMBILICAL HERNIA REPAIR N/A 05/27/2013   Procedure: HERNIA REPAIR UMBILICAL ADULT;  Surgeon: Imogene Burn. Georgette Dover, MD;  Location: [place];  Service: General;  Laterality: N/A;     Current Meds  Medication Sig  . amLODipine (NORVASC) 10 MG tablet Take 1 tablet (10 mg total) by mouth daily.  Marland Kitchen aspirin 81 MG tablet Take 1 tablet (81 mg total) by mouth daily.  Marland Kitchen atorvastatin (LIPITOR) 80 MG tablet Take 1 tablet (80 mg total) by mouth daily.  . carvedilol (COREG) 12.5 MG tablet Take 1 tablet (12.5 mg total) by mouth 2 (two) times daily with a meal.  . clopidogrel (PLAVIX) 75 MG tablet Take 1 tablet (75 mg total) by mouth daily.  Noelle Penner ALLERGY RELIEF 10 MG tablet TAKE 1 TABLET BY MOUTH  ONCE DAILY  . fluticasone (FLONASE) 50 MCG/ACT nasal spray Place 1 spray into both nostrils daily.  . isosorbide mononitrate (IMDUR) 60 MG 24 hr tablet Take 1 tablet (60 mg total) by mouth daily.  Marland Kitchen levothyroxine (SYNTHROID) 100 MCG tablet Take 1 tablet (100 mcg total) by mouth daily before breakfast.  . metFORMIN (GLUCOPHAGE-XR) 500 MG 24 hr tablet TAKE TWO TABLETS BY MOUTH ONCE DAILY WITH  BREAKFAST  . nitroGLYCERIN (NITROSTAT) 0.4 MG SL tablet Place 1 tablet (0.4 mg total) under the tongue every 5 (five) minutes as needed for chest pain.  Marland Kitchen spironolactone (ALDACTONE) 25 MG tablet Take 1 tablet (25 mg total) by mouth daily.  . valsartan-hydrochlorothiazide (DIOVAN-HCT) 320-25 MG tablet Take 1 tablet  by mouth daily.     Allergies:   Patient has no known allergies.   Social History   Tobacco Use  . Smoking status: Former Research scientist (life sciences)  . Smokeless tobacco: Never Used  . Tobacco comment: quit smoking about 30 yrs ago  Substance Use Topics  . Alcohol use: Yes    Alcohol/week: 0.0 standard drinks    Comment: Wine on special occasions  . Drug use: No     Family Hx: The patient's family history includes Alzheimer's disease in her father; Heart attack in her maternal grandfather; Hypertension in her mother; Osteoarthritis in her mother. There is no history of Cancer, Stroke, Colon cancer, Esophageal cancer, Stomach cancer, or Rectal cancer.  ROS:   Please see the history of present illness.    All other systems reviewed and are negative.   Prior CV studies:   The following studies were reviewed today:  Echo 07/11/2015 Mod LVH, EF 60-65, no RWMA, Gr 1 DD   Cardiac Catheterization 07/10/2015 LM ost 25 LAD prox 25 LCx prox stent into OM2 patent  RCA known to be occluded LVEDP 40      Myoview 01/31/15 Intermediate risk stress nuclear study with small, moderate intensity, reversible distal anterior and basal lateral defects consistent with ischemia in both distributions; EF 63 with normal  wall motion.   Carotid US 01/31/14 Bilateral ICA 1-39   Labs/Other Tests and Data Reviewed:    EKG:  No ECG reviewed.  Recent Labs: 06/25/2018: BUN 14; Creatinine, Ser 0.98; Hemoglobin 11.4; NT-Pro BNP 145; Platelets 255; Potassium 4.1; Sodium 143 08/13/2018: TSH 32.070   Recent Lipid Panel Lab Results  Component Value Date/Time   CHOL 110 01/22/2018 10:26 AM   TRIG 69 01/22/2018 10:26 AM   HDL 38 (L) 01/22/2018 10:26 AM   CHOLHDL 2.9 01/22/2018 10:26 AM   CHOLHDL 6 08/25/2014 07:41 AM   LDLCALC 58 01/22/2018 10:26 AM   LDLDIRECT 196.8 07/11/2010 11:07 AM    Wt Readings from Last 3 Encounters:  10/05/18 212 lb (96.2 kg)  08/13/18 223 lb 6.4 oz (101.3 kg)  07/31/18 212 lb (96.2 kg)     Objective:    Vital Signs:  BP 110/63   Pulse 89   Ht 5\' 4"  (1.626 m)   Wt 212 lb (96.2 kg)   BMI 36.39 kg/m    VITAL SIGNS:  reviewed GEN:  no acute distress RESPIRATORY:  No labored breathing NEURO:  Alert and oriented PSYCH:  Normal mood  ASSESSMENT & PLAN:    1. Coronary artery disease involving native coronary artery of native heart with angina pectoris ([place]) History of stenting to the LCx in 2016.  Cardiac catheterization 2017 demonstrated patent LCx stent.  She has known chronic occlusion of the RCA.  She is doing well without anginal symptoms.  Continue current medical therapy which includes aspirin, atorvastatin, carvedilol, clopidogrel, isosorbide, amlodipine.  Follow-up in 6 months.  2. Essential hypertension The patient's blood pressure is controlled on her current regimen.  Continue current therapy.   3. Educated About Covid-19 Virus Infection The signs and symptoms of COVID-19 were discussed with the patient and how to seek care for testing (follow up with PCP or arrange E-visit).  The importance of social distancing was discussed today.  Time:   Today, I have spent 13 minutes with the patient with telehealth technology discussing the above problems.      Medication Adjustments/Labs and Tests Ordered: Current medicines are reviewed at length with the patient today.  Concerns  regarding medicines are outlined above.   Tests Ordered: No orders of the defined types were placed in this encounter.   Medication Changes: No orders of the defined types were placed in this encounter.   Follow Up:  In Person in 6 month(s)  Signed, Richardson Dopp, PA-C  10/05/2018 5:47 PM    [place]

## 2018-10-09 ENCOUNTER — Telehealth: Payer: Medicare Other | Admitting: Physician Assistant

## 2018-11-23 ENCOUNTER — Other Ambulatory Visit: Payer: Self-pay | Admitting: Internal Medicine

## 2018-11-23 DIAGNOSIS — E1151 Type 2 diabetes mellitus with diabetic peripheral angiopathy without gangrene: Secondary | ICD-10-CM

## 2018-11-23 NOTE — Telephone Encounter (Signed)
Needs refill on metFORMIN (GLUCOPHAGE-XR) 500 MG 24 hr tablet  ;pt contact [place], [place]

## 2018-11-24 MED ORDER — METFORMIN HCL ER 500 MG PO TB24: ORAL_TABLET | ORAL | 1 refills | Status: DC

## 2018-11-24 NOTE — Telephone Encounter (Signed)
Pt is calling regarding medicine refills [phone]

## 2019-01-21 ENCOUNTER — Encounter: Payer: Self-pay | Admitting: Internal Medicine

## 2019-01-21 ENCOUNTER — Ambulatory Visit (INDEPENDENT_AMBULATORY_CARE_PROVIDER_SITE_OTHER): Payer: Medicare Other | Admitting: Internal Medicine

## 2019-01-21 ENCOUNTER — Other Ambulatory Visit: Payer: Self-pay

## 2019-01-21 VITALS — BP 109/60 | HR 68 | Temp 98.3°F | Ht 64.0 in | Wt 217.9 lb

## 2019-01-21 DIAGNOSIS — R5381 Other malaise: Secondary | ICD-10-CM

## 2019-01-21 DIAGNOSIS — E039 Hypothyroidism, unspecified: Secondary | ICD-10-CM

## 2019-01-21 DIAGNOSIS — I10 Essential (primary) hypertension: Secondary | ICD-10-CM | POA: Diagnosis not present

## 2019-01-21 DIAGNOSIS — E1151 Type 2 diabetes mellitus with diabetic peripheral angiopathy without gangrene: Secondary | ICD-10-CM

## 2019-01-21 DIAGNOSIS — R5383 Other fatigue: Secondary | ICD-10-CM

## 2019-01-21 DIAGNOSIS — R5382 Chronic fatigue, unspecified: Secondary | ICD-10-CM

## 2019-01-21 DIAGNOSIS — I25119 Atherosclerotic heart disease of native coronary artery with unspecified angina pectoris: Secondary | ICD-10-CM | POA: Diagnosis not present

## 2019-01-21 DIAGNOSIS — Z7984 Long term (current) use of oral hypoglycemic drugs: Secondary | ICD-10-CM

## 2019-01-21 DIAGNOSIS — Z86010 Personal history of colonic polyps: Secondary | ICD-10-CM

## 2019-01-21 DIAGNOSIS — Z8719 Personal history of other diseases of the digestive system: Secondary | ICD-10-CM

## 2019-01-21 DIAGNOSIS — F329 Major depressive disorder, single episode, unspecified: Secondary | ICD-10-CM

## 2019-01-21 DIAGNOSIS — R1033 Periumbilical pain: Secondary | ICD-10-CM

## 2019-01-21 DIAGNOSIS — Z79899 Other long term (current) drug therapy: Secondary | ICD-10-CM

## 2019-01-21 DIAGNOSIS — E118 Type 2 diabetes mellitus with unspecified complications: Secondary | ICD-10-CM | POA: Diagnosis not present

## 2019-01-21 DIAGNOSIS — E785 Hyperlipidemia, unspecified: Secondary | ICD-10-CM

## 2019-01-21 DIAGNOSIS — Z79890 Hormone replacement therapy (postmenopausal): Secondary | ICD-10-CM

## 2019-01-21 LAB — POCT GLYCOSYLATED HEMOGLOBIN (HGB A1C): Hemoglobin A1C: 7.7 % — AB (ref 4.0–5.6)

## 2019-01-21 LAB — GLUCOSE, CAPILLARY: Glucose-Capillary: 174 mg/dL — ABNORMAL HIGH (ref 70–99)

## 2019-01-21 MED ORDER — AMLODIPINE BESYLATE 5 MG PO TABS: 5.0000 mg | ORAL_TABLET | Freq: Every day | ORAL | 3 refills | Status: DC

## 2019-01-21 NOTE — Patient Instructions (Addendum)
I am decreasing your amlodipine back to just 5mg  a day,  If your blood pressure goes back up you may increase that back to the 10mg  dose you were previously taking.  For the COVID 19 vaccine you would be eligible in group 1b.

## 2019-01-22 LAB — TSH: TSH: 1.42 u[IU]/mL (ref 0.450–4.500)

## 2019-01-22 NOTE — Assessment & Plan Note (Signed)
Taking synthroid 173mcg daily as directed (has seperated from other medications as I instructed.  Repeat TSH today is 1.4>> appropriate.

## 2019-01-22 NOTE — Assessment & Plan Note (Signed)
HPI: Has been following closely with cardiology.  She denies any chest pain no exertional chest pain or shortness of breath.  Is feeling more fatigued lately.  Assessment coronary disease with angina  Plan Continue Imdur, I have decreased her amlodipine will make sure we monitor to see if there is any return of angina.

## 2019-01-22 NOTE — Progress Notes (Signed)
  Subjective:  HPI: [name] is a 73 y.o. female who presents for f/u hypothyroidism, DM, HTN  Please see Assessment and Plan below for the status of her chronic medical problems.  Review of Systems: Review of Systems  Constitutional: Positive for malaise/fatigue. Negative for chills and fever.  Eyes: Negative for blurred vision.  Respiratory: Negative for cough and shortness of breath.   Cardiovascular: Negative for chest pain and leg swelling.  Gastrointestinal: Negative for abdominal pain, blood in stool, constipation, diarrhea and melena.  Genitourinary: Negative for dysuria.  Musculoskeletal: Negative for myalgias.  Skin: Negative for rash.  Psychiatric/Behavioral: Positive for depression.    Objective:  Physical Exam: Vitals:   01/21/19 1043  BP: 109/60  Pulse: 68  Temp: 98.3 F (36.8 C)  TempSrc: Oral  SpO2: 100%  Weight: 217 lb 14.4 oz (98.8 kg)  Height: 5\' 4"  (1.626 m)   Body mass index is 37.4 kg/m. Physical Exam Vitals and nursing note reviewed.  Constitutional:      Appearance: Normal appearance.  HENT:     Mouth/Throat:     Comments: edentulous Eyes:     Comments: Mildly pale conjuctiva  Cardiovascular:     Rate and Rhythm: Normal rate and regular rhythm.  Musculoskeletal:     Right lower leg: No edema.     Left lower leg: No edema.  Neurological:     Mental Status: She is alert. Mental status is at baseline.  Psychiatric:        Mood and Affect: Mood normal.        Behavior: Behavior normal.        Thought Content: Thought content normal.    Assessment & Plan:  See Encounters Tab for problem based charting.  Medications Ordered Meds ordered this encounter  Medications  . amLODipine (NORVASC) 5 MG tablet    Sig: Take 1 tablet (5 mg total) by mouth daily.    Dispense:  90 tablet    Refill:  3    Dose decrease   Other Orders Orders Placed This Encounter  Procedures  . TSH  . Glucose, capillary  . Ambulatory referral to General  Surgery    Referral Priority:   Routine    Referral Type:   Surgical    Referral Reason:   Specialty Services Required    Requested Specialty:   General Surgery    Number of Visits Requested:   1  . Ambulatory referral to Gastroenterology    Referral Priority:   Routine    Referral Type:   Consultation    Referral Reason:   Specialty Services Required    Number of Visits Requested:   1  . POC Hbg A1C   Follow Up: Return in about 3 months (around 04/21/2019), or if symptoms worsen or fail to improve.

## 2019-01-22 NOTE — Assessment & Plan Note (Signed)
HPI: She reports to me today increase in fatigue, weight gain.  At our last visit she was noted to have an elevated TSH of 32 we adjusted her Synthroid medication she reports she has been taking the 100 mcg dose of Synthroid since that time but has not noticed much improvement in her fatigue if anything it has worsened.  She does endorse some mild depression she attributes this to the stay at home orders due to the COVID-19 pandemic.  She has not noticed any melena or other blood loss.  Assessment chronic fatigue  Plan I have repeated TSH this being the most likely cause of her fatigue however it has returned normal I do think we have the correct Synthroid dose for her hypothyroidism.  The next most likely cause is depression.  However I will go ahead and repeat a CBC and a CMP to evaluate for any other causes.  Also on the differential is occult hypotension due to her multiple antihypertensives.

## 2019-01-22 NOTE — Assessment & Plan Note (Signed)
HPI: Historically difficult to control but well controlled.  Having some increased fatigue possible mild orthostasis symptoms.  Last 3 visits have had "softer" blood pressures. 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  Assessment: Essential hypertension controlled  Plan -Valsartan-HCTZ 320-25 mg 1 tablet daily -Spironolactone 25 mg daily -Imdur 60 mg daily -Carvedilol 12.5 mg twice daily Last change that was made was increase of amlodipine from 5 to 10 mg about 5 months ago I would not think such a change would explain such a dramatic improvement on blood pressure from the [id number] to [id number] systolics down to about [id number] systolic.  I will go ahead and decrease this back down to 5 mg patient will monitor her blood pressure and let me know she has an upcoming visit with cardiology as well.  May need to back off medications if blood pressure is low or has orthostasis.

## 2019-01-22 NOTE — Assessment & Plan Note (Signed)
On atorvastatin 80mg  daily, check lipid panel

## 2019-01-22 NOTE — Assessment & Plan Note (Signed)
History of rectal adenocarcinoma in 2006 last colonoscopy was 2015 and there is a plan for repeat colonoscopy around 2022 2021.  She reports she has not heard from Dr. Celesta Aver office but is very interested in having her repeat colonoscopy on time.  I will go ahead and place a referral back to [name].

## 2019-01-22 NOTE — Addendum Note (Signed)
Addended by: Truddie Crumble on: 01/22/2019 11:52 AM   Modules accepted: Orders

## 2019-01-22 NOTE — Assessment & Plan Note (Signed)
HPI: She had umbilical hernia repair surgery 5 years ago with Dr. Georgette Dover.  She has noticed some recent weight gain and she feels that she has a possible return of her hernia due to some mild pain near her umbilicus.  On my exam I am not able to definitively palpate a hernia.  Assessment history of umbilical hernia repair, pain at site  Plan I will refer her back over to general surgery Dr. Georgette Dover to reevaluate.

## 2019-01-22 NOTE — Assessment & Plan Note (Signed)
HPI:  Taking metformin as prescribed.  Denies polyuria or polydipsia.  A: Type 2 DM, uncontrolled with hyperglycemia  P: Continue metformin 500mg  daily. I discussed the possible benefits of addition of a GLP-1 agonist she seems somewhat interested but would like to still have little more time to see if she can better control sugars with  weight loss. Discussed diet changes. Follow up in 3 months.

## 2019-01-25 ENCOUNTER — Other Ambulatory Visit (INDEPENDENT_AMBULATORY_CARE_PROVIDER_SITE_OTHER): Payer: Medicare Other

## 2019-01-25 ENCOUNTER — Other Ambulatory Visit: Payer: Self-pay

## 2019-01-25 DIAGNOSIS — R5383 Other fatigue: Secondary | ICD-10-CM

## 2019-01-25 DIAGNOSIS — E1151 Type 2 diabetes mellitus with diabetic peripheral angiopathy without gangrene: Secondary | ICD-10-CM | POA: Diagnosis not present

## 2019-01-26 LAB — CBC WITH DIFFERENTIAL/PLATELET
Basophils Absolute: 0 10*3/uL (ref 0.0–0.2)
Basos: 1 %
EOS (ABSOLUTE): 0.2 10*3/uL (ref 0.0–0.4)
Eos: 2 %
Hematocrit: 33.7 % — ABNORMAL LOW (ref 34.0–46.6)
Hemoglobin: 10.9 g/dL — ABNORMAL LOW (ref 11.1–15.9)
Immature Grans (Abs): 0 10*3/uL (ref 0.0–0.1)
Immature Granulocytes: 0 %
Lymphocytes Absolute: 2.6 10*3/uL (ref 0.7–3.1)
Lymphs: 34 %
MCH: 28.4 pg (ref 26.6–33.0)
MCHC: 32.3 g/dL (ref 31.5–35.7)
MCV: 88 fL (ref 79–97)
Monocytes Absolute: 0.6 10*3/uL (ref 0.1–0.9)
Monocytes: 7 %
Neutrophils Absolute: 4.2 10*3/uL (ref 1.4–7.0)
Neutrophils: 56 %
Platelets: 293 10*3/uL (ref 150–450)
RBC: 3.84 x10E6/uL (ref 3.77–5.28)
RDW: 12.7 % (ref 11.7–15.4)
WBC: 7.6 10*3/uL (ref 3.4–10.8)

## 2019-01-26 LAB — CMP14 + ANION GAP
ALT: 15 IU/L (ref 0–32)
AST: 14 IU/L (ref 0–40)
Albumin/Globulin Ratio: 1.5 (ref 1.2–2.2)
Albumin: 4.1 g/dL (ref 3.7–4.7)
Alkaline Phosphatase: 82 IU/L (ref 39–117)
Anion Gap: 16 mmol/L (ref 10.0–18.0)
BUN/Creatinine Ratio: 11 — ABNORMAL LOW (ref 12–28)
BUN: 12 mg/dL (ref 8–27)
Bilirubin Total: 0.5 mg/dL (ref 0.0–1.2)
CO2: 26 mmol/L (ref 20–29)
Calcium: 9.5 mg/dL (ref 8.7–10.3)
Chloride: 103 mmol/L (ref 96–106)
Creatinine, Ser: 1.06 mg/dL — ABNORMAL HIGH (ref 0.57–1.00)
GFR calc Af Amer: 61 mL/min/{1.73_m2} (ref >59)
GFR calc non Af Amer: 53 mL/min/{1.73_m2} — ABNORMAL LOW (ref >59)
Globulin, Total: 2.7 g/dL (ref 1.5–4.5)
Glucose: 121 mg/dL — ABNORMAL HIGH (ref 65–99)
Potassium: 3.9 mmol/L (ref 3.5–5.2)
Sodium: 145 mmol/L — ABNORMAL HIGH (ref 134–144)
Total Protein: 6.8 g/dL (ref 6.0–8.5)

## 2019-01-27 ENCOUNTER — Encounter: Payer: Self-pay | Admitting: Nurse Practitioner

## 2019-02-08 DIAGNOSIS — U071 COVID-19: Secondary | ICD-10-CM | POA: Diagnosis not present

## 2019-02-13 ENCOUNTER — Other Ambulatory Visit: Payer: Self-pay | Admitting: Physician Assistant

## 2019-02-15 ENCOUNTER — Other Ambulatory Visit: Payer: Self-pay | Admitting: *Deleted

## 2019-02-15 ENCOUNTER — Telehealth: Payer: Self-pay | Admitting: Internal Medicine

## 2019-02-15 NOTE — Telephone Encounter (Signed)
Pls contact pharmacy [phone] 

## 2019-02-15 NOTE — Telephone Encounter (Signed)
rtc to pharm[facility], verified script

## 2019-02-16 MED ORDER — VALSARTAN-HYDROCHLOROTHIAZIDE 320-25 MG PO TABS: 1.0000 | ORAL_TABLET | Freq: Every day | ORAL | 3 refills | Status: DC

## 2019-02-16 MED ORDER — ATORVASTATIN CALCIUM 80 MG PO TABS: 80.0000 mg | ORAL_TABLET | Freq: Every day | ORAL | 3 refills | Status: DC

## 2019-02-16 MED ORDER — CARVEDILOL 12.5 MG PO TABS: 12.5000 mg | ORAL_TABLET | Freq: Two times a day (BID) | ORAL | 3 refills | Status: DC

## 2019-02-18 ENCOUNTER — Encounter: Payer: Self-pay | Admitting: Nurse Practitioner

## 2019-02-18 ENCOUNTER — Telehealth: Payer: Self-pay

## 2019-02-18 ENCOUNTER — Ambulatory Visit (INDEPENDENT_AMBULATORY_CARE_PROVIDER_SITE_OTHER): Payer: Medicare Other | Admitting: Nurse Practitioner

## 2019-02-18 VITALS — BP 146/74 | HR 84 | Temp 97.9°F | Ht 64.5 in | Wt 220.8 lb

## 2019-02-18 DIAGNOSIS — Z01818 Encounter for other preprocedural examination: Secondary | ICD-10-CM

## 2019-02-18 DIAGNOSIS — Z86010 Personal history of colonic polyps: Secondary | ICD-10-CM | POA: Diagnosis not present

## 2019-02-18 DIAGNOSIS — Z7901 Long term (current) use of anticoagulants: Secondary | ICD-10-CM

## 2019-02-18 DIAGNOSIS — D649 Anemia, unspecified: Secondary | ICD-10-CM

## 2019-02-18 NOTE — Telephone Encounter (Signed)
Fine to hold plavix x 5 days thanks

## 2019-02-18 NOTE — Patient Instructions (Addendum)
If you are age 72 or older, your body mass index should be between 23-30. Your Body mass index is 37.31 kg/m. If this is out of the aforementioned range listed, please consider follow up with your Primary Care Provider.  If you are age 24 or younger, your body mass index should be between 19-25. Your Body mass index is 37.31 kg/m. If this is out of the aformentioned range listed, please consider follow up with your Primary Care Provider.   You have been scheduled for a colonoscopy. Please follow written instructions given to you at your visit today.  Please pick up your prep supplies at the pharmacy within the next 1-3 days. If you use inhalers (even only as needed), please bring them with you on the day of your procedure. Your physician has requested that you go to www.startemmi.com and enter the access code given to you at your visit today. This web site gives a general overview about your procedure. However, you should still follow specific instructions given to you by our office regarding your preparation for the procedure.  You will be contacted by our office prior to your procedure for directions on holding your Plavix.  If you do not hear from our office 1 week prior to your scheduled procedure, please call [phone] to discuss.    Further follow up to be determined after colonoscopy completed.  Thank you for choosing me and [facility].

## 2019-02-18 NOTE — Progress Notes (Signed)
02/18/2019 [name] [id number] October 13, 1945   HISTORY OF PRESENT ILLNESS: [name] is a 74 year old female with a past medical history significant for depression, hypertension, coronary artery disease status post stent placement to the RCA 07/2014 on Plavix and aspirin, diabetes mellitus type 2, CVA, hypothyroidism, past mild obstructive sleep apnea (patient reports this issue has resolved) and colon polyps.  Past appendectomy and umbilical hernia repair.  She presents today to schedule a colonoscopy.  She has infrequent vague discomfort around the umbilicus which she attributes to her past hernia repair.  She is passing normal formed brown bowel movement every other day.  No rectal bleeding or melena.  She denies having any heartburn, dysphagia or stomach pain.  She has a history of coronary artery disease with the placement of 1 stent on Plavix and aspirin.  She is followed by cardiologist Dr. Burt Knack once yearly.  She denies having any chest pain, palpitations, dizziness or shortness of breath.  She has gained 10 pounds since the end of December 2020.  She acknowledges the need to lose weight.  No family history of colon cancer.  Sister with history of brain cancer in remission.  Her brother died from brain cancer and lung cancer.  No alcohol. Past smoker.  No other complaints today.   Labs 01/25/2019: WBC 7.6.  Hemoglobin 10.9.  Hematocrit 33.7.  MCV 88.  Platelet 293.  BUN 12.  Creatinine 1.06.  GFR 61.  Alk phos 82.  Albumin 4.1.  AST 14.  ALT 15.  Total bili 0.5.  Colonoscopy 01/03/2014 [name]:  1.  Diminutive transverse colon polyp, biopsy showed polypoid mucosa and not a true polyp. 2.  Sigmoid diverticulosis. 3.  Otherwise normal colonoscopy. - Repeat colonoscopy in 2020 or 2021  Colonoscopy 10/24/2004 [name]: 1.  Appendicolith present 2.  8 mm sessile tubular adenomatous polyp to the rectum. 3. Difficulty entering the cecum  Past Medical History:  Diagnosis Date  .  Back pain    reason unknown  . CAD (coronary artery disease)    a. 07/2014: DES to OM1  b. 02/2015: CTO to RCA   . Cervical lymphadenopathy   . Cervical polyp   . Coronary artery disease    Prior stent of the lCX with chronic total occlusion of a small RCA. Last cath in 2004 showing patent stent, nonobstructive disease and total RCA. EF is 65 to 70%. She has been managed medically.   . Depression    sees a counselor at [facility] but no meds  . Diabetes mellitus without complication ([place])    takes Metformin daily  . History of colon polyps   . Hyperlipidemia    takes Crestor daily  . Hypertension    takes Amlodipine,Coreg,and Prinzide daily  . Hypothyroidism    takes Synthroid daily  . Mild obstructive sleep apnea 10/16/2012   03/12/2006: Moderate Obstructive sleep apnea with a Respiratory Disturbance Index of 29 events per hour.  05/26/13 Mild obstructive sleep apnea/hypopnea syndrome, AHI 7.5 per hour with events in all positions, especially supine. REM AHI 25.9 per hour. Moderately loud snoring with oxygen desaturation to a nadir of 82% and mean oxygen saturation through the study of 92.3% on room air.  07/31/18: Sleep s  . Neck mass   . OSA (obstructive sleep apnea)    PT DENIES, STATES SHE WAS TOLD SHE DIDN'T HAVE OSA, NO CPAP  . PTSD (post-traumatic stress disorder)   . Renal artery stenosis ([place]) 2007  .  Stroke ([place])    TIA- LAST ONE GREATER THAN 10 YEARS PER PT  . TIA (transient ischemic attack)   . Tubular adenoma of rectum 10/24/04  . Umbilical hernia   . Urinary tract infection    Past Surgical History:  Procedure Laterality Date  . ANKLE SURGERY Left    plates and screws  . APPENDECTOMY    . CARDIAC CATHETERIZATION  2005/2006/2007/2010  . CARDIAC CATHETERIZATION N/A 07/18/2014   Procedure: Left Heart Cath and Coronary Angiography;  Surgeon: Lorretta Harp, MD; LAD patent, RCA 100% with L-R collaterals, OM1 80% stenosed proximal to previously placed stent, 30% ISR,  EF 55%  . CARDIAC CATHETERIZATION N/A 02/13/2015   Procedure: Left Heart Cath and Coronary Angiography;  Surgeon: Sherren Mocha, MD;  Location: [facility];  Service: Cardiovascular;  Laterality: N/A;  . CARDIAC CATHETERIZATION Right 02/13/2015   Procedure: Coronary Stent Intervention;  Surgeon: Sherren Mocha, MD;  Location: [place] [facility];  Service: Cardiovascular;  Laterality: Right;  . CARDIAC CATHETERIZATION N/A 07/10/2015   Procedure: Left Heart Cath and Coronary Angiography;  Surgeon: Leonie Man, MD;  Location: [facility];  Service: Cardiovascular;  Laterality: N/A;  . COLONOSCOPY  10/2004  . CORONARY ANGIOPLASTY     x 1  . CORONARY STENT PLACEMENT  1998   NIR 3.0 x 25 to the OM1  . CORONARY STENT PLACEMENT  07/18/2014   SYNERGY DES 2.5X12 to the OM1   . INSERTION OF MESH N/A 05/27/2013   Procedure: INSERTION OF MESH;  Surgeon: Imogene Burn. Georgette Dover, MD;  Location: [facility];  Service: General;  Laterality: N/A;  . PERCUTANEOUS CORONARY STENT INTERVENTION (PCI-S)  02/13/2015   des  to proximal circumflex  . RENAL ARTERY STENT Bilateral 2007   Dr. Albertine Patricia  . UMBILICAL HERNIA REPAIR    . UMBILICAL HERNIA REPAIR N/A 05/27/2013   Procedure: HERNIA REPAIR UMBILICAL ADULT;  Surgeon: Imogene Burn. Georgette Dover, MD;  Location: [facility];  Service: General;  Laterality: N/A;    reports that she has quit smoking. She has never used smokeless tobacco. She reports current alcohol use. She reports that she does not use drugs. family history includes Alzheimer's disease in her father; Heart attack in her maternal grandfather; Hypertension in her mother; Osteoarthritis in her mother. No Known Allergies    Outpatient Encounter Medications as of 02/18/2019  Medication Sig  . amLODipine (NORVASC) 5 MG tablet Take 1 tablet (5 mg total) by mouth daily.  Marland Kitchen aspirin 81 MG tablet Take 1 tablet (81 mg total) by mouth daily.  Marland Kitchen atorvastatin (LIPITOR) 80 MG tablet Take 1 tablet (80 mg total) by mouth daily.  .  carvedilol (COREG) 12.5 MG tablet Take 1 tablet (12.5 mg total) by mouth 2 (two) times daily with a meal.  . clopidogrel (PLAVIX) 75 MG tablet Take 1 tablet by mouth once daily  . EQ ALLERGY RELIEF 10 MG tablet TAKE 1 TABLET BY MOUTH ONCE DAILY  . isosorbide mononitrate (IMDUR) 60 MG 24 hr tablet Take 1 tablet (60 mg total) by mouth daily.  Marland Kitchen levothyroxine (SYNTHROID) 100 MCG tablet Take 1 tablet (100 mcg total) by mouth daily before breakfast.  . metFORMIN (GLUCOPHAGE-XR) 500 MG 24 hr tablet TAKE TWO TABLETS BY MOUTH ONCE DAILY WITH  BREAKFAST  . nitroGLYCERIN (NITROSTAT) 0.4 MG SL tablet DISSOLVE ONE TABLET UNDER THE TONGUE EVERY 5 MINUTES AS NEEDED FOR CHEST PAIN.  DO NOT EXCEED A TOTAL OF 3 DOSES IN 15 MINUTES  .  spironolactone (ALDACTONE) 25 MG tablet Take 1 tablet (25 mg total) by mouth daily.  . valsartan-hydrochlorothiazide (DIOVAN-HCT) 320-25 MG tablet Take 1 tablet by mouth daily.  . fluticasone (FLONASE) 50 MCG/ACT nasal spray Place 1 spray into both nostrils daily.   No facility-administered encounter medications on file as of 02/18/2019.   Family History  Problem Relation Age of Onset  . Hypertension Mother   . Osteoarthritis Mother   . Alzheimer's disease Father   . Heart attack Maternal Grandfather   . Cancer Neg Hx   . Stroke Neg Hx   . Colon cancer Neg Hx   . Esophageal cancer Neg Hx   . Stomach cancer Neg Hx   . Rectal cancer Neg Hx      Social History   Socioeconomic History  . Marital status: Single    Spouse name: Not on file  . Number of children: Not on file  . Years of education: Not on file  . Highest education level: Not on file  Occupational History  . Not on file  Tobacco Use  . Smoking status: Former Research scientist (life sciences)  . Smokeless tobacco: Never Used  . Tobacco comment: quit smoking about 30 yrs ago  Substance and Sexual Activity  . Alcohol use: Yes    Alcohol/week: 0.0 standard drinks    Comment: Wine on special occasions  . Drug use: No  . Sexual  activity: Not on file  Other Topics Concern  . Not on file  Social History Narrative  . Not on file   Social Determinants of Health   Financial Resource Strain:   . Difficulty of Paying Living Expenses: Not on file  Food Insecurity:   . Worried About Charity fundraiser in the Last Year: Not on file  . Ran Out of Food in the Last Year: Not on file  Transportation Needs:   . Lack of Transportation (Medical): Not on file  . Lack of Transportation (Non-Medical): Not on file  Physical Activity:   . Days of Exercise per Week: Not on file  . Minutes of Exercise per Session: Not on file  Stress:   . Feeling of Stress : Not on file  Social Connections:   . Frequency of Communication with Friends and Family: Not on file  . Frequency of Social Gatherings with Friends and Family: Not on file  . Attends Religious Services: Not on file  . Active Member of Clubs or Organizations: Not on file  . Attends Archivist Meetings: Not on file  . Marital Status: Not on file  Intimate Partner Violence:   . Fear of Current or Ex-Partner: Not on file  . Emotionally Abused: Not on file  . Physically Abused: Not on file  . Sexually Abused: Not on file   REVIEW OF SYSTEMS  : All other systems reviewed and negative except where noted in the History of Present Illness.   PHYSICAL EXAM: BP (!) 146/74   Pulse 84   Temp 97.9 F (36.6 C) (Oral)   Ht 5' 4.5" (1.638 m)   Wt 220 lb 12.8 oz (100.2 kg)   BMI 37.31 kg/m  General: Well developed 74 year old female in no acute distress. Head: Normocephalic and atraumatic. Eyes:  Sclerae non-icteric, conjunctive pink. Ears: Normal auditory acuity. Neck: Supple, no lymphadenopathy or thyromegaly.  Lungs: Clear bilaterally to auscultation without wheezes, crackles or rhonchi. Heart: Regular rate and rhythm. Distant S1,S2. No murmur, rub or gallop appreciated.  Abdomen: Soft, nontender, non distended. No  masses. No hepatosplenomegaly. Normoactive  bowel sounds x 4 quadrants.  Rectal: Deferred.  Musculoskeletal: Symmetrical with no gross deformities. Skin: Warm and dry. No rash or lesions on visible extremities. Extremities: No edema. Neurological: Alert oriented x 4, no focal deficits.  Psychological:  Alert and cooperative. Normal mood and affect.  ASSESSMENT AND PLAN:  63. 74 year old female with a history of colon polyps -Colonoscopy benefits and risk discussed including risk with sedation, bleeding perforation -Our office will contact your cardiologist to verify Plavix instructions prior to your colonoscopy -Further follow-up to be determined after colonoscopy completed  2. Normocytic Anemia. 12/21 Hg 10.9. HCT 33.7. MCV 88. Baseline Hg 11.4. No signs of active GI bleeding. No GERD sx. No rectal bleeding or melena.  -CBC, anemia panel  -If she is iron deficient, EGD will be added to colonoscopy orders  3. Diabetes mellitus type 2  4. History of CAD with one stent on Plavix and aspirin      CC:  Lucious Groves, DO

## 2019-02-18 NOTE — Progress Notes (Signed)
Cardiology Office Note:    Date:  02/19/2019   ID:  [name], DOB 1945-03-28, MRN [id number]  PCP:  Lucious Groves, DO  Cardiologist:  Sherren Mocha, MD   Electrophysiologist:  None   Referring MD: Lucious Groves, DO   Chief Complaint  Patient presents with  . Surgical Clearance     History of Present Illness:    [name] is a 74 y.o. female with:   Coronary artery disease ? Known chronic occlusion of the RCA ? S/p DES to the LCx in 2016 ? Cath 6/17: LCx stent patent; CTO of RCA noted  Diabetes mellitus  Hyperlipidemia  Hypertension  Prior TIA  [name] was last seen in Aug 2020 via Telemedicine.  She returns for surgical clearance.  She needs a colonoscopy w [name].  Since last seen, she has not had chest discomfort.  She does note shortness of breath with exertion.  This is a fairly chronic symptom.  She notes elevated heart rate when she exerts herself.  She would like to exercise more.  She has not had orthopnea, PND, leg swelling, syncope.  Her pressure has been up the last couple of days.  It is usually 120s/60s.  She may have had more salt in her diet than usual.  Prior CV studies:   The following studies were reviewed today:  Echo 07/11/2015 Mod LVH, EF 60-65, no RWMA, Gr 1 DD  Cardiac Catheterization6/06/2015 LM ost 25 LAD prox 25 LCx prox stent into OM2 patent  RCA known to be occluded LVEDP 40    Myoview 01/31/15 Intermediate risk stress nuclear study with small, moderate intensity, reversible distal anterior and basal lateral defects consistent with ischemia in both distributions; EF 63 with normal wall motion.  Carotid US 01/31/14 Bilateral ICA 1-39  Past Medical History:  Diagnosis Date  . Back pain    reason unknown  . CAD (coronary artery disease)    a. 07/2014: DES to OM1  b. 02/2015: CTO to RCA   . Cervical lymphadenopathy   . Cervical polyp   . Coronary artery disease    Prior stent of the lCX with chronic total  occlusion of a small RCA. Last cath in 2004 showing patent stent, nonobstructive disease and total RCA. EF is 65 to 70%. She has been managed medically.   . Depression    sees a counselor at [facility] but no meds  . Diabetes mellitus without complication ([place])    takes Metformin daily  . History of colon polyps   . Hyperlipidemia    takes Crestor daily  . Hypertension    takes Amlodipine,Coreg,and Prinzide daily  . Hypothyroidism    takes Synthroid daily  . Mild obstructive sleep apnea 10/16/2012   03/12/2006: Moderate Obstructive sleep apnea with a Respiratory Disturbance Index of 29 events per hour.  05/26/13 Mild obstructive sleep apnea/hypopnea syndrome, AHI 7.5 per hour with events in all positions, especially supine. REM AHI 25.9 per hour. Moderately loud snoring with oxygen desaturation to a nadir of 82% and mean oxygen saturation through the study of 92.3% on room air.  07/31/18: Sleep s  . Neck mass   . OSA (obstructive sleep apnea)    PT DENIES, STATES SHE WAS TOLD SHE DIDN'T HAVE OSA, NO CPAP  . PTSD (post-traumatic stress disorder)   . Renal artery stenosis ([place]) 2007  . Stroke ([place])    TIA- LAST ONE GREATER THAN 10 YEARS PER PT  . TIA (transient ischemic attack)   .  Tubular adenoma of rectum 10/24/04  . Umbilical hernia   . Urinary tract infection    Surgical Hx: The patient  has a past surgical history that includes Cardiac catheterization (2005/2006/2007/2010); Colonoscopy (10/2004); Appendectomy; Ankle surgery (Left); Coronary angioplasty; Umbilical hernia repair; Umbilical hernia repair (N/A, 05/27/2013); Insertion of mesh (N/A, 05/27/2013); Cardiac catheterization (N/A, 07/18/2014); Renal artery stent (Bilateral, 2007); Coronary stent placement (1998); Coronary stent placement (07/18/2014); Percutaneous coronary stent intervention (pci-s) (02/13/2015); Cardiac catheterization (N/A, 02/13/2015); Cardiac catheterization (Right, 02/13/2015); and Cardiac catheterization (N/A,  07/10/2015).   Current Medications: Current Meds  Medication Sig  . amLODipine (NORVASC) 5 MG tablet Take 1 tablet (5 mg total) by mouth daily.  Marland Kitchen aspirin 81 MG tablet Take 1 tablet (81 mg total) by mouth daily.  Marland Kitchen atorvastatin (LIPITOR) 80 MG tablet Take 1 tablet (80 mg total) by mouth daily.  . carvedilol (COREG) 12.5 MG tablet Take 1 tablet (12.5 mg total) by mouth 2 (two) times daily with a meal.  . clopidogrel (PLAVIX) 75 MG tablet Take 1 tablet by mouth once daily  . EQ ALLERGY RELIEF 10 MG tablet TAKE 1 TABLET BY MOUTH ONCE DAILY  . fluticasone (FLONASE) 50 MCG/ACT nasal spray Place 1 spray into both nostrils daily.  . isosorbide mononitrate (IMDUR) 60 MG 24 hr tablet Take 1 tablet (60 mg total) by mouth daily.  Marland Kitchen levothyroxine (SYNTHROID) 100 MCG tablet Take 1 tablet (100 mcg total) by mouth daily before breakfast.  . metFORMIN (GLUCOPHAGE-XR) 500 MG 24 hr tablet TAKE TWO TABLETS BY MOUTH ONCE DAILY WITH  BREAKFAST  . nitroGLYCERIN (NITROSTAT) 0.4 MG SL tablet DISSOLVE ONE TABLET UNDER THE TONGUE EVERY 5 MINUTES AS NEEDED FOR CHEST PAIN.  DO NOT EXCEED A TOTAL OF 3 DOSES IN 15 MINUTES  . spironolactone (ALDACTONE) 25 MG tablet Take 1 tablet (25 mg total) by mouth daily.  . valsartan-hydrochlorothiazide (DIOVAN-HCT) 320-25 MG tablet Take 1 tablet by mouth daily.  . [DISCONTINUED] isosorbide mononitrate (IMDUR) 60 MG 24 hr tablet Take 1 tablet (60 mg total) by mouth daily.     Allergies:   Patient has no known allergies.   Social History   Tobacco Use  . Smoking status: Former Research scientist (life sciences)  . Smokeless tobacco: Never Used  . Tobacco comment: quit smoking about 30 yrs ago  Substance Use Topics  . Alcohol use: Yes    Alcohol/week: 0.0 standard drinks    Comment: Wine on special occasions  . Drug use: No     Family Hx: The patient's family history includes Alzheimer's disease in her father; Heart attack in her maternal grandfather; Hypertension in her mother; Osteoarthritis in her  mother. There is no history of Cancer, Stroke, Colon cancer, Esophageal cancer, Stomach cancer, or Rectal cancer.  ROS:   Please see the history of present illness.    ROS All other systems reviewed and are negative.   EKGs/Labs/Other Test Reviewed:    EKG:  EKG is  ordered today.  The ekg ordered today demonstrates normal sinus rhythm, heart rate 66, normal axis, T wave inversions V1 5-V6, septal Q waves, QTC 423, no change when compared to prior tracing  Recent Labs: 06/25/2018: NT-Pro BNP 145 01/21/2019: TSH 1.420 01/25/2019: ALT 15; BUN 12; Creatinine, Ser 1.06; Hemoglobin 10.9; Platelets 293; Potassium 3.9; Sodium 145   Recent Lipid Panel Lab Results  Component Value Date/Time   CHOL 110 01/22/2018 10:26 AM   TRIG 69 01/22/2018 10:26 AM   HDL 38 (L) 01/22/2018 10:26 AM   CHOLHDL  2.9 01/22/2018 10:26 AM   CHOLHDL 6 08/25/2014 07:41 AM   LDLCALC 58 01/22/2018 10:26 AM   LDLDIRECT 196.8 07/11/2010 11:07 AM    Physical Exam:    VS:  BP (!) 142/84   Pulse 66   Ht 5' 4.5" (1.638 m)   Wt 219 lb 6.4 oz (99.5 kg)   SpO2 95%   BMI 37.08 kg/m     Wt Readings from Last 3 Encounters:  02/19/19 219 lb 6.4 oz (99.5 kg)  02/18/19 220 lb 12.8 oz (100.2 kg)  01/21/19 217 lb 14.4 oz (98.8 kg)     Physical Exam  Constitutional: She is oriented to person, place, and time. She appears well-developed and well-nourished. No distress.  HENT:  Head: Normocephalic and atraumatic.  Eyes: No scleral icterus.  Neck: No JVD present. No thyromegaly present.  Cardiovascular: Normal rate, regular rhythm and normal heart sounds.  No murmur heard. Pulmonary/Chest: Effort normal and breath sounds normal. She has no rales.  Abdominal: Soft. There is no hepatomegaly.  Musculoskeletal:        General: No edema.  Lymphadenopathy:    She has no cervical adenopathy.  Neurological: She is alert and oriented to person, place, and time.  Skin: Skin is warm and dry.  Psychiatric: She has a normal  mood and affect.    ASSESSMENT & PLAN:    1. Preoperative cardiovascular examination The Revised Cardiac Risk Index indicates that her Perioperative Risk of Major Cardiac Event is (%): 6.6.  Therefore, she is at high risk for perioperative complications.  Her Functional Capacity in METs is good at 4.74 as indicated by the Duke Activity Status Index (DASI). According to ACC/AHA guidelines, no further cardiovascular testing needed.  The patient may proceed to surgery at acceptable risk.   She may hold Clopidogrel for 5 days prior to her procedure and resume as soon as possible post op.     2. Coronary artery disease involving native coronary artery of native heart with angina pectoris ([place]) History of stenting to the LCx in 2016.  Cardiac catheterization 2017 demonstrated patent LCx stent.  She has known chronic occlusion of the RCA.  She is not having angina.  She does note dyspnea on exertion and I suspect this is related to deconditioning.  There is no evidence of volume excess on exam.  I have recommended that she slowly increase activity.  Continue ASA, Clopidogrel, Amlodipine, Atorvastatin, Carvedilol, Isosorbide, Valsartan.  FU in 6 mos.   3. Essential hypertension BP above goal.  I have asked her to keep an eye on this and let is know if it remains elevated.    4. Hyperlipidemia, unspecified hyperlipidemia type Continue high intensity statin.  Arrange follow up fasting Lipids, CMET.     Dispo:  Return in about 6 months (around 08/19/2019) for Routine Follow Up, w/ Dr. Burt Knack, or Richardson Dopp, PA-C, (virtual or in-person).   Medication Adjustments/Labs and Tests Ordered: Current medicines are reviewed at length with the patient today.  Concerns regarding medicines are outlined above.  Tests Ordered: Orders Placed This Encounter  Procedures  . Comp Met (CMET)  . Lipid Profile  . EKG 12-Lead   Medication Changes: Meds ordered this encounter  Medications  . isosorbide mononitrate  (IMDUR) 60 MG 24 hr tablet    Sig: Take 1 tablet (60 mg total) by mouth daily.    Dispense:  90 tablet    Refill:  3    Dose increase    Signed, Nicki Reaper  Jorene Minors  02/19/2019 10:17 AM    [facility] [address], [place], [place]  [id number] Phone: [phone]; Fax: [phone]

## 2019-02-18 NOTE — Telephone Encounter (Signed)
 [facility] Pre-operative Risk Assessment     Request for surgical clearance:     Endoscopy Procedure  What type of surgery is being performed?     Colonoscopy  When is this surgery scheduled?     03/10/19  What type of clearance is required ?   Pharmacy  Are there any medications that need to be held prior to surgery and how long? HOLD PLAVIX FOR FIVE DAYS PRIOR  Practice name and name of physician performing surgery?      [facility]/[name]  What is your office phone and fax number?      Phone- [phone]  Fax- [phone] Attn: Pollyann Kennedy  Anesthesia type (None, local, MAC, general) ?       MAC Attn: Dr. Burt Knack

## 2019-02-18 NOTE — Telephone Encounter (Signed)
   Primary Cardiologist: Sherren Mocha, MD  Chart reviewed as part of pre-operative protocol coverage. Patient has a history of CAD with known CTO of RCA s/p DES to LCX in 2016, HTN, HLD, DM, prior TIA, and morbid obesity. She was last seen by Richardson Dopp, PA-C, for virtual visit in 10/05/2018 at which time she reported feeling fatigued at times but was otherwise doing well from a cardiac standpoint. I called and spoke with patient today. She notes shortness of breath with activities such as walking. She states this is not new and she does not think it is any worse than usual. However, she notes having to stop to catch her breath at times, such as when she walks up slight incline coming back from her mailbox. She also notes noticeable increase in her heart rate with walking. No chest pain, lightheadedness, dizziness, syncope, or acute CHF symptoms.   I suspect that shortness of breath and increased heart rate with activity are due to obesity and deconditioning, and I suspect that she would be OK for a colonoscopy which is a low risk procedure. However, patient has not had an EKG since 04/2017 and therefore would like to make sure she has no new ST/T changes as dyspnea could be an anginal equivalent. I was able to get patient scheduled for an office visit with Nicki Reaper tomorrow at 9:45am. We can check a EKG and complete pre-op assessment at that time  I will route request form to Nicki Reaper so that it is available at time of appointment tomorrow and then remove from pre-op pool.  Darreld Mclean, PA-C 02/18/2019, 1:04 PM

## 2019-02-18 NOTE — Telephone Encounter (Signed)
Hi Dr. Burt Knack! Patient has a colonoscopy planned for 03/10/2019. Can you please comment on if it is OK to hold Plavix for 5 days prior to procedure? Patient has a history of CAD with known chronic occlusion of RCA s/p DES to LCX in 2016. Most recent cath in 07/2015 showed patent LCX. Also has history of prior TIA, HTN, HLD, DM. She was last seen by [facility] in 09/2018 for a virtual visit and was doing well at that time.  Please route response back to P CV DIV PREOP.  Thank you!

## 2019-02-18 NOTE — Telephone Encounter (Signed)
Lab orders placed per provider's request;  Attempted to reach patient-unable to leave a VM-will attempt to reach patient at a later date/time;

## 2019-02-18 NOTE — Telephone Encounter (Signed)
-----   Message from Noralyn Pick, NP sent at 02/18/2019  3:58 PM EST ----- Regarding: lab order Bre, can you contact pt and let her know  I would like her to have labs done. Pls enter order for CBC, anemia panel. DX code Anemia unspecified. She can do the labs tomorrow or any time next week is fine. Thx

## 2019-02-19 ENCOUNTER — Encounter: Payer: Self-pay | Admitting: Physician Assistant

## 2019-02-19 ENCOUNTER — Ambulatory Visit (INDEPENDENT_AMBULATORY_CARE_PROVIDER_SITE_OTHER): Payer: Medicare Other | Admitting: Physician Assistant

## 2019-02-19 ENCOUNTER — Other Ambulatory Visit: Payer: Self-pay

## 2019-02-19 VITALS — BP 142/84 | HR 66 | Ht 64.5 in | Wt 219.4 lb

## 2019-02-19 DIAGNOSIS — I25119 Atherosclerotic heart disease of native coronary artery with unspecified angina pectoris: Secondary | ICD-10-CM

## 2019-02-19 DIAGNOSIS — E785 Hyperlipidemia, unspecified: Secondary | ICD-10-CM | POA: Diagnosis not present

## 2019-02-19 DIAGNOSIS — Z01810 Encounter for preprocedural cardiovascular examination: Secondary | ICD-10-CM | POA: Diagnosis not present

## 2019-02-19 DIAGNOSIS — I10 Essential (primary) hypertension: Secondary | ICD-10-CM

## 2019-02-19 MED ORDER — ISOSORBIDE MONONITRATE ER 60 MG PO TB24: 60.0000 mg | ORAL_TABLET | Freq: Every day | ORAL | 3 refills | Status: DC

## 2019-02-19 NOTE — Patient Instructions (Addendum)
Medication Instructions:   Your physician recommends that you continue on your current medications as directed. Please refer to the Current Medication list given to you today.  *If you need a refill on your cardiac medications before your next appointment, please call your pharmacy*  Lab Work:  Your physician recommends that you return for lab work in 4 weeks on 03/19/19 at 10:00AM  If you have labs (blood work) drawn today and your tests are completely normal, you will receive your results only by: Marland Kitchen MyChart Message (if you have MyChart) OR . A paper copy in the mail If you have any lab test that is abnormal or we need to change your treatment, we will call you to review the results.  Testing/Procedures:  None ordered today  Follow-Up: At [facility], you and your health needs are our priority.  As part of our continuing mission to provide you with exceptional heart care, we have created designated Provider Care Teams.  These Care Teams include your primary Cardiologist (physician) and Advanced Practice Providers (APPs -  Physician Assistants and Nurse Practitioners) who all work together to provide you with the care you need, when you need it.  Your next appointment:   6 month(s)  The format for your next appointment:   In Person  Provider:   You may see Sherren Mocha, MD or one of the following Advanced Practice Providers on your designated Care Team:    Richardson Dopp, PA-C  Vin [place], [place]  Daune Perch, [place]

## 2019-02-19 NOTE — Telephone Encounter (Signed)
Attempted to reach patient-unable to leave a VM-will attempt to reach patient at a later date/time; 

## 2019-02-22 ENCOUNTER — Telehealth: Payer: Self-pay | Admitting: Cardiovascular Disease

## 2019-02-22 NOTE — Telephone Encounter (Signed)
Patient may proceed with planned procedure at acceptable risk. She may hold her Clopidogrel for 5 days prior to her procedure and resume post op as soon as it is safe. Please call with questions. Richardson Dopp, PA-C    02/22/2019 2:00 PM

## 2019-02-22 NOTE — Telephone Encounter (Signed)
New Message      We are recommending the COVID-19 vaccine to all of our patients. Cardiac medications (including blood thinners) should not deter anyone from being vaccinated and there is no need to hold any of those medications prior to vaccine administration.     Currently, there is a hotline to call (active 02/12/19) to schedule vaccination appointments as no walk-ins will be accepted.   Number: [phone]    If you have further questions or concerns about the vaccine process, please visit [email or web address] or contact your primary care physician.         

## 2019-02-22 NOTE — Telephone Encounter (Signed)
Attempted to reach patient-unable to leave a VM-will attempt to reach patient at a later date/time; 

## 2019-02-24 NOTE — Telephone Encounter (Signed)
Left message with patient's sister-Ovedia-emergency contact for patient to call back to the office to be informed of need for lab work to be done and to hold Plavix for 5 days prior to procedure;

## 2019-02-24 NOTE — Telephone Encounter (Signed)
Have you been able to get in touch with this patient? Thanks Bre

## 2019-02-24 NOTE — Telephone Encounter (Signed)
Patient returned call to the office- patient advised to come to [facility]. Office for repeat lab work at her convenience- patient agreeable to plan of care and will complete lab work as soon as she is able;  Patient also advised to hold Plavix beginning on 03/04/2019 for procedure on 03/10/2019; Patient verbalized understanding of information/instructions;   Patient advised to call back to the office at [phone] should questions/concerns arise;

## 2019-02-26 ENCOUNTER — Other Ambulatory Visit (INDEPENDENT_AMBULATORY_CARE_PROVIDER_SITE_OTHER): Payer: Medicare Other

## 2019-02-26 ENCOUNTER — Other Ambulatory Visit: Payer: Self-pay

## 2019-02-26 DIAGNOSIS — I10 Essential (primary) hypertension: Secondary | ICD-10-CM

## 2019-02-26 DIAGNOSIS — E785 Hyperlipidemia, unspecified: Secondary | ICD-10-CM | POA: Diagnosis not present

## 2019-02-26 DIAGNOSIS — D649 Anemia, unspecified: Secondary | ICD-10-CM

## 2019-02-26 DIAGNOSIS — I25119 Atherosclerotic heart disease of native coronary artery with unspecified angina pectoris: Secondary | ICD-10-CM | POA: Diagnosis not present

## 2019-02-26 LAB — CBC WITH DIFFERENTIAL/PLATELET
Basophils Absolute: 0 10*3/uL (ref 0.0–0.1)
Basophils Relative: 0.6 % (ref 0.0–3.0)
Eosinophils Absolute: 0.1 10*3/uL (ref 0.0–0.7)
Eosinophils Relative: 1.4 % (ref 0.0–5.0)
HCT: 33 % — ABNORMAL LOW (ref 36.0–46.0)
Hemoglobin: 10.9 g/dL — ABNORMAL LOW (ref 12.0–15.0)
Lymphocytes Relative: 32.1 % (ref 12.0–46.0)
Lymphs Abs: 2.6 10*3/uL (ref 0.7–4.0)
MCHC: 32.9 g/dL (ref 30.0–36.0)
MCV: 86 fl (ref 78.0–100.0)
Monocytes Absolute: 0.4 10*3/uL (ref 0.1–1.0)
Monocytes Relative: 5.1 % (ref 3.0–12.0)
Neutro Abs: 4.9 10*3/uL (ref 1.4–7.7)
Neutrophils Relative %: 60.8 % (ref 43.0–77.0)
Platelets: 268 10*3/uL (ref 150.0–400.0)
RBC: 3.84 Mil/uL — ABNORMAL LOW (ref 3.87–5.11)
RDW: 14.1 % (ref 11.5–15.5)
WBC: 8 10*3/uL (ref 4.0–10.5)

## 2019-02-26 NOTE — Addendum Note (Signed)
Addended by: Trenda Moots on: 0000000 10:12 AM   Modules accepted: Orders

## 2019-02-27 LAB — ANEMIA PANEL
Ferritin: 53 ng/mL (ref 15–150)
Hematocrit: 34.8 % (ref 34.0–46.6)
Iron Saturation: 16 % (ref 15–55)
Iron: 47 ug/dL (ref 27–139)
Retic Ct Pct: 1.5 % (ref 0.6–2.6)
Total Iron Binding Capacity: 286 ug/dL (ref 250–450)
UIBC: 239 ug/dL (ref 118–369)
Vitamin B-12: 579 pg/mL (ref 232–1245)

## 2019-02-27 LAB — COMPREHENSIVE METABOLIC PANEL
ALT: 13 IU/L (ref 0–32)
AST: 12 IU/L (ref 0–40)
Albumin/Globulin Ratio: 1.7 (ref 1.2–2.2)
Albumin: 4.1 g/dL (ref 3.7–4.7)
Alkaline Phosphatase: 78 IU/L (ref 39–117)
BUN/Creatinine Ratio: 11 — ABNORMAL LOW (ref 12–28)
BUN: 11 mg/dL (ref 8–27)
Bilirubin Total: 0.5 mg/dL (ref 0.0–1.2)
CO2: 27 mmol/L (ref 20–29)
Calcium: 9.2 mg/dL (ref 8.7–10.3)
Chloride: 101 mmol/L (ref 96–106)
Creatinine, Ser: 0.96 mg/dL (ref 0.57–1.00)
GFR calc Af Amer: 69 mL/min/{1.73_m2} (ref >59)
GFR calc non Af Amer: 60 mL/min/{1.73_m2} (ref >59)
Globulin, Total: 2.4 g/dL (ref 1.5–4.5)
Glucose: 152 mg/dL — ABNORMAL HIGH (ref 65–99)
Potassium: 3.3 mmol/L — ABNORMAL LOW (ref 3.5–5.2)
Sodium: 143 mmol/L (ref 134–144)
Total Protein: 6.5 g/dL (ref 6.0–8.5)

## 2019-02-27 LAB — LIPID PANEL
Chol/HDL Ratio: 2.9 ratio (ref 0.0–4.4)
Cholesterol, Total: 90 mg/dL — ABNORMAL LOW (ref 100–199)
HDL: 31 mg/dL — ABNORMAL LOW (ref >39)
LDL Chol Calc (NIH): 43 mg/dL (ref 0–99)
Triglycerides: 78 mg/dL (ref 0–149)
VLDL Cholesterol Cal: 16 mg/dL (ref 5–40)

## 2019-02-28 ENCOUNTER — Ambulatory Visit: Payer: Medicare Other | Attending: Internal Medicine

## 2019-02-28 DIAGNOSIS — Z23 Encounter for immunization: Secondary | ICD-10-CM | POA: Insufficient documentation

## 2019-03-01 ENCOUNTER — Telehealth: Payer: Self-pay | Admitting: Physician Assistant

## 2019-03-01 NOTE — Progress Notes (Signed)
   Covid-19 Vaccination Clinic  Name:  Janiylah Cooksley    MRN: [id number] DOB: 1945-04-27  02/28/2019  Ms. Crissey was observed post Covid-19 immunization for 15 minutes without incidence. She was provided with Vaccine Information Sheet and instruction to access the V-Safe system.   Ms. Brunken was instructed to call 911 with any severe reactions post vaccine: Marland Kitchen Difficulty breathing  . Swelling of your face and throat  . A fast heartbeat  . A bad rash all over your body  . Dizziness and weakness    Immunizations Administered    Name Date Dose VIS Date Route   Moderna COVID-19 Vaccine 02/28/2019  3:44 PM 0.5 mL 01/05/2019 Intramuscular   Manufacturer: Levan Hurst   Lot: [id number]   [id number]      Documented on behalf of: C. Jerline Pain

## 2019-03-01 NOTE — Telephone Encounter (Signed)
The patient is calling for lab results from Friday 1/22.  I told her that they have not resulted yet and that we will call her once resulted.  She verbalized understanding.

## 2019-03-01 NOTE — Telephone Encounter (Signed)
Patient is returning phone call in regards to lab results.

## 2019-03-08 ENCOUNTER — Other Ambulatory Visit: Payer: Medicare Other

## 2019-03-08 ENCOUNTER — Other Ambulatory Visit: Payer: Self-pay | Admitting: Internal Medicine

## 2019-03-08 ENCOUNTER — Other Ambulatory Visit: Payer: Self-pay

## 2019-03-08 ENCOUNTER — Ambulatory Visit (INDEPENDENT_AMBULATORY_CARE_PROVIDER_SITE_OTHER): Payer: Self-pay

## 2019-03-08 DIAGNOSIS — Z1159 Encounter for screening for other viral diseases: Secondary | ICD-10-CM | POA: Diagnosis not present

## 2019-03-08 DIAGNOSIS — E876 Hypokalemia: Secondary | ICD-10-CM

## 2019-03-08 DIAGNOSIS — D649 Anemia, unspecified: Secondary | ICD-10-CM

## 2019-03-08 LAB — BASIC METABOLIC PANEL
BUN: 10 mg/dL (ref 6–23)
CO2: 29 mEq/L (ref 19–32)
Calcium: 9 mg/dL (ref 8.4–10.5)
Chloride: 105 mEq/L (ref 96–112)
Creatinine, Ser: 0.87 mg/dL (ref 0.40–1.20)
GFR: 77.58 mL/min (ref >60.00)
Glucose, Bld: 147 mg/dL — ABNORMAL HIGH (ref 70–99)
Potassium: 3.3 mEq/L — ABNORMAL LOW (ref 3.5–5.1)
Sodium: 143 mEq/L (ref 135–145)

## 2019-03-08 LAB — SARS CORONAVIRUS 2 (TAT 6-24 HRS): SARS Coronavirus 2: NEGATIVE

## 2019-03-09 ENCOUNTER — Other Ambulatory Visit: Payer: Self-pay | Admitting: Internal Medicine

## 2019-03-09 ENCOUNTER — Telehealth: Payer: Self-pay | Admitting: Internal Medicine

## 2019-03-09 MED ORDER — POTASSIUM CHLORIDE CRYS ER 20 MEQ PO TBCR: 20.0000 meq | EXTENDED_RELEASE_TABLET | Freq: Every day | ORAL | 0 refills | Status: DC

## 2019-03-09 NOTE — Telephone Encounter (Signed)
Pharmacy informed.

## 2019-03-09 NOTE — Telephone Encounter (Signed)
Pharmacy calling to confirm with the doctor that it will be ok for the patient to be taking Spironolactone along with Potassium Chloride.

## 2019-03-09 NOTE — Telephone Encounter (Signed)
Please advise Sir? 

## 2019-03-09 NOTE — Telephone Encounter (Signed)
YES  K is 3.3

## 2019-03-10 ENCOUNTER — Encounter: Payer: Self-pay | Admitting: Internal Medicine

## 2019-03-10 ENCOUNTER — Other Ambulatory Visit: Payer: Self-pay

## 2019-03-10 ENCOUNTER — Other Ambulatory Visit (INDEPENDENT_AMBULATORY_CARE_PROVIDER_SITE_OTHER): Payer: Medicare Other

## 2019-03-10 ENCOUNTER — Ambulatory Visit (AMBULATORY_SURGERY_CENTER): Payer: Medicare Other | Admitting: Internal Medicine

## 2019-03-10 VITALS — BP 128/58 | HR 68 | Temp 97.1°F | Resp 17 | Ht 64.0 in | Wt 220.0 lb

## 2019-03-10 DIAGNOSIS — Z86010 Personal history of colonic polyps: Secondary | ICD-10-CM | POA: Diagnosis not present

## 2019-03-10 DIAGNOSIS — D120 Benign neoplasm of cecum: Secondary | ICD-10-CM

## 2019-03-10 DIAGNOSIS — Z1211 Encounter for screening for malignant neoplasm of colon: Secondary | ICD-10-CM | POA: Diagnosis not present

## 2019-03-10 DIAGNOSIS — D649 Anemia, unspecified: Secondary | ICD-10-CM | POA: Diagnosis not present

## 2019-03-10 LAB — CBC WITH DIFFERENTIAL/PLATELET
Basophils Absolute: 0 10*3/uL (ref 0.0–0.1)
Basophils Relative: 0.4 % (ref 0.0–3.0)
Eosinophils Absolute: 0.2 10*3/uL (ref 0.0–0.7)
Eosinophils Relative: 2.6 % (ref 0.0–5.0)
HCT: 35.8 % — ABNORMAL LOW (ref 36.0–46.0)
Hemoglobin: 11.7 g/dL — ABNORMAL LOW (ref 12.0–15.0)
Lymphocytes Relative: 34.3 % (ref 12.0–46.0)
Lymphs Abs: 2.8 10*3/uL (ref 0.7–4.0)
MCHC: 32.6 g/dL (ref 30.0–36.0)
MCV: 85.7 fl (ref 78.0–100.0)
Monocytes Absolute: 0.6 10*3/uL (ref 0.1–1.0)
Monocytes Relative: 7.1 % (ref 3.0–12.0)
Neutro Abs: 4.6 10*3/uL (ref 1.4–7.7)
Neutrophils Relative %: 55.6 % (ref 43.0–77.0)
Platelets: 304 10*3/uL (ref 150.0–400.0)
RBC: 4.18 Mil/uL (ref 3.87–5.11)
RDW: 14.3 % (ref 11.5–15.5)
WBC: 8.2 10*3/uL (ref 4.0–10.5)

## 2019-03-10 MED ORDER — SODIUM CHLORIDE 0.9 % IV SOLN: 500.0000 mL | Freq: Once | INTRAVENOUS | Status: AC

## 2019-03-10 NOTE — Op Note (Addendum)
Portal Patient Name: [name] Procedure Date: 03/10/2019 11:25 AM MRN: [id number] Endoscopist: Gatha Mayer , MD Age: 74 Referring MD:  Date of Birth: 1945-05-09 Gender: Female Account #: [id number] Procedure:                Colonoscopy Indications:              Surveillance: Personal history of adenomatous                            polyps on last colonoscopy > 5 years ago Medicines:                Propofol per Anesthesia, Monitored Anesthesia Care Procedure:                Pre-Anesthesia Assessment:                           - Prior to the procedure, a History and Physical                            was performed, and patient medications and                            allergies were reviewed. The patient's tolerance of                            previous anesthesia was also reviewed. The risks                            and benefits of the procedure and the sedation                            options and risks were discussed with the patient.                            All questions were answered, and informed consent                            was obtained. Prior Anticoagulants: The patient                            last took Plavix (clopidogrel) 5 days prior to the                            procedure. ASA Grade Assessment: III - A patient                            with severe systemic disease. After reviewing the                            risks and benefits, the patient was deemed in                            satisfactory condition to undergo the procedure.  After obtaining informed consent, the colonoscope                            was passed under direct vision. Throughout the                            procedure, the patient's blood pressure, pulse, and                            oxygen saturations were monitored continuously. The                            Colonoscope was introduced through the anus and     advanced to the the cecum, identified by                            appendiceal orifice and ileocecal valve. The                            colonoscopy was performed with moderate difficulty                            due to significant looping. Successful completion                            of the procedure was aided by applying abdominal                            pressure. The patient tolerated the procedure well.                            The quality of the bowel preparation was good. The                            bowel preparation used was Miralax via split dose                            instruction. The ileocecal valve, appendiceal                            orifice, and rectum were photographed. Scope In: 11:32:41 AM Scope Out: 12:00:02 PM Scope Withdrawal Time: 0 hours 13 minutes 21 seconds  Total Procedure Duration: 0 hours 27 minutes 21 seconds  Findings:                 The perianal and digital rectal examinations were                            normal.                           A 1 to 2 mm polyp was found in the cecum. The polyp  was sessile. The polyp was removed with a cold                            biopsy forceps. Resection and retrieval were                            complete. Verification of patient identification                            for the specimen was done. Estimated blood loss was                            minimal.                           Multiple diverticula were found in the sigmoid                            colon.                           The exam was otherwise without abnormality on                            direct and retroflexion views. Complications:            No immediate complications. Estimated Blood Loss:     Estimated blood loss was minimal. Impression:               - One 1 to 2 mm polyp in the cecum, removed with a                            cold biopsy forceps. Resected and retrieved.                            - Diverticulosis in the sigmoid colon.                           - The examination was otherwise normal on direct                            and retroflexion views.                           - Personal history of colonic polyp advanced                            adenoma rectum 2006 no precancerous polyps since. Recommendation:           - Patient has a contact number available for                            emergencies. The signs and symptoms of potential                            delayed complications were discussed with  the                            patient. Return to normal activities tomorrow.                            Written discharge instructions were provided to the                            patient.                           - Resume previous diet.                           - Continue present medications.                           - Await pathology results.                           - No recommendation at this time regarding repeat                            colonoscopy due to age.                           - CC: Dr. Joni Reining                           Start K-Dur 20 mEQ qd - Rx was sent yesterday - I                            ihave instructed her to call and get f/u Dr.                            Heber Matador later in Feb, early March for recjheck K                            (it was 3.3 x 2 a week apart)                           - Resume Plavix (clopidogrel) at prior dose                            tomorrow. Gatha Mayer, MD 03/10/2019 12:10:57 PM This report has been signed electronically.

## 2019-03-10 NOTE — Patient Instructions (Addendum)
I removed one tiny polyp. Not cancer - will get analyzed to see what it was.  You also have a condition called diverticulosis - common and not usually a problem. Please read the handout provided.  Nothing bad here. Please restart Plavix tomorrow.  I have sent a prescription for potassium to your pharmacy because we have checked twice and it is low.  Please be sure to follow-up with Dr. Heber [place] about this - you should call and make an appointment to see him in later February or early March to get potassium rechecked.  I appreciate the opportunity to care for you. Gatha Mayer, MD, FACG  YOU HAD AN ENDOSCOPIC PROCEDURE TODAY AT [facility]:   Refer to the procedure report that was given to you for any specific questions about what was found during the examination.  If the procedure report does not answer your questions, please call your gastroenterologist to clarify.  If you requested that your care partner not be given the details of your procedure findings, then the procedure report has been included in a sealed envelope for you to review at your convenience later.  YOU SHOULD EXPECT: Some feelings of bloating in the abdomen. Passage of more gas than usual.  Walking can help get rid of the air that was put into your GI tract during the procedure and reduce the bloating. If you had a lower endoscopy (such as a colonoscopy or flexible sigmoidoscopy) you may notice spotting of blood in your stool or on the toilet paper. If you underwent a bowel prep for your procedure, you may not have a normal bowel movement for a few days.  Please Note:  You might notice some irritation and congestion in your nose or some drainage.  This is from the oxygen used during your procedure.  There is no need for concern and it should clear up in a day or so.  SYMPTOMS TO REPORT IMMEDIATELY:   Following lower endoscopy (colonoscopy or flexible sigmoidoscopy):  Excessive amounts of blood in the  stool  Significant tenderness or worsening of abdominal pains  Swelling of the abdomen that is new, acute  Fever of 100F or higher   For urgent or emergent issues, a gastroenterologist can be reached at any hour by calling [phone].   DIET:  We do recommend a small meal at first, but then you may proceed to your regular diet.  Drink plenty of fluids but you should avoid alcoholic beverages for 24 hours.  ACTIVITY:  You should plan to take it easy for the rest of today and you should NOT DRIVE or use heavy machinery until tomorrow (because of the sedation medicines used during the test).    FOLLOW UP: Our staff will call the number listed on your records 48-72 hours following your procedure to check on you and address any questions or concerns that you may have regarding the information given to you following your procedure. If we do not reach you, we will leave a message.  We will attempt to reach you two times.  During this call, we will ask if you have developed any symptoms of COVID 19. If you develop any symptoms (ie: fever, flu-like symptoms, shortness of breath, cough etc.) before then, please call [phone].  If you test positive for Covid 19 in the 2 weeks post procedure, please call and report this information to [place].    If any biopsies were taken you will be contacted by phone or by  letter within the next 1-3 weeks.  Please call us at [phone] if you have not heard about the biopsies in 3 weeks.    SIGNATURES/CONFIDENTIALITY: You and/or your care partner have signed paperwork which will be entered into your electronic medical record.  These signatures attest to the fact that that the information above on your After Visit Summary has been reviewed and is understood.  Full responsibility of the confidentiality of this discharge information lies with you and/or your care-partner.

## 2019-03-10 NOTE — Progress Notes (Signed)
Temp check by:LC Vital check by:KA  The medical and surgical history was reviewed and verified with the patient. 

## 2019-03-10 NOTE — Progress Notes (Signed)
Called to room to assist during endoscopic procedure.  Patient ID and intended procedure confirmed with present staff. Received instructions for my participation in the procedure from the performing physician.  

## 2019-03-10 NOTE — Progress Notes (Signed)
To PACU, VSS. Report to Rn.tb 

## 2019-03-12 ENCOUNTER — Telehealth: Payer: Self-pay

## 2019-03-12 NOTE — Telephone Encounter (Signed)
First post op follow up call, no answer

## 2019-03-12 NOTE — Telephone Encounter (Signed)
  Follow up Call-  Call back number 03/10/2019  Post procedure Call Back phone  # [phone] or [phone]  Permission to leave phone message Yes  Some recent data might be hidden     Patient questions:  Do you have a fever, pain , or abdominal swelling? No. Pain Score  0 *  Have you tolerated food without any problems? Yes.    Have you been able to return to your normal activities? Yes.    Do you have any questions about your discharge instructions: Diet   No. Medications  No. Follow up visit  No.  Do you have questions or concerns about your Care? No.  Actions: * If pain score is 4 or above: No action needed, pain <4. 1. Have you developed a fever since your procedure? no  2.   Have you had an respiratory symptoms (SOB or cough) since your procedure? no  3.   Have you tested positive for COVID 19 since your procedure no  4.   Have you had any family members/close contacts diagnosed with the COVID 19 since your procedure?  no   If yes to any of these questions please route to Joylene John, RN and Alphonsa Gin, Therapist, sports.

## 2019-03-15 ENCOUNTER — Other Ambulatory Visit: Payer: Self-pay

## 2019-03-15 DIAGNOSIS — E876 Hypokalemia: Secondary | ICD-10-CM

## 2019-03-19 ENCOUNTER — Encounter: Payer: Self-pay | Admitting: Internal Medicine

## 2019-03-19 ENCOUNTER — Other Ambulatory Visit: Payer: Self-pay

## 2019-03-19 ENCOUNTER — Other Ambulatory Visit: Payer: Medicare Other | Admitting: *Deleted

## 2019-03-28 ENCOUNTER — Ambulatory Visit: Payer: Medicare Other | Attending: Internal Medicine

## 2019-03-28 DIAGNOSIS — Z23 Encounter for immunization: Secondary | ICD-10-CM | POA: Insufficient documentation

## 2019-03-28 NOTE — Progress Notes (Signed)
   Covid-19 Vaccination Clinic  Name:  Hollynn Okafor    MRN: [id number] DOB: [birth year]  03/28/2019  Ms. Barson was observed post Covid-19 immunization for 15 minutes without incidence. She was provided with Vaccine Information Sheet and instruction to access the V-Safe system.   Ms. Rochell was instructed to call 911 with any severe reactions post vaccine: Marland Kitchen Difficulty breathing  . Swelling of your face and throat  . A fast heartbeat  . A bad rash all over your body  . Dizziness and weakness    Immunizations Administered    Name Date Dose VIS Date Route   Moderna COVID-19 Vaccine 03/28/2019  1:23 PM 0.5 mL 01/05/2019 Intramuscular   Manufacturer: Moderna   Lot: [id number]   [place]:[id number]

## 2019-04-07 ENCOUNTER — Ambulatory Visit: Payer: Medicare Other | Admitting: Physician Assistant

## 2019-04-20 ENCOUNTER — Ambulatory Visit: Payer: Medicare Other

## 2019-04-21 ENCOUNTER — Encounter: Payer: Self-pay | Admitting: Internal Medicine

## 2019-04-21 ENCOUNTER — Ambulatory Visit (INDEPENDENT_AMBULATORY_CARE_PROVIDER_SITE_OTHER): Payer: Medicare Other | Admitting: Internal Medicine

## 2019-04-21 ENCOUNTER — Other Ambulatory Visit: Payer: Self-pay

## 2019-04-21 VITALS — BP 139/85 | HR 66 | Temp 98.4°F | Wt 224.5 lb

## 2019-04-21 DIAGNOSIS — B351 Tinea unguium: Secondary | ICD-10-CM

## 2019-04-21 DIAGNOSIS — R8271 Bacteriuria: Secondary | ICD-10-CM | POA: Diagnosis not present

## 2019-04-21 DIAGNOSIS — R3129 Other microscopic hematuria: Secondary | ICD-10-CM

## 2019-04-21 NOTE — Progress Notes (Signed)
CC: Onychomycosis, Micro hemaglobinuria  HPI:  [name] is a 74 y.o. F with PMHx listed below presenting for Onychomycosis, Micro hemaglobinuria. Please see the A&P for the status of the patient's chronic medical problems.  Past Medical History:  Diagnosis Date  . Back pain    reason unknown  . CAD (coronary artery disease)    a. 07/2014: DES to OM1  b. 02/2015: CTO to RCA   . Cervical lymphadenopathy   . Cervical polyp   . Coronary artery disease    Prior stent of the lCX with chronic total occlusion of a small RCA. Last cath in 2004 showing patent stent, nonobstructive disease and total RCA. EF is 65 to 70%. She has been managed medically.   . Depression    sees a counselor at [facility] but no meds  . Diabetes mellitus without complication ([place])    takes Metformin daily  . History of colon polyps   . Hyperlipidemia    takes Crestor daily  . Hypertension    takes Amlodipine,Coreg,and Prinzide daily  . Hypothyroidism    takes Synthroid daily  . Mild obstructive sleep apnea 10/16/2012   03/12/2006: Moderate Obstructive sleep apnea with a Respiratory Disturbance Index of 29 events per hour.  05/26/13 Mild obstructive sleep apnea/hypopnea syndrome, AHI 7.5 per hour with events in all positions, especially supine. REM AHI 25.9 per hour. Moderately loud snoring with oxygen desaturation to a nadir of 82% and mean oxygen saturation through the study of 92.3% on room air.  07/31/18: Sleep s  . Neck mass   . OSA (obstructive sleep apnea)    PT DENIES, STATES SHE WAS TOLD SHE DIDN'T HAVE OSA, NO CPAP  . PTSD (post-traumatic stress disorder)   . Renal artery stenosis ([place]) 2007  . Stroke ([place])    TIA- LAST ONE GREATER THAN 10 YEARS PER PT  . TIA (transient ischemic attack)   . Tubular adenoma of rectum 10/24/04  . Umbilical hernia   . Urinary tract infection    Review of Systems:  Performed and all others negative.  Physical Exam:  Vitals:   04/21/19 1309 04/21/19 1316    BP: (!) 152/75 139/85  Pulse: 65 66  Temp: 98.4 F (36.9 C)   TempSrc: Oral   SpO2: 100%   Weight: 224 lb 8 oz (101.8 kg)    Physical Exam Constitutional:      General: She is not in acute distress.    Appearance: Normal appearance.  Cardiovascular:     Rate and Rhythm: Normal rate and regular rhythm.     Pulses: Normal pulses.     Heart sounds: Normal heart sounds.  Pulmonary:     Effort: Pulmonary effort is normal. No respiratory distress.     Breath sounds: Normal breath sounds.  Abdominal:     General: Bowel sounds are normal. There is no distension.     Palpations: Abdomen is soft.     Tenderness: There is no abdominal tenderness.  Musculoskeletal:        General: No swelling or deformity.  Skin:    General: Skin is warm and dry.     Comments: Onychomycosis with irregular nail thickening of bilateral great toe-nails. Some friable tissue at edge with trace blood (no active bleeding.  Neurological:     General: No focal deficit present.     Mental Status: Mental status is at baseline.    Assessment & Plan:   See Encounters Tab for problem based charting.  Patient discussed with Dr. Lynnae January

## 2019-04-21 NOTE — Assessment & Plan Note (Signed)
Patient with onychomycosis and some minimal bleeding from her toe. Has a history of ingrown toenail require removal in 2018. Will refer back to [facility] for treatment on onychomycosis and consideration of nail removal. No fevers, nor purulence, no pain at this time. Photos in chart for reference. - Referral to Podiatry

## 2019-04-21 NOTE — Patient Instructions (Addendum)
Thank you for allowing [place] to care for you  For your toe nail issues - We will refer to podiatry (foor doctor) to re-evaluate and treat these issues  We we check urine labs today, you will be contacted with the results  Follow up with Dr Heber [place] in April as scheduled

## 2019-04-21 NOTE — Assessment & Plan Note (Addendum)
Patient noted to have this history on chart review. Previously, a CT was recommended in U/A continued to show Hgb, this was not done. Multiple previous episode of hemoglobinuria were in the setting of UTI (WBC present). Will recheck U/A to day and workup further if Hematuria is still present. - U/A  ADDENDUM: U/A showed continued hematuria with asymptomatic bacteruria. Will order imaging to rule out mass. Patient will be called and informed of plan. - CT Urogram (CT w and w/o contrast)

## 2019-04-22 LAB — URINALYSIS, ROUTINE W REFLEX MICROSCOPIC
Bilirubin, UA: NEGATIVE
Glucose, UA: NEGATIVE
Ketones, UA: NEGATIVE
Nitrite, UA: NEGATIVE
Protein,UA: NEGATIVE
Specific Gravity, UA: 1.007 (ref 1.005–1.030)
Urobilinogen, Ur: 0.2 mg/dL (ref 0.2–1.0)
pH, UA: 6.5 (ref 5.0–7.5)

## 2019-04-22 LAB — MICROSCOPIC EXAMINATION: Casts: NONE SEEN /lpf

## 2019-04-22 NOTE — Addendum Note (Signed)
Addended by: Larey Dresser A on: 04/22/2019 08:30 AM   Modules accepted: Level of Service

## 2019-04-22 NOTE — Progress Notes (Signed)
Internal Medicine Clinic Attending  Case discussed with Dr. Melvin  at the time of the visit.  We reviewed the resident's history and exam and pertinent patient test results.  I agree with the assessment, diagnosis, and plan of care documented in the resident's note.  

## 2019-04-26 NOTE — Addendum Note (Signed)
Addended by: Neva Seat B on: 04/26/2019 01:53 PM   Modules accepted: Orders

## 2019-05-03 ENCOUNTER — Other Ambulatory Visit: Payer: Self-pay

## 2019-05-03 ENCOUNTER — Encounter: Payer: Self-pay | Admitting: Podiatry

## 2019-05-03 ENCOUNTER — Ambulatory Visit (INDEPENDENT_AMBULATORY_CARE_PROVIDER_SITE_OTHER): Payer: Medicare Other | Admitting: Podiatry

## 2019-05-03 VITALS — Temp 97.1°F

## 2019-05-03 DIAGNOSIS — L600 Ingrowing nail: Secondary | ICD-10-CM

## 2019-05-03 MED ORDER — NEOMYCIN-POLYMYXIN-HC 3.5-10000-1 OT SOLN: OTIC | 0 refills | Status: AC

## 2019-05-03 NOTE — Patient Instructions (Signed)

## 2019-05-05 ENCOUNTER — Ambulatory Visit (HOSPITAL_COMMUNITY)
Admission: RE | Admit: 2019-05-05 | Discharge: 2019-05-05 | Disposition: A | Discharge status: Home / Self Care | Payer: Medicare Other | Source: Ambulatory Visit | Attending: Internal Medicine | Admitting: Internal Medicine

## 2019-05-05 ENCOUNTER — Other Ambulatory Visit: Payer: Self-pay

## 2019-05-05 DIAGNOSIS — R3129 Other microscopic hematuria: Secondary | ICD-10-CM | POA: Insufficient documentation

## 2019-05-05 DIAGNOSIS — N281 Cyst of kidney, acquired: Secondary | ICD-10-CM | POA: Diagnosis not present

## 2019-05-05 LAB — POCT I-STAT CREATININE: Creatinine, Ser: 1 mg/dL (ref 0.44–1.00)

## 2019-05-05 MED ORDER — IOHEXOL 300 MG/ML  SOLN
100.0000 mL | Freq: Once | INTRAMUSCULAR | Status: AC | PRN
  Administered 2019-05-05: 100 mL via INTRAVENOUS

## 2019-05-05 NOTE — Progress Notes (Signed)
Subjective:   [name], female   DOB: 74 y.o.   MRN: [id number]   HPI Patient presents stating that her hallux nails have developed some thickness on the and while it is improved from the procedure we did several years ago they are bothersome for her.  Patient's not been seen for approximately 3 years and no change in health history and does not smoke likes to be active   ROS      Objective:  Physical Exam  Neuro vascular status intact with patient found to have damaged hallux nails bilateral with previous repair done that has taken care of the corners but the central part of the nails are damaged painful with no erythema edema or active drainage noted     Assessment:  Chronic nail disease hallux bilateral which has improved with previous procedure but giving her problems now     Plan:  H&P reviewed condition and I do think at this point removal of the nails would be best and this is what she wants.  We will do this permanently and I allowed her to read consent form going over risk and all other complications and after reading she signed it in today infiltrated each hallux 60 mg like Marcaine mixture sterile prep applied and using sterile instrumentation removed hallux nail exposed matrix and applied phenol to the base 5 applications 30 seconds followed by alcohol lavage sterile dressing.  Gave instructions on soaks and to leave dressings on 24 hours but take them off earlier if throbbing were to occur wrote prescription for drops to use postoperatively and encouraged her to call with questions concerns which may arise

## 2019-05-13 ENCOUNTER — Encounter: Payer: Self-pay | Admitting: Internal Medicine

## 2019-05-13 ENCOUNTER — Ambulatory Visit (INDEPENDENT_AMBULATORY_CARE_PROVIDER_SITE_OTHER): Payer: Medicare Other | Admitting: Internal Medicine

## 2019-05-13 ENCOUNTER — Other Ambulatory Visit: Payer: Self-pay

## 2019-05-13 ENCOUNTER — Ambulatory Visit: Payer: Self-pay | Admitting: Podiatry

## 2019-05-13 VITALS — BP 107/63 | HR 74 | Temp 98.1°F | Ht 64.0 in | Wt 217.3 lb

## 2019-05-13 DIAGNOSIS — J302 Other seasonal allergic rhinitis: Secondary | ICD-10-CM

## 2019-05-13 DIAGNOSIS — E1151 Type 2 diabetes mellitus with diabetic peripheral angiopathy without gangrene: Secondary | ICD-10-CM

## 2019-05-13 DIAGNOSIS — L918 Other hypertrophic disorders of the skin: Secondary | ICD-10-CM | POA: Diagnosis not present

## 2019-05-13 DIAGNOSIS — J309 Allergic rhinitis, unspecified: Secondary | ICD-10-CM | POA: Diagnosis not present

## 2019-05-13 DIAGNOSIS — Z79899 Other long term (current) drug therapy: Secondary | ICD-10-CM

## 2019-05-13 DIAGNOSIS — Z7982 Long term (current) use of aspirin: Secondary | ICD-10-CM

## 2019-05-13 DIAGNOSIS — I10 Essential (primary) hypertension: Secondary | ICD-10-CM | POA: Diagnosis not present

## 2019-05-13 DIAGNOSIS — Z7984 Long term (current) use of oral hypoglycemic drugs: Secondary | ICD-10-CM

## 2019-05-13 DIAGNOSIS — Z6837 Body mass index (BMI) 37.0-37.9, adult: Secondary | ICD-10-CM

## 2019-05-13 LAB — POCT GLYCOSYLATED HEMOGLOBIN (HGB A1C): Hemoglobin A1C: 7 % — AB (ref 4.0–5.6)

## 2019-05-13 LAB — GLUCOSE, CAPILLARY: Glucose-Capillary: 142 mg/dL — ABNORMAL HIGH (ref 70–99)

## 2019-05-13 NOTE — Progress Notes (Addendum)
  Subjective:  HPI: [name] is a 74 y.o. female who presents for f/u DM and HTN  Please see Assessment and Plan below for the status of her chronic medical problems.  Review of Systems: Review of Systems  Constitutional: Negative for fever and malaise/fatigue.  Respiratory: Negative for cough and shortness of breath.   Cardiovascular: Negative for chest pain.  Genitourinary: Negative for dysuria.  Neurological: Negative for dizziness.  Endo/Heme/Allergies: Negative for polydipsia.    Objective:  Physical Exam: Vitals:   05/13/19 1039  BP: 107/63  Pulse: 74  Temp: 98.1 F (36.7 C)  TempSrc: Oral  SpO2: 100%  Weight: 217 lb 4.8 oz (98.6 kg)  Height: 5\' 4"  (1.626 m)   Body mass index is 37.3 kg/m. Physical Exam Vitals and nursing note reviewed.  Constitutional:      Appearance: Normal appearance.  Cardiovascular:     Rate and Rhythm: Normal rate and regular rhythm.     Pulses:          Dorsalis pedis pulses are 2+ on the right side and 2+ on the left side.       Posterior tibial pulses are 2+ on the right side and 2+ on the left side.  Pulmonary:     Effort: Pulmonary effort is normal.     Breath sounds: Normal breath sounds. No wheezing.  Neurological:     Mental Status: She is alert.    Assessment & Plan:  See Encounters Tab for problem based charting.  Medications Ordered No orders of the defined types were placed in this encounter.  Other Orders Orders Placed This Encounter  Procedures  . Glucose, capillary  . POC Hbg A1C   Follow Up: Return in about 3 months (around 08/12/2019).  Procedure: Skin tag removal Informed consent:  Discussed risks (permanent scarring, infection, pain, bleeding, bruising, redness, and recurrence of the lesion) and benefits of the procedure, as well as the alternatives.  She is aware that skin tags are benign lesions, and their removal is often not considered medically necessary.  Informed consent was obtained.  Cryotherapy was utilized. The patient tolerated procedure well. The patient was instructed on post-op care.   Number of lesions removed:  15 Patient instructed skin will blister after about 12-24 hours, may place vasaline to area while it heals.

## 2019-05-13 NOTE — Assessment & Plan Note (Signed)
HPI: As noted before she has been exercising little bit more and has lost 7 pounds.  Continues to take Metformin 500 mg twice daily.  Assessment controlled type 2 diabetes with peripheral vascular disease  Plan Continue Metformin 500 milligrams twice daily Continue atorvastatin 80 mg daily Continue aspirin 81 mg daily

## 2019-05-13 NOTE — Assessment & Plan Note (Signed)
Congratulated on 7 pounds weight loss she will continue to work on weight loss.

## 2019-05-13 NOTE — Patient Instructions (Signed)
You can pick up Early for the itchy eyes due to pollen.

## 2019-05-13 NOTE — Assessment & Plan Note (Signed)
She reports her symptoms this year are more with eye itching than her usual rhinitis I suspect this is due to the use of a facemask.  I discussed that she may try Zaditor over-the-counter

## 2019-05-13 NOTE — Assessment & Plan Note (Signed)
HPI: Patient reports she has been walking more recently has lost 7 pounds.  Has been taken off her antihypertensive medications as directed.  Blood pressure at home has been running and 1 10-1 20 range she has no orthostatic symptoms.  She thinks that her blood pressure may have improved after retiring.  She drinks 1 cup of coffee a day  Assessment well-controlled essential hypertension  Plan Historically her blood pressure has been somewhat difficult to control is on the lower side today but she is asymptomatic.  I discussed with continued exercise and weight loss we may start to decrease her antihypertensive therapy.  Continue valsartan HCTZ 3 20-25 daily  Continue spironolactone 25 mg daily  Continue amlodipine 5 mg daily

## 2019-05-19 ENCOUNTER — Other Ambulatory Visit: Payer: Self-pay | Admitting: *Deleted

## 2019-05-19 DIAGNOSIS — E1151 Type 2 diabetes mellitus with diabetic peripheral angiopathy without gangrene: Secondary | ICD-10-CM

## 2019-05-19 MED ORDER — METFORMIN HCL ER 500 MG PO TB24: ORAL_TABLET | ORAL | 1 refills | Status: DC

## 2019-05-24 ENCOUNTER — Other Ambulatory Visit: Payer: Self-pay | Admitting: Internal Medicine

## 2019-05-24 DIAGNOSIS — Z1231 Encounter for screening mammogram for malignant neoplasm of breast: Secondary | ICD-10-CM

## 2019-05-25 ENCOUNTER — Other Ambulatory Visit: Payer: Self-pay

## 2019-05-25 ENCOUNTER — Ambulatory Visit
Admission: RE | Admit: 2019-05-25 | Discharge: 2019-05-25 | Disposition: A | Discharge status: Home / Self Care | Payer: Medicare Other | Source: Ambulatory Visit | Attending: Internal Medicine | Admitting: Internal Medicine

## 2019-05-25 DIAGNOSIS — Z1231 Encounter for screening mammogram for malignant neoplasm of breast: Secondary | ICD-10-CM

## 2019-05-26 ENCOUNTER — Other Ambulatory Visit: Payer: Self-pay | Admitting: Internal Medicine

## 2019-05-26 DIAGNOSIS — R928 Other abnormal and inconclusive findings on diagnostic imaging of breast: Secondary | ICD-10-CM

## 2019-06-07 ENCOUNTER — Ambulatory Visit: Payer: Medicare Other

## 2019-06-07 ENCOUNTER — Ambulatory Visit
Admission: RE | Admit: 2019-06-07 | Discharge: 2019-06-07 | Disposition: A | Discharge status: Home / Self Care | Payer: Medicare Other | Source: Ambulatory Visit | Attending: Internal Medicine | Admitting: Internal Medicine

## 2019-06-07 ENCOUNTER — Other Ambulatory Visit: Payer: Self-pay

## 2019-06-07 DIAGNOSIS — R928 Other abnormal and inconclusive findings on diagnostic imaging of breast: Secondary | ICD-10-CM

## 2019-06-07 DIAGNOSIS — N650 Deformity of reconstructed breast: Secondary | ICD-10-CM | POA: Diagnosis not present

## 2019-08-16 LAB — HM DIABETES EYE EXAM

## 2019-08-23 ENCOUNTER — Encounter: Payer: Self-pay | Admitting: *Deleted

## 2019-09-09 ENCOUNTER — Other Ambulatory Visit: Payer: Self-pay | Admitting: *Deleted

## 2019-09-09 DIAGNOSIS — E038 Other specified hypothyroidism: Secondary | ICD-10-CM

## 2019-09-09 MED ORDER — LEVOTHYROXINE SODIUM 100 MCG PO TABS: 100.0000 ug | ORAL_TABLET | Freq: Every day | ORAL | 3 refills | Status: DC

## 2019-11-05 ENCOUNTER — Encounter: Payer: Self-pay | Admitting: Internal Medicine

## 2019-11-05 ENCOUNTER — Telehealth: Payer: Self-pay

## 2019-11-05 ENCOUNTER — Ambulatory Visit (INDEPENDENT_AMBULATORY_CARE_PROVIDER_SITE_OTHER): Payer: Medicare Other | Admitting: Internal Medicine

## 2019-11-05 ENCOUNTER — Other Ambulatory Visit: Payer: Self-pay

## 2019-11-05 VITALS — BP 115/63 | HR 81 | Temp 98.6°F | Ht 64.0 in | Wt 206.7 lb

## 2019-11-05 DIAGNOSIS — I10 Essential (primary) hypertension: Secondary | ICD-10-CM | POA: Diagnosis not present

## 2019-11-05 DIAGNOSIS — E1151 Type 2 diabetes mellitus with diabetic peripheral angiopathy without gangrene: Secondary | ICD-10-CM | POA: Diagnosis not present

## 2019-11-05 DIAGNOSIS — I959 Hypotension, unspecified: Secondary | ICD-10-CM

## 2019-11-05 LAB — POCT GLYCOSYLATED HEMOGLOBIN (HGB A1C): Hemoglobin A1C: 6.8 % — AB (ref 4.0–5.6)

## 2019-11-05 LAB — GLUCOSE, CAPILLARY
Glucose-Capillary: 144 mg/dL — ABNORMAL HIGH (ref 70–99)
Glucose-Capillary: 130 mg/dL — ABNORMAL HIGH (ref 70–99)

## 2019-11-05 MED ORDER — ISOSORBIDE MONONITRATE ER 60 MG PO TB24: 30.0000 mg | ORAL_TABLET | Freq: Every day | ORAL | 3 refills | Status: DC

## 2019-11-05 NOTE — Telephone Encounter (Signed)
Received TC from patient who is complaining that her b/p's have been running low, she reports 2 b/p's of 97/55, 90/50.  She c/o of feeling light headed at times and is unsure if it is related to position changes.  She states her symptoms have been going on for about 2 weeks. Appt given for today @ 3:45 w/ Dr. Coe/red team. Laurence Compton, RN,BSN

## 2019-11-05 NOTE — Patient Instructions (Addendum)
Thank you, [name] for allowing [place] to provide your care today. Today we discussed Dizziness.    I have ordered the following labs for you: BMP, Hgb A1c, Mg    Lab Orders     Glucose, capillary   I will call if any are abnormal. All of your labs can be accessed through "My Chart".  I have place a referrals to none.  I have ordered the following tests: EKG    I have ordered the following medication/changed the following medications:  1. discontinue Spironolactone 2. decreasing Imdur to 30 mg daily (half tablet)  Please follow-up on monday.  Should you have any questions or concerns please call the internal medicine clinic at [phone].    Marianna Payment, D.O. [facility]   My Chart Access: [email or web address]   If you have not already done so, please get your COVID 19 vaccine  To schedule an appointment for a COVID vaccine choice any of the following: Go to [email or web address]   Go to [email or web address]                  Call [phone]                                     Call [phone] and select Option 2   Please call our clinic if you have any questions or concerns, we may be able to help and keep you from a long and expensive emergency room wait. Our clinic and after hours phone number is [phone], the best time to call is Monday through Friday 9 am to 4 pm but there is always someone available 24/7 if you have an emergency. If you need medication refills please notify your pharmacy one week in advance and they will send [place] a request.

## 2019-11-05 NOTE — Progress Notes (Signed)
CC: Hypotension   HPI:  [name] is a 74 y.o. female with a past medical history stated below and presents today for hypotension. Please see problem based assessment and plan for additional details.  Past Medical History:  Diagnosis Date  . Back pain    reason unknown  . CAD (coronary artery disease)    a. 07/2014: DES to OM1  b. 02/2015: CTO to RCA   . Cervical lymphadenopathy   . Cervical polyp   . Coronary artery disease    Prior stent of the lCX with chronic total occlusion of a small RCA. Last cath in 2004 showing patent stent, nonobstructive disease and total RCA. EF is 65 to 70%. She has been managed medically.   . Depression    sees a counselor at [facility] but no meds  . Diabetes mellitus without complication ([place])    takes Metformin daily  . History of colon polyps   . Hyperlipidemia    takes Crestor daily  . Hypertension    takes Amlodipine,Coreg,and Prinzide daily  . Hypothyroidism    takes Synthroid daily  . Mild obstructive sleep apnea 10/16/2012   03/12/2006: Moderate Obstructive sleep apnea with a Respiratory Disturbance Index of 29 events per hour.  05/26/13 Mild obstructive sleep apnea/hypopnea syndrome, AHI 7.5 per hour with events in all positions, especially supine. REM AHI 25.9 per hour. Moderately loud snoring with oxygen desaturation to a nadir of 82% and mean oxygen saturation through the study of 92.3% on room air.  07/31/18: Sleep s  . Neck mass   . OSA (obstructive sleep apnea)    PT DENIES, STATES SHE WAS TOLD SHE DIDN'T HAVE OSA, NO CPAP  . PTSD (post-traumatic stress disorder)   . Renal artery stenosis ([place]) 2007  . Stroke ([place])    TIA- LAST ONE GREATER THAN 10 YEARS PER PT  . TIA (transient ischemic attack)   . Tubular adenoma of rectum 10/24/04  . Umbilical hernia   . Urinary tract infection     Current Outpatient Medications on File Prior to Visit  Medication Sig Dispense Refill  . amLODipine (NORVASC) 5 MG tablet Take 1  tablet (5 mg total) by mouth daily. 90 tablet 3  . aspirin 81 MG tablet Take 1 tablet (81 mg total) by mouth daily.    Marland Kitchen atorvastatin (LIPITOR) 80 MG tablet Take 1 tablet (80 mg total) by mouth daily. 90 tablet 3  . carvedilol (COREG) 12.5 MG tablet Take 1 tablet (12.5 mg total) by mouth 2 (two) times daily with a meal. 180 tablet 3  . clopidogrel (PLAVIX) 75 MG tablet Take 1 tablet by mouth once daily 90 tablet 2  . EQ ALLERGY RELIEF 10 MG tablet TAKE 1 TABLET BY MOUTH ONCE DAILY 90 tablet 3  . fluticasone (FLONASE) 50 MCG/ACT nasal spray Place 1 spray into both nostrils daily. 16 g 2  . levothyroxine (SYNTHROID) 100 MCG tablet Take 1 tablet (100 mcg total) by mouth daily before breakfast. 90 tablet 3  . metFORMIN (GLUCOPHAGE-XR) 500 MG 24 hr tablet TAKE TWO TABLETS BY MOUTH ONCE DAILY WITH  BREAKFAST 180 tablet 1  . neomycin-polymyxin-hydrocortisone (CORTISPORIN) OTIC solution Apply 1-2 drops to toe after soaking twice a day 10 mL 0  . nitroGLYCERIN (NITROSTAT) 0.4 MG SL tablet DISSOLVE ONE TABLET UNDER THE TONGUE EVERY 5 MINUTES AS NEEDED FOR CHEST PAIN.  DO NOT EXCEED A TOTAL OF 3 DOSES IN 15 MINUTES 25 tablet 0  . potassium chloride  SA (KLOR-CON) 20 MEQ tablet Take 1 tablet (20 mEq total) by mouth daily. 90 tablet 0  . valsartan-hydrochlorothiazide (DIOVAN-HCT) 320-25 MG tablet Take 1 tablet by mouth daily. 90 tablet 3   Current Facility-Administered Medications on File Prior to Visit  Medication Dose Route Frequency Provider Last Rate Last Admin  . 0.9 %  sodium chloride infusion  500 mL Intravenous Once Gatha Mayer, MD        Family History  Problem Relation Age of Onset  . Hypertension Mother   . Osteoarthritis Mother   . Alzheimer's disease Father   . Heart attack Maternal Grandfather   . Cancer Neg Hx   . Stroke Neg Hx   . Colon cancer Neg Hx   . Esophageal cancer Neg Hx   . Stomach cancer Neg Hx   . Rectal cancer Neg Hx     Social History   Socioeconomic History  .  Marital status: Single    Spouse name: Not on file  . Number of children: Not on file  . Years of education: Not on file  . Highest education level: Not on file  Occupational History  . Not on file  Tobacco Use  . Smoking status: Former Research scientist (life sciences)  . Smokeless tobacco: Never Used  . Tobacco comment: quit smoking about 30 yrs ago  Vaping Use  . Vaping Use: Never used  Substance and Sexual Activity  . Alcohol use: Yes    Alcohol/week: 0.0 standard drinks    Comment: Wine on special occasions  . Drug use: No  . Sexual activity: Not on file  Other Topics Concern  . Not on file  Social History Narrative  . Not on file   Social Determinants of Health   Financial Resource Strain:   . Difficulty of Paying Living Expenses: Not on file  Food Insecurity:   . Worried About Charity fundraiser in the Last Year: Not on file  . Ran Out of Food in the Last Year: Not on file  Transportation Needs:   . Lack of Transportation (Medical): Not on file  . Lack of Transportation (Non-Medical): Not on file  Physical Activity:   . Days of Exercise per Week: Not on file  . Minutes of Exercise per Session: Not on file  Stress:   . Feeling of Stress : Not on file  Social Connections:   . Frequency of Communication with Friends and Family: Not on file  . Frequency of Social Gatherings with Friends and Family: Not on file  . Attends Religious Services: Not on file  . Active Member of Clubs or Organizations: Not on file  . Attends Archivist Meetings: Not on file  . Marital Status: Not on file  Intimate Partner Violence:   . Fear of Current or Ex-Partner: Not on file  . Emotionally Abused: Not on file  . Physically Abused: Not on file  . Sexually Abused: Not on file    Review of Systems: ROS negative except for what is noted on the assessment and plan.  Vitals:   11/05/19 1535 11/05/19 1540 11/05/19 1544 11/05/19 1547  BP: 116/62 (!) 111/56 (!) 108/55 115/63  Pulse: 78 71 79 81    Temp: 98.6 F (37 C)     TempSrc: Oral     SpO2: 100%     Weight: 206 lb 11.2 oz (93.8 kg)     Height: 5\' 4"  (1.626 m)        Physical Exam: Physical Exam  Constitutional:      Appearance: Normal appearance.  HENT:     Head: Normocephalic and atraumatic.  Eyes:     Extraocular Movements: Extraocular movements intact.  Neck:     Vascular: No JVD.  Cardiovascular:     Rate and Rhythm: Normal rate and regular rhythm.     Pulses: Normal pulses.     Heart sounds: Normal heart sounds.  Pulmonary:     Effort: Pulmonary effort is normal.     Breath sounds: Normal breath sounds. No wheezing, rhonchi or rales.  Chest:     Chest wall: No tenderness.  Abdominal:     General: Bowel sounds are normal. There is no distension.     Palpations: Abdomen is soft.     Tenderness: There is no abdominal tenderness.  Musculoskeletal:        General: No swelling. Normal range of motion.     Cervical back: Normal range of motion.  Skin:    General: Skin is warm and dry.  Neurological:     General: No focal deficit present.     Mental Status: She is alert and oriented to person, place, and time. Mental status is at baseline.      Assessment & Plan:   See Encounters Tab for problem based charting.  Patient discussed with Dr. Hilbert Odor, D.O. [facility], PGY-2 Pager: [phone], Phone: [phone] Date 11/06/2019 Time 9:03 AM

## 2019-11-05 NOTE — Telephone Encounter (Signed)
Sounds good I see her on the list. Thank you!

## 2019-11-06 LAB — BMP8+ANION GAP
Anion Gap: 13 mmol/L (ref 10.0–18.0)
BUN/Creatinine Ratio: 11 — ABNORMAL LOW (ref 12–28)
BUN: 10 mg/dL (ref 8–27)
CO2: 28 mmol/L (ref 20–29)
Calcium: 9.2 mg/dL (ref 8.7–10.3)
Chloride: 101 mmol/L (ref 96–106)
Creatinine, Ser: 0.87 mg/dL (ref 0.57–1.00)
GFR calc Af Amer: 77 mL/min/{1.73_m2} (ref >59)
GFR calc non Af Amer: 67 mL/min/{1.73_m2} (ref >59)
Glucose: 127 mg/dL — ABNORMAL HIGH (ref 65–99)
Potassium: 3.4 mmol/L — ABNORMAL LOW (ref 3.5–5.2)
Sodium: 142 mmol/L (ref 134–144)

## 2019-11-06 LAB — MAGNESIUM: Magnesium: 1.5 mg/dL — ABNORMAL LOW (ref 1.6–2.3)

## 2019-11-07 ENCOUNTER — Encounter: Payer: Self-pay | Admitting: Internal Medicine

## 2019-11-07 NOTE — Assessment & Plan Note (Addendum)
Patient presents to the clinic with a chief concern of hypotension states that she has been taking her blood pressure lately and it is dropping to around 95/55. She states that her BP is normally around 130/80. She has associated dizziness, without nausea . She denies vinson changes or focal/generalized weakness but has felt tired and run down lately. She feels like her symptoms are worsen when changing positions. She denies chest pain, decreased PO intake, changes in medications, lower extremity edema, orthopnea, or PND. She does admit to palpitations and SHOB with exertion/decreased exercise tolerance.   She admits to an episode of palpitations yesterday when walking to the mailbox. She had associated dizziness, shortness of breath, and nausea. She has history of angina and took some nitroglycerine which help her symptoms improve.   Currently she denies any chest pain or shortness of breath. She does not have an JVD or LE edema. Heart sounds are distant but otherwise unremarkable. Her lungs are clear to ascultation bilaterally.   Her POC CBG and orthostatic vitals were WNL and her EKG shows mild worsening of her T wave inversion in the lateral leads.   Plan: - D/c spironolactone and decrease imdur to 30 mg daily - Will get a BMP, Mg, Hgb A1c today. - Patient is not currently having any symptoms concerning for ACS, but does have a history that is high risk for worsening of her coronary artery disease. I discussed very specific return precautions, including any additional episodes of chest pain, or shortness of breath.   - I will make a follow up appointment on Monday to re-evaluate her symptoms.

## 2019-11-08 ENCOUNTER — Ambulatory Visit (INDEPENDENT_AMBULATORY_CARE_PROVIDER_SITE_OTHER): Payer: Medicare Other | Admitting: Internal Medicine

## 2019-11-08 ENCOUNTER — Encounter: Payer: Self-pay | Admitting: Internal Medicine

## 2019-11-08 ENCOUNTER — Other Ambulatory Visit: Payer: Self-pay

## 2019-11-08 VITALS — BP 124/71 | HR 72 | Temp 98.4°F | Ht 64.0 in | Wt 205.6 lb

## 2019-11-08 DIAGNOSIS — I10 Essential (primary) hypertension: Secondary | ICD-10-CM | POA: Diagnosis not present

## 2019-11-08 NOTE — Patient Instructions (Signed)
Thank you, [name] for allowing [place] to provide your care today. Today we discussed Symptomatic hypotension.    I have ordered the following labs for you:  Lab Orders  No laboratory test(s) ordered today     I will call if any are abnormal. All of your labs can be accessed through "My Chart".  I have place a referrals to none, but I will call [place] to set up a follow up appointmetn with your cardiologist. .  I have ordered the following tests: none   I have ordered the following medication/changed the following medications:  1. Continue current medications.  Please follow-up in 3 months with Dr. Heber Pennington.  Should you have any questions or concerns please call the internal medicine clinic at [phone].    Marianna Payment, D.O. [facility]   My Chart Access: [email or web address]   If you have not already done so, please get your COVID 19 vaccine  To schedule an appointment for a COVID vaccine choice any of the following: Go to [email or web address]   Go to [email or web address]                  Call [phone]                                     Call [phone] and select Option 2

## 2019-11-08 NOTE — Progress Notes (Signed)
Internal Medicine Clinic Attending  Case discussed with Dr. Coe  At the time of the visit.  We reviewed the resident's history and exam and pertinent patient test results.  I agree with the assessment, diagnosis, and plan of care documented in the resident's note.  

## 2019-11-08 NOTE — Progress Notes (Signed)
CC: Hypotension follow-up  HPI:  [name] is a 74 y.o. female with a past medical history stated below and presents today for hypotension follow-up. Please see problem based assessment and plan for additional details.  Past Medical History:  Diagnosis Date  . Back pain    reason unknown  . CAD (coronary artery disease)    a. 07/2014: DES to OM1  b. 02/2015: CTO to RCA   . Cervical lymphadenopathy   . Cervical polyp   . Coronary artery disease    Prior stent of the lCX with chronic total occlusion of a small RCA. Last cath in 2004 showing patent stent, nonobstructive disease and total RCA. EF is 65 to 70%. She has been managed medically.   . Depression    sees a counselor at [facility] but no meds  . Diabetes mellitus without complication ([place])    takes Metformin daily  . History of colon polyps   . Hyperlipidemia    takes Crestor daily  . Hypertension    takes Amlodipine,Coreg,and Prinzide daily  . Hypothyroidism    takes Synthroid daily  . Mild obstructive sleep apnea 10/16/2012   03/12/2006: Moderate Obstructive sleep apnea with a Respiratory Disturbance Index of 29 events per hour.  05/26/13 Mild obstructive sleep apnea/hypopnea syndrome, AHI 7.5 per hour with events in all positions, especially supine. REM AHI 25.9 per hour. Moderately loud snoring with oxygen desaturation to a nadir of 82% and mean oxygen saturation through the study of 92.3% on room air.  07/31/18: Sleep s  . Neck mass   . OSA (obstructive sleep apnea)    PT DENIES, STATES SHE WAS TOLD SHE DIDN'T HAVE OSA, NO CPAP  . PTSD (post-traumatic stress disorder)   . Renal artery stenosis ([place]) 2007  . Stroke ([place])    TIA- LAST ONE GREATER THAN 10 YEARS PER PT  . TIA (transient ischemic attack)   . Tubular adenoma of rectum 10/24/04  . Umbilical hernia   . Urinary tract infection     Current Outpatient Medications on File Prior to Visit  Medication Sig Dispense Refill  . amLODipine (NORVASC) 5 MG  tablet Take 1 tablet (5 mg total) by mouth daily. 90 tablet 3  . aspirin 81 MG tablet Take 1 tablet (81 mg total) by mouth daily.    Marland Kitchen atorvastatin (LIPITOR) 80 MG tablet Take 1 tablet (80 mg total) by mouth daily. 90 tablet 3  . carvedilol (COREG) 12.5 MG tablet Take 1 tablet (12.5 mg total) by mouth 2 (two) times daily with a meal. 180 tablet 3  . clopidogrel (PLAVIX) 75 MG tablet Take 1 tablet by mouth once daily 90 tablet 2  . EQ ALLERGY RELIEF 10 MG tablet TAKE 1 TABLET BY MOUTH ONCE DAILY 90 tablet 3  . fluticasone (FLONASE) 50 MCG/ACT nasal spray Place 1 spray into both nostrils daily. 16 g 2  . isosorbide mononitrate (IMDUR) 60 MG 24 hr tablet Take 0.5 tablets (30 mg total) by mouth daily. 45 tablet 3  . levothyroxine (SYNTHROID) 100 MCG tablet Take 1 tablet (100 mcg total) by mouth daily before breakfast. 90 tablet 3  . metFORMIN (GLUCOPHAGE-XR) 500 MG 24 hr tablet TAKE TWO TABLETS BY MOUTH ONCE DAILY WITH  BREAKFAST 180 tablet 1  . neomycin-polymyxin-hydrocortisone (CORTISPORIN) OTIC solution Apply 1-2 drops to toe after soaking twice a day 10 mL 0  . nitroGLYCERIN (NITROSTAT) 0.4 MG SL tablet DISSOLVE ONE TABLET UNDER THE TONGUE EVERY 5 MINUTES AS NEEDED  FOR CHEST PAIN.  DO NOT EXCEED A TOTAL OF 3 DOSES IN 15 MINUTES 25 tablet 0  . potassium chloride SA (KLOR-CON) 20 MEQ tablet Take 1 tablet (20 mEq total) by mouth daily. 90 tablet 0  . valsartan-hydrochlorothiazide (DIOVAN-HCT) 320-25 MG tablet Take 1 tablet by mouth daily. 90 tablet 3   Current Facility-Administered Medications on File Prior to Visit  Medication Dose Route Frequency Provider Last Rate Last Admin  . 0.9 %  sodium chloride infusion  500 mL Intravenous Once Gatha Mayer, MD        Family History  Problem Relation Age of Onset  . Hypertension Mother   . Osteoarthritis Mother   . Alzheimer's disease Father   . Heart attack Maternal Grandfather   . Cancer Neg Hx   . Stroke Neg Hx   . Colon cancer Neg Hx   .  Esophageal cancer Neg Hx   . Stomach cancer Neg Hx   . Rectal cancer Neg Hx     Social History   Socioeconomic History  . Marital status: Single    Spouse name: Not on file  . Number of children: Not on file  . Years of education: Not on file  . Highest education level: Not on file  Occupational History  . Not on file  Tobacco Use  . Smoking status: Former Research scientist (life sciences)  . Smokeless tobacco: Never Used  . Tobacco comment: quit smoking about 30 yrs ago  Vaping Use  . Vaping Use: Never used  Substance and Sexual Activity  . Alcohol use: Yes    Alcohol/week: 0.0 standard drinks    Comment: Wine on special occasions  . Drug use: No  . Sexual activity: Not on file  Other Topics Concern  . Not on file  Social History Narrative  . Not on file   Social Determinants of Health   Financial Resource Strain:   . Difficulty of Paying Living Expenses: Not on file  Food Insecurity:   . Worried About Charity fundraiser in the Last Year: Not on file  . Ran Out of Food in the Last Year: Not on file  Transportation Needs:   . Lack of Transportation (Medical): Not on file  . Lack of Transportation (Non-Medical): Not on file  Physical Activity:   . Days of Exercise per Week: Not on file  . Minutes of Exercise per Session: Not on file  Stress:   . Feeling of Stress : Not on file  Social Connections:   . Frequency of Communication with Friends and Family: Not on file  . Frequency of Social Gatherings with Friends and Family: Not on file  . Attends Religious Services: Not on file  . Active Member of Clubs or Organizations: Not on file  . Attends Archivist Meetings: Not on file  . Marital Status: Not on file  Intimate Partner Violence:   . Fear of Current or Ex-Partner: Not on file  . Emotionally Abused: Not on file  . Physically Abused: Not on file  . Sexually Abused: Not on file    Review of Systems: ROS negative except for what is noted on the assessment and  plan.  Vitals:   11/08/19 1515  BP: 124/71  Pulse: 72  Temp: 98.4 F (36.9 C)  TempSrc: Oral  SpO2: 99%  Weight: 205 lb 9.6 oz (93.3 kg)  Height: 5\' 4"  (1.626 m)     Physical Exam: Physical Exam Constitutional:      Appearance: Normal  appearance.  HENT:     Head: Normocephalic and atraumatic.  Eyes:     Extraocular Movements: Extraocular movements intact.  Cardiovascular:     Rate and Rhythm: Normal rate.     Pulses: Normal pulses.     Heart sounds: Normal heart sounds.  Pulmonary:     Effort: Pulmonary effort is normal.     Breath sounds: Normal breath sounds.  Abdominal:     General: Bowel sounds are normal.     Palpations: Abdomen is soft.     Tenderness: There is no abdominal tenderness.  Musculoskeletal:        General: Normal range of motion.     Cervical back: Normal range of motion.     Right lower leg: No edema.     Left lower leg: No edema.  Skin:    General: Skin is warm and dry.  Neurological:     Mental Status: She is alert and oriented to person, place, and time. Mental status is at baseline.  Psychiatric:        Mood and Affect: Mood normal.      Assessment & Plan:   See Encounters Tab for problem based charting.  Patient discussed with Dr. Bertell Maria, D.O. [facility], PGY-2 Pager: [phone], Phone: [phone] Date 11/09/2019 Time 7:34 AM

## 2019-11-09 ENCOUNTER — Encounter: Payer: Self-pay | Admitting: Internal Medicine

## 2019-11-09 NOTE — Assessment & Plan Note (Signed)
Patient presents for a follow-up for hypertension.  Patient's spironolactone was discontinued and Imdur was decreased to 30 mg daily.  Patient states that her dizziness and palpitations have resolved since changing these medications.  Her blood pressure today was 124/71 which is roughly normal for her.  Patient had minimal hypokalemia and hypomagnesemia on recent labs.  I counseled on the importance of dietary changes to ensure supplementation of these electrolytes.  Also counseled on the importance of a follow-up appointment with Dr. [facility] as she will likely need further surveillance for worsening ischemic heart disease.  Her last cardiac cath was in 2017.  Plan: - Continue changes to her antihypertensive medications -We will set patient up with a follow-up appointment with cardiology.

## 2019-11-11 NOTE — Progress Notes (Signed)
Internal Medicine Clinic Attending  Case discussed with Dr. Coe  At the time of the visit.  We reviewed the resident's history and exam and pertinent patient test results.  I agree with the assessment, diagnosis, and plan of care documented in the resident's note.  

## 2019-12-16 ENCOUNTER — Other Ambulatory Visit: Payer: Self-pay | Admitting: *Deleted

## 2019-12-16 DIAGNOSIS — E1151 Type 2 diabetes mellitus with diabetic peripheral angiopathy without gangrene: Secondary | ICD-10-CM

## 2019-12-20 MED ORDER — METFORMIN HCL ER 500 MG PO TB24: ORAL_TABLET | ORAL | 1 refills | Status: DC

## 2020-02-25 ENCOUNTER — Other Ambulatory Visit: Payer: Self-pay | Admitting: Internal Medicine

## 2020-02-25 ENCOUNTER — Other Ambulatory Visit: Payer: Self-pay | Admitting: Physician Assistant

## 2020-02-25 NOTE — Telephone Encounter (Signed)
This should be routed to the PCP I am not sure why this came to me

## 2020-02-25 NOTE — Telephone Encounter (Signed)
Please advise, Potassium checked 11/05/2019 was 3.4. Thank you.

## 2020-02-29 ENCOUNTER — Other Ambulatory Visit: Payer: Self-pay | Admitting: *Deleted

## 2020-02-29 MED ORDER — ATORVASTATIN CALCIUM 80 MG PO TABS: 80.0000 mg | ORAL_TABLET | Freq: Every day | ORAL | 3 refills | Status: AC

## 2020-02-29 MED ORDER — VALSARTAN-HYDROCHLOROTHIAZIDE 320-25 MG PO TABS: 1.0000 | ORAL_TABLET | Freq: Every day | ORAL | 3 refills | Status: AC

## 2020-02-29 MED ORDER — AMLODIPINE BESYLATE 5 MG PO TABS: 5.0000 mg | ORAL_TABLET | Freq: Every day | ORAL | 3 refills | Status: AC

## 2020-02-29 MED ORDER — CARVEDILOL 12.5 MG PO TABS: 12.5000 mg | ORAL_TABLET | Freq: Two times a day (BID) | ORAL | 3 refills | Status: AC

## 2020-03-31 DIAGNOSIS — R519 Headache, unspecified: Secondary | ICD-10-CM | POA: Diagnosis not present

## 2020-05-04 ENCOUNTER — Encounter: Payer: Self-pay | Admitting: *Deleted

## 2020-05-04 NOTE — Progress Notes (Signed)

## 2020-05-12 NOTE — Progress Notes (Signed)
Things That May Be Affecting Your Health:  Alcohol  Hearing loss  Pain    Depression  Home Safety  Sexual Health  x Diabetes  Lack of physical activity x Stress   Difficulty with daily activities  Loneliness  Tiredness   Drug use  Medicines  Tobacco use   Falls  Motor Vehicle Safety  Weight  x Food choices  Oral Health  Other    YOUR PERSONALIZED HEALTH PLAN : 1. Schedule your next subsequent Medicare Wellness visit in one year 2. Attend all of your regular appointments to address your medical issues 3. Complete the preventative screenings and services   Annual Wellness Visit   Medicare Covered Preventative Screenings and [place] Men and Women Who How Often Need? Date of Last Service Action  Abdominal Aortic Aneurysm Adults with AAA risk factors Once N  2021- [place]   Alcohol Misuse and Counseling All Adults Screening once a year if no alcohol misuse. Counseling up to 4 face to face sessions. y    Bone Density Measurement  Adults at risk for osteoporosis Once every 2 yrs n 2019    Lipid Panel Z13.6 All adults without CV disease Once every 5 yrs n   2019   Colorectal Cancer   Stool sample or  Colonoscopy All adults 41 and older   Once every year  Every 10 years n  2021     Depression All Adults Once a year  Today   Diabetes Screening Blood glucose, post glucose load, or GTT Z13.1  All adults at risk  Pre-diabetics  Once per year  Twice per year n     Diabetes  Self-Management Training All adults Diabetics 10 hrs first year; 2 hours subsequent years. Requires Copay y    Glaucoma  Diabetics  Family history of glaucoma  African Americans 24 yrs +  Hispanic Americans 68 yrs + Annually - requires coppay n  Due in july   Hepatitis C Z72.89 or F19.20  High Risk for HCV  Born between 1945 and 1965  Annually  Once n  2018   HIV Z11.4 All adults based on risk  Annually btw ages 3 & 26 regardless of risk  Annually > 65 yrs if at  increased risk n  2010   Lung Cancer Screening Asymptomatic adults aged 44-77 with 30 pack yr history and current smoker OR quit within the last 15 yrs Annually Must have counseling and shared decision making documentation before first screen n     Medical Nutrition Therapy Adults with   Diabetes  Renal disease  Kidney transplant within past 3 yrs 3 hours first year; 2 hours subsequent years y    Obesity and Counseling All adults Screening once a year Counseling if BMI 30 or higher  Today   Tobacco Use Counseling Adults who use tobacco  Up to 8 visits in one year n    Vaccines Z23  Hepatitis B  Influenza   Pneumonia  Adults   Once  Once every flu season  Two different vaccines separated by one year ?  covid booster recommended  Next Annual Wellness Visit People with Medicare Every year  Today     Services & Screenings Women Who How Often Need  Date of Last Service Action  Mammogram  Z12.31 Women over 40 One baseline ages 59-39. Annually ager 24 yrs+ n 2021    Pap tests All women Annually if high risk. Every 2 yrs for normal risk women  n     Screening for cervical cancer with   Pap (Z01.419 nl or Z01.411abnl) &  HPV Z11.51 Women aged 37 to 54 Once every 5 yrs n    Screening pelvic and breast exams All women Annually if high risk. Every 2 yrs for normal risk women n    Sexually Transmitted Diseases  Chlamydia  Gonorrhea  Syphilis All at risk adults Annually for non pregnant females at increased risk n        Services & Screenings Men Who How Ofter Need  Date of Last Service Action  Prostate Cancer - DRE & PSA Men over 50 Annually.  DRE might require a copay.        Sexually Transmitted Diseases  Syphilis All at risk adults Annually for men at increased risk      Health Maintenance List Health Maintenance  Topic Date Due  . COVID-19 Vaccine (3 - Moderna risk 4-dose series) 04/25/2019  . HEMOGLOBIN A1C  02/05/2020  . LIPID PANEL  02/26/2020  .  FOOT EXAM  05/02/2020  . OPHTHALMOLOGY EXAM  08/15/2020  . INFLUENZA VACCINE  09/04/2020  . MAMMOGRAM  05/24/2021  . TETANUS/TDAP  10/17/2022  . COLONOSCOPY (Pts 45-68yrs Insurance coverage will need to be confirmed)  03/09/2024  . DEXA SCAN  Completed  . Hepatitis C Screening  Completed  . PNA vac Low Risk Adult  Completed  . HPV VACCINES  Aged Out

## 2020-05-16 ENCOUNTER — Other Ambulatory Visit: Payer: Self-pay | Admitting: Internal Medicine

## 2020-05-18 ENCOUNTER — Ambulatory Visit (INDEPENDENT_AMBULATORY_CARE_PROVIDER_SITE_OTHER): Payer: Medicare Other | Admitting: Internal Medicine

## 2020-05-18 ENCOUNTER — Encounter: Payer: Self-pay | Admitting: Internal Medicine

## 2020-05-18 ENCOUNTER — Other Ambulatory Visit: Payer: Self-pay

## 2020-05-18 VITALS — BP 102/71 | HR 74 | Temp 98.7°F | Ht 64.0 in | Wt 194.4 lb

## 2020-05-18 DIAGNOSIS — N644 Mastodynia: Secondary | ICD-10-CM | POA: Diagnosis not present

## 2020-05-18 DIAGNOSIS — E1151 Type 2 diabetes mellitus with diabetic peripheral angiopathy without gangrene: Secondary | ICD-10-CM | POA: Diagnosis not present

## 2020-05-18 DIAGNOSIS — R634 Abnormal weight loss: Secondary | ICD-10-CM | POA: Insufficient documentation

## 2020-05-18 LAB — POCT GLYCOSYLATED HEMOGLOBIN (HGB A1C): Hemoglobin A1C: 7 % — AB (ref 4.0–5.6)

## 2020-05-18 LAB — GLUCOSE, CAPILLARY: Glucose-Capillary: 122 mg/dL — ABNORMAL HIGH (ref 70–99)

## 2020-05-18 NOTE — Assessment & Plan Note (Signed)
Lab Results  Component Value Date   HGBA1C 7.0 (A) 05/18/2020   At goal. Currently on metformin 500mg  BID. Tolerating well without significant GI side effects. Denies nausea, vomiting, diarrhea. Denies polyuria, polydipsia, polyphagia. Mentioning following low carb diet and increasing daily exercise.  A/P Controlled diabetes mellitus.  - C/w metformin 500mg  BID

## 2020-05-18 NOTE — Patient Instructions (Addendum)
Dear [name],  Thank you for allowing [place] to provide your care today. Today we discussed your breast pain    I have ordered mammograms for you. I will call if any are abnormal.    Today we made the following changes to your medications:    Please follow-up as needed.    Please call the internal medicine center clinic if you have any questions or concerns, we may be able to help and keep you from a long and expensive emergency room wait. Our clinic and after hours phone number is [phone], the best time to call is Monday through Friday 9 am to 4 pm but there is always someone available 24/7 if you have an emergency. If you need medication refills please notify your pharmacy one week in advance and they will send [place] a request.    If you have not gotten the COVID vaccine, I recommend doing so:  You may get it at your local [facility] or [facility] OR To schedule an appointment for a COVID vaccine or be added to the vaccine wait list: Go to [email or web address]   OR Go to [email or web address]                  OR Call [phone]                                     OR Call [phone] and select Option 2  Thank you for choosing    Breast Tenderness Breast tenderness is a common problem for women of all ages, but may also occur in men. Breast tenderness may range from mild discomfort to severe pain. In women, the pain usually comes and goes with the menstrual cycle, but it can also be constant. Breast tenderness has many possible causes, including hormone changes, infections, and taking certain medicines. You may have tests, such as a mammogram or an ultrasound, to check for any unusual findings. Having breast tenderness usually does not mean that you have breast cancer. Follow these instructions at home: Managing pain and discomfort  If directed, put ice to the painful area. To do this: ? Put ice in a plastic bag. ? Place a towel between your skin and the  bag. ? Leave the ice on for 20 minutes, 2-3 times a day.  Wear a supportive bra, especially during exercise. You may also want to wear a supportive bra while sleeping if your breasts are very tender.   Medicines  Take over-the-counter and prescription medicines only as told by your health care provider. If the cause of your pain is infection, you may be prescribed an antibiotic medicine.  If you were prescribed an antibiotic, take it as told by your health care provider. Do not stop taking the antibiotic even if you start to feel better. Eating and drinking  Your health care provider may recommend that you lessen the amount of fat in your diet. You can do this by: ? Limiting fried foods. ? Cooking foods using methods such as baking, boiling, grilling, and broiling.  Decrease the amount of caffeine in your diet. Instead, drink more water and choose caffeine-free drinks. General instructions  Keep a log of the days and times when your breasts are most tender.  Ask your health care provider how to do breast exams at home. This will help you notice if you have an unusual growth or lump.  Keep all  follow-up visits as told by your health care provider. This is important.   Contact a health care provider if:  Any part of your breast is hard, red, and hot to the touch. This may be a sign of infection.  You are a woman and: ? Not breastfeeding and you have fluid, especially blood or pus, coming out of your nipples. ? Have a new or painful lump in your breast that remains after your menstrual period ends.  You have a fever.  Your pain does not improve or it gets worse.  Your pain is interfering with your daily activities. Summary  Breast tenderness may range from mild discomfort to severe pain.  Breast tenderness has many possible causes, including hormone changes, infections, and taking certain medicines.  It can be treated with ice, wearing a supportive bra, and medicines.  Make  changes to your diet if told to by your health care provider. This information is not intended to replace advice given to you by your health care provider. Make sure you discuss any questions you have with your health care provider. Document Revised: 06/15/2018 Document Reviewed: 06/15/2018 Elsevier Patient Education  [place].

## 2020-05-18 NOTE — Assessment & Plan Note (Addendum)
[name] is a 75 yo F w/ PMH of T2DM, HLD, CAD s/p stent presenting to [facility] with complaint of left breast pain. She was in her usual state of health until 2 months prior when she began to notice pain under her left breast that radiates anteriorly. She denies any fevers, chills, nipple discharge, rash or other skin changes. She has not noticed any obvious nodules or masses. She expresses concern regarding possible malignancy as her sister recently passed from ovarian cancer. She reached out to [place] regarding her next mammogram and she was directed to [facility] for evaluation.  A/p Presenting with breast pain of unclear etiology. Chart review shows no hx of breast ca. Previous mammograms reviewed with fibroglandular tissue without evidence of malignancy. Physical exam suggest fibrocystic changes vs ca. Will get diagnostic mammogram for further evaluation - Diagnostic mammogram

## 2020-05-18 NOTE — Progress Notes (Signed)
CC: breast pain  HPI: [name] is a 75 y.o. with PMH listed below presenting with complaint of breast pain. Please see problem based assessment and plan for further details.  Past Medical History:  Diagnosis Date  . Back pain    reason unknown  . CAD (coronary artery disease)    a. 07/2014: DES to OM1  b. 02/2015: CTO to RCA   . Cervical lymphadenopathy   . Cervical polyp   . Coronary artery disease    Prior stent of the lCX with chronic total occlusion of a small RCA. Last cath in 2004 showing patent stent, nonobstructive disease and total RCA. EF is 65 to 70%. She has been managed medically.   . Depression    sees a counselor at [facility] but no meds  . Diabetes mellitus without complication ([place])    takes Metformin daily  . History of colon polyps   . Hyperlipidemia    takes Crestor daily  . Hypertension    takes Amlodipine,Coreg,and Prinzide daily  . Hypothyroidism    takes Synthroid daily  . Mild obstructive sleep apnea 10/16/2012   03/12/2006: Moderate Obstructive sleep apnea with a Respiratory Disturbance Index of 29 events per hour.  05/26/13 Mild obstructive sleep apnea/hypopnea syndrome, AHI 7.5 per hour with events in all positions, especially supine. REM AHI 25.9 per hour. Moderately loud snoring with oxygen desaturation to a nadir of 82% and mean oxygen saturation through the study of 92.3% on room air.  07/31/18: Sleep s  . Neck mass   . OSA (obstructive sleep apnea)    PT DENIES, STATES SHE WAS TOLD SHE DIDN'T HAVE OSA, NO CPAP  . PTSD (post-traumatic stress disorder)   . Renal artery stenosis ([place]) 2007  . Stroke ([place])    TIA- LAST ONE GREATER THAN 10 YEARS PER PT  . TIA (transient ischemic attack)   . Tubular adenoma of rectum 10/24/04  . Umbilical hernia   . Urinary tract infection     Review of Systems: Review of Systems  Constitutional: Positive for weight loss (intentional with exercise and dietary changes). Negative for chills and fever.   Eyes: Negative for blurred vision.  Respiratory: Negative for shortness of breath.   Cardiovascular: Negative for chest pain, palpitations and leg swelling.  Gastrointestinal: Negative for constipation, diarrhea, nausea and vomiting.  All other systems reviewed and are negative.    Physical Exam: Vitals:   05/18/20 1319  BP: 102/71  Pulse: 74  Temp: 98.7 F (37.1 C)  TempSrc: Oral  SpO2: 98%  Weight: 194 lb 6.4 oz (88.2 kg)  Height: 5\' 4"  (1.626 m)    Gen: Well-developed, well nourished, NAD HEENT: NCAT head, hearing intact Breast: No nipple discharge, multiple irregular palpable nodules in left breast without fluctuance, erythema, warmth or tenderness CV: RRR, S1, S2 normal Pulm: CTAB, No rales, no wheezes Extm: ROM intact, Peripheral pulses intact, No peripheral edema Skin: Dry, Warm, normal turgor, no wounds, no rashes, no lesions  Assessment & Plan:   DM (diabetes mellitus), type 2 with peripheral vascular complications (HCC) Lab Results  Component Value Date   HGBA1C 7.0 (A) 05/18/2020   At goal. Currently on metformin 500mg  BID. Tolerating well without significant GI side effects. Denies nausea, vomiting, diarrhea. Denies polyuria, polydipsia, polyphagia. Mentioning following low carb diet and increasing daily exercise.  A/P Controlled diabetes mellitus.  - C/w metformin 500mg  BID  Abnormal intentional weight loss Wt Readings from Last 3 Encounters:  05/18/20 194 lb  6.4 oz (88.2 kg)  11/08/19 205 lb 9.6 oz (93.3 kg)  11/05/19 206 lb 11.2 oz (93.8 kg)   [name] presents with concern regarding weight loss over the last 6 months. Mentions following increasing dietary changes and exercise but expresses fear of malignancy due to recent death of sister who passed from ovarian cancer. Mentions that she has not noticed any new vaginal discharge, bleed, heat/cold intolerance or skin changes.  A/P Present with weight loss with exercise and dietary changes. Up to date  on age-appropriate cancer screening. Denies systemic symptoms suggestive of hormonal changes. Has lost about 10 pounds over the last 6 months. - Advised on continued monitoring for worsening abnormal weight loss >10 pounds in 6 months  Breast pain, left [name] is a 75 yo F w/ PMH of T2DM, HLD, CAD s/p stent presenting to [facility] with complaint of left breast pain. She was in her usual state of health until 2 months prior when she began to notice pain under her left breast that radiates anteriorly. She denies any fevers, chills, nipple discharge, rash or other skin changes. She has not noticed any obvious nodules or masses. She expresses concern regarding possible malignancy as her sister recently passed from ovarian cancer. She reached out to [place] regarding her next mammogram and she was directed to [facility] for evaluation.  A/p Presenting with breast pain of unclear etiology. Chart review shows no hx of breast ca. Previous mammograms reviewed with fibroglandular tissue without evidence of malignancy. Physical exam suggest fibrocystic changes vs ca. Will get diagnostic mammogram for further evaluation - Diagnostic mammogram    Patient discussed with Dr. Evette Doffing  -Gilberto Better, [facility] Pager: [phone]

## 2020-05-18 NOTE — Assessment & Plan Note (Signed)
Wt Readings from Last 3 Encounters:  05/18/20 194 lb 6.4 oz (88.2 kg)  11/08/19 205 lb 9.6 oz (93.3 kg)  11/05/19 206 lb 11.2 oz (93.8 kg)   [name] presents with concern regarding weight loss over the last 6 months. Mentions following increasing dietary changes and exercise but expresses fear of malignancy due to recent death of sister who passed from ovarian cancer. Mentions that she has not noticed any new vaginal discharge, bleed, heat/cold intolerance or skin changes.  A/P Present with weight loss with exercise and dietary changes. Up to date on age-appropriate cancer screening. Denies systemic symptoms suggestive of hormonal changes. Has lost about 10 pounds over the last 6 months. - Advised on continued monitoring for worsening abnormal weight loss >10 pounds in 6 months

## 2020-05-19 NOTE — Progress Notes (Signed)
Internal Medicine Clinic Attending  Case discussed with Dr. Lee  At the time of the visit.  We reviewed the resident's history and exam and pertinent patient test results.  I agree with the assessment, diagnosis, and plan of care documented in the resident's note.    

## 2020-05-29 ENCOUNTER — Ambulatory Visit (INDEPENDENT_AMBULATORY_CARE_PROVIDER_SITE_OTHER): Payer: Medicare Other | Admitting: Student

## 2020-05-29 ENCOUNTER — Encounter: Payer: Self-pay | Admitting: Student

## 2020-05-29 ENCOUNTER — Other Ambulatory Visit: Payer: Self-pay

## 2020-05-29 VITALS — BP 150/84 | HR 77

## 2020-05-29 DIAGNOSIS — Z0000 Encounter for general adult medical examination without abnormal findings: Secondary | ICD-10-CM

## 2020-05-29 NOTE — Addendum Note (Signed)
Addended byLinwood Dibbles on: 05/29/2020 01:33 PM   Modules accepted: Orders

## 2020-05-29 NOTE — Progress Notes (Signed)
Internal Medicine Clinic Attending  Case discussed with Dr. Coy Saunas at the time of the visit.  We reviewed the AWV findings.  I agree with the assessment, diagnosis, and plan of care documented in the AWV note.

## 2020-05-29 NOTE — Progress Notes (Signed)
This AWV is being conducted by [name] only. The patient was located at home and I was located in [facility]. The patient's identity was confirmed using their DOB and current address. The patient or his/her legal guardian has consented to being evaluated through a telephone encounter and understands the associated risks (an examination cannot be done and the patient may need to come in for an appointment) / benefits (allows the patient to remain at home, decreasing exposure to coronavirus). I personally spent 37 minutes conducting the AWV.  Subjective:   [name] is a 75 y.o. female who presents for a Medicare Annual Wellness Visit.  The following items have been reviewed and updated today in the appropriate area in the EMR.   Health Risk Assessment  Height, weight, BMI, and BP Visual acuity if needed Depression screen Fall risk / safety level Advance directive discussion Medical and family history were reviewed and updated Updating list of other providers & suppliers Medication reconciliation, including over the counter medicines Cognitive screen Written screening schedule Risk Factor list Personalized health advice, risky behaviors, and treatment advice  Social History   Social History Narrative   Current Social History 05/29/2020        Patient lives alone ([place] lives with her on an intermittent basis) in a home which is 1 story. There are steps up to the entrance the patient uses.       Patient's method of transportation is personal car.      The highest level of education was some college.      The patient currently retired.      Identified important Relationships are "friends"       Pets : None       Interests / Fun: Going to the [employer]       Current Stressors: None       Religious / Personal Beliefs: "I believe in God"          Objective:    Vitals: BP (!) 150/84   Pulse 77  Vitals are patient reported  Activities of Daily Living In your  present state of health, do you have any difficulty performing the following activities: 05/29/2020 05/18/2020  Hearing? N N  Vision? Y N  Comment Only at night -  Difficulty concentrating or making decisions? N N  Walking or climbing stairs? N N  Comment - -  Dressing or bathing? N N  Doing errands, shopping? N N  Some recent data might be hidden    Goals Goals    . Blood Pressure < 140/90    . HEMOGLOBIN A1C < 7.0    . Increase physical activity     Continue going to the [facility] for exercise 2-3 times/ week.  Begin seated and standing exercises with exercise band to increase strength and balance on days you cannot go the the [facility]    . LDL CALC < 100       Fall Risk Fall Risk  05/29/2020 05/18/2020 11/08/2019 11/05/2019 05/13/2019  Falls in the past year? 0 0 1 0 0  Number falls in past yr: - - 0 0 -  Injury with Fall? - - 0 0 -  Risk for fall due to : - - History of fall(s);Impaired balance/gait - No Fall Risks  Risk for fall due to: Comment - - - - -  Follow up Falls evaluation completed - Falls prevention discussed - Falls prevention discussed    Depression Screen [facility] 2/9  Scores 05/29/2020 11/08/2019 05/13/2019 04/21/2019  PHQ - 2 Score 1 1 0 0  PHQ- 9 Score 4 5 - -     Cognitive Testing Six-Item Cognitive Screener   "I would like to ask you some questions that ask you to use your memory. I am going to name three objects. Please wait until I say all three words, then repeat them. Remember what they are  because I am going to ask you to name them again in a few minutes. Please repeat these words for me: APPLE--TABLE--PENNY." (Interviewer may repeat names 3 times if necessary but repetition not scored.)  Did patient correctly repeat all three words? Yes - may proceed with screen  What year is this? Correct What month is this? Correct What day of the week is this? Correct  What were the three objects I asked you to remember? . Apple Correct . Table Correct . Penny  Correct  Score one point for each incorrect answer.  A score of 2 or more points warrants additional investigation.  Patient's score 0     Assessment and Plan:     An order was already placed for a diagnostic mammogram.  The patient was reminded to call [facility] to schedule the mammogram (a reminder will also be placed in patient packet)  The patient scored a 4 on the PHQ9 and expressed interest in grief counseling d/t the recent passing of her sister.  RN encouraged patient to call the hospice organization which cared for her sister to express interest in grief counseling.  Patient has been taking her b/p readings at home and verbalized her readings ranged from 105/59 to 146/102.  She was encouraged to take her b/p at same time daily and call triage at the end of the week with readings.  She also endorsed drinking a lot of caffeine (coffee), she was educated about caffeine's effect on b/o and encouraged to decrease her caffeine intake.  Patient has made a goal to increase her lifestyle and is exercising at the [facility].  She will also begin seated and standing exercises 2-3times a week for 10 minutes with exercise band to increase strength and balance.  CDC Handout on Fall Prevention and Handout on Home Exercise Program, Access codes [email or web address] and [email or web address] given/mailed to patient with exercise band.   During the course of the visit the patient was educated and counseled about appropriate screening and preventive services as documented in the assessment and plan.  The printed AVS was given to the patient and included an updated screening schedule, a list of risk factors, and personalized health advice.        Higinio Roger, RN  05/29/2020

## 2020-05-29 NOTE — Patient Instructions (Addendum)
All Notes   Progress Notes by Lucious Groves, DO at 05/04/2020 11:05 AM  Author: Lucious Groves, DO Author Type: Physician Filed: 05/12/2020 5:22 PM  Note Status: Signed Cosign: Cosign Not Required Encounter Date: 05/04/2020  Editor: Lucious Groves, DO (Physician)             Things That May Be Affecting Your Health:  Alcohol  Hearing loss  Pain    Depression  Home Safety  Sexual Health  x Diabetes  Lack of physical activity x Stress   Difficulty with daily activities  Loneliness  Tiredness   Drug use  Medicines  Tobacco use   Falls  Motor Vehicle Safety  Weight  x Food choices  Oral Health  Other    YOUR PERSONALIZED HEALTH PLAN : 1. Schedule your next subsequent Medicare Wellness visit in one year 2. Attend all of your regular appointments to address your medical issues 3. Complete the preventative screenings and services 4.  Please make your annual appointment with your eye doctor. 5.  Please call [facility] and schedule your Mammogram. 6.  We are so sorry about your sister's recent passing, please call the [facility] which provided care to your sister and schedule grief counseling. 7.  Congratulations on increasing your activity at the [facility]!  Begin seated and standing exercises with exercise band to increase strength and balance on days you cannot go the the [facility]   Annual Wellness Visit                       Medicare Covered Preventative Screenings and Services  Services & Screenings Men and Women Who How Often Need? Date of Last Service Action  Abdominal Aortic Aneurysm Adults with AAA risk factors Once N  2021- [place]   Alcohol Misuse and Counseling All Adults Screening once a year if no alcohol misuse. Counseling up to 4 face to face sessions. y    Bone Density Measurement  Adults at risk for osteoporosis Once every 2 yrs n 2019    Lipid Panel Z13.6 All adults without CV disease Once every 5 yrs n   2019   Colorectal Cancer    Stool sample or  Colonoscopy All adults 56 and older   Once every year  Every 10 years n  2021     Depression All Adults Once a year  Today   Diabetes Screening Blood glucose, post glucose load, or GTT Z13.1  All adults at risk  Pre-diabetics  Once per year  Twice per year n     Diabetes  Self-Management Training All adults Diabetics 10 hrs first year; 2 hours subsequent years. Requires Copay y    Glaucoma  Diabetics  Family history of glaucoma  African Americans 80 yrs +  Hispanic Americans 47 yrs + Annually - requires coppay n  Due in july   Hepatitis C Z72.89 or F19.20  High Risk for HCV  Born between 1945 and 1965  Annually  Once n  2018   HIV Z11.4 All adults based on risk  Annually btw ages 60 & 25 regardless of risk  Annually > 65 yrs if at increased risk n  2010   Lung Cancer Screening Asymptomatic adults aged 64-77 with 30 pack yr history and current smoker OR quit within the last 15 yrs Annually Must have counseling and shared decision making documentation before first screen n     Medical Nutrition Therapy Adults with   Diabetes  Renal disease  Kidney transplant within past 3 yrs 3 hours first year; 2 hours subsequent years y    Obesity and Counseling All adults Screening once a year Counseling if BMI 30 or higher  Today   Tobacco Use Counseling Adults who use tobacco  Up to 8 visits in one year n    Vaccines Z23  Hepatitis B  Influenza   Pneumonia  Adults   Once  Once every flu season  Two different vaccines separated by one year ?  covid booster recommended  Next Annual Wellness Visit People with Medicare Every year  Today     [place] Women Who How Often Need  Date of Last Service Action  Mammogram  Z12.31 Women over 81 One baseline ages 9-39. Annually ager 50 yrs+ n 2021    Pap tests All women Annually if high risk. Every 2 yrs for normal risk women n     Screening  for cervical cancer with   Pap (Z01.419 nl or Z01.411abnl) &  HPV Z11.51 Women aged 60 to 64 Once every 5 yrs n    Screening pelvic and breast exams All women Annually if high risk. Every 2 yrs for normal risk women n    Sexually Transmitted Diseases  Chlamydia  Gonorrhea  Syphilis All at risk adults Annually for non pregnant females at increased risk n        Services & Screenings Men Who How Ofter Need  Date of Last Service Action  Prostate Cancer - DRE & PSA Men over 50 Annually.  DRE might require a copay.        Sexually Transmitted Diseases  Syphilis All at risk adults Annually for men at increased risk             Health Maintenance, Female Adopting a healthy lifestyle and getting preventive care are important in promoting health and wellness. Ask your health care provider about:  The right schedule for you to have regular tests and exams.  Things you can do on your own to prevent diseases and keep yourself healthy. What should I know about diet, weight, and exercise? Eat a healthy diet  Eat a diet that includes plenty of vegetables, fruits, low-fat dairy products, and lean protein.  Do not eat a lot of foods that are high in solid fats, added sugars, or sodium.   Maintain a healthy weight Body mass index (BMI) is used to identify weight problems. It estimates body fat based on height and weight. Your health care provider can help determine your BMI and help you achieve or maintain a healthy weight. Get regular exercise Get regular exercise. This is one of the most important things you can do for your health. Most adults should:  Exercise for at least 150 minutes each week. The exercise should increase your heart rate and make you sweat (moderate-intensity exercise).  Do strengthening exercises at least twice a week. This is in addition to the moderate-intensity exercise.  Spend less time sitting. Even light physical activity can be  beneficial. Watch cholesterol and blood lipids Have your blood tested for lipids and cholesterol at 73 years of age, then have this test every 5 years. Have your cholesterol levels checked more often if:  Your lipid or cholesterol levels are high.  You are older than 73 years of age.  You are at high risk for heart disease. What should I know about cancer screening? Depending on your health history and family history, you may  need to have cancer screening at various ages. This may include screening for:  Breast cancer.  Cervical cancer.  Colorectal cancer.  Skin cancer.  Lung cancer. What should I know about heart disease, diabetes, and high blood pressure? Blood pressure and heart disease  High blood pressure causes heart disease and increases the risk of stroke. This is more likely to develop in people who have high blood pressure readings, are of African descent, or are overweight.  Have your blood pressure checked: ? Every 3-5 years if you are 81-14 years of age. ? Every year if you are 53 years old or older. Diabetes Have regular diabetes screenings. This checks your fasting blood sugar level. Have the screening done:  Once every three years after age 77 if you are at a normal weight and have a low risk for diabetes.  More often and at a younger age if you are overweight or have a high risk for diabetes. What should I know about preventing infection? Hepatitis B If you have a higher risk for hepatitis B, you should be screened for this virus. Talk with your health care provider to find out if you are at risk for hepatitis B infection. Hepatitis C Testing is recommended for:  Everyone born from [birth year] through 1965.  Anyone with known risk factors for hepatitis C. Sexually transmitted infections (STIs)  Get screened for STIs, including gonorrhea and chlamydia, if: ? You are sexually active and are younger than 73 years of age. ? You are older than 73 years of age and  your health care provider tells you that you are at risk for this type of infection. ? Your sexual activity has changed since you were last screened, and you are at increased risk for chlamydia or gonorrhea. Ask your health care provider if you are at risk.  Ask your health care provider about whether you are at high risk for HIV. Your health care provider may recommend a prescription medicine to help prevent HIV infection. If you choose to take medicine to prevent HIV, you should first get tested for HIV. You should then be tested every 3 months for as long as you are taking the medicine. Pregnancy  If you are about to stop having your period (premenopausal) and you may become pregnant, seek counseling before you get pregnant.  Take 400 to 800 micrograms (mcg) of folic acid every day if you become pregnant.  Ask for birth control (contraception) if you want to prevent pregnancy. Osteoporosis and menopause Osteoporosis is a disease in which the bones lose minerals and strength with aging. This can result in bone fractures. If you are 50 years old or older, or if you are at risk for osteoporosis and fractures, ask your health care provider if you should:  Be screened for bone loss.  Take a calcium or vitamin D supplement to lower your risk of fractures.  Be given hormone replacement therapy (HRT) to treat symptoms of menopause. Follow these instructions at home: Lifestyle  Do not use any products that contain nicotine or tobacco, such as cigarettes, e-cigarettes, and chewing tobacco. If you need help quitting, ask your health care provider.  Do not use street drugs.  Do not share needles.  Ask your health care provider for help if you need support or information about quitting drugs. Alcohol use  Do not drink alcohol if: ? Your health care provider tells you not to drink. ? You are pregnant, may be pregnant, or are planning to become  pregnant.  If you drink alcohol: ? Limit how  much you use to 0-1 drink a day. ? Limit intake if you are breastfeeding.  Be aware of how much alcohol is in your drink. In the [place] one drink equals one 12 oz bottle of beer (355 mL), one 5 oz glass of wine (148 mL), or one 1 oz glass of hard liquor (44 mL). General instructions  Schedule regular health, dental, and eye exams.  Stay current with your vaccines.  Tell your health care provider if: ? You often feel depressed. ? You have ever been abused or do not feel safe at home. Summary  Adopting a healthy lifestyle and getting preventive care are important in promoting health and wellness.  Follow your health care provider's instructions about healthy diet, exercising, and getting tested or screened for diseases.  Follow your health care provider's instructions on monitoring your cholesterol and blood pressure. This information is not intended to replace advice given to you by your health care provider. Make sure you discuss any questions you have with your health care provider. Document Revised: 01/14/2018 Document Reviewed: 01/14/2018 Elsevier Patient Education  2021 [place] A mammogram is a low energy X-ray of the breasts that is done to check for abnormal changes. This procedure can screen for and detect any changes that may indicate breast cancer. Mammograms are regularly done on women. A man may have a mammogram if he has a lump or swelling in his breast. A mammogram can also identify other changes and variations in the breast, such as:  Inflammation of the breast tissue (mastitis).  An infected area that contains a collection of pus (abscess).  A fluid-filled sac (cyst).  Fibrocystic changes. This is when breast tissue becomes denser, which can make the tissue feel rope-like or uneven under the skin.  Tumors that are not cancerous (benign). Tell a health care provider:  About any allergies you have.  If you have breast implants.  If you have  had previous breast disease, biopsy, or surgery.  If you are breastfeeding.  If you are younger than age 78.  If you have a family history of breast cancer.  Whether you are pregnant or may be pregnant. What are the risks? Generally, this is a safe procedure. However, problems may occur, including:  Exposure to radiation. Radiation levels are very low with this test.  The results being misinterpreted.  The need for further tests.  The inability of the mammogram to detect certain cancers. What happens before the procedure?  Schedule your test about 1-2 weeks after your menstrual period if you are still menstruating. This is usually when your breasts are the least tender.  If you have had a mammogram done at a different facility in the past, get the mammogram X-rays or have them sent to your current exam facility. The new and old images will be compared.  Wash your breasts and underarms on the day of the test.  Do not wear deodorants, perfumes, lotions, or powders anywhere on your body on the day of the test.  Remove any jewelry from your neck.  Wear clothes that you can change into and out of easily. What happens during the procedure?  You will undress from the waist up and put on a gown that opens in the front.  You will stand in front of the X-ray machine.  Each breast will be placed between two plastic or glass plates. The plates will compress your breast  for a few seconds. Try to stay as relaxed as possible during the procedure. This does not cause any harm to your breasts and any discomfort you feel will be very brief.  X-rays will be taken from different angles of each breast. The procedure may vary among health care providers and hospitals.   What happens after the procedure?  The mammogram will be examined by a specialist (radiologist).  You may need to repeat certain parts of the test, depending on the quality of the images. This is commonly done if the  radiologist needs a better view of the breast tissue.  You may resume your normal activities.  It is up to you to get the results of your procedure. Ask your health care provider, or the department that is doing the procedure, when your results will be ready. Summary  A mammogram is a low energy X-ray of the breasts that is done to check for abnormal changes. A man may have a mammogram if he has a lump or swelling in his breast.  If you have had a mammogram done at a different facility in the past, get the mammogram X-rays or have them sent to your current exam facility in order to compare them.  Schedule your test about 1-2 weeks after your menstrual period if you are still menstruating.  For this test, each breast will be placed between two plastic or glass plates. The plates will compress your breast for a few seconds.  Ask when your test results will be ready. Make sure you get your test results. This information is not intended to replace advice given to you by your health care provider. Make sure you discuss any questions you have with your health care provider. Document Revised: 09/11/2017 Document Reviewed: 09/11/2017 Elsevier Patient Education  [place].

## 2020-06-07 ENCOUNTER — Other Ambulatory Visit: Payer: Self-pay | Admitting: Internal Medicine

## 2020-06-07 DIAGNOSIS — N644 Mastodynia: Secondary | ICD-10-CM

## 2020-06-20 ENCOUNTER — Other Ambulatory Visit: Payer: Self-pay | Admitting: *Deleted

## 2020-06-20 DIAGNOSIS — E1151 Type 2 diabetes mellitus with diabetic peripheral angiopathy without gangrene: Secondary | ICD-10-CM

## 2020-06-20 MED ORDER — METFORMIN HCL ER 500 MG PO TB24: ORAL_TABLET | ORAL | 1 refills | Status: AC

## 2020-07-04 ENCOUNTER — Telehealth: Payer: Self-pay | Admitting: *Deleted

## 2020-07-04 NOTE — Chronic Care Management (AMB) (Signed)
  Care Management   Note  07/04/2020 Name: [name] MRN: [id number] DOB: 28-Aug-1945  [name] is a 75 y.o. year old female who is a primary care patient of Lucious Groves, DO. I reached out to [employer] by phone today in response to a referral sent by [name]'s PCP, Dr. Heber Osseo.     [name] was given information about care management services today including:  1. Care management services include personalized support from designated clinical staff supervised by her physician, including individualized plan of care and coordination with other care providers 2. 24/7 contact phone numbers for assistance for urgent and routine care needs. 3. The patient may stop care management services at any time by phone call to the office staff.  Patient agreed to services and verbal consent obtained.   Follow up plan: Telephone appointment with care management team member scheduled for:07/19/2020  [facility]

## 2020-07-17 ENCOUNTER — Other Ambulatory Visit: Payer: Self-pay

## 2020-07-17 ENCOUNTER — Ambulatory Visit
Admission: RE | Admit: 2020-07-17 | Discharge: 2020-07-17 | Disposition: A | Discharge status: Home / Self Care | Payer: Medicare Other | Source: Ambulatory Visit | Attending: Student in an Organized Health Care Education/Training Program | Admitting: Student in an Organized Health Care Education/Training Program

## 2020-07-17 ENCOUNTER — Ambulatory Visit: Payer: Medicare Other

## 2020-07-17 DIAGNOSIS — N644 Mastodynia: Secondary | ICD-10-CM

## 2020-07-17 DIAGNOSIS — R922 Inconclusive mammogram: Secondary | ICD-10-CM | POA: Diagnosis not present

## 2020-07-19 ENCOUNTER — Ambulatory Visit: Payer: Medicare Other | Admitting: Licensed Clinical Social Worker

## 2020-07-19 NOTE — Patient Instructions (Signed)
Visit Information  Instructions: Patient will contact SW if needs arise.   Patient was given the following information about care management and care coordination services today, agreed to services, and gave verbal consent: 1.care management/care coordination services include personalized support from designated clinical staff supervised by their physician, including individualized plan of care and coordination with other care providers 2. 24/7 contact phone numbers for assistance for urgent and routine care needs. 3. The patient may stop care management/care coordination services at any time by phone call to the office staff.  Patient verbalizes understanding of instructions provided today and agrees to view in [place].   The patient has been provided with contact information for the care management team and has been advised to call with any health related questions or concerns.   Milus Height, [place]  Social Worker [facility] Care Management  [phone]

## 2020-07-19 NOTE — Chronic Care Management (AMB) (Signed)
  Care Management   Social Work Visit Note  07/19/2020 Name: [name] MRN: [id number] DOB: Dec 06, 1945  [name] is a 75 y.o. year old female who sees Lucious Groves, DO for primary care. The care management team was consulted for assistance with care management and care coordination needs related to Initial Visit  Patient was given the following information about care management and care coordination services today, agreed to services, and gave verbal consent: 1.care management/care coordination services include personalized support from designated clinical staff supervised by their physician, including individualized plan of care and coordination with other care providers 2. 24/7 contact phone numbers for assistance for urgent and routine care needs. 3. The patient may stop care management/care coordination services at any time by phone call to the office staff.  Engaged with patient by telephone for initial visit in response to provider referral for social work chronic care management and care coordination services.  Assessment: Review of patient history, allergies, and health status during evaluation of patient need for care management/care coordination services.    Interventions:  Patient interviewed and appropriate assessments performed Collaborated with clinical team regarding patient needs  Patient advise no assistance needed with; Food, transportation or housing. Patient state she has a support system including her daughter and grandson.  Patient advised she receives [employer] farm [employer] and doesn't need financial assistance.   SDOH (Social Determinants of Health) assessments performed: Yes     Plan:  The patient has been provided with contact information for the Managed Medicaid care management team and has been advised to call with any health related questions or concerns.   Milus Height, [place]  Social Worker [facility] Care Management  [phone]

## 2020-07-25 NOTE — Progress Notes (Addendum)
Here alone Cardiology Office Note:    Date:  07/26/2020   ID:  Farheen Pfahler, DOB 05-18-1945, MRN [id number]  PCP:  Lucious Groves, DO   [facility] Providers Cardiologist:  Sherren Mocha, MD Cardiology APP:  [facility] [place]     Referring MD: Lucious Groves, DO   Chief Complaint:  Chest Pain    Patient Profile:    [name] is a 75 y.o. female with:  Coronary artery disease Known chronic occlusion of the RCA S/p DES to the LCx in 2016 Cath 6/17: LCx stent patent; CTO of RCA noted Diabetes mellitus Hyperlipidemia Hypertension Prior TIA  Prior CV studies: Echo 07/11/2015 Mod LVH, EF 60-65, no RWMA, Gr 1 DD   Cardiac Catheterization 07/10/2015 LM ost 25 LAD prox 25 LCx prox stent into OM2 patent RCA known to be occluded LVEDP 40      Myoview 01/31/15 Intermediate risk stress nuclear study with small, moderate intensity, reversible distal anterior and basal lateral defects consistent with ischemia in both distributions; EF 63 with normal wall motion.   Carotid US 01/31/14 Bilateral ICA 1-39     History of Present Illness: [name] was last seen in 1/21.  She returns for follow-up.  She is here alone.  She has a long history of elevated heart rates with activity as well as shortness of breath.  She has been trying to exercise recently.  Her breathing has improved.  However, she did have an episode of chest discomfort 2 weeks ago.  This occurred walking into the gym.  She took nitroglycerin with relief.  She has not had recurrent chest symptoms.  She has not had syncope, leg edema.  She sleeps on 1 pillow chronically.  She does not smoke.        Past Medical History:  Diagnosis Date   Back pain    reason unknown   CAD (coronary artery disease)    a. 07/2014: DES to OM1  b. 02/2015: CTO to RCA    Cervical lymphadenopathy    Cervical polyp    Coronary artery disease    Prior stent of the lCX with chronic total occlusion of a small RCA. Last cath in  2004 showing patent stent, nonobstructive disease and total RCA. EF is 65 to 70%. She has been managed medically.    Depression    sees a Social worker at [facility] but no meds   Diabetes mellitus without complication ([place])    takes Metformin daily   History of colon polyps    Hyperlipidemia    takes Crestor daily   Hypertension    takes Amlodipine,Coreg,and Prinzide daily   Hypothyroidism    takes Synthroid daily   Mild obstructive sleep apnea 10/16/2012   03/12/2006: Moderate Obstructive sleep apnea with a Respiratory Disturbance Index of 29 events per hour.  05/26/13 Mild obstructive sleep apnea/hypopnea syndrome, AHI 7.5 per hour with events in all positions, especially supine. REM AHI 25.9 per hour. Moderately loud snoring with oxygen desaturation to a nadir of 82% and mean oxygen saturation through the study of 92.3% on room air.  07/31/18: Sleep s   Neck mass    OSA (obstructive sleep apnea)    PT DENIES, STATES SHE WAS TOLD SHE DIDN'T HAVE OSA, NO CPAP   PTSD (post-traumatic stress disorder)    Renal artery stenosis ([place]) 2007   Stroke ([place])    TIA- LAST ONE GREATER THAN 10 YEARS PER PT   TIA (transient ischemic  attack)    Tubular adenoma of rectum 7/82/95   Umbilical hernia    Urinary tract infection     Current Medications: Current Meds  Medication Sig   amLODipine (NORVASC) 5 MG tablet Take 1 tablet (5 mg total) by mouth daily.   aspirin 81 MG tablet Take 1 tablet (81 mg total) by mouth daily.   atorvastatin (LIPITOR) 80 MG tablet Take 1 tablet (80 mg total) by mouth daily.   carvedilol (COREG) 12.5 MG tablet Take 1 tablet (12.5 mg total) by mouth 2 (two) times daily with a meal.   clopidogrel (PLAVIX) 75 MG tablet Take 1 tablet (75 mg total) by mouth daily. Please make overdue appt with Dr. Burt Knack before anymore refills. Thank you 1st attempt   EQ ALLERGY RELIEF 10 MG tablet TAKE 1 TABLET BY MOUTH ONCE DAILY   fluticasone (FLONASE) 50 MCG/ACT nasal spray Place 1 spray  into both nostrils daily.   levothyroxine (SYNTHROID) 100 MCG tablet Take 1 tablet (100 mcg total) by mouth daily before breakfast.   metFORMIN (GLUCOPHAGE-XR) 500 MG 24 hr tablet TAKE TWO TABLETS BY MOUTH ONCE DAILY WITH  BREAKFAST   neomycin-polymyxin-hydrocortisone (CORTISPORIN) OTIC solution Apply 1-2 drops to toe after soaking twice a day   nitroGLYCERIN (NITROSTAT) 0.4 MG SL tablet Place 1 tablet (0.4 mg total) under the tongue every 5 (five) minutes as needed for chest pain. DO NOT EXCEED A TOTAL OF 3 DOSES IN 15 MINUTES. Please make overdue appt. Thanks 1st attempt   potassium chloride SA (KLOR-CON) 20 MEQ tablet Take 1 tablet by mouth once daily   valsartan-hydrochlorothiazide (DIOVAN-HCT) 320-25 MG tablet Take 1 tablet by mouth daily.   [DISCONTINUED] isosorbide mononitrate (IMDUR) 60 MG 24 hr tablet Take 0.5 tablets (30 mg total) by mouth daily.   Current Facility-Administered Medications for the 07/26/20 encounter (Office Visit) with Richardson Dopp T, PA-C  Medication   0.9 %  sodium chloride infusion     Allergies:   Patient has no known allergies.   Social History   Tobacco Use   Smoking status: Former    Pack years: 0.00   Smokeless tobacco: Never   Tobacco comments:    quit smoking about 30 yrs ago  Vaping Use   Vaping Use: Never used  Substance Use Topics   Alcohol use: Yes    Alcohol/week: 0.0 standard drinks    Comment: Wine on special occasions   Drug use: No     Family Hx: The patient's family history includes Alzheimer's disease in her father; Cancer in her brother and sister; Diabetes in her brother; Heart attack in her maternal grandfather; Hypertension in her mother; Osteoarthritis in her mother. There is no history of Stroke, Colon cancer, Esophageal cancer, Stomach cancer, or Rectal cancer.  Review of Systems  Cardiovascular:  Negative for claudication.    EKGs/Labs/Other Test Reviewed:    EKG:  EKG is   ordered today.  The ekg ordered today  demonstrates NSR, HR 62, left axis deviation, T wave inversions 1, aVL, QTC 428, first-degree block, PR 228, similar to prior tracings  Recent Labs: 11/05/2019: BUN 10; Creatinine, Ser 0.87; Magnesium 1.5; Potassium 3.4; Sodium 142   Recent Lipid Panel Lab Results  Component Value Date/Time   CHOL 90 (L) 02/26/2019 12:00 AM   TRIG 78 02/26/2019 12:00 AM   HDL 31 (L) 02/26/2019 12:00 AM   LDLCALC 43 02/26/2019 12:00 AM   LDLDIRECT 196.8 07/11/2010 11:07 AM      Risk Assessment/Calculations:  Physical Exam:    VS:  BP 140/82   Pulse 69   Ht 5' 4"  (1.626 m)   Wt 200 lb 6.4 oz (90.9 kg)   SpO2 98%   BMI 34.40 kg/m     Wt Readings from Last 3 Encounters:  07/26/20 200 lb 6.4 oz (90.9 kg)  05/18/20 194 lb 6.4 oz (88.2 kg)  11/08/19 205 lb 9.6 oz (93.3 kg)     Constitutional:      Appearance: Healthy appearance. Not in distress.  Neck:     Vascular: JVD normal.  Pulmonary:     Effort: Pulmonary effort is normal.     Breath sounds: No wheezing. No rales.  Cardiovascular:     Normal rate. Regular rhythm. Normal S1. Normal S2.      Murmurs: There is no murmur.  Edema:    Peripheral edema absent.  Abdominal:     Palpations: Abdomen is soft. There is no hepatomegaly.  Skin:    General: Skin is warm and dry.  Neurological:     Mental Status: Alert and oriented to person, place and time.     Cranial Nerves: Cranial nerves are intact.         ASSESSMENT & PLAN:    1. Coronary artery disease involving native coronary artery of native heart with angina pectoris ([place]) History of stenting to the LCx in 2016.  Cardiac catheterization 2017 demonstrated patent LCx stent.  She has known chronic occlusion of the RCA.  She has had fairly chronic shortness of breath with exertion.  Recently, she had an episode of chest discomfort.  She took nitroglycerin with relief.  She has not had any recurrent symptoms.  Electrocardiogram today demonstrates no significant change.  I have  recommended proceeding with stress testing to further evaluate.  If this is significantly abnormal, she may need cardiac catheterization.  I will increase her isosorbide to 60 mg daily.  Continue aspirin, clopidogrel, amlodipine, carvedilol, atorvastatin.  Follow-up with Dr. Burt Knack or me in 3 months (or sooner if symptoms change or Myoview abnormal).  2. Essential hypertension Blood pressure somewhat borderline.  Increase isosorbide to 60 mg daily as noted.  Continue current dose of amlodipine, carvedilol, valsartan/HCTZ.  3. Mixed hyperlipidemia Continue high intensity statin therapy.  She is fasting today.  Obtain c-Met, lipids.   Shared Decision Making/Informed Consent The risks [chest pain, shortness of breath, cardiac arrhythmias, dizziness, blood pressure fluctuations, myocardial infarction, stroke/transient ischemic attack, nausea, vomiting, allergic reaction, radiation exposure, metallic taste sensation and life-threatening complications (estimated to be 1 in 10,000)], benefits (risk stratification, diagnosing coronary artery disease, treatment guidance) and alternatives of a nuclear stress test were discussed in detail with Ms. Packett and she agrees to proceed.    Dispo:  Return in about 3 months (around 10/26/2020) for Routine Follow Up, w/ Dr. Burt Knack, or Richardson Dopp, PA-C.   Medication Adjustments/Labs and Tests Ordered: Current medicines are reviewed at length with the patient today.  Concerns regarding medicines are outlined above.  Tests Ordered: Orders Placed This Encounter  Procedures   Comp Met (CMET)   Lipid Profile   Cardiac Stress Test: Informed Consent Details: Physician/Practitioner Attestation; Transcribe to consent form and obtain patient signature   Myocardial Perfusion Imaging   EKG 12-Lead   Medication Changes: Meds ordered this encounter  Medications   isosorbide mononitrate (IMDUR) 60 MG 24 hr tablet    Sig: Take 1 tablet (60 mg total) by mouth daily.     Dispense:  90 tablet  Refill:  3    Dose increase    Danton Sewer, PA-C  07/26/2020 5:34 PM    [facility] [address], [place], [place]  [address] Phone: [phone]; Fax: [phone]

## 2020-07-26 ENCOUNTER — Encounter: Payer: Self-pay | Admitting: Physician Assistant

## 2020-07-26 ENCOUNTER — Other Ambulatory Visit: Payer: Self-pay

## 2020-07-26 ENCOUNTER — Ambulatory Visit (INDEPENDENT_AMBULATORY_CARE_PROVIDER_SITE_OTHER): Payer: Medicare Other | Admitting: Physician Assistant

## 2020-07-26 VITALS — BP 140/82 | HR 69 | Ht 64.0 in | Wt 200.4 lb

## 2020-07-26 DIAGNOSIS — R079 Chest pain, unspecified: Secondary | ICD-10-CM

## 2020-07-26 DIAGNOSIS — E782 Mixed hyperlipidemia: Secondary | ICD-10-CM

## 2020-07-26 DIAGNOSIS — I10 Essential (primary) hypertension: Secondary | ICD-10-CM

## 2020-07-26 DIAGNOSIS — I25119 Atherosclerotic heart disease of native coronary artery with unspecified angina pectoris: Secondary | ICD-10-CM

## 2020-07-26 LAB — COMPREHENSIVE METABOLIC PANEL
ALT: 25 IU/L (ref 0–32)
AST: 15 IU/L (ref 0–40)
Albumin/Globulin Ratio: 1.5 (ref 1.2–2.2)
Albumin: 4 g/dL (ref 3.7–4.7)
Alkaline Phosphatase: 85 IU/L (ref 44–121)
BUN/Creatinine Ratio: 17 (ref 12–28)
BUN: 15 mg/dL (ref 8–27)
Bilirubin Total: 0.5 mg/dL (ref 0.0–1.2)
CO2: 26 mmol/L (ref 20–29)
Calcium: 9.6 mg/dL (ref 8.7–10.3)
Chloride: 102 mmol/L (ref 96–106)
Creatinine, Ser: 0.88 mg/dL (ref 0.57–1.00)
Globulin, Total: 2.6 g/dL (ref 1.5–4.5)
Glucose: 115 mg/dL — ABNORMAL HIGH (ref 65–99)
Potassium: 4.1 mmol/L (ref 3.5–5.2)
Sodium: 142 mmol/L (ref 134–144)
Total Protein: 6.6 g/dL (ref 6.0–8.5)
eGFR: 70 mL/min/{1.73_m2} (ref >59)

## 2020-07-26 LAB — LIPID PANEL
Chol/HDL Ratio: 2.6 ratio (ref 0.0–4.4)
Cholesterol, Total: 103 mg/dL (ref 100–199)
HDL: 39 mg/dL — ABNORMAL LOW (ref >39)
LDL Chol Calc (NIH): 51 mg/dL (ref 0–99)
Triglycerides: 58 mg/dL (ref 0–149)
VLDL Cholesterol Cal: 13 mg/dL (ref 5–40)

## 2020-07-26 MED ORDER — ISOSORBIDE MONONITRATE ER 60 MG PO TB24: 60.0000 mg | ORAL_TABLET | Freq: Every day | ORAL | 3 refills | Status: AC

## 2020-07-26 NOTE — Patient Instructions (Addendum)
Medication Instructions:   Your physician has recommended you make the following change in your medication:   INCREASE Imdur one tablet by mouth ( 60 mg) daily.   *If you need a refill on your cardiac medications before your next appointment, please call your pharmacy*   Lab Work:  TODAY!!!!  CMET/LIPID  If you have labs (blood work) drawn today and your tests are completely normal, you will receive your results only by: [place] (if you have MyChart) OR A paper copy in the mail If you have any lab test that is abnormal or we need to change your treatment, we will call you to review the results.   Testing/Procedures: You are scheduled for a Myocardial Perfusion Imaging Study on Wednesday, July 6 at 7:45 am    .   Please arrive 15 minutes prior to your appointment time for registration and insurance purposes.   The test will take approximately 3 to 4 hours to complete; you may bring reading material. If someone comes with you to your appointment, they will need to remain in the main lobby due to limited space in the testing area.   How to prepare for your Myocardial Perfusion test:   Do not eat or drink 3 hours prior to your test, except you may have water.    Do not consume products containing caffeine (regular or decaffeinated) 12 hours prior to your test (ex: coffee, chocolate, soda, tea)   Do bring a list of your current medications with you. If not listed below, you may take your medications as normal.    Bring any held medication to your appointment, as you may be required to take it once the test is complete.   Do wear comfortable clothes (no dresses or overalls) and walking shoes. Tennis shoes are preferred. No heels or open toed shoes.  Do not wear perfume or lotions (deodorant is allowed).   If these instructions are not followed, you test will have to be rescheduled.   Please report to [address] for your test. If you have questions  or concerns about your appointment, please call the Nuclear Lab at [phone].  If you cannot keep your appointment, please provide 24 hour notification to the Nuclear lab to avoid a possible $50 charge to your account.     Follow-Up: At [facility], you and your health needs are our priority.  As part of our continuing mission to provide you with exceptional heart care, we have created designated Provider Care Teams.  These Care Teams include your primary Cardiologist (physician) and Advanced Practice Providers (APPs -  Physician Assistants and Nurse Practitioners) who all work together to provide you with the care you need, when you need it.  We recommend signing up for the patient portal called "MyChart".  Sign up information is provided on this After Visit Summary.  MyChart is used to connect with patients for Virtual Visits (Telemedicine).  Patients are able to view lab/test results, encounter notes, upcoming appointments, etc.  Non-urgent messages can be sent to your provider as well.   To learn more about what you can do with MyChart, go to [email or web address].    Your next appointment:   3 month(s) with Nicky Pugh, PA Tuesday, October 4 @ 10:15 am.  The format for your next appointment:   In Person  Provider:   Richardson Dopp, PA-C   Other Instructions  I

## 2020-07-28 ENCOUNTER — Encounter: Payer: Self-pay | Admitting: *Deleted

## 2020-08-02 ENCOUNTER — Telehealth (HOSPITAL_COMMUNITY): Payer: Self-pay | Admitting: *Deleted

## 2020-08-02 NOTE — Telephone Encounter (Signed)
Patient given detailed instructions per Myocardial Perfusion Study Information Sheet for the test on 08/09/20 Patient notified to arrive 15 minutes early and that it is imperative to arrive on time for appointment to keep from having the test rescheduled.  If you need to cancel or reschedule your appointment, please call the office within 24 hours of your appointment. . Patient verbalized understanding. Ajay Strubel Jacqueline   

## 2020-08-09 ENCOUNTER — Other Ambulatory Visit: Payer: Self-pay

## 2020-08-09 ENCOUNTER — Ambulatory Visit (HOSPITAL_COMMUNITY): Payer: Medicare Other | Attending: Cardiology

## 2020-08-09 DIAGNOSIS — R079 Chest pain, unspecified: Secondary | ICD-10-CM | POA: Diagnosis not present

## 2020-08-09 LAB — MYOCARDIAL PERFUSION IMAGING
LV dias vol: 91 mL (ref 46–106)
LV sys vol: 36 mL
Peak HR: 75 {beats}/min
Rest HR: 57 {beats}/min
SDS: 2
SRS: 0
SSS: 2
TID: 1.2

## 2020-08-09 MED ORDER — TECHNETIUM TC 99M TETROFOSMIN IV KIT
10.1000 | PACK | Freq: Once | INTRAVENOUS | Status: AC | PRN
  Administered 2020-08-09: 10.1 via INTRAVENOUS
  Filled 2020-08-09: qty 11

## 2020-08-09 MED ORDER — TECHNETIUM TC 99M TETROFOSMIN IV KIT
29.5000 | PACK | Freq: Once | INTRAVENOUS | Status: AC | PRN
  Administered 2020-08-09: 29.5 via INTRAVENOUS
  Filled 2020-08-09: qty 30

## 2020-08-09 MED ORDER — REGADENOSON 0.4 MG/5ML IV SOLN
0.4000 mg | Freq: Once | INTRAVENOUS | Status: AC
  Administered 2020-08-09: 0.4 mg via INTRAVENOUS

## 2020-08-10 ENCOUNTER — Encounter: Payer: Self-pay | Admitting: Physician Assistant

## 2020-08-11 ENCOUNTER — Telehealth: Payer: Self-pay | Admitting: Physician Assistant

## 2020-08-11 NOTE — Telephone Encounter (Signed)
Left message to call back  

## 2020-08-11 NOTE — Telephone Encounter (Signed)
Follow up:   Patient returning a call back.  

## 2020-08-14 NOTE — Telephone Encounter (Signed)
Follow up:     Patient returning a call back concering results.

## 2020-08-14 NOTE — Telephone Encounter (Signed)
Pt advised her 07/26/20 lab results and verbalize understanding.

## 2020-08-16 ENCOUNTER — Telehealth: Payer: Self-pay | Admitting: Internal Medicine

## 2020-08-16 ENCOUNTER — Other Ambulatory Visit: Payer: Self-pay

## 2020-08-16 ENCOUNTER — Ambulatory Visit (INDEPENDENT_AMBULATORY_CARE_PROVIDER_SITE_OTHER): Payer: Medicare Other | Admitting: Student

## 2020-08-16 ENCOUNTER — Other Ambulatory Visit (HOSPITAL_COMMUNITY)
Admission: RE | Admit: 2020-08-16 | Discharge: 2020-08-16 | Disposition: A | Discharge status: Home / Self Care | Payer: Medicare Other | Source: Ambulatory Visit | Attending: Internal Medicine | Admitting: Internal Medicine

## 2020-08-16 ENCOUNTER — Encounter: Payer: Self-pay | Admitting: Student

## 2020-08-16 VITALS — BP 147/75 | HR 65 | Temp 98.5°F | Ht 64.0 in | Wt 196.8 lb

## 2020-08-16 DIAGNOSIS — N390 Urinary tract infection, site not specified: Secondary | ICD-10-CM

## 2020-08-16 DIAGNOSIS — N898 Other specified noninflammatory disorders of vagina: Secondary | ICD-10-CM | POA: Insufficient documentation

## 2020-08-16 DIAGNOSIS — H0102B Squamous blepharitis left eye, upper and lower eyelids: Secondary | ICD-10-CM | POA: Diagnosis not present

## 2020-08-16 DIAGNOSIS — H2513 Age-related nuclear cataract, bilateral: Secondary | ICD-10-CM | POA: Diagnosis not present

## 2020-08-16 DIAGNOSIS — M545 Low back pain, unspecified: Secondary | ICD-10-CM

## 2020-08-16 DIAGNOSIS — H0102A Squamous blepharitis right eye, upper and lower eyelids: Secondary | ICD-10-CM | POA: Diagnosis not present

## 2020-08-16 DIAGNOSIS — R300 Dysuria: Secondary | ICD-10-CM

## 2020-08-16 DIAGNOSIS — N760 Acute vaginitis: Secondary | ICD-10-CM | POA: Diagnosis not present

## 2020-08-16 DIAGNOSIS — E119 Type 2 diabetes mellitus without complications: Secondary | ICD-10-CM | POA: Diagnosis not present

## 2020-08-16 DIAGNOSIS — H04123 Dry eye syndrome of bilateral lacrimal glands: Secondary | ICD-10-CM | POA: Diagnosis not present

## 2020-08-16 LAB — POCT URINALYSIS DIPSTICK
Bilirubin, UA: NEGATIVE
Glucose, UA: NEGATIVE
Ketones, UA: NEGATIVE
Nitrite, UA: NEGATIVE
Protein, UA: NEGATIVE
Spec Grav, UA: 1.015 (ref 1.010–1.025)
Urobilinogen, UA: 0.2 E.U./dL
pH, UA: 7 (ref 5.0–8.0)

## 2020-08-16 MED ORDER — SULFAMETHOXAZOLE-TRIMETHOPRIM 800-160 MG PO TABS: 1.0000 | ORAL_TABLET | Freq: Two times a day (BID) | ORAL | 0 refills | Status: AC

## 2020-08-16 NOTE — Patient Instructions (Signed)
[name], it was a pleasure seeing you today!  Today we discussed: - Back pain: I do not think this is related to your urinary symptoms. I believe this could possibly be a muscle strain. Use Tylenol and ice/warm packs on the area. It can take up to 6 weeks for this to improve.  - Pain with urination and vaginal discharge: I have prescribed an antibiotic for a possible urinary tract infection. Once we get the results from the vaginal swab, I will call you with the results. If medication is needed I will let you know.  I have ordered the following labs today:   Lab Orders  Culture, Urine  Urinalysis, Complete (81001)  POCT Urinalysis Dipstick (55974)    I have ordered the following medication/changed the following medications:   Start the following medications: Meds ordered this encounter  Medications   sulfamethoxazole-trimethoprim (BACTRIM DS) 800-160 MG tablet    Sig: Take 1 tablet by mouth 2 (two) times daily for 3 days.    Dispense:  6 tablet    Refill:  0     Follow-up:  if symptoms do not improve    Please make sure to arrive 15 minutes prior to your next appointment. If you arrive late, you may be asked to reschedule.   We look forward to seeing you next time. Please call our clinic at [phone] if you have any questions or concerns. The best time to call is Monday-Friday from 9am-4pm, but there is someone available 24/7. If after hours or the weekend, call the main hospital number and ask for the Internal Medicine Resident On-Call. If you need medication refills, please notify your pharmacy one week in advance and they will send [place] a request.  Thank you for letting us take part in your care. Wishing you the best!  Thank you, Sanjuan Dame, MD

## 2020-08-16 NOTE — Progress Notes (Signed)
   CC: dysuria, vaginal discharge  HPI:  [name] is a 75 y.o. with medical history as below presenting to [facility] for dysuria and vaginal discharge  Please see problem-based list for further details, assessments, and plans.  Past Medical History:  Diagnosis Date   Back pain    reason unknown   CAD (coronary artery disease)    a. 07/2014: DES to OM1  b. 02/2015: CTO to RCA // Myoview 7/22: EF 61, normal perfusion; low risk   Cervical lymphadenopathy    Cervical polyp    Coronary artery disease    Prior stent of the lCX with chronic total occlusion of a small RCA. Last cath in 2004 showing patent stent, nonobstructive disease and total RCA. EF is 65 to 70%. She has been managed medically.    Depression    sees a Social worker at [facility] but no meds   Diabetes mellitus without complication ([place])    takes Metformin daily   History of colon polyps    Hyperlipidemia    takes Crestor daily   Hypertension    takes Amlodipine,Coreg,and Prinzide daily   Hypothyroidism    takes Synthroid daily   Mild obstructive sleep apnea 10/16/2012   03/12/2006: Moderate Obstructive sleep apnea with a Respiratory Disturbance Index of 29 events per hour.  05/26/13 Mild obstructive sleep apnea/hypopnea syndrome, AHI 7.5 per hour with events in all positions, especially supine. REM AHI 25.9 per hour. Moderately loud snoring with oxygen desaturation to a nadir of 82% and mean oxygen saturation through the study of 92.3% on room air.  07/31/18: Sleep s   Neck mass    OSA (obstructive sleep apnea)    PT DENIES, STATES SHE WAS TOLD SHE DIDN'T HAVE OSA, NO CPAP   PTSD (post-traumatic stress disorder)    Renal artery stenosis ([place]) 2007   Stroke ([place])    TIA- LAST ONE GREATER THAN 10 YEARS PER PT   TIA (transient ischemic attack)    Tubular adenoma of rectum 49/70/2637   Umbilical hernia    Urinary tract infection    Review of Systems:  As per HPI  Physical Exam:  Vitals:   08/16/20 1513  08/16/20 1515  BP:  (!) 147/75  Pulse:  65  Temp:  98.5 F (36.9 C)  TempSrc:  Oral  SpO2:  100%  Weight: 196 lb 12.8 oz (89.3 kg)   Height: 5\' 4"  (1.626 m)    General: Resting comfortably in chair in no acute distress, non-toxic appearing CV: Regular rate, rhythm. No murmurs, rubs, gallops appreciated Pulm: Normal work of breathing on room air. Clear to ausculation bilaterally GU: Vulva appears normal, no lesions or bleeding visible. White discharge present in vaginal canal. Cervix appears normal without bleeding.  MSK: Normal bulk, tone. No pitting edema bilaterally. No spinal or paraspinal tenderness. No costovertebral tenderness. Skin: Warm, dry. No rashes or lesions appreciated Neuro: Awake, alert, answering questions appropriately. Psych: Normal mood, affect, speech  Assessment & Plan:   See Encounters Tab for problem based charting.  Patient discussed with Dr.  Jimmye Norman

## 2020-08-16 NOTE — Telephone Encounter (Signed)
Return pt's call - stated she's having lower back pain, brown urine, discharge for over a week; burning w/urination started yesterday. Stated she has been drinking plenty of water. Requesting an appt today. Appt schedule with Dr Collene Gobble @ 1415 PM.

## 2020-08-16 NOTE — Telephone Encounter (Signed)
Pt calling to report lower back pain, odor during urination, patient having d/c with brown urine x 1 week.  Please call the patient back.

## 2020-08-17 ENCOUNTER — Other Ambulatory Visit: Payer: Self-pay | Admitting: Student

## 2020-08-17 DIAGNOSIS — B9689 Other specified bacterial agents as the cause of diseases classified elsewhere: Secondary | ICD-10-CM

## 2020-08-17 LAB — CERVICOVAGINAL ANCILLARY ONLY
Bacterial Vaginitis (gardnerella): POSITIVE — AB
Candida Glabrata: NEGATIVE
Candida Vaginitis: NEGATIVE
Comment: NEGATIVE
Comment: NEGATIVE
Comment: NEGATIVE
Comment: NEGATIVE
Trichomonas: NEGATIVE

## 2020-08-17 LAB — URINALYSIS, COMPLETE
Bilirubin, UA: NEGATIVE
Glucose, UA: NEGATIVE
Ketones, UA: NEGATIVE
Nitrite, UA: NEGATIVE
Protein,UA: NEGATIVE
RBC, UA: NEGATIVE
Specific Gravity, UA: 1.009 (ref 1.005–1.030)
Urobilinogen, Ur: 0.2 mg/dL (ref 0.2–1.0)
pH, UA: 7 (ref 5.0–7.5)

## 2020-08-17 LAB — MICROSCOPIC EXAMINATION
Casts: NONE SEEN /lpf
Epithelial Cells (non renal): >10 /hpf — AB (ref 0–10)
WBC, UA: >30 /hpf — AB (ref 0–5)

## 2020-08-17 MED ORDER — METRONIDAZOLE 500 MG PO TABS: 500.0000 mg | ORAL_TABLET | Freq: Two times a day (BID) | ORAL | 0 refills | Status: AC

## 2020-08-18 DIAGNOSIS — R300 Dysuria: Secondary | ICD-10-CM | POA: Insufficient documentation

## 2020-08-18 DIAGNOSIS — M545 Low back pain, unspecified: Secondary | ICD-10-CM | POA: Insufficient documentation

## 2020-08-18 NOTE — Assessment & Plan Note (Signed)
[name] is presenting today to discuss vaginal discharge that has been occurring for over one week. She states last Monday when she was on the toilet she noticed copious amounts of white discharge. Since this time she mentions the discharge has continued, although not as heavy as initially. Notes that there has been some pruritis, denies malodor. She denies any recent sexual activity. [name] does mention that she has been using several different soaps recently in order to find the optimal soap for her skin.   Vaginal exam today revealed white discharge without any other obvious abnormalities. Wet mount collected, will follow-up with [name].  - Wet mount  ADDENDUM:  Wet mount revealed bacterial vaginosis. I have prescribed metronidazole 500mg  twice daily for 7 days. I have instructed [name] to return if symptoms persist or worsen

## 2020-08-18 NOTE — Assessment & Plan Note (Signed)
[name] also presents today to discuss dysuria that she has been experiencing since last Thursday. Mentions that her urine has turned darker, almost a brown color. She notes that it has been a long time since her last urinary tract infection. Denies fevers, chills, nausea, vomiting, abdominal pain. She does mention some bilateral lower back pain, although this appears to be unrelated (please see "lower back pain").  POC dipstick revealed 3+ leukocytes, 1+ blood, negative nitrates. Will empirically treat for urinary tract infection, unclear whether this is related to her vaginal discharge.  - Bactrim 800-160mg  twice daily x3d - UA, UCx collected - Return to clinic if symptoms persist

## 2020-08-18 NOTE — Assessment & Plan Note (Signed)
Ms. Druck states that she has been experiencing bilateral lower back pain for the past two weeks. She denies any injury/trauma or previous similar incident. Describes the pain as aching and worsening with movement. Denies any neurologic symptoms including focal weakness, numbness, or paresthesias in bilateral lower extremities. No red flag symptoms present.  On exam, no spinal or paraspinal tenderness present. Most likely symptoms represent lumbar muscular strain. I have discussed this with Ms. Shull, including that this could take 6 weeks to fully improve. I have given her instructions for conservative management.    - Alternate acetaminophen 500mg  q6h and ibuprofen 400mg  q6h as needed  - Ice/warm compress - OTC lidocaine patch if needed - Lumbar exercises given

## 2020-08-22 LAB — URINE CULTURE

## 2020-08-22 NOTE — Progress Notes (Signed)
Internal Medicine Clinic Attending ? ?Case discussed with Dr. Braswell  At the time of the visit.  We reviewed the resident?s history and exam and pertinent patient test results.  I agree with the assessment, diagnosis, and plan of care documented in the resident?s note.  ?

## 2020-08-25 ENCOUNTER — Other Ambulatory Visit: Payer: Self-pay | Admitting: Internal Medicine

## 2020-08-25 DIAGNOSIS — E038 Other specified hypothyroidism: Secondary | ICD-10-CM

## 2020-09-05 DIAGNOSIS — H2512 Age-related nuclear cataract, left eye: Secondary | ICD-10-CM | POA: Diagnosis not present

## 2020-09-22 ENCOUNTER — Telehealth: Payer: Self-pay | Admitting: Internal Medicine

## 2020-09-22 NOTE — Telephone Encounter (Signed)
  [place] LINE DOCUMENTATION  Patient name: [name]  DOB: 12/12/1945 MRN: [id number]  Reason for call: + COVID test   She reports having tested herself for COVID yesterday, after experiencing a headache, fever/chills, cough, and mild shortness of breath over the past day. COVID test returned positive.  She has received all 4 COVID vaccines. She denies chest pain, dizziness or lightheadedness.  She has an oximeter at home and will try to get that working to see what her oxygen saturations are. Assessment/Plan   Symptomatic COVID 19 infection No treatment indicated in the setting of COVID 19 vaccinations.  Recommend symptomatic treatment and isolation per guideline recommendations.  Recommend she seek further evaluation in the urgent care vs ED for the shortness of breath or if oximeter reading is <92%.  Message left for our front desk to call to arrange a follow up visit once outside the isolation window.  As always, patients are encouraged to seek further evaluation in the ED or urgent care if symptoms worsen prior to being seen in the clinic. Patients are encouraged to use this line for urgent needs only and advice does not replace a formal evaluation by a medical provider. If symptoms are considered life threatening, they are encouraged to call 911. For all other non-urgent needs, they are encouraged to call between regular clinic hours.  Mitzi Hansen, MD Internal Medicine Resident PGY-3 [facility] Pager: [phone] 09/22/2020 4:17 PM

## 2020-09-23 ENCOUNTER — Emergency Department (HOSPITAL_COMMUNITY): Payer: Medicare Other

## 2020-09-23 ENCOUNTER — Emergency Department (HOSPITAL_COMMUNITY)
Admission: EM | Admit: 2020-09-23 | Discharge: 2020-10-05 | Disposition: E | Payer: Medicare Other | Attending: Emergency Medicine | Admitting: Emergency Medicine

## 2020-09-23 ENCOUNTER — Encounter (HOSPITAL_COMMUNITY): Payer: Self-pay

## 2020-09-23 DIAGNOSIS — Z7902 Long term (current) use of antithrombotics/antiplatelets: Secondary | ICD-10-CM | POA: Insufficient documentation

## 2020-09-23 DIAGNOSIS — R4020 Unspecified coma: Secondary | ICD-10-CM | POA: Diagnosis not present

## 2020-09-23 DIAGNOSIS — I469 Cardiac arrest, cause unspecified: Secondary | ICD-10-CM

## 2020-09-23 DIAGNOSIS — Z79899 Other long term (current) drug therapy: Secondary | ICD-10-CM | POA: Diagnosis not present

## 2020-09-23 DIAGNOSIS — Z87891 Personal history of nicotine dependence: Secondary | ICD-10-CM | POA: Diagnosis not present

## 2020-09-23 DIAGNOSIS — I499 Cardiac arrhythmia, unspecified: Secondary | ICD-10-CM | POA: Diagnosis not present

## 2020-09-23 DIAGNOSIS — I10 Essential (primary) hypertension: Secondary | ICD-10-CM | POA: Insufficient documentation

## 2020-09-23 DIAGNOSIS — R404 Transient alteration of awareness: Secondary | ICD-10-CM | POA: Diagnosis not present

## 2020-09-23 DIAGNOSIS — E119 Type 2 diabetes mellitus without complications: Secondary | ICD-10-CM | POA: Insufficient documentation

## 2020-09-23 DIAGNOSIS — Z7984 Long term (current) use of oral hypoglycemic drugs: Secondary | ICD-10-CM | POA: Insufficient documentation

## 2020-09-23 DIAGNOSIS — E039 Hypothyroidism, unspecified: Secondary | ICD-10-CM | POA: Diagnosis not present

## 2020-09-23 DIAGNOSIS — Z743 Need for continuous supervision: Secondary | ICD-10-CM | POA: Diagnosis not present

## 2020-09-23 DIAGNOSIS — Z7982 Long term (current) use of aspirin: Secondary | ICD-10-CM | POA: Diagnosis not present

## 2020-09-23 DIAGNOSIS — U071 COVID-19: Secondary | ICD-10-CM | POA: Insufficient documentation

## 2020-09-23 DIAGNOSIS — I25118 Atherosclerotic heart disease of native coronary artery with other forms of angina pectoris: Secondary | ICD-10-CM | POA: Insufficient documentation

## 2020-09-23 DIAGNOSIS — R0689 Other abnormalities of breathing: Secondary | ICD-10-CM | POA: Diagnosis not present

## 2020-09-23 LAB — I-STAT CHEM 8, ED
BUN: 15 mg/dL (ref 8–23)
Calcium, Ion: 1.02 mmol/L — ABNORMAL LOW (ref 1.15–1.40)
Chloride: 106 mmol/L (ref 98–111)
Creatinine, Ser: 1.5 mg/dL — ABNORMAL HIGH (ref 0.44–1.00)
Glucose, Bld: 213 mg/dL — ABNORMAL HIGH (ref 70–99)
HCT: 38 % (ref 36.0–46.0)
Hemoglobin: 12.9 g/dL (ref 12.0–15.0)
Potassium: 4.9 mmol/L (ref 3.5–5.1)
Sodium: 140 mmol/L (ref 135–145)
TCO2: 20 mmol/L — ABNORMAL LOW (ref 22–32)

## 2020-09-23 MED ORDER — EPINEPHRINE HCL 5 MG/250ML IV SOLN IN NS
0.5000 ug/min | INTRAVENOUS | Status: DC
  Administered 2020-09-23: 9 ug/min via INTRAVENOUS

## 2020-09-23 MED ORDER — SODIUM CHLORIDE 0.9 % IV BOLUS
1000.0000 mL | Freq: Once | INTRAVENOUS | Status: AC
  Administered 2020-09-23: 1000 mL via INTRAVENOUS

## 2020-09-23 MED ORDER — EPINEPHRINE 0.1 MG/10ML (10 MCG/ML) SYRINGE FOR IV PUSH (FOR BLOOD PRESSURE SUPPORT)
PREFILLED_SYRINGE | INTRAVENOUS | Status: AC | PRN
  Administered 2020-09-23: 5 ug via INTRAVENOUS

## 2020-09-23 MED ORDER — EPINEPHRINE 1 MG/10ML IJ SOSY
PREFILLED_SYRINGE | INTRAMUSCULAR | Status: AC | PRN
  Administered 2020-09-23 (×4): 1 mg via INTRAVENOUS

## 2020-09-23 MED ORDER — SODIUM BICARBONATE 8.4 % IV SOLN
INTRAVENOUS | Status: AC | PRN
  Administered 2020-09-23: 50 meq via INTRAVENOUS

## 2020-10-05 NOTE — Progress Notes (Signed)
   10-12-20 1110  Clinical Encounter Type  Visited With Family  Visit Type Initial;Spiritual support;Social support;Death  Referral From Nurse;Family  Spiritual Encounters  Spiritual Needs Emotional;Grief support   CH paged to provide support to pt.'s grandson and niece who just received word of pt.'s passing.  Niece in family consult room acutely distraught but grieving appropriately.  She says she lost her mother earlier this year and that her aunt was the last of her mother's siblings she is close with.  Family requested prayer before leaving ED to call other family members.  CH gave niece and grandson patient placement card with instructions to call whenever they have chosen a funeral home.

## 2020-10-05 NOTE — ED Provider Notes (Signed)
[facility] EMERGENCY DEPARTMENT Provider Note   CSN: [id number] Arrival date & time: 2020/10/22  [id number]     History Chief Complaint  Patient presents with   Cardiac Arrest    [name] is a 75 y.o. female.  Pt presents to the ED today as a full arrest.  Pt was diagnosed with Covid 2 days ago.  She called EMS today as a fall.  She was found by EMS on the floor.  She was diaphoretic and had vomited. She had a syncopal event and was brady.  She proceeded to arrest in the back of the EMS truck at [phone].  She was intubated with a king airway.  She received 4 rounds of epi.  She did have periodic ROSC after epi, but she would lose pulses again.        Past Medical History:  Diagnosis Date   Back pain    reason unknown   CAD (coronary artery disease)    a. 07/2014: DES to OM1  b. 02/2015: CTO to RCA // Myoview 7/22: EF 61, normal perfusion; low risk   Cervical lymphadenopathy    Cervical polyp    Coronary artery disease    Prior stent of the lCX with chronic total occlusion of a small RCA. Last cath in 2004 showing patent stent, nonobstructive disease and total RCA. EF is 65 to 70%. She has been managed medically.    Depression    sees a Social worker at [facility] but no meds   Diabetes mellitus without complication ([place])    takes Metformin daily   History of colon polyps    Hyperlipidemia    takes Crestor daily   Hypertension    takes Amlodipine,Coreg,and Prinzide daily   Hypothyroidism    takes Synthroid daily   Mild obstructive sleep apnea 10/16/2012   03/12/2006: Moderate Obstructive sleep apnea with a Respiratory Disturbance Index of 29 events per hour.  05/26/13 Mild obstructive sleep apnea/hypopnea syndrome, AHI 7.5 per hour with events in all positions, especially supine. REM AHI 25.9 per hour. Moderately loud snoring with oxygen desaturation to a nadir of 82% and mean oxygen saturation through the study of 92.3% on room air.  07/31/18: Sleep s   Neck  mass    OSA (obstructive sleep apnea)    PT DENIES, STATES SHE WAS TOLD SHE DIDN'T HAVE OSA, NO CPAP   PTSD (post-traumatic stress disorder)    Renal artery stenosis ([place]) 2007   Stroke ([place])    TIA- LAST ONE GREATER THAN 10 YEARS PER PT   TIA (transient ischemic attack)    Tubular adenoma of rectum [id number]   Umbilical hernia    Urinary tract infection     Patient Active Problem List   Diagnosis Date Noted   Dysuria 08/18/2020   Bilateral low back pain 08/18/2020   Abnormal intentional weight loss 05/18/2020   Dental caries 08/17/2018   Morbid obesity ([place]) 01/22/2017   Chronic fatigue 10/04/2016   Allergic rhinitis 05/02/2016   Cutaneous skin tags 01/26/2015   Skin lesion of right lower extremity 01/26/2015   S/P coronary artery stent placement, OM 07/18/14 07/19/2014   Onychomycosis 11/25/2013   Primary snoring 10/16/2012   History of umbilical hernia repair [id number]   DM (diabetes mellitus), type 2 with peripheral vascular complications ([place]) [id number]   Preventative health care 12/18/2010   CYSTOCELE WITHOUT MENTION UTERINE PROLAPSE MIDLN [id number]   Vaginal discharge 05/04/2008   Hypothyroidism 12/14/2005   Hyperlipidemia 12/14/2005  Essential hypertension 12/14/2005   Coronary artery disease involving native coronary artery of native heart with angina pectoris ([place]) 12/14/2005   RENAL ARTERY STENOSIS 12/14/2005   CEREBROVASCULAR ACCIDENT, HX OF 12/14/2005   History of colonic polyps 10/24/2004    Past Surgical History:  Procedure Laterality Date   ANKLE SURGERY Left    plates and screws   APPENDECTOMY     CARDIAC CATHETERIZATION  2005/2006/2007/2010   CARDIAC CATHETERIZATION N/A 07/18/2014   Procedure: Left Heart Cath and Coronary Angiography;  Surgeon: Lorretta Harp, MD; LAD patent, RCA 100% with L-R collaterals, OM1 80% stenosed proximal to previously placed stent, 30% ISR, EF 55%   CARDIAC CATHETERIZATION N/A 02/13/2015   Procedure: Left Heart Cath  and Coronary Angiography;  Surgeon: Sherren Mocha, MD;  Location: [place] [facility];  Service: Cardiovascular;  Laterality: N/A;   CARDIAC CATHETERIZATION Right 02/13/2015   Procedure: Coronary Stent Intervention;  Surgeon: Sherren Mocha, MD;  Location: [place] [facility];  Service: Cardiovascular;  Laterality: Right;   CARDIAC CATHETERIZATION N/A 07/10/2015   Procedure: Left Heart Cath and Coronary Angiography;  Surgeon: Leonie Man, MD;  Location: [place] [facility];  Service: Cardiovascular;  Laterality: N/A;   COLONOSCOPY  10/2004   CORONARY ANGIOPLASTY     x 1   CORONARY STENT PLACEMENT  1998   NIR 3.0 x 25 to the [place]  07/18/2014   SYNERGY DES 2.5X12 to the [place] N/A 05/27/2013   Procedure: INSERTION OF MESH;  Surgeon: Imogene Burn. Georgette Dover, MD;  Location: [facility];  Service: General;  Laterality: N/A;   PERCUTANEOUS CORONARY STENT INTERVENTION (PCI-S)  02/13/2015   des  to proximal circumflex   RENAL ARTERY STENT Bilateral 2007   Dr. Albertine Patricia   UMBILICAL HERNIA REPAIR     UMBILICAL HERNIA REPAIR N/A 05/27/2013   Procedure: HERNIA REPAIR UMBILICAL ADULT;  Surgeon: Imogene Burn. Georgette Dover, MD;  Location: [facility];  Service: General;  Laterality: N/A;     OB History   No obstetric history on file.     Family History  Problem Relation Age of Onset   Hypertension Mother    Osteoarthritis Mother    Alzheimer's disease Father    Heart attack Maternal Grandfather    Cancer Sister    Cancer Brother    Diabetes Brother    Stroke Neg Hx    Colon cancer Neg Hx    Esophageal cancer Neg Hx    Stomach cancer Neg Hx    Rectal cancer Neg Hx     Social History   Tobacco Use   Smoking status: Former   Smokeless tobacco: Never   Tobacco comments:    quit smoking about 30 yrs ago  Vaping Use   Vaping Use: Never used  Substance Use Topics   Alcohol use: Yes    Alcohol/week: 0.0 standard drinks    Comment: Wine on special occasions   Drug use: No     Home Medications Prior to Admission medications   Medication Sig Start Date End Date Taking? Authorizing Provider  levothyroxine (SYNTHROID) 100 MCG tablet TAKE 1 TABLET BY MOUTH ONCE DAILY BEFORE BREAKFAST 08/25/20   Lucious Groves, DO  amLODipine (NORVASC) 5 MG tablet Take 1 tablet (5 mg total) by mouth daily. 02/29/20 02/28/21  Lucious Groves, DO  aspirin 81 MG tablet Take 1 tablet (81 mg total) by mouth daily. 07/19/14   Barrett, Evelene Croon, PA-C  atorvastatin (LIPITOR) 80 MG tablet Take 1 tablet (80 mg total) by mouth daily. 02/29/20   Lucious Groves, DO  carvedilol (COREG) 12.5 MG tablet Take 1 tablet (12.5 mg total) by mouth 2 (two) times daily with a meal. 02/29/20   Lucious Groves, DO  clopidogrel (PLAVIX) 75 MG tablet Take 1 tablet (75 mg total) by mouth daily. Please make overdue appt with Dr. Burt Knack before anymore refills. Thank you 1st attempt 02/25/20   Sherren Mocha, MD  EQ ALLERGY RELIEF 10 MG tablet TAKE 1 TABLET BY MOUTH ONCE DAILY 11/03/17   Lucious Groves, DO  fluticasone (FLONASE) 50 MCG/ACT nasal spray Place 1 spray into both nostrils daily. 01/22/18 07/26/20  Lucious Groves, DO  isosorbide mononitrate (IMDUR) 60 MG 24 hr tablet Take 1 tablet (60 mg total) by mouth daily. 07/26/20 07/26/21  Richardson Dopp T, PA-C  metFORMIN (GLUCOPHAGE-XR) 500 MG 24 hr tablet TAKE TWO TABLETS BY MOUTH ONCE DAILY WITH  BREAKFAST 06/20/20   Lucious Groves, DO  neomycin-polymyxin-hydrocortisone (CORTISPORIN) OTIC solution Apply 1-2 drops to toe after soaking twice a day 05/03/19   Wallene Huh, DPM  nitroGLYCERIN (NITROSTAT) 0.4 MG SL tablet Place 1 tablet (0.4 mg total) under the tongue every 5 (five) minutes as needed for chest pain. DO NOT EXCEED A TOTAL OF 3 DOSES IN 15 MINUTES. Please make overdue appt. Thanks 1st attempt 02/25/20   Sherren Mocha, MD  potassium chloride SA (KLOR-CON) 20 MEQ tablet Take 1 tablet by mouth once daily 02/25/20   Lucious Groves, DO  valsartan-hydrochlorothiazide  (DIOVAN-HCT) 320-25 MG tablet Take 1 tablet by mouth daily. 02/29/20   Lucious Groves, DO    Allergies    Patient has no known allergies.  Review of Systems   Review of Systems  Unable to perform ROS: Patient unresponsive   Physical Exam Updated Vital Signs BP (!) 115/17   Resp (!) 0   Ht '5\' 4"'$  (1.626 m)   Wt 89.3 kg   BMI 33.79 kg/m   Physical Exam Vitals and nursing note reviewed.  Constitutional:      Appearance: She is obese.     Interventions: She is intubated.  HENT:     Head: Normocephalic.     Right Ear: External ear normal.     Nose: Nose normal.     Mouth/Throat:     Mouth: Mucous membranes are moist.     Pharynx: Oropharynx is clear.  Eyes:     Comments: Pupils are unresponsive  Neck:     Trachea: Trachea normal.  Cardiovascular:     Comments: No HR Pulmonary:     Effort: She is intubated.     Comments: No spontaneous breaths Abdominal:     General: Abdomen is flat.  Musculoskeletal:        General: Normal range of motion.  Skin:    General: Skin is warm.     Capillary Refill: Capillary refill takes more than 3 seconds.  Neurological:     Mental Status: She is unresponsive.    ED Results / Procedures / Treatments   Labs (all labs ordered are listed, but only abnormal results are displayed) Labs Reviewed  I-STAT CHEM 8, ED - Abnormal; Notable for the following components:      Result Value   Creatinine, Ser 1.50 (*)    Glucose, Bld 213 (*)    Calcium, Ion 1.02 (*)    TCO2 20 (*)    All other components  within normal limits  RESP PANEL BY RT-PCR (FLU A&B, COVID) [id number]  BASIC METABOLIC PANEL  CBC  I-STAT ARTERIAL BLOOD GAS, ED  TROPONIN I (HIGH SENSITIVITY)    EKG None  Radiology No results found.  Procedures Procedures   Medications Ordered in ED Medications  EPINEPHrine (ADRENALIN) 5 mg in NS 250 mL (0.02 mg/mL) premix infusion (0 mcg/min Intravenous Stopped 10-23-20 1023)  EPINEPHrine (ADRENALIN) 1 MG/10ML injection (1 mg  Intravenous Given 10-23-20 1015)  sodium bicarbonate injection (50 mEq Intravenous Given 2020/10/23 0958)  sodium chloride 0.9 % bolus 1,000 mL (0 mLs Intravenous Stopped 2020/10/23 1020)  EPINEPhrine 10 mcg/mL Adult IV Push Syringe (For Blood Pressure Support) (5 mcg Intravenous Given 2020/10/23 1011)    ED Course  I have reviewed the triage vital signs and the nursing notes.  Pertinent labs & imaging results that were available during my care of the patient were reviewed by me and considered in my medical decision making (see chart for details).    MDM Rules/Calculators/A&P                          CPR continued.  Airway changed out with ETT.  Multiple rounds of epi given.  HR would return, but then would lose pulses again.  No neurological activity noted.  Pupils fixed and dilated.  Code had been going on for over an hour.  I determined it was futile to continue to give epi, so the code was stopped.  TOD 1051.  Pt's niece (closest relative here in [place]) notified.  Pt d/w IMTS resident.  Death cert to go to Dr. Bridgett Larsson clinic.  CRITICAL CARE Performed by: Isla Pence   Total critical care time: 60 minutes  Critical care time was exclusive of separately billable procedures and treating other patients.  Critical care was necessary to treat or prevent imminent or life-threatening deterioration.  Critical care was time spent personally by me on the following activities: development of treatment plan with patient and/or surrogate as well as nursing, discussions with consultants, evaluation of patient's response to treatment, examination of patient, obtaining history from patient or surrogate, ordering and performing treatments and interventions, ordering and review of laboratory studies, ordering and review of radiographic studies, pulse oximetry and re-evaluation of patient's condition.   Melanie Luisa Zens was evaluated in Emergency Department on October 23, 2020 for the symptoms described in  the history of present illness. She was evaluated in the context of the global COVID-19 pandemic, which necessitated consideration that the patient might be at risk for infection with the SARS-CoV-2 virus that causes COVID-19. Institutional protocols and algorithms that pertain to the evaluation of patients at risk for COVID-19 are in a state of rapid change based on information released by regulatory bodies including the CDC and federal and state organizations. These policies and algorithms were followed during the patient's care in the ED.  Final Clinical Impression(s) / ED Diagnoses Final diagnoses:  Cardiac arrest [facility])  COVID-19    Rx / [place] Orders ED Discharge Orders     None        Isla Pence, MD Oct 23, 2020 1132

## 2020-10-05 NOTE — Code Documentation (Signed)
No pulses, CPR restarted

## 2020-10-05 NOTE — Code Documentation (Signed)
Pulse check: palpable pulses

## 2020-10-05 NOTE — ED Provider Notes (Signed)
  ED Course/Procedures     Procedure Name: Intubation Date/Time: 16-Oct-2020 9:55 AM Performed by: Amaryllis Dyke, PA-C Pre-anesthesia Checklist: Patient identified, Patient being monitored, Emergency Drugs available, Timeout performed and Suction available Oxygen Delivery Method: Non-rebreather mask Preoxygenation: Pre-oxygenation with 100% oxygen Induction Type: Rapid sequence Ventilation: Mask ventilation without difficulty Laryngoscope Size: Glidescope and 3 Tube size: 7.5 mm Number of attempts: 1 Airway Equipment and Method: Rigid stylet Placement Confirmation: ETT inserted through vocal cords under direct vision, CO2 detector and Breath sounds checked- equal and bilateral Secured at: 25 (@ the lip) cm Dental Injury: Teeth and Oropharynx as per pre-operative assessment     Attending @ bedside    Amaryllis Dyke, PA-C 10/16/2020 1508    Isla Pence, MD 09/24/20 [phone]

## 2020-10-05 NOTE — ED Triage Notes (Addendum)
Pt from home with [facility], called out for a fall. When ems arrived pt was lying in floor, pale, diaphoretic, vomited. Pt helped to the stretcher and had a witnessed syncopal episode. Pt initially brady in the 40s, pacing started. Pulses then lost,  CPR started at [place] CPR restarted 0942 Arrived to ED at 0947 CPR in progress  Pt took a home COVID test 2 days ago that was positive

## 2020-10-05 NOTE — ED Notes (Signed)
Time of death called by Dr Gilford Raid at [phone]

## 2020-10-05 NOTE — Code Documentation (Signed)
Pulse check, palpable pulses

## 2020-10-05 NOTE — Code Documentation (Signed)
Pt taken off vent, verbal by EDP

## 2020-10-05 NOTE — Code Documentation (Signed)
Faint pulse palpated, EDP at bedside, verbal to stop Epi gtt

## 2020-10-05 NOTE — Code Documentation (Signed)
Pt arrives with ems, CPR in progress

## 2020-10-05 NOTE — Code Documentation (Signed)
Pt intubated

## 2020-10-05 DEATH — deceased

## 2020-11-07 ENCOUNTER — Ambulatory Visit: Payer: Medicare Other | Admitting: Physician Assistant

## 2023-02-14 IMAGING — MG DIGITAL DIAGNOSTIC BILAT W/ TOMO W/ CAD
6 of 10 series · 6 of 30 positions shown · non-contrast
Comparison: Previous exam(s).

CLINICAL DATA: Patient presents complaining diffuse left breast
pain.

EXAM:
DIGITAL DIAGNOSTIC BILATERAL MAMMOGRAM WITH TOMOSYNTHESIS AND CAD
TECHNIQUE: Bilateral digital diagnostic mammography and breast tomosynthesis
was performed. The images were evaluated with computer-aided
detection.

[L CC synth-2D]
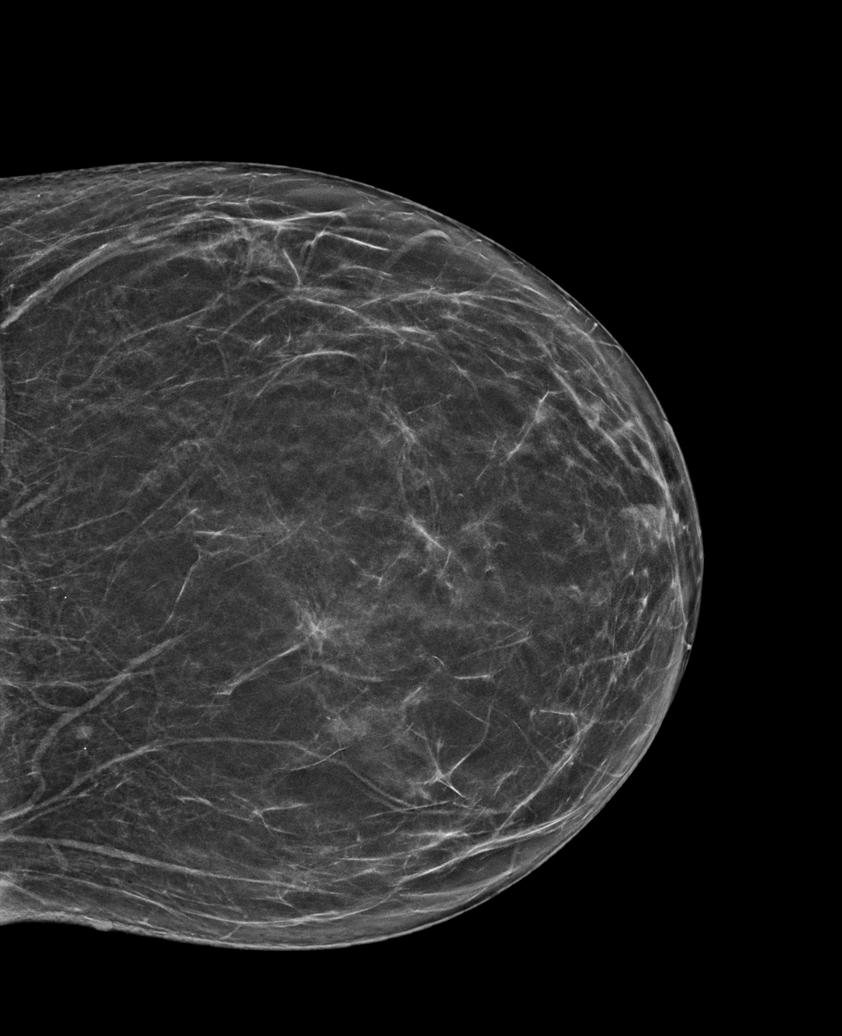

[R CC synth-2D (1 of 2)]
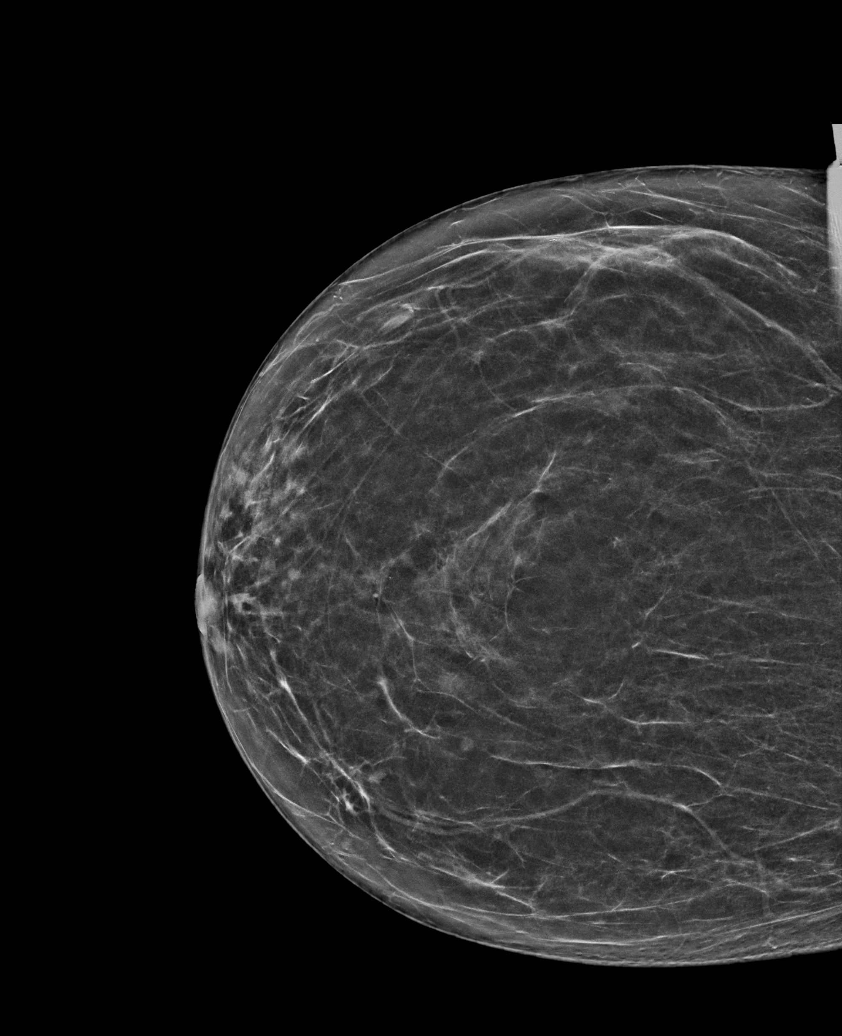

[R MLO synth-2D]
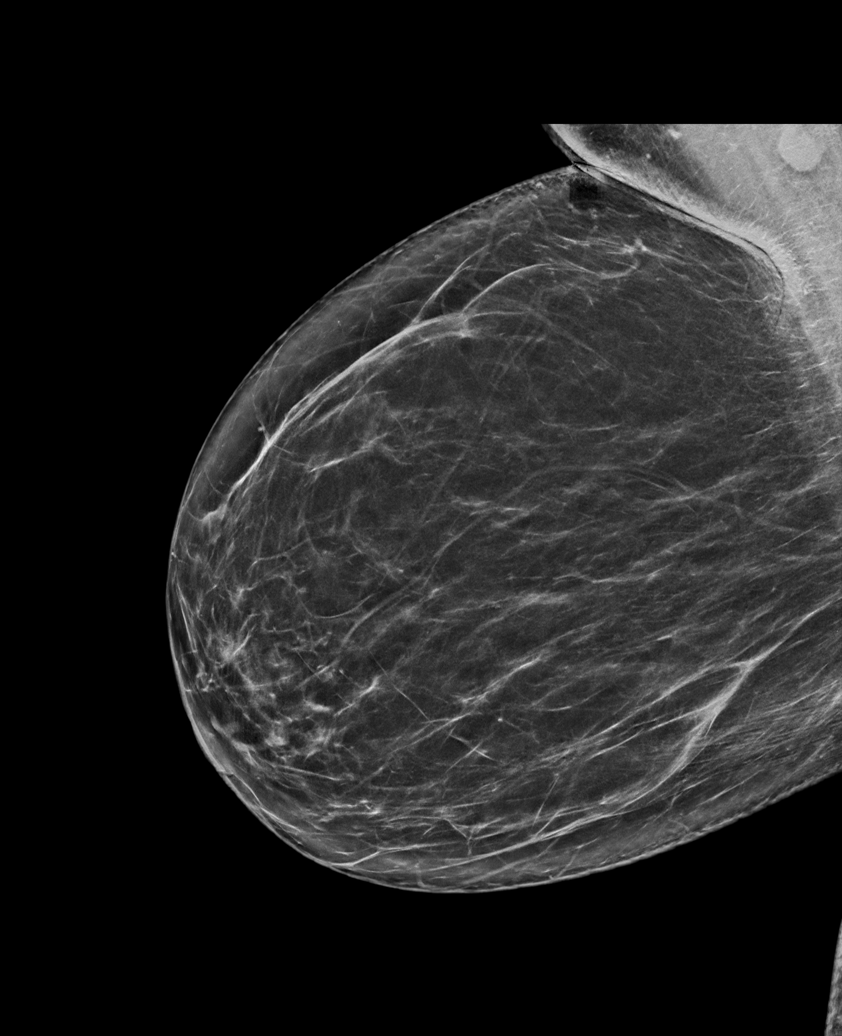

[L MLO synth-2D]
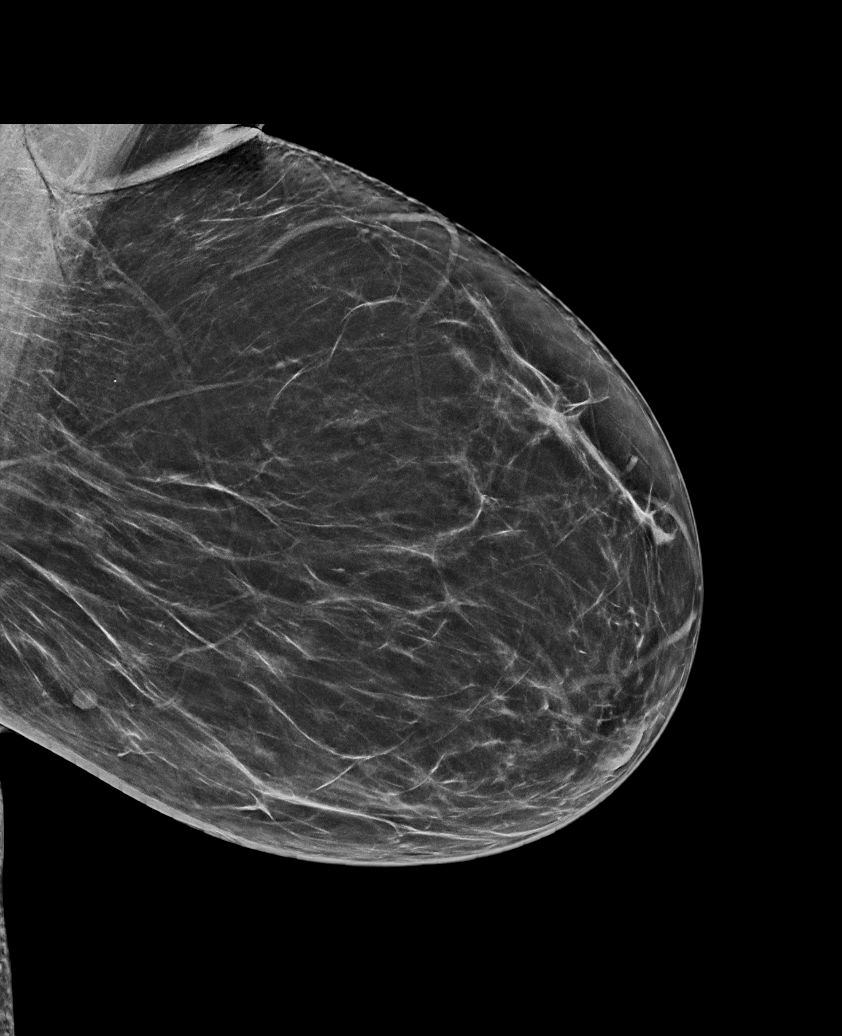

[R CC synth-2D (2 of 2)]
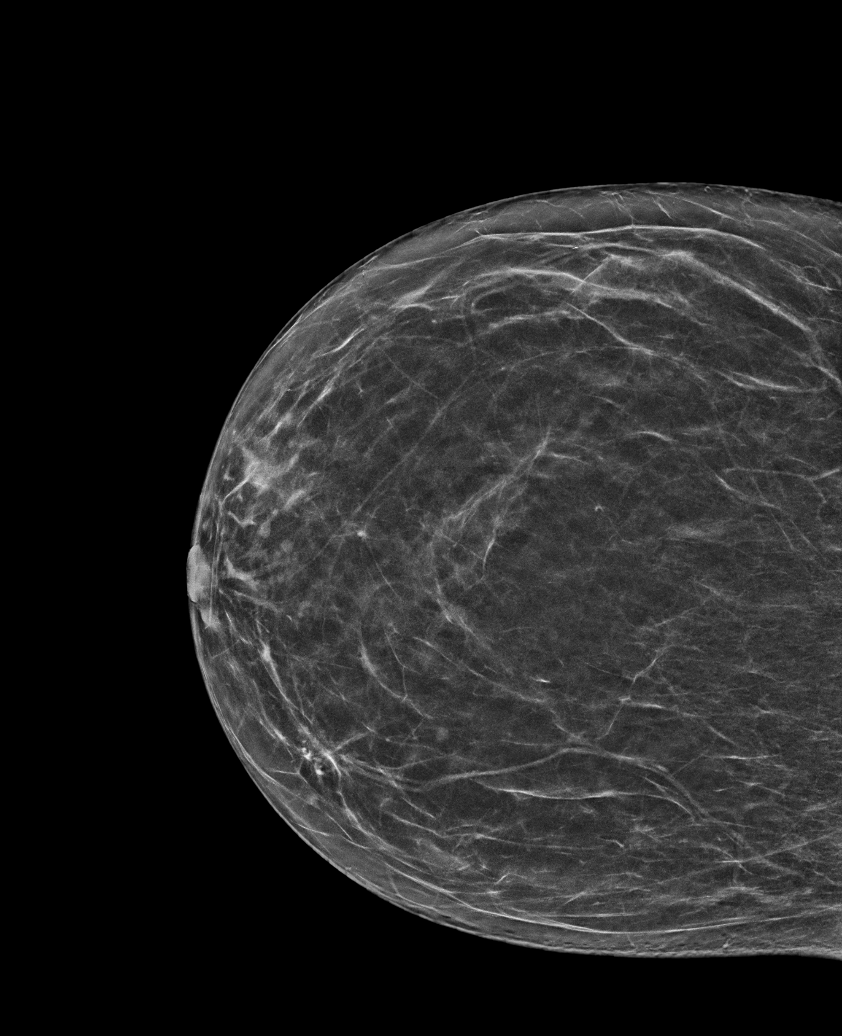

[R MLO tomo · tomo slice 40/79.0]
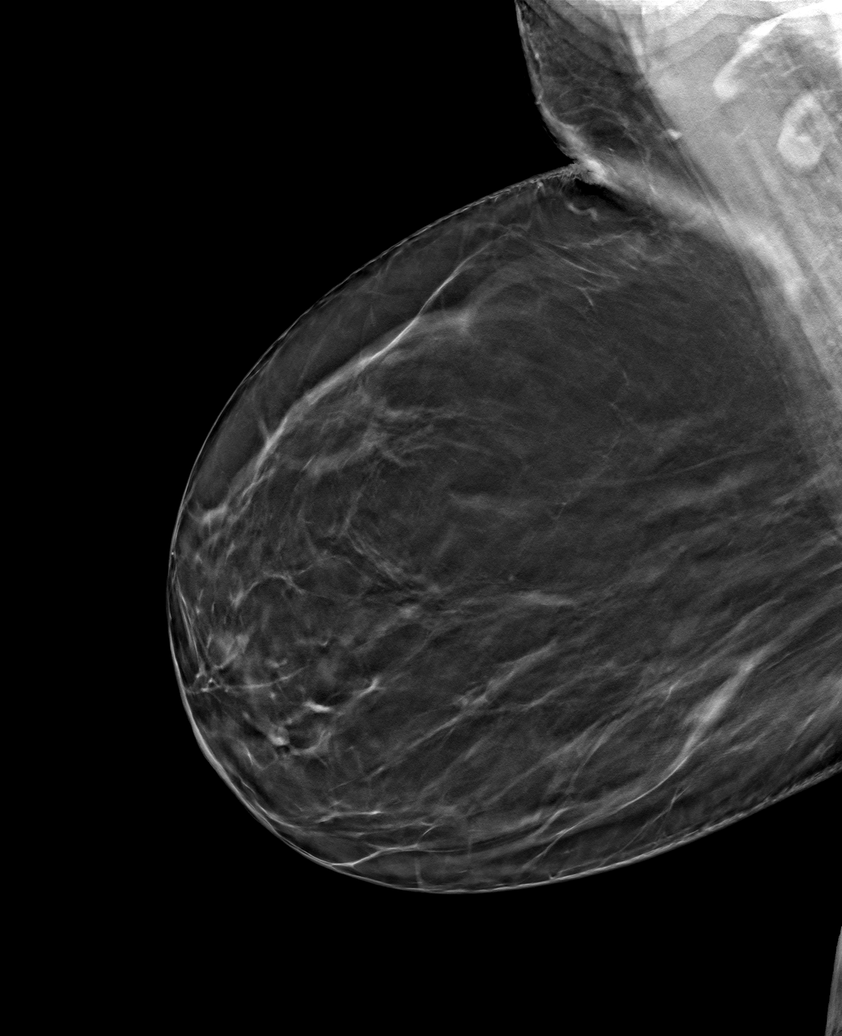

[6 of 30 positions shown; findings below may reference images not displayed]

ACR Breast Density Category b: There are scattered areas of
fibroglandular density.
FINDINGS: There are no masses, areas of architectural distortion, areas of
significant asymmetry or suspicious calcifications. No mammographic
change.
IMPRESSION: Negative exam.  No evidence of breast malignancy.

RECOMMENDATION:
Screening mammogram in one year.(Code:14-D-WHN)

I have discussed the findings and recommendations with the patient.
If applicable, a reminder letter will be sent to the patient
regarding the next appointment.

BI-RADS CATEGORY  1: Negative.
